# Patient Record
Sex: Male | Born: 1945 | Race: White | Hispanic: No | Marital: Married | Smoking: Never smoker
Health system: Southern US, Community
[De-identification: ages and names within clinical notes are randomized; demographics above are authoritative.]

## PROBLEM LIST (undated history)

## (undated) DIAGNOSIS — T4145XA Adverse effect of unspecified anesthetic, initial encounter: Secondary | ICD-10-CM

## (undated) DIAGNOSIS — K219 Gastro-esophageal reflux disease without esophagitis: Secondary | ICD-10-CM

## (undated) DIAGNOSIS — T8859XA Other complications of anesthesia, initial encounter: Secondary | ICD-10-CM

## (undated) DIAGNOSIS — G8929 Other chronic pain: Secondary | ICD-10-CM

## (undated) DIAGNOSIS — M542 Cervicalgia: Secondary | ICD-10-CM

## (undated) DIAGNOSIS — R519 Headache, unspecified: Secondary | ICD-10-CM

## (undated) DIAGNOSIS — R1032 Left lower quadrant pain: Secondary | ICD-10-CM

## (undated) DIAGNOSIS — K449 Diaphragmatic hernia without obstruction or gangrene: Secondary | ICD-10-CM

## (undated) DIAGNOSIS — Z9889 Other specified postprocedural states: Secondary | ICD-10-CM

## (undated) DIAGNOSIS — L570 Actinic keratosis: Secondary | ICD-10-CM

## (undated) DIAGNOSIS — K760 Fatty (change of) liver, not elsewhere classified: Secondary | ICD-10-CM

## (undated) DIAGNOSIS — G4730 Sleep apnea, unspecified: Secondary | ICD-10-CM

## (undated) DIAGNOSIS — I4891 Unspecified atrial fibrillation: Secondary | ICD-10-CM

## (undated) DIAGNOSIS — J302 Other seasonal allergic rhinitis: Secondary | ICD-10-CM

## (undated) DIAGNOSIS — C4492 Squamous cell carcinoma of skin, unspecified: Secondary | ICD-10-CM

## (undated) DIAGNOSIS — T7840XA Allergy, unspecified, initial encounter: Secondary | ICD-10-CM

## (undated) DIAGNOSIS — M1990 Unspecified osteoarthritis, unspecified site: Secondary | ICD-10-CM

## (undated) DIAGNOSIS — R112 Nausea with vomiting, unspecified: Secondary | ICD-10-CM

## (undated) HISTORY — DX: Diaphragmatic hernia without obstruction or gangrene: K44.9

## (undated) HISTORY — DX: Other chronic pain: G89.29

## (undated) HISTORY — DX: Fatty (change of) liver, not elsewhere classified: K76.0

## (undated) HISTORY — PX: ESOPHAGOGASTRODUODENOSCOPY: SHX1529

## (undated) HISTORY — DX: Gastro-esophageal reflux disease without esophagitis: K21.9

## (undated) HISTORY — PX: COLONOSCOPY: SHX174

## (undated) HISTORY — DX: Left lower quadrant pain: R10.32

## (undated) HISTORY — DX: Unspecified osteoarthritis, unspecified site: M19.90

## (undated) HISTORY — DX: Actinic keratosis: L57.0

## (undated) HISTORY — DX: Allergy, unspecified, initial encounter: T78.40XA

## (undated) HISTORY — PX: STOMACH SURGERY: SHX791

## (undated) HISTORY — DX: Other seasonal allergic rhinitis: J30.2

## (undated) HISTORY — PX: OTHER SURGICAL HISTORY: SHX169

## (undated) HISTORY — DX: Cervicalgia: M54.2

## (undated) HISTORY — DX: Unspecified atrial fibrillation: I48.91

---

## 2006-05-13 HISTORY — PX: BACK SURGERY: SHX140

## 2010-05-13 HISTORY — PX: HERNIA REPAIR: SHX51

## 2014-06-14 DIAGNOSIS — G8929 Other chronic pain: Secondary | ICD-10-CM

## 2014-06-14 DIAGNOSIS — M542 Cervicalgia: Secondary | ICD-10-CM

## 2014-06-14 DIAGNOSIS — J302 Other seasonal allergic rhinitis: Secondary | ICD-10-CM

## 2014-06-14 DIAGNOSIS — K219 Gastro-esophageal reflux disease without esophagitis: Secondary | ICD-10-CM

## 2014-06-14 HISTORY — DX: Other chronic pain: G89.29

## 2014-06-14 HISTORY — DX: Other seasonal allergic rhinitis: J30.2

## 2014-06-14 HISTORY — DX: Gastro-esophageal reflux disease without esophagitis: K21.9

## 2017-07-11 DIAGNOSIS — C4492 Squamous cell carcinoma of skin, unspecified: Secondary | ICD-10-CM

## 2017-07-11 HISTORY — DX: Squamous cell carcinoma of skin, unspecified: C44.92

## 2017-07-15 DIAGNOSIS — Z860101 Personal history of adenomatous and serrated colon polyps: Secondary | ICD-10-CM | POA: Insufficient documentation

## 2017-07-15 DIAGNOSIS — Z86010 Personal history of colonic polyps: Secondary | ICD-10-CM | POA: Insufficient documentation

## 2017-07-15 DIAGNOSIS — R1032 Left lower quadrant pain: Secondary | ICD-10-CM | POA: Insufficient documentation

## 2017-07-15 HISTORY — DX: Left lower quadrant pain: R10.32

## 2017-09-09 DIAGNOSIS — K449 Diaphragmatic hernia without obstruction or gangrene: Secondary | ICD-10-CM

## 2017-09-09 HISTORY — DX: Diaphragmatic hernia without obstruction or gangrene: K44.9

## 2017-09-11 ENCOUNTER — Other Ambulatory Visit: Payer: Self-pay | Admitting: Nurse Practitioner

## 2017-09-11 DIAGNOSIS — R1032 Left lower quadrant pain: Secondary | ICD-10-CM

## 2017-09-12 DIAGNOSIS — C4442 Squamous cell carcinoma of skin of scalp and neck: Secondary | ICD-10-CM

## 2017-09-12 HISTORY — DX: Squamous cell carcinoma of skin of scalp and neck: C44.42

## 2017-09-19 ENCOUNTER — Ambulatory Visit
Admission: RE | Admit: 2017-09-19 | Discharge: 2017-09-19 | Disposition: A | Discharge status: Home / Self Care | Payer: Medicare HMO | Source: Ambulatory Visit | Attending: Nurse Practitioner | Admitting: Nurse Practitioner

## 2017-09-19 DIAGNOSIS — K5730 Diverticulosis of large intestine without perforation or abscess without bleeding: Secondary | ICD-10-CM | POA: Insufficient documentation

## 2017-09-19 DIAGNOSIS — R1032 Left lower quadrant pain: Secondary | ICD-10-CM | POA: Insufficient documentation

## 2017-09-19 DIAGNOSIS — I700 Atherosclerosis of aorta: Secondary | ICD-10-CM | POA: Insufficient documentation

## 2017-09-19 DIAGNOSIS — K449 Diaphragmatic hernia without obstruction or gangrene: Secondary | ICD-10-CM | POA: Insufficient documentation

## 2017-09-19 DIAGNOSIS — R932 Abnormal findings on diagnostic imaging of liver and biliary tract: Secondary | ICD-10-CM | POA: Insufficient documentation

## 2017-09-19 DIAGNOSIS — K760 Fatty (change of) liver, not elsewhere classified: Secondary | ICD-10-CM | POA: Insufficient documentation

## 2017-09-19 MED ORDER — IOPAMIDOL (ISOVUE-300) INJECTION 61%
100.0000 mL | Freq: Once | INTRAVENOUS | Status: AC | PRN
  Administered 2017-09-19: 100 mL via INTRAVENOUS

## 2017-10-01 ENCOUNTER — Other Ambulatory Visit: Payer: Self-pay | Admitting: Student

## 2017-10-01 ENCOUNTER — Ambulatory Visit
Admission: RE | Admit: 2017-10-01 | Discharge: 2017-10-01 | Disposition: A | Discharge status: Home / Self Care | Payer: Medicare HMO | Source: Ambulatory Visit | Attending: Student | Admitting: Student

## 2017-10-01 DIAGNOSIS — R600 Localized edema: Secondary | ICD-10-CM

## 2017-10-28 ENCOUNTER — Encounter: Payer: Medicare HMO | Attending: Nurse Practitioner | Admitting: Dietician

## 2017-10-28 VITALS — Ht 72.0 in | Wt 201.5 lb

## 2017-10-28 DIAGNOSIS — Z713 Dietary counseling and surveillance: Secondary | ICD-10-CM | POA: Insufficient documentation

## 2017-10-28 DIAGNOSIS — K760 Fatty (change of) liver, not elsewhere classified: Secondary | ICD-10-CM | POA: Diagnosis not present

## 2017-10-28 NOTE — Progress Notes (Signed)
Medical Nutrition Therapy: Visit start time: 1030  end time: 1130  Assessment:  Diagnosis: fatty liver Past medical history: GERD Psychosocial issues/ stress concerns: none  Preferred learning method:  Nicki [place] . Hands-on   Current weight: 201.5lbs Height: 6'0" Medications, supplements: reconciled list in medical record  Progress and evaluation: Patient reports making some significant diet changes since diagnosis of fatty liver; has reduced cheese intake, along with sweets, processed foods, and Gatorade. He limits red meats, eats meatless meals regularly. He has lost 10-12lbs towards goal of losing 20lbs.     Physical activity: yardwork, stays active generally; some walking but limited due to knee pain.   Dietary Intake:  Usual eating pattern includes 3 meals and 1-2 snacks per day. Dining out frequency: 1 meals per week.  Breakfast: oatmeal 2/3 cup skim milk fresh fruit peach, berries, banana, yogurt; coffee no sugar Snack: none Lunch: orange, 1/2 apple, grapes, berries; 1/2 oz cheese, multigrain crackers Snack: none Supper: chicken skinless, broccoli, salad. Not always meat potato, veg. Fish. Very little red meat. Ground [place]. Rarely beef burger or roast beef.  Snack: nuts/ seeds; yogurt with fruit Beverages: water, coffee, occasional sports drink  Nutrition Care Education: Topics covered: weight control for fatty liver Weight control: benefits of weight control, determining reasonable weight goal, calore control through low fat ans low sugar food choices, portion control,  Mediterranean diet:  Benefits of plant-based diet, daily protein needs (86grams from all sources), healthy vs. Unhealthy fats, whole grains and limiting processed grains and sugars.  Nutritional Diagnosis:  [place]ley-1.4 Altered GI function As related to weight gain and excesive carbohydrate intake.  As evidenced by fatty liver.  Intervention: Instruction and discussion as noted above.   Patient has made multiple  positive changes already with support from his wife.   Goal is to continue with his current eating pattern.   No follow-up needed at this time; patient will call as needed with any questions.   Education Materials given:  Marland Kitchen Mediterranean Diet ([place]) . Goals/ instructions  Learner/ who was taught:  . Patient  . Spouse/ partner  Level of understanding: Marland Kitchen Verbalizes/ demonstrates competency   Demonstrated degree of understanding via:   Teach back Learning barriers: . None   Willingness to learn/ readiness for change: . Eager, change in progress  Monitoring and Evaluation:  Dietary intake, exercise, and body weight      follow up: prn

## 2017-10-28 NOTE — Patient Instructions (Signed)
   Continue with current healthy food choices and eating pattern, great job!

## 2018-04-05 DIAGNOSIS — K760 Fatty (change of) liver, not elsewhere classified: Secondary | ICD-10-CM | POA: Insufficient documentation

## 2018-04-05 HISTORY — DX: Fatty (change of) liver, not elsewhere classified: K76.0

## 2018-04-13 ENCOUNTER — Ambulatory Visit (INDEPENDENT_AMBULATORY_CARE_PROVIDER_SITE_OTHER): Payer: Medicare HMO | Admitting: Urology

## 2018-04-13 ENCOUNTER — Encounter: Payer: Self-pay | Admitting: Urology

## 2018-04-13 VITALS — BP 115/73 | HR 87 | Ht 72.0 in | Wt 186.0 lb

## 2018-04-13 DIAGNOSIS — N400 Enlarged prostate without lower urinary tract symptoms: Secondary | ICD-10-CM

## 2018-04-13 DIAGNOSIS — R350 Frequency of micturition: Secondary | ICD-10-CM

## 2018-04-13 LAB — URINALYSIS, COMPLETE
BILIRUBIN UA: NEGATIVE
GLUCOSE, UA: NEGATIVE
Ketones, UA: NEGATIVE
LEUKOCYTES UA: NEGATIVE
Nitrite, UA: NEGATIVE
PH UA: 5.5 (ref 5.0–7.5)
PROTEIN UA: NEGATIVE
RBC, UA: NEGATIVE
Specific Gravity, UA: 1.02 (ref 1.005–1.030)
UUROB: 0.2 mg/dL (ref 0.2–1.0)

## 2018-04-13 LAB — MICROSCOPIC EXAMINATION
EPITHELIAL CELLS (NON RENAL): NONE SEEN /HPF (ref 0–10)
RBC, UA: NONE SEEN /hpf (ref 0–2)

## 2018-04-13 LAB — BLADDER SCAN AMB NON-IMAGING

## 2018-04-13 MED ORDER — MIRABEGRON ER 50 MG PO TB24: 50.0000 mg | ORAL_TABLET | Freq: Every day | ORAL | 11 refills | Status: DC

## 2018-04-13 NOTE — Progress Notes (Signed)
04/13/2018 10:16 AM   Donald Barry 1945-12-09 [id number]  Referring provider: Derinda Late, MD [phone] [facility] [place], [place] [address]  Chief Complaint  Patient presents with  . Benign Prostatic Hypertrophy    New Patient    HPI: I was consulted to assess the patient's frequent bladder both day and night.  He voids every 1 hour but cannot hold it for 2 hours.  Gets up every hour at night.  He has a very urgent bladder and leaked twice recently and does not wear a pad.  He does not have ankle edema.  He does not take steroids or Lasix.  His flow is reasonable.  10 minutes later he can double void a small amount.  He generally does feel empty.  He took Flomax recently for 2 weeks with no benefit  He has had low back surgery.  He denies a history of kidney stones and previous GU surgery.  No blood in the urine.  He has never smoked.  Modifying factors: There are no other modifying factors  Associated signs and symptoms: There are no other associated signs and symptoms Aggravating and relieving factors: There are no other aggravating or relieving factors Severity: Moderate Duration: Persistent  PMH: Past Medical History:  Diagnosis Date  . Allergic rhinitis, seasonal 06/14/2014  . Chronic neck pain 06/14/2014  . Gastroesophageal reflux disease without esophagitis 06/14/2014  . Hepatic steatosis 04/05/2018  . Hiatal hernia 09/09/2017  . Intermittent left lower quadrant abdominal pain 07/15/2017    Surgical History: Past Surgical History:  Procedure Laterality Date  . BACK SURGERY  2008   L4, L5  . HERNIA REPAIR  2012    Home Medications:  Allergies as of 04/13/2018      Reactions   Tape Dermatitis   Skin blistered Skin blistered   Shellfish Allergy Diarrhea, Nausea And Vomiting, Nausea Only, Other (See Comments)   Onion    diarrhea   Codeine Nausea Only      Medication List        Accurate as of 04/13/18 10:16  AM. Always use your most recent med list.          aspirin EC 81 MG tablet Take by mouth.   fluticasone 50 MCG/ACT nasal spray Commonly known as:  FLONASE Place into the nose.   MUCINEX DM 30-600 MG Tb12 Take by mouth.       Allergies:  Allergies  Allergen Reactions  . Tape Dermatitis    Skin blistered Skin blistered   . Shellfish Allergy Diarrhea, Nausea And Vomiting, Nausea Only and Other (See Comments)  . Onion     diarrhea  . Codeine Nausea Only    Family History: Family History  Problem Relation Age of Onset  . Prostate cancer Neg Hx   . Kidney cancer Neg Hx     Social History:  reports that he has never smoked. He has never used smokeless tobacco. He reports that he does not drink alcohol or use drugs.  ROS: UROLOGY Frequent Urination?: Yes Hard to postpone urination?: Yes Burning/pain with urination?: Yes Get up at night to urinate?: Yes Leakage of urine?: Yes Urine stream starts and stops?: No Trouble starting stream?: No Do you have to strain to urinate?: No Blood in urine?: No Urinary tract infection?: No Sexually transmitted disease?: No Injury to kidneys or bladder?: No Painful intercourse?: No Weak stream?: No Erection problems?: No Penile pain?: No  Gastrointestinal Nausea?: No  Vomiting?: No Indigestion/heartburn?: No Diarrhea?: No Constipation?: No  Constitutional Fever: No Night sweats?: No Weight loss?: No Fatigue?: Yes  Skin Skin rash/lesions?: No Itching?: No  Eyes Blurred vision?: No Double vision?: No  Ears/Nose/Throat Sore throat?: No Sinus problems?: Yes  Hematologic/Lymphatic Swollen glands?: No Easy bruising?: No  Cardiovascular Leg swelling?: No Chest pain?: No  Respiratory Cough?: No Shortness of breath?: No  Endocrine Excessive thirst?: No  Musculoskeletal Back pain?: No Joint pain?: Yes  Neurological Headaches?: No Dizziness?: No  Psychologic Depression?: No Anxiety?:  No  Physical Exam: BP 115/73   Pulse 87   Ht 6' (1.829 m)   Wt 84.4 kg   BMI 25.23 kg/m   Constitutional:  Alert and oriented, No acute distress. HEENT: [place] AT, moist mucus membranes.  Trachea midline, no masses. Cardiovascular: No clubbing, cyanosis, or edema. Respiratory: Normal respiratory effort, no increased work of breathing. GI: Abdomen is soft, nontender, nondistended, no abdominal masses GU: Male genitalia normal; 50 g benign prostate Skin: No rashes, bruises or suspicious lesions. Lymph: No cervical or inguinal adenopathy. Neurologic: Grossly intact, no focal deficits, moving all 4 extremities. Psychiatric: Normal mood and affect.  Laboratory Data: No results found for: WBC, HGB, HCT, MCV, PLT  No results found for: CREATININE  No results found for: PSA  No results found for: TESTOSTERONE  No results found for: HGBA1C  Urinalysis No results found for: COLORURINE, APPEARANCEUR, LABSPEC, PHURINE, GLUCOSEU, HGBUR, BILIRUBINUR, KETONESUR, PROTEINUR, UROBILINOGEN, NITRITE, LEUKOCYTESUR  Pertinent Imaging:   Assessment & Plan: Patient has mild decrease in flow.  Her residual today was 109 mL which may be a false positive.  He does not respond to Flomax.  He has significant frequency both day and night.  The role of Myrbetriq samples and prescription and keep an eye on his residual was discussed.  He had no blood in the urine today and in the future may or may not need cystoscopy.  Urine sent for culture  1. Benign prostatic hyperplasia, unspecified whether lower urinary tract symptoms present  - Urinalysis, Complete - BLADDER SCAN AMB NON-IMAGING   No follow-ups on file.  Reece Packer, MD  [facility] [address], [place], [place] [address] [phone]

## 2018-04-17 LAB — CULTURE, URINE COMPREHENSIVE

## 2018-05-04 ENCOUNTER — Telehealth: Payer: Self-pay | Admitting: Urology

## 2018-05-04 NOTE — Telephone Encounter (Signed)
Patient called the office today to report that the Myrbetriq is not working for him.  He is inquiring about the next step.  He has a follow up scheduled for 05/18/18.  He can be reached at [phone].

## 2018-05-08 NOTE — Telephone Encounter (Signed)
Detrol LA  4mg 

## 2018-05-18 ENCOUNTER — Encounter: Payer: Self-pay | Admitting: Urology

## 2018-05-18 ENCOUNTER — Ambulatory Visit (INDEPENDENT_AMBULATORY_CARE_PROVIDER_SITE_OTHER): Payer: Medicare HMO | Admitting: Urology

## 2018-05-18 VITALS — BP 131/82 | HR 78 | Ht 72.0 in | Wt 187.0 lb

## 2018-05-18 DIAGNOSIS — R350 Frequency of micturition: Secondary | ICD-10-CM | POA: Diagnosis not present

## 2018-05-18 LAB — URINALYSIS, COMPLETE
Bilirubin, UA: NEGATIVE
Glucose, UA: NEGATIVE
Ketones, UA: NEGATIVE
LEUKOCYTES UA: NEGATIVE
NITRITE UA: NEGATIVE
Protein, UA: NEGATIVE
RBC, UA: NEGATIVE
SPEC GRAV UA: 1.01 (ref 1.005–1.030)
Urobilinogen, Ur: 0.2 mg/dL (ref 0.2–1.0)
pH, UA: 5.5 (ref 5.0–7.5)

## 2018-05-18 LAB — MICROSCOPIC EXAMINATION
Epithelial Cells (non renal): NONE SEEN /hpf (ref 0–10)
RBC MICROSCOPIC, UA: NONE SEEN /HPF (ref 0–2)

## 2018-05-18 LAB — BLADDER SCAN AMB NON-IMAGING

## 2018-05-18 MED ORDER — OXYBUTYNIN CHLORIDE ER 10 MG PO TB24: 10.0000 mg | ORAL_TABLET | Freq: Every day | ORAL | 11 refills | Status: DC

## 2018-05-18 NOTE — Progress Notes (Signed)
05/18/2018 8:53 AM   Donald Barry 1946-01-19 [id number]  Referring provider: Derinda Late, MD [phone] [facility] [place], [place] [address]  Chief Complaint  Patient presents with  . Benign Prostatic Hypertrophy    5 week follow up  . Urinary Frequency    HPI: I was consulted to assess the patient's frequent bladder both day and night.  He voids every 1 hour but cannot hold it for 2 hours.  Gets up every hour at night.  He has a very urgent bladder and leaked twice recently and does not wear a pad.  He does not have ankle edema.  He does not take steroids or Lasix.  His flow is reasonable.  10 minutes later he can double void a small amount.  He generally does feel empty.  He took Flomax recently for 2 weeks with no benefit   Patient has mild decrease in flow.  Her residual today was 109 mL which may be a false positive.  He does not respond to Flomax.  He has significant frequency both day and night.  The role of Myrbetriq samples and prescription and keep an eye on his residual was discussed.  He had no blood in the urine today and in the future may or may not need cystoscopy.  Urine sent for culture  Today Patient most bothered by nighttime frequency every 45 to 60 minutes but also uses a CPAP.  He gets up because of a strong desire to urinate.  He cannot hold it for 2 hours during the day.  He failed Myrbetriq.  Clinically not infected.  PMH: Past Medical History:  Diagnosis Date  . Allergic rhinitis, seasonal 06/14/2014  . Chronic neck pain 06/14/2014  . Gastroesophageal reflux disease without esophagitis 06/14/2014  . Hepatic steatosis 04/05/2018  . Hiatal hernia 09/09/2017  . Intermittent left lower quadrant abdominal pain 07/15/2017    Surgical History: Past Surgical History:  Procedure Laterality Date  . BACK SURGERY  2008   L4, L5  . HERNIA REPAIR  2012    Home Medications:  Allergies as of 05/18/2018      Reactions   Tape Dermatitis   Skin blistered Skin blistered   Shellfish Allergy Diarrhea, Nausea And Vomiting, Nausea Only, Other (See Comments)   Onion    diarrhea   Codeine Nausea Only      Medication List       Accurate as of May 18, 2018  8:53 AM. Always use your most recent med list.        aspirin EC 81 MG tablet Take by mouth.   fluticasone 50 MCG/ACT nasal spray Commonly known as:  FLONASE Place into the nose.   MUCINEX DM 30-600 MG Tb12 Take by mouth.   omeprazole 40 MG capsule Commonly known as:  PRILOSEC Take 40 mg by mouth daily.   oxybutynin 10 MG 24 hr tablet Commonly known as:  DITROPAN-XL Take 1 tablet (10 mg total) by mouth daily.       Allergies:  Allergies  Allergen Reactions  . Tape Dermatitis    Skin blistered Skin blistered   . Shellfish Allergy Diarrhea, Nausea And Vomiting, Nausea Only and Other (See Comments)  . Onion     diarrhea  . Codeine Nausea Only    Family History: Family History  Problem Relation Age of Onset  . Prostate cancer Neg Hx   . Kidney cancer Neg Hx     Social History:  reports that he has never smoked. He has never used smokeless tobacco. He reports that he does not drink alcohol or use drugs.  ROS: UROLOGY Frequent Urination?: Yes Hard to postpone urination?: Yes Burning/pain with urination?: No Get up at night to urinate?: Yes Leakage of urine?: No Urine stream starts and stops?: No Trouble starting stream?: No Do you have to strain to urinate?: No Blood in urine?: No Urinary tract infection?: No Sexually transmitted disease?: No Injury to kidneys or bladder?: No Painful intercourse?: No Weak stream?: No Erection problems?: No Penile pain?: No  Gastrointestinal Nausea?: No Vomiting?: No Indigestion/heartburn?: No Diarrhea?: No Constipation?: No  Constitutional Fever: No Night sweats?: No Weight loss?: No Fatigue?: No  Skin Skin rash/lesions?: No Itching?: No  Eyes Blurred vision?:  No Double vision?: No  Ears/Nose/Throat Sore throat?: No Sinus problems?: No  Hematologic/Lymphatic Swollen glands?: No Easy bruising?: No  Cardiovascular Leg swelling?: No Chest pain?: No  Respiratory Cough?: No Shortness of breath?: No  Endocrine Excessive thirst?: No  Musculoskeletal Back pain?: No Joint pain?: No  Neurological Headaches?: No Dizziness?: No  Psychologic Depression?: No Anxiety?: No  Physical Exam: BP 131/82 (BP Location: Left Arm, Patient Position: Sitting)   Pulse 78   Ht 6' (1.829 m)   Wt 187 lb (84.8 kg)   BMI 25.36 kg/m    Laboratory Data: No results found for: WBC, HGB, HCT, MCV, PLT  No results found for: CREATININE  No results found for: PSA  No results found for: TESTOSTERONE  No results found for: HGBA1C  Urinalysis    Component Value Date/Time   APPEARANCEUR Clear 04/13/2018 0941   GLUCOSEU Negative 04/13/2018 0941   BILIRUBINUR Negative 04/13/2018 0941   PROTEINUR Negative 04/13/2018 0941   NITRITE Negative 04/13/2018 0941   LEUKOCYTESUR Negative 04/13/2018 0941    Pertinent Imaging:   Assessment & Plan: The patient will try oxybutynin.  I will perform cystoscopy next visits due to refractory symptoms.  If he does not respond I will offer desmopressin at least for the nighttime symptoms.  I am not certain if urodynamics or surgical options are considerations at this time  His residual today was 16 mL and no blood in urine  1. Urinary frequency  - BLADDER SCAN AMB NON-IMAGING - Urinalysis, Complete   Return in about 6 weeks (around 06/29/2018) for cysto.  Reece Packer, MD  [facility] [address] [place], [place] [address] [phone]

## 2018-06-22 ENCOUNTER — Ambulatory Visit (INDEPENDENT_AMBULATORY_CARE_PROVIDER_SITE_OTHER): Payer: Medicare HMO | Admitting: Urology

## 2018-06-22 ENCOUNTER — Encounter: Payer: Self-pay | Admitting: Urology

## 2018-06-22 VITALS — BP 120/74 | HR 80 | Ht 72.0 in | Wt 191.2 lb

## 2018-06-22 DIAGNOSIS — R350 Frequency of micturition: Secondary | ICD-10-CM

## 2018-06-22 DIAGNOSIS — R3129 Other microscopic hematuria: Secondary | ICD-10-CM

## 2018-06-22 LAB — URINALYSIS, COMPLETE
Bilirubin, UA: NEGATIVE
Glucose, UA: NEGATIVE
Ketones, UA: NEGATIVE
Leukocytes, UA: NEGATIVE
Nitrite, UA: NEGATIVE
Protein, UA: NEGATIVE
RBC, UA: NEGATIVE
Specific Gravity, UA: 1.015 (ref 1.005–1.030)
Urobilinogen, Ur: 0.2 mg/dL (ref 0.2–1.0)
pH, UA: 5.5 (ref 5.0–7.5)

## 2018-06-22 NOTE — Progress Notes (Signed)
06/22/2018 8:25 AM   Donald Barry [birth year] [id number]  Referring provider: Derinda Late, MD [phone] [facility] [place], [place] [address]  Chief Complaint  Patient presents with  . Cysto    HPI: I was consulted to assess the patient's frequent bladder both day and night. He voids every 1 hour but cannot hold it for 2 hours. Gets up every hour at night. He has a very urgent bladder and leaked twice recently and does not wear a pad. He does not have ankle edema. He does not take steroids or Lasix. His flow is reasonable. 10 minutes later he can double void a small amount. He generally does feel empty. He took Flomax recently for 2 weeks with no benefit   Patient has mild decrease in flow. Her residual today was 109 mL which may be a false positive. He does not respond to Flomax. He has significant frequency both day and night. The role of Myrbetriq samples and prescription and keep an eye on his residual was discussed. He had no blood in the urine today and in the future may or may not need cystoscopy. Urine sent for culture  Patient most bothered by nighttime frequency every 45 to 60 minutes but also uses a CPAP.  He gets up because of a strong desire to urinate.  He cannot hold it for 2 hours during the day.  He failed Myrbetriq.  Clinically not infected.  The patient will try oxybutynin.  I will perform cystoscopy next visits due to refractory symptoms.  If he does not respond I will offer desmopressin at least for the nighttime symptoms.  I am not certain if urodynamics or surgical options are considerations at this time  Today Patient having less urgency on oxybutynin but still less to void every 45 to 60 minutes at night.  He has never smoked  Patient underwent cystoscopy utilizing sterile technique after consent given.  The penile bulbar urethra normal.  He bilobar enlargement of prostate.  Initially I thought he  may have had some elevation near the left ureteral orifice and trigone but then it appeared to normalize.  He had inflammation of the left posterior wall and laterally with a small nodule almost like the tip of one's fifth finger.  I did have some difficulty standing around the entire bladder but he did not see any other obvious abnormalities.  I reviewed his chart and he had a CT scan in May 2019 and he had some diverticular disease with some sigmoid colon thickening and a normal kidneys and bladder.     PMH: Past Medical History:  Diagnosis Date  . Allergic rhinitis, seasonal 06/14/2014  . Chronic neck pain 06/14/2014  . Gastroesophageal reflux disease without esophagitis 06/14/2014  . Hepatic steatosis 04/05/2018  . Hiatal hernia 09/09/2017  . Intermittent left lower quadrant abdominal pain 07/15/2017    Surgical History: Past Surgical History:  Procedure Laterality Date  . BACK SURGERY  2008   L4, L5  . HERNIA REPAIR  2012    Home Medications:  Allergies as of 06/22/2018      Reactions   Tape Dermatitis   Skin blistered Skin blistered   Shellfish Allergy Diarrhea, Nausea And Vomiting, Nausea Only, Other (See Comments)   Onion    diarrhea   Codeine Nausea Only      Medication List       Accurate as of June 22, 2018  8:25 AM. Always use your  most recent med list.        aspirin EC 81 MG tablet Take by mouth.   fluticasone 50 MCG/ACT nasal spray Commonly known as:  FLONASE Place into the nose.   MUCINEX DM 30-600 MG Tb12 Take by mouth.   omeprazole 40 MG capsule Commonly known as:  PRILOSEC Take 40 mg by mouth daily.   oxybutynin 10 MG 24 hr tablet Commonly known as:  DITROPAN-XL Take 1 tablet (10 mg total) by mouth daily.       Allergies:  Allergies  Allergen Reactions  . Tape Dermatitis    Skin blistered Skin blistered   . Shellfish Allergy Diarrhea, Nausea And Vomiting, Nausea Only and Other (See Comments)  . Onion     diarrhea  . Codeine  Nausea Only    Family History: Family History  Problem Relation Age of Onset  . Prostate cancer Neg Hx   . Kidney cancer Neg Hx     Social History:  reports that he has never smoked. He has never used smokeless tobacco. He reports that he does not drink alcohol or use drugs.  ROS:                                        Physical Exam: There were no vitals taken for this visit.  Constitutional:  Alert and oriented, No acute distress.  Laboratory Data: No results found for: WBC, HGB, HCT, MCV, PLT  No results found for: CREATININE  No results found for: PSA  No results found for: TESTOSTERONE  No results found for: HGBA1C  Urinalysis    Component Value Date/Time   APPEARANCEUR Clear 05/18/2018 0831   GLUCOSEU Negative 05/18/2018 0831   BILIRUBINUR Negative 05/18/2018 0831   PROTEINUR Negative 05/18/2018 0831   NITRITE Negative 05/18/2018 0831   LEUKOCYTESUR Negative 05/18/2018 0831    Pertinent Imaging:   Assessment & Plan: A picture was drawn.  I would like for the patient to have another CT scan.  He understands that he will need a bladder biopsy under anesthesia.  The area in question could represent a malignancy but the bladder almost looked inflamed or edematous in the area as well.  He does not have other symptoms of a fistula.  He has had diverticulitis in the past and will be interesting if this is related.  1. Urinary frequency  - Urinalysis, Complete   No follow-ups on file.  Reece Packer, MD  [facility] [address] [place], [place] [address] [phone]

## 2018-06-22 NOTE — Patient Instructions (Signed)
Hematuria, Adult  Hematuria is blood in the urine. Blood may be visible in the urine, or it may be identified with a test. This condition can be caused by infections of the bladder, urethra, kidney, or prostate. Other possible causes include:   Kidney stones.   Cancer of the urinary tract.   Too much calcium in the urine.   Conditions that are passed from parent to child (inherited conditions).   Exercise that requires a lot of energy.  Infections can usually be treated with medicine, and a kidney stone usually will pass through your urine. If neither of these is the cause of your hematuria, more tests may be needed to identify the cause of your symptoms.  It is very important to tell your health care provider about any blood in your urine, even if it is painless or the blood stops without treatment. Blood in the urine, when it happens and then stops and then happens again, can be a symptom of a very serious condition, including cancer. There is no pain in the initial stages of many urinary cancers.  Follow these instructions at home:  Medicines   Take over-the-counter and prescription medicines only as told by your health care provider.   If you were prescribed an antibiotic medicine, take it as told by your health care provider. Do not stop taking the antibiotic even if you start to feel better.  Eating and drinking   Drink enough fluid to keep your urine clear or pale yellow. It is recommended that you drink 3-4 quarts (2.8-3.8 L) a day. If you have been diagnosed with an infection, it is recommended that you drink cranberry juice in addition to large amounts of water.   Avoid caffeine, tea, and carbonated beverages. These tend to irritate the bladder.   Avoid alcohol because it may irritate the prostate (men).  General instructions   If you have been diagnosed with a kidney stone, follow your health care provider's instructions about straining your urine to catch the stone.   Empty your bladder  often. Avoid holding urine for long periods of time.   If you are male:  ? After a bowel movement, wipe from front to back and use each piece of toilet paper only once.  ? Empty your bladder before and after sex.   Pay attention to any changes in your symptoms. Tell your health care provider about any changes or any new symptoms.   It is your responsibility to get your test results. Ask your health care provider, or the department performing the test, when your results will be ready.   Keep all follow-up visits as told by your health care provider. This is important.  Contact a health care provider if:   You develop back pain.   You have a fever.   You have nausea or vomiting.   Your symptoms do not improve after 3 days.   Your symptoms get worse.  Get help right away if:   You develop severe vomiting and are unable take medicine without vomiting.   You develop severe pain in your back or abdomen even though you are taking medicine.   You pass a large amount of blood in your urine.   You pass blood clots in your urine.   You feel very weak or like you might faint.   You faint.  Summary   Hematuria is blood in the urine. It has many possible causes.   It is very important that you tell   your health care provider about any blood in your urine, even if it is painless or the blood stops without treatment.   Take over-the-counter and prescription medicines only as told by your health care provider.   Drink enough fluid to keep your urine clear or pale yellow.  This information is not intended to replace advice given to you by your health care provider. Make sure you discuss any questions you have with your health care provider.  Document Released: 04/29/2005 Document Revised: 06/01/2016 Document Reviewed: 06/01/2016  Elsevier Interactive Patient Education  2019 [employer]

## 2018-07-07 ENCOUNTER — Ambulatory Visit
Admission: RE | Admit: 2018-07-07 | Discharge: 2018-07-07 | Disposition: A | Discharge status: Home / Self Care | Payer: Medicare HMO | Source: Ambulatory Visit | Attending: Urology | Admitting: Urology

## 2018-07-07 DIAGNOSIS — R3129 Other microscopic hematuria: Secondary | ICD-10-CM | POA: Diagnosis present

## 2018-07-07 HISTORY — DX: Squamous cell carcinoma of skin, unspecified: C44.92

## 2018-07-07 LAB — POCT I-STAT CREATININE: Creatinine, Ser: 0.7 mg/dL (ref 0.61–1.24)

## 2018-07-07 MED ORDER — IOHEXOL 300 MG/ML  SOLN
125.0000 mL | Freq: Once | INTRAMUSCULAR | Status: AC | PRN
  Administered 2018-07-07: 125 mL via INTRAVENOUS

## 2018-07-13 DIAGNOSIS — I700 Atherosclerosis of aorta: Secondary | ICD-10-CM | POA: Insufficient documentation

## 2018-07-14 ENCOUNTER — Ambulatory Visit (INDEPENDENT_AMBULATORY_CARE_PROVIDER_SITE_OTHER): Payer: Medicare HMO | Admitting: Urology

## 2018-07-14 ENCOUNTER — Telehealth: Payer: Self-pay | Admitting: Radiology

## 2018-07-14 ENCOUNTER — Encounter: Payer: Self-pay | Admitting: Urology

## 2018-07-14 ENCOUNTER — Other Ambulatory Visit: Payer: Self-pay | Admitting: Radiology

## 2018-07-14 VITALS — BP 122/74 | HR 79 | Ht 72.0 in | Wt 187.0 lb

## 2018-07-14 DIAGNOSIS — N329 Bladder disorder, unspecified: Secondary | ICD-10-CM | POA: Diagnosis not present

## 2018-07-14 NOTE — Progress Notes (Signed)
   07/14/2018 3:56 PM   Donald Barry 1945/12/05 [id number]  Reason for visit: Follow up bladder lesion  HPI: I saw [name] in urology clinic today for follow-up of a bladder lesion.  He was previously followed by my partner Dr. Matilde Sprang for severe urinary symptoms.  He is a 73 year old healthy male who has a 72-month history of severe urinary symptoms including frequency every 30 minutes to an hour during the day and night with dysuria.  He was trialed on both oxybutynin and Myrbetriq by Dr. Matilde Sprang with no improvement in his symptoms.  He ultimately underwent a cystoscopy on 06/22/2018 which showed " inflammation of the left posterior wall with a small nodule" and Dr. Matilde Sprang recommended biopsy.  He also ordered a CT urogram which was performed on 07/07/2018 and showed no acute findings.  Prostate measured 34 g on that scan.  I discussed the risks and benefits of cystoscopy, bladder biopsy, and fulguration at length with the patient.  We discussed that the pathology would determine the next steps in management.  We also discussed that this procedure may not improve his severe urinary symptoms, and further work-up may be required in the future including possible urodynamics.  We also briefly touched on interstitial cystitis as a potential cause for his symptoms if biopsy is negative.  Schedule for cystoscopy, bladder biopsy, and fulguration  A total of 15 minutes were spent face-to-face with the patient, greater than 50% was spent in patient education, counseling, and coordination of care regarding bladder pain, cystoscopy and bladder biopsy.   [place], [facility] [address] [place], [place] [address] [phone]

## 2018-07-14 NOTE — Patient Instructions (Signed)
Bladder Biopsy  A bladder biopsy is a procedure to remove a small sample of tissue from the bladder. The procedure is done so that the tissue can be examined under a microscope. You may have a bladder biopsy to diagnose or rule out bladder cancer. During your bladder biopsy, your health care provider may insert a long, thin scope with a tiny lighted camera (cystoscope) into the tube that leads from your bladder to the outside of your body (urethra) and move it into your bladder. The cystoscope will allow your health care provider to examine the lining of the urethra and bladder and remove the tissue sample. If your health provider also needs to examine the tubes that connect the kidneys to the bladder (ureters), a longer tube (ureteroscope) may be used. Tell a health care provider about:  Any allergies you have.  All medicines you are taking, including vitamins, herbs, eye drops, creams, and over-the-counter medicines.  Any problems you or family members have had with anesthetic medicines.  Any blood disorders you have.  Any surgeries you have had.  Any medical conditions you have.  Whether you are pregnant or may be pregnant. What are the risks? Generally, this is a safe procedure. However, problems may occur, including:  Unusual bleeding.  Infection, especially a urinary tract infection (UTI).  Allergic reactions to medicines.  Damage to the surrounding organs, including the urethra, bladder, or ureters.  Abdominal pain.  Burning or pain during urination.  Narrowing of the urethra due to scar tissue.  Difficulty urinating due to swelling. What happens before the procedure?  You may be given antibiotic medicine to help prevent infection.  Ask your health care provider about: ? Changing or stopping your regular medicines. This is especially important if you are taking diabetes medicines or blood thinners. ? Taking medicines such as aspirin and ibuprofen. These medicines can  thin your blood. Do not take these medicines before your procedure if your health care provider instructs you not to.  Follow instructions from your health care provider about eating and drinking restrictions.  You may be asked to drink plenty of fluids.  You may be asked to urinate right before the procedure. You may have a urine sample taken for UTI testing.  Plan to have someone take you home from the hospital or clinic.  If you will be going home right after the procedure, plan to have someone with you for 24 hours. What happens during the procedure?  To lower your risk of infection: ? Your health care team will wash or sanitize their hands. ? Your skin will be washed with soap.  An IV tube may be inserted into one of your veins.  You may be given one or more of the following: ? A medicine to help you relax (sedative). ? A medicine to numb the opening of the urethra (local anesthetic). ? A medicine to make you fall asleep (general anesthetic). ? A medicine that is injected into your spine to numb the area below and slightly above the injection site (spinal anesthetic). ? A medicine that is injected into an area of your body to numb everything below the injection site (regional anesthetic).  You will lie on your back with your knees bent and spread apart.  The cystoscope or ureteroscope will be inserted into your urethra and guided into your bladder or ureters.  Your bladder may be slowly filled with germ-free (sterile) water. This will make it easier for your health care provider to view  the wall or lining of your bladder.  Small instruments will be inserted through the scope to collect a small tissue sample that will be examined under a microscope. The procedure may vary among health care providers and hospitals. What happens after the procedure?  You may be asked to empty your bladder, or your bladder may be emptied for you.  Your blood pressure, heart rate, breathing  rate, and blood oxygen level will be monitored until the medicines you were given wear off.  Do not drive for 24 hours if you received a sedative. This information is not intended to replace advice given to you by your health care provider. Make sure you discuss any questions you have with your health care provider. Document Released: 05/16/2015 Document Revised: 10/05/2015 Document Reviewed: 05/16/2015 Elsevier Interactive Patient Education  2019 [employer].   Interstitial Cystitis  Interstitial cystitis is inflammation of the bladder. This may cause pain in the bladder area as well as a frequent and urgent need to urinate. The bladder is a hollow organ in the lower part of the abdomen. It stores urine after the urine is made in the kidneys. The severity of interstitial cystitis can vary from person to person. You may have flare-ups, and then your symptoms may go away for a while. For many people, it becomes a long-term (chronic) problem. What are the causes? The cause of this condition is not known. What increases the risk? The following factors may make you more likely to develop this condition:  You are male.  You have fibromyalgia.  You have irritable bowel syndrome (IBS).  You have endometriosis. This condition may be aggravated by:  Stress.  Smoking.  Spicy foods. What are the signs or symptoms? Symptoms of interstitial cystitis vary, and they can change over time. Symptoms may include:  Discomfort or pain in the bladder area, which is in the lower abdomen. Pain can range from mild to severe. The pain may change in intensity as the bladder fills with urine or as it empties.  Pain in the pelvic area, between the hip bones.  An urgent need to urinate.  Frequent urination.  Pain during urination.  Pain during sex.  Blood in the urine. For women, symptoms often get worse during menstruation. How is this diagnosed? This condition is diagnosed based on your  symptoms, your medical history, and a physical exam. You may have tests to rule out other conditions, such as:  Urine tests.  Cystoscopy. For this test, a tool similar to a very thin telescope is used to look into your bladder.  Biopsy. This involves taking a sample of tissue from the bladder to be examined under a microscope. How is this treated? There is no cure for this condition, but treatment can help you control your symptoms. Work closely with your health care provider to find the most effective treatments for you. Treatment options may include:  Medicines to relieve pain and reduce how often you feel the need to urinate.  Learning ways to control when you urinate (bladder training).  Lifestyle changes, such as changing your diet or taking steps to control stress.  Using a device that provides electrical stimulation to your nerves, which can relieve pain (neuromodulation therapy). The device is placed on your back, where it blocks the nerves that cause you to feel pain in your bladder area.  A procedure that stretches your bladder by filling it with air or fluid.  Surgery. This is rare. It is only done for  extreme cases, if other treatments do not help. Follow these instructions at home: Bladder training   Use bladder training techniques as directed. Techniques may include: ? Urinating at scheduled times. ? Training yourself to delay urination. ? Doing exercises (Kegel exercises) to strengthen the muscles that control urine flow.  Keep a bladder diary. ? Write down the times that you urinate and any symptoms that you have. This can help you find out which foods, liquids, or activities make your symptoms worse. ? Use your bladder diary to schedule bathroom trips. If you are away from home, plan to be near a bathroom at each of your scheduled times.  Make sure that you urinate just before you leave the house and just before you go to bed. Eating and drinking  Make dietary  changes as recommended by your health care provider. You may need to avoid: ? Spicy foods. ? Foods that contain a lot of potassium.  Limit your intake of beverages that make you need to urinate. These include: ? Caffeinated beverages like soda, coffee, and tea. ? Alcohol. General instructions  Take over-the-counter and prescription medicines only as told by your health care provider.  Do not drink alcohol.  You can try a warm or cool compress over your bladder for comfort.  Avoid wearing tight clothing.  Do not use any products that contain nicotine or tobacco, such as cigarettes and e-cigarettes. If you need help quitting, ask your health care provider.  Keep all follow-up visits as told by your health care provider. This is important. Contact a health care provider if you have:  Symptoms that do not get better with treatment.  Pain or discomfort that gets worse.  More frequent urges to urinate.  A fever. Get help right away if:  You have no control over when you urinate. Summary  Interstitial cystitis is inflammation of the bladder.  This condition may cause pain in the bladder area as well as a frequent and urgent need to urinate.  You may have flare-ups of the condition, and then it may go away for a while. For many people, it becomes a long-term (chronic) problem.  There is no cure for interstitial cystitis, but treatment methods are available to control your symptoms. This information is not intended to replace advice given to you by your health care provider. Make sure you discuss any questions you have with your health care provider. Document Released: 12/29/2003 Document Revised: 03/24/2017 Document Reviewed: 03/24/2017 Elsevier Interactive Patient Education  2019 [employer].

## 2018-07-14 NOTE — Telephone Encounter (Signed)
Patient was given the [facility] Information form below as well as the Instructions for Pre-Admission Testing form & a map of [facility].   [place] [place], [place], [place] [address] Telephone: [phone] Fax: [phone]   Thank you for choosing [facility] for your upcoming surgery!  We are always here to assist in your urological needs.  Please read the following information with specific details for your upcoming appointments related to your surgery. Please contact Lumir Demetriou at [phone] Option 3 with any questions.  The Name of Your Surgery: Cystoscopy, bladder biopsy, fulgeration Your Surgery Date: 07/17/2018 Your Surgeon: Nickolas Madrid  Please call Same Day Surgery at [phone] between the hours of 1pm-3pm one day prior to your surgery. They will inform you of the time to arrive at Same Day Surgery which is located on the second floor of the [facility].   Please refer to the attached letter regarding instructions for Pre-Admission Testing. You will receive a call from the [facility] office regarding your appointment with them.  The Pre-Admission Testing office is located at [place], on the first floor of the [facility] [facility] in [place] (office is to the right as you enter through the [employer] of the [facility]). Please have all medications you are currently taking and your insurance card available.   Patient was advised to have nothing to eat or drink after midnight the night prior to surgery except that he may have only water until 2 hours before surgery with nothing to drink within 2 hours of surgery.  The patient states he currently takes Aspirin 81mg   & was informed to hold medication beginning immediately. Patient's questions were answered and he expressed understanding of these instructions.

## 2018-07-15 ENCOUNTER — Other Ambulatory Visit: Payer: Self-pay

## 2018-07-15 ENCOUNTER — Encounter
Admission: RE | Admit: 2018-07-15 | Discharge: 2018-07-15 | Disposition: A | Discharge status: Home / Self Care | Payer: Medicare HMO | Source: Ambulatory Visit | Attending: Urology | Admitting: Urology

## 2018-07-15 HISTORY — DX: Adverse effect of unspecified anesthetic, initial encounter: T41.45XA

## 2018-07-15 HISTORY — DX: Other complications of anesthesia, initial encounter: T88.59XA

## 2018-07-15 HISTORY — DX: Nausea with vomiting, unspecified: R11.2

## 2018-07-15 HISTORY — DX: Other specified postprocedural states: Z98.890

## 2018-07-15 NOTE — Patient Instructions (Addendum)
Your procedure is scheduled on: 07/17/18 Report to Day Surgery. [facility]OND FLOOR To find out your arrival time please call [phone] between 1PM - 3PM on 07/16/18.  Remember: Instructions that are not followed completely may result in serious medical risk,  up to and including death, or upon the discretion of your surgeon and anesthesiologist your  surgery may need to be rescheduled.     _X__ 1. Do not eat food after midnight the night before your procedure.                 No gum chewing or hard candies. You may drink clear liquids up to 2 hours                 before you are scheduled to arrive for your surgery- DO not drink clear                 liquids within 2 hours of the start of your surgery.                 Clear Liquids include:  water, apple juice without pulp, clear carbohydrate                 drink such as Clearfast of Gatorade, Black Coffee or Tea (Do not add                 anything to coffee or tea).  __X__2.  On the morning of surgery brush your teeth with toothpaste and water, you                may rinse your mouth with mouthwash if you wish.  Do not swallow any toothpaste of mouthwash.     _X__ 3.  No Alcohol for 24 hours before or after surgery.   _X__ 4.  Do Not Smoke or use e-cigarettes For 24 Hours Prior to Your Surgery.                 Do not use any chewable tobacco products for at least 6 hours prior to                 surgery.  ____  5.  Bring all medications with you on the day of surgery if instructed.   _X___  6.  Notify your doctor if there is any change in your medical condition      (cold, fever, infections).     Do not wear jewelry, make-up, hairpins, clips or nail polish. Do not wear lotions, powders, or perfumes. You may wear deodorant. Do not shave 48 hours prior to surgery. Men may shave face and neck. Do not bring valuables to the hospital.    [facility] is not responsible for any belongings or  valuables.  Contacts, dentures or bridgework may not be worn into surgery. Leave your suitcase in the car. After surgery it may be brought to your room. For patients admitted to the hospital, discharge time is determined by your treatment team.   Patients discharged the day of surgery will not be allowed to drive home.     __X__ Take these medicines the morning of surgery with A SIP OF WATER:    1. OMEPRAZOLE  2.   3.   4.  5.  6.  ____ Fleet Enema (as directed)   ____ Use CHG Soap as directed  ____ Use inhalers on the day of surgery  ____ Stop metformin 2 days prior to surgery  ____ Take 1/2 of usual insulin dose the night before surgery. No insulin the morning          of surgery.   __X__ Stop Coumadin/Plavix/aspirin on  ASPIRIN ALREADY STOPPED ____ Stop Anti-inflammatories on    ____ Stop supplements until after surgery.    _X___ Bring C-Pap to the hospital.

## 2018-07-16 ENCOUNTER — Encounter: Payer: Self-pay | Admitting: Anesthesiology

## 2018-07-16 ENCOUNTER — Other Ambulatory Visit: Payer: Self-pay

## 2018-07-16 ENCOUNTER — Encounter
Admission: RE | Admit: 2018-07-16 | Discharge: 2018-07-16 | Disposition: A | Discharge status: Home / Self Care | Payer: Medicare HMO | Source: Ambulatory Visit | Attending: Urology | Admitting: Urology

## 2018-07-16 DIAGNOSIS — Z885 Allergy status to narcotic agent status: Secondary | ICD-10-CM | POA: Diagnosis not present

## 2018-07-16 DIAGNOSIS — R079 Chest pain, unspecified: Secondary | ICD-10-CM

## 2018-07-16 DIAGNOSIS — Z01818 Encounter for other preprocedural examination: Secondary | ICD-10-CM | POA: Insufficient documentation

## 2018-07-16 DIAGNOSIS — Z85828 Personal history of other malignant neoplasm of skin: Secondary | ICD-10-CM | POA: Diagnosis not present

## 2018-07-16 DIAGNOSIS — N3020 Other chronic cystitis without hematuria: Secondary | ICD-10-CM | POA: Diagnosis not present

## 2018-07-16 DIAGNOSIS — R300 Dysuria: Secondary | ICD-10-CM | POA: Diagnosis present

## 2018-07-16 DIAGNOSIS — Z91018 Allergy to other foods: Secondary | ICD-10-CM | POA: Diagnosis not present

## 2018-07-16 DIAGNOSIS — N329 Bladder disorder, unspecified: Secondary | ICD-10-CM | POA: Diagnosis present

## 2018-07-16 DIAGNOSIS — Z91013 Allergy to seafood: Secondary | ICD-10-CM | POA: Diagnosis not present

## 2018-07-16 DIAGNOSIS — Z91048 Other nonmedicinal substance allergy status: Secondary | ICD-10-CM | POA: Diagnosis not present

## 2018-07-17 ENCOUNTER — Ambulatory Visit
Admission: RE | Admit: 2018-07-17 | Discharge: 2018-07-17 | Disposition: A | Discharge status: Home / Self Care | Payer: Medicare HMO | Attending: Urology | Admitting: Urology

## 2018-07-17 ENCOUNTER — Ambulatory Visit: Payer: Medicare HMO | Admitting: Anesthesiology

## 2018-07-17 ENCOUNTER — Encounter: Payer: Self-pay | Admitting: *Deleted

## 2018-07-17 ENCOUNTER — Other Ambulatory Visit: Payer: Self-pay

## 2018-07-17 ENCOUNTER — Encounter
Admission: RE | Disposition: A | Discharge status: Home / Self Care | Payer: Self-pay | Source: Home / Self Care | Attending: Urology

## 2018-07-17 DIAGNOSIS — N329 Bladder disorder, unspecified: Secondary | ICD-10-CM | POA: Insufficient documentation

## 2018-07-17 DIAGNOSIS — Z91013 Allergy to seafood: Secondary | ICD-10-CM | POA: Insufficient documentation

## 2018-07-17 DIAGNOSIS — N3020 Other chronic cystitis without hematuria: Secondary | ICD-10-CM | POA: Diagnosis not present

## 2018-07-17 DIAGNOSIS — N3289 Other specified disorders of bladder: Secondary | ICD-10-CM | POA: Diagnosis not present

## 2018-07-17 DIAGNOSIS — Z91048 Other nonmedicinal substance allergy status: Secondary | ICD-10-CM | POA: Insufficient documentation

## 2018-07-17 DIAGNOSIS — Z885 Allergy status to narcotic agent status: Secondary | ICD-10-CM | POA: Insufficient documentation

## 2018-07-17 DIAGNOSIS — Z85828 Personal history of other malignant neoplasm of skin: Secondary | ICD-10-CM | POA: Insufficient documentation

## 2018-07-17 DIAGNOSIS — Z91018 Allergy to other foods: Secondary | ICD-10-CM | POA: Insufficient documentation

## 2018-07-17 HISTORY — PX: CYSTOSCOPY WITH FULGERATION: SHX6638

## 2018-07-17 HISTORY — PX: CYSTOSCOPY WITH BIOPSY: SHX5122

## 2018-07-17 SURGERY — CYSTOSCOPY, WITH BIOPSY
Anesthesia: General | Site: Bladder

## 2018-07-17 MED ORDER — LACTATED RINGERS IV SOLN
INTRAVENOUS | Status: DC
  Administered 2018-07-17: 12:00:00 via INTRAVENOUS

## 2018-07-17 MED ORDER — PROPOFOL 500 MG/50ML IV EMUL
INTRAVENOUS | Status: DC | PRN
  Administered 2018-07-17: 100 ug/kg/min via INTRAVENOUS

## 2018-07-17 MED ORDER — DEXAMETHASONE SODIUM PHOSPHATE 10 MG/ML IJ SOLN
INTRAMUSCULAR | Status: DC | PRN
  Administered 2018-07-17: 10 mg via INTRAVENOUS

## 2018-07-17 MED ORDER — PROPOFOL 10 MG/ML IV BOLUS
INTRAVENOUS | Status: DC | PRN
  Administered 2018-07-17: 150 mg via INTRAVENOUS

## 2018-07-17 MED ORDER — HYDROCODONE-ACETAMINOPHEN 5-325 MG PO TABS: 1.0000 | ORAL_TABLET | ORAL | 0 refills | Status: AC | PRN

## 2018-07-17 MED ORDER — FENTANYL CITRATE (PF) 100 MCG/2ML IJ SOLN
INTRAMUSCULAR | Status: AC
  Filled 2018-07-17: qty 2

## 2018-07-17 MED ORDER — ONDANSETRON HCL 4 MG/2ML IJ SOLN
INTRAMUSCULAR | Status: DC | PRN
  Administered 2018-07-17: 4 mg via INTRAVENOUS

## 2018-07-17 MED ORDER — ONDANSETRON HCL 4 MG/2ML IJ SOLN: 4.0000 mg | Freq: Once | INTRAMUSCULAR | Status: DC | PRN

## 2018-07-17 MED ORDER — MIDAZOLAM HCL 2 MG/2ML IJ SOLN
INTRAMUSCULAR | Status: DC | PRN
  Administered 2018-07-17: 2 mg via INTRAVENOUS

## 2018-07-17 MED ORDER — ROCURONIUM BROMIDE 50 MG/5ML IV SOLN
INTRAVENOUS | Status: AC
  Filled 2018-07-17: qty 1

## 2018-07-17 MED ORDER — CEFAZOLIN SODIUM-DEXTROSE 2-4 GM/100ML-% IV SOLN
2.0000 g | INTRAVENOUS | Status: AC
  Administered 2018-07-17: 2 g via INTRAVENOUS

## 2018-07-17 MED ORDER — MIDAZOLAM HCL 2 MG/2ML IJ SOLN
INTRAMUSCULAR | Status: AC
  Filled 2018-07-17: qty 2

## 2018-07-17 MED ORDER — SCOPOLAMINE 1 MG/3DAYS TD PT72
1.0000 | MEDICATED_PATCH | TRANSDERMAL | Status: DC
  Administered 2018-07-17: 1.5 mg via TRANSDERMAL

## 2018-07-17 MED ORDER — FENTANYL CITRATE (PF) 100 MCG/2ML IJ SOLN
INTRAMUSCULAR | Status: DC | PRN
  Administered 2018-07-17: 50 ug via INTRAVENOUS
  Administered 2018-07-17: 100 ug via INTRAVENOUS

## 2018-07-17 MED ORDER — PROPOFOL 500 MG/50ML IV EMUL
INTRAVENOUS | Status: AC
  Filled 2018-07-17: qty 50

## 2018-07-17 MED ORDER — SUGAMMADEX SODIUM 200 MG/2ML IV SOLN
INTRAVENOUS | Status: DC | PRN
  Administered 2018-07-17: 200 mg via INTRAVENOUS

## 2018-07-17 MED ORDER — LIDOCAINE HCL (CARDIAC) PF 100 MG/5ML IV SOSY
PREFILLED_SYRINGE | INTRAVENOUS | Status: DC | PRN
  Administered 2018-07-17: 100 mg via INTRAVENOUS

## 2018-07-17 MED ORDER — SUCCINYLCHOLINE CHLORIDE 20 MG/ML IJ SOLN
INTRAMUSCULAR | Status: DC | PRN
  Administered 2018-07-17: 90 mg via INTRAVENOUS

## 2018-07-17 MED ORDER — SUCCINYLCHOLINE CHLORIDE 20 MG/ML IJ SOLN
INTRAMUSCULAR | Status: AC
  Filled 2018-07-17: qty 1

## 2018-07-17 MED ORDER — SCOPOLAMINE 1 MG/3DAYS TD PT72
MEDICATED_PATCH | TRANSDERMAL | Status: AC
  Filled 2018-07-17: qty 1

## 2018-07-17 MED ORDER — SUGAMMADEX SODIUM 200 MG/2ML IV SOLN
INTRAVENOUS | Status: AC
  Filled 2018-07-17: qty 2

## 2018-07-17 MED ORDER — LIDOCAINE HCL (PF) 2 % IJ SOLN
INTRAMUSCULAR | Status: AC
  Filled 2018-07-17: qty 10

## 2018-07-17 MED ORDER — PROPOFOL 10 MG/ML IV BOLUS
INTRAVENOUS | Status: AC
  Filled 2018-07-17: qty 20

## 2018-07-17 MED ORDER — FENTANYL CITRATE (PF) 100 MCG/2ML IJ SOLN: 25.0000 ug | INTRAMUSCULAR | Status: DC | PRN

## 2018-07-17 MED ORDER — ROCURONIUM BROMIDE 100 MG/10ML IV SOLN
INTRAVENOUS | Status: DC | PRN
  Administered 2018-07-17 (×2): 10 mg via INTRAVENOUS

## 2018-07-17 SURGICAL SUPPLY — 21 items
BAG DRAIN CYSTO-URO LG1000N (MISCELLANEOUS) ×3 IMPLANT
BRUSH SCRUB EZ  4% CHG (MISCELLANEOUS) ×2
BRUSH SCRUB EZ 4% CHG (MISCELLANEOUS) ×1 IMPLANT
DRSG TELFA 4X3 1S NADH ST (GAUZE/BANDAGES/DRESSINGS) ×3 IMPLANT
ELECT REM PT RETURN 9FT ADLT (ELECTROSURGICAL) ×3
ELECTRODE REM PT RTRN 9FT ADLT (ELECTROSURGICAL) ×1 IMPLANT
GLOVE BIOGEL PI IND STRL 7.5 (GLOVE) ×1 IMPLANT
GLOVE BIOGEL PI INDICATOR 7.5 (GLOVE) ×2
GOWN STRL REUS W/ TWL LRG LVL3 (GOWN DISPOSABLE) ×1 IMPLANT
GOWN STRL REUS W/ TWL XL LVL3 (GOWN DISPOSABLE) ×1 IMPLANT
GOWN STRL REUS W/TWL LRG LVL3 (GOWN DISPOSABLE) ×2
GOWN STRL REUS W/TWL XL LVL3 (GOWN DISPOSABLE) ×2
GUIDEWIRE STR DUAL SENSOR (WIRE) IMPLANT
KIT TURNOVER CYSTO (KITS) ×3 IMPLANT
PACK CYSTO AR (MISCELLANEOUS) ×3 IMPLANT
SET CYSTO W/LG BORE CLAMP LF (SET/KITS/TRAYS/PACK) ×3 IMPLANT
SOL .9 NS 3000ML IRR  AL (IV SOLUTION) ×2
SOL .9 NS 3000ML IRR UROMATIC (IV SOLUTION) ×1 IMPLANT
SURGILUBE 2OZ TUBE FLIPTOP (MISCELLANEOUS) ×3 IMPLANT
WATER STERILE IRR 1000ML POUR (IV SOLUTION) ×3 IMPLANT
WATER STERILE IRR 3000ML UROMA (IV SOLUTION) ×3 IMPLANT

## 2018-07-17 NOTE — Anesthesia Preprocedure Evaluation (Signed)
Anesthesia Evaluation  Patient identified by MRN, date of birth, ID band Patient awake    Reviewed: Allergy & Precautions, NPO status , Patient's Chart, lab work & pertinent test results, reviewed documented beta blocker date and time   History of Anesthesia Complications (+) PONV and history of anesthetic complications  Airway Mallampati: II  TM Distance: >3 FB     Dental  (+) Chipped   Pulmonary           Cardiovascular      Neuro/Psych    GI/Hepatic hiatal hernia, GERD  ,  Endo/Other    Renal/GU      Musculoskeletal   Abdominal   Peds  Hematology   Anesthesia Other Findings EKG ok. Has had a ventral hernia repair.  Reproductive/Obstetrics                             Anesthesia Physical Anesthesia Plan  ASA: III  Anesthesia Plan: General   Post-op Pain Management:    Induction: Intravenous  PONV Risk Score and Plan:   Airway Management Planned: Oral ETT  Additional Equipment:   Intra-op Plan:   Post-operative Plan:   Informed Consent: I have reviewed the patients History and Physical, chart, labs and discussed the procedure including the risks, benefits and alternatives for the proposed anesthesia with the patient or authorized representative who has indicated his/her understanding and acceptance.       Plan Discussed with: CRNA  Anesthesia Plan Comments:         Anesthesia Quick Evaluation

## 2018-07-17 NOTE — Discharge Instructions (Signed)
AMBULATORY SURGERY  DISCHARGE INSTRUCTIONS   1) The drugs that you were given will stay in your system until tomorrow so for the next 24 hours you should not:  A) Drive an automobile B) Make any legal decisions C) Drink any alcoholic beverage   2) You may resume regular meals tomorrow.  Today it is better to start with liquids and gradually work up to solid foods.  You may eat anything you prefer, but it is better to start with liquids, then soup and crackers, and gradually work up to solid foods.   3) Please notify your doctor immediately if you have any unusual bleeding, trouble breathing, redness and pain at the surgery site, drainage, fever, or pain not relieved by medication.    4) Additional Instructions: Dr Gillermo Murdoch will call for appointment         Please contact your physician with any problems or Same Day Surgery at [phone], Monday through Friday 6 am to 4 pm, or [place] at [facility] number at [phone].

## 2018-07-17 NOTE — Anesthesia Postprocedure Evaluation (Signed)
Anesthesia Post Note  Patient: Donald Barry  Procedure(s) Performed: CYSTOSCOPY WITH BLADDER BIOPSY (N/A Bladder) CYSTOSCOPY WITH FULGERATION (N/A Bladder)  Patient location during evaluation: PACU Anesthesia Type: General Level of consciousness: awake and alert Pain management: pain level controlled Vital Signs Assessment: post-procedure vital signs reviewed and stable Respiratory status: spontaneous breathing, nonlabored ventilation, respiratory function stable and patient connected to nasal cannula oxygen Cardiovascular status: blood pressure returned to baseline and stable Postop Assessment: no apparent nausea or vomiting Anesthetic complications: no     Last Vitals:  Vitals:   07/17/18 1704 07/17/18 1753  BP: (!) 149/95 (!) 138/92  Pulse: 62 62  Resp: 16 18  Temp: 36.8 C   SpO2: 100% 100%    Last Pain:  Vitals:   07/17/18 1707  TempSrc:   PainSc: 0-No pain                 Kyjuan Gause S

## 2018-07-17 NOTE — Progress Notes (Signed)
Pt able to void 100cc with 100cc pvr, pt dced home

## 2018-07-17 NOTE — Op Note (Signed)
Date of procedure: 07/17/18  Preoperative diagnosis:  1. Dysuria, bladder lesion ~2cm  Postoperative diagnosis:  1. Same  Procedure: 1. Cystoscopy, bladder biopsy and fulguration  Surgeon: Nickolas Madrid, MD  Anesthesia: General  Complications: None  Intraoperative findings:  1.  Normal-appearing urethra and prostate 2.  Ureteral orifices orthotopic bilaterally, close to bladder neck, volcano appearance 3.  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  Cold cup biopsy removal of all abnormal tissue, fulguration and excellent hemostasis  EBL: Minimal  Specimens: Bladder lesion  Drains: None  Indication: Donald Barry is a 73 y.o. patient with 4 months of severe dysuria, urgency, and frequency.  He ultimately underwent cystoscopy with one of my partners and was found to have an abnormal 1 cm bladder lesion. He presents today for diagnosis with biopsy.  After reviewing the management options for treatment, they elected to proceed with the above surgical procedure(s). We have discussed the potential benefits and risks of the procedure, side effects of the proposed treatment, the likelihood of the patient achieving the goals of the procedure, and any potential problems that might occur during the procedure or recuperation. Informed consent has been obtained.  Description of procedure:  The patient was taken to the operating room and general anesthesia was induced. SCDs were placed for DVT prophylaxis. The patient was placed in the dorsal lithotomy position, prepped and draped in the usual sterile fashion, and preoperative antibiotics were administered. A preoperative time-out was performed.   A 21 French rigid cystoscope with a 30 degree lens was used to intubate the urethra.  Normal-appearing urethra was followed proximally into the bladder.  The prostate was small in  size.  Thorough cystoscopy was performed.  The ureteral orifices were close to the bladder neck bilaterally, and volcano shaped in appearance.  There was a 1 cm bullous lesion at the left posterior bladder wall, and significant erythema at the trigone and posterior bladder wall.  This did not appear classic for bladder cancer.  There were also some small stellate erythematous lesions at the posterior wall and dome which were biopsied and fulgurated.  The Bugbee was used for hemostasis.  In a decompressed state, there was no active bleeding noted.  The ureteral orifices remained intact at the end of the case.  At the conclusion of the case all the abnormal bladder mucosa had been cold cup biopsied and fulgurated.  Disposition: Stable to PACU  Plan: Must void in PACU prior to discharge We will call with pathology results  Nickolas Madrid, MD

## 2018-07-17 NOTE — Transfer of Care (Signed)
Immediate Anesthesia Transfer of Care Note  Patient: Donald Barry  Procedure(s) Performed: CYSTOSCOPY WITH BLADDER BIOPSY (N/A Bladder) CYSTOSCOPY WITH FULGERATION (N/A Bladder)  Patient Location: PACU  Anesthesia Type:General  Level of Consciousness: drowsy  Airway & Oxygen Therapy: Patient Spontanous Breathing and Patient connected to face mask oxygen  Post-op Assessment: Report given to RN and Post -op Vital signs reviewed and stable  Post vital signs: stable  Last Vitals:  Vitals Value Taken Time  BP 131/89 07/17/2018  2:35 PM  Temp 35.9 C 07/17/2018  2:35 PM  Pulse 66 07/17/2018  2:38 PM  Resp 17 07/17/2018  2:38 PM  SpO2 100 % 07/17/2018  2:38 PM  Vitals shown include unvalidated device data.  Last Pain:  Vitals:   07/17/18 1435  TempSrc:   PainSc: Asleep         Complications: No apparent anesthesia complications

## 2018-07-17 NOTE — Anesthesia Post-op Follow-up Note (Signed)
Anesthesia QCDR form completed.        

## 2018-07-17 NOTE — Anesthesia Procedure Notes (Signed)
Procedure Name: Intubation Date/Time: 07/17/2018 1:46 PM Performed by: Aline Brochure, CRNA Pre-anesthesia Checklist: Patient identified, Emergency Drugs available, Suction available and Patient being monitored Patient Re-evaluated:Patient Re-evaluated prior to induction Oxygen Delivery Method: Circle system utilized Preoxygenation: Pre-oxygenation with 100% oxygen Induction Type: IV induction Ventilation: Mask ventilation without difficulty Laryngoscope Size: Mac and 4 Grade View: Grade I Tube type: Oral Tube size: 7.5 mm Number of attempts: 1 Airway Equipment and Method: Stylet Placement Confirmation: ETT inserted through vocal cords under direct vision,  positive ETCO2 and breath sounds checked- equal and bilateral Secured at: 23 cm Tube secured with: Tape Dental Injury: Teeth and Oropharynx as per pre-operative assessment

## 2018-07-17 NOTE — H&P (Signed)
    Donald Barry Mar 09, 1946 [id number]   HPI: He is a 73 year old healthy male who has a 62-month history of severe urinary symptoms including frequency every 30 minutes to an hour during the day and night with dysuria.  He was trialed on both oxybutynin and Myrbetriq by Dr. Matilde Sprang with no improvement in his symptoms.  He ultimately underwent a cystoscopy on 06/22/2018 which showed " inflammation of the left posterior wall with a small nodule" and Dr. Matilde Sprang recommended biopsy.  He also ordered a CT urogram which was performed on 07/07/2018 and showed no acute findings.  Prostate measured 34 g on that scan.  He denies any fevers or chills. Denies chest pain or SOB.   PMH: Past Medical History:  Diagnosis Date  . Allergic rhinitis, seasonal 06/14/2014  . Chronic neck pain 06/14/2014  . Complication of anesthesia   . Gastroesophageal reflux disease without esophagitis 06/14/2014  . Hepatic steatosis 04/05/2018  . Hiatal hernia 09/09/2017  . Intermittent left lower quadrant abdominal pain 07/15/2017  . PONV (postoperative nausea and vomiting)   . 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    Surgical History: Past Surgical History:  Procedure Laterality Date  . BACK SURGERY  2008   L4, L5  . COLONOSCOPY    . ESOPHAGOGASTRODUODENOSCOPY    . HERNIA REPAIR  2012  . STOMACH SURGERY     FUNDIC GLAND POLYP    Allergies:  Allergies  Allergen Reactions  . Tape Dermatitis    Skin blistered  . Shellfish Allergy Diarrhea and Nausea And Vomiting  . Onion     diarrhea  . Other Nausea And Vomiting    general anesthesia  . Codeine Nausea Only    Family History: Family History  Problem Relation Age of Onset  . Prostate cancer Neg Hx   . Kidney cancer Neg Hx     Social History:  reports that he has never smoked. He has never used smokeless tobacco. He reports that he does not drink alcohol or use drugs.  ROS: Negative aside from those in the HPI  Physical  Exam: BP (!) 131/95   Pulse 70   Temp 97.9 F (36.6 C) (Oral)   Resp 16   Ht 6' (1.829 m)   Wt 84.8 kg   SpO2 100%   BMI 25.35 kg/m    Constitutional:  Alert and oriented, No acute distress. Cardiovascular: Regular rate and rhythm Respiratory: CTA bilaterally GI: Abdomen is soft, nontender, nondistended, no abdominal masses Lymph: No cervical or inguinal lymphadenopathy. Skin: No rashes, bruises or suspicious lesions. Neurologic: Grossly intact, no focal deficits, moving all 4 extremities. Psychiatric: Normal mood and affect.  Laboratory Data: Urinalysis 2/10: no bacteria, 0WBCs, nitrite negative  Assessment & Plan:   I discussed the risks and benefits of cystoscopy, bladder biopsy, and fulguration at length with the patient.  We discussed that the pathology would determine the next steps in management.  We also discussed that this procedure may not improve his severe urinary symptoms, and further work-up may be required in the future including possible urodynamics.  We also briefly touched on interstitial cystitis as a potential cause for his symptoms if biopsy is negative.   Billey Co, [facility] [address] [place], [place] [address] [phone]

## 2018-07-19 ENCOUNTER — Encounter: Payer: Self-pay | Admitting: Urology

## 2018-07-21 ENCOUNTER — Telehealth: Payer: Self-pay | Admitting: Urology

## 2018-07-21 LAB — SURGICAL PATHOLOGY

## 2018-07-21 NOTE — Telephone Encounter (Signed)
Pt was returning a call back from [place] that he received yesterday( I could not find any notes) He states that he is feeling better since surgery.

## 2018-07-22 NOTE — Telephone Encounter (Signed)
Schedule follow up in 4 weeks for symptom check.  Nickolas Madrid, MD 07/22/2018

## 2018-07-23 ENCOUNTER — Telehealth: Payer: Self-pay | Admitting: Urology

## 2018-07-23 NOTE — Telephone Encounter (Signed)
App made  Patient is aware  Sharyn Lull

## 2018-07-23 NOTE — Telephone Encounter (Signed)
-----   Message from Billey Co, MD sent at 07/22/2018  8:38 AM EDT ----- Regarding: follow up Please schedule follow up for symptom check in 4-5 weeks.  Nickolas Madrid, MD 07/22/2018

## 2018-07-28 ENCOUNTER — Telehealth: Payer: Self-pay | Admitting: Urology

## 2018-07-28 NOTE — Telephone Encounter (Signed)
Pt called back informed pt that blood in the urine is normal after a bladder biopsy. Advised pt that he should become concerned if he is unable to void or runs a fever.

## 2018-07-28 NOTE — Telephone Encounter (Signed)
Pt was advised by pcp suggested a gluten free diet.  Pt is doing this and he has a bowel movement, he is noticing blood in his urine.  He has ordered Muralax but he won't get it til Friday and pt doesn't want to go out.

## 2018-08-17 ENCOUNTER — Other Ambulatory Visit: Payer: Self-pay

## 2018-08-17 ENCOUNTER — Telehealth (INDEPENDENT_AMBULATORY_CARE_PROVIDER_SITE_OTHER): Payer: Medicare HMO | Admitting: Urology

## 2018-08-17 DIAGNOSIS — R350 Frequency of micturition: Secondary | ICD-10-CM

## 2018-08-17 DIAGNOSIS — R300 Dysuria: Secondary | ICD-10-CM | POA: Diagnosis not present

## 2018-08-17 DIAGNOSIS — R3915 Urgency of urination: Secondary | ICD-10-CM | POA: Diagnosis not present

## 2018-08-17 NOTE — Progress Notes (Addendum)
Virtual Visit via Video chat Note  I connected with Donald Barry on 08/17/18 at 10:00 AM EDT by telephone and verified that I am speaking with the correct person using two identifiers.   I discussed the limitations, risks, security and privacy concerns of performing an evaluation and management service by virtual visit and the availability of in person appointments. We discussed the impact of the COVID-19 on the healthcare system, and the importance of social distancing and reducing patient and provider exposure. I also discussed with the patient that there may be a patient responsible charge related to this service. The patient expressed understanding and agreed to proceed.  Reason for visit: Dysuria/urgency/frequency, s/p bladder biopsy  History of Present Illness: Donald Barry is a 73 year old male who was originally referred to me for severe dysuria, urgency, and frequency.  He was previously followed by Dr. Matilde Sprang, and failed a trial of anticholinergics and Myrbetriq.  Cystoscopy in clinic showed some erythematous areas, and he underwent bladder biopsy with me on 07/17/2018.  This showed some areas of bullous erythema throughout the bladder.  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  Since surgery, he reports his symptoms have significantly improved.  He still has some bothersome nocturia 6-7 times per night and mild urgency and frequency during the day.  However, he is drinking 80 to 90 ounces of water during the day, as he was instructed by the clinic to push fluids after surgery.  He denies any ongoing hematuria, and his dysuria has almost completely resolved.  He does not drink soda, coffee, or tea.  He does have sleep apnea and uses a CPAP overnight.  Assessment and Plan: 73 year old male with urgency, frequency, dysuria, and nocturia status post bladder biopsy showing chronic cystitis.  Improved post resection, but some residual symptoms.  -Return to usual  fluid intake, I suspect a lot of his frequency and nocturia now is related to his massive fluid intake -Trial of 2 weeks scheduled NSAIDs -He will call in 2 weeks with a update on his symptoms -Consider re-trial of an anticholinergic at that time, or even PTNS in the future  Follow Up: -He will call with symptom update in 2 to 3 weeks   I discussed the assessment and treatment plan with the patient. The patient was provided an opportunity to ask questions and all were answered. The patient agreed with the plan and demonstrated an understanding of the instructions.   The patient was advised to call back or seek an in-person evaluation if the symptoms worsen or if the condition fails to improve as anticipated.  I provided 15 minutes of non-face-to-face time during this encounter.  ADDENDUM 4/20: Having persistent urgency/frequency day and night, nocturia 5-7x, dysuria has improved significantly.  -Voiding diary next 48 hours -Start Oxybutynin 10mg  XL daily  -Repeat voiding diary in 3-4 weeks -RTC in clinic in 6 weeks, consider UDS/PTNS/repeat cysto at that time   Billey Co, MD

## 2018-08-20 ENCOUNTER — Ambulatory Visit: Payer: Medicare HMO | Admitting: Urology

## 2018-08-31 ENCOUNTER — Telehealth: Payer: Self-pay | Admitting: Urology

## 2018-08-31 MED ORDER — OXYBUTYNIN CHLORIDE ER 10 MG PO TB24: 10.0000 mg | ORAL_TABLET | Freq: Every day | ORAL | 11 refills | Status: DC

## 2018-08-31 NOTE — Telephone Encounter (Signed)
Pt called and states that Dr Diamantina Providence wanted him to call him today concerning his bladder. Please advise.

## 2018-08-31 NOTE — Telephone Encounter (Signed)
App made and mailed to patient ° °Donald Barry °

## 2018-08-31 NOTE — Telephone Encounter (Signed)
-----   Message from Billey Co, MD sent at 08/31/2018 10:59 AM EDT ----- Regarding: follow up Please schedule follow up in person in 6 weeks with PVR.  Nickolas Madrid, MD 08/31/2018

## 2018-08-31 NOTE — Addendum Note (Signed)
Addended by: Nickolas Madrid C on: 08/31/2018 11:00 AM   Modules accepted: Orders

## 2018-09-23 ENCOUNTER — Other Ambulatory Visit: Payer: Self-pay

## 2018-09-24 ENCOUNTER — Inpatient Hospital Stay: Payer: Medicare HMO

## 2018-09-24 ENCOUNTER — Inpatient Hospital Stay: Payer: Medicare HMO | Attending: Oncology | Admitting: Oncology

## 2018-09-24 ENCOUNTER — Encounter: Payer: Self-pay | Admitting: Oncology

## 2018-09-24 ENCOUNTER — Other Ambulatory Visit: Payer: Self-pay

## 2018-09-24 VITALS — BP 125/87 | HR 69 | Temp 95.1°F | Resp 18 | Ht 72.0 in | Wt 194.6 lb

## 2018-09-24 DIAGNOSIS — Z79899 Other long term (current) drug therapy: Secondary | ICD-10-CM

## 2018-09-24 DIAGNOSIS — D472 Monoclonal gammopathy: Secondary | ICD-10-CM | POA: Insufficient documentation

## 2018-09-24 DIAGNOSIS — M542 Cervicalgia: Secondary | ICD-10-CM | POA: Diagnosis not present

## 2018-09-24 DIAGNOSIS — I10 Essential (primary) hypertension: Secondary | ICD-10-CM | POA: Diagnosis not present

## 2018-09-24 DIAGNOSIS — K449 Diaphragmatic hernia without obstruction or gangrene: Secondary | ICD-10-CM | POA: Diagnosis not present

## 2018-09-24 DIAGNOSIS — Z7982 Long term (current) use of aspirin: Secondary | ICD-10-CM | POA: Insufficient documentation

## 2018-09-24 DIAGNOSIS — K219 Gastro-esophageal reflux disease without esophagitis: Secondary | ICD-10-CM | POA: Insufficient documentation

## 2018-09-24 DIAGNOSIS — R1032 Left lower quadrant pain: Secondary | ICD-10-CM | POA: Insufficient documentation

## 2018-09-24 DIAGNOSIS — Z86010 Personal history of colonic polyps: Secondary | ICD-10-CM | POA: Insufficient documentation

## 2018-09-24 DIAGNOSIS — G8929 Other chronic pain: Secondary | ICD-10-CM | POA: Insufficient documentation

## 2018-09-24 DIAGNOSIS — K760 Fatty (change of) liver, not elsewhere classified: Secondary | ICD-10-CM | POA: Diagnosis not present

## 2018-09-24 LAB — COMPREHENSIVE METABOLIC PANEL
ALT: 19 U/L (ref 0–44)
AST: 24 U/L (ref 15–41)
Albumin: 4 g/dL (ref 3.5–5.0)
Alkaline Phosphatase: 26 U/L — ABNORMAL LOW (ref 38–126)
Anion gap: 7 (ref 5–15)
BUN: 14 mg/dL (ref 8–23)
CO2: 28 mmol/L (ref 22–32)
Calcium: 9 mg/dL (ref 8.9–10.3)
Chloride: 107 mmol/L (ref 98–111)
Creatinine, Ser: 0.74 mg/dL (ref 0.61–1.24)
GFR calc Af Amer: >60 mL/min (ref >60)
GFR calc non Af Amer: >60 mL/min (ref >60)
Glucose, Bld: 98 mg/dL (ref 70–99)
Potassium: 4.5 mmol/L (ref 3.5–5.1)
Sodium: 142 mmol/L (ref 135–145)
Total Bilirubin: 0.8 mg/dL (ref 0.3–1.2)
Total Protein: 6.6 g/dL (ref 6.5–8.1)

## 2018-09-24 LAB — CBC WITH DIFFERENTIAL/PLATELET
Abs Immature Granulocytes: 0.01 10*3/uL (ref 0.00–0.07)
Basophils Absolute: 0 10*3/uL (ref 0.0–0.1)
Basophils Relative: 0 %
Eosinophils Absolute: 0.2 10*3/uL (ref 0.0–0.5)
Eosinophils Relative: 3 %
HCT: 43.1 % (ref 39.0–52.0)
Hemoglobin: 14.7 g/dL (ref 13.0–17.0)
Immature Granulocytes: 0 %
Lymphocytes Relative: 21 %
Lymphs Abs: 1 10*3/uL (ref 0.7–4.0)
MCH: 31.5 pg (ref 26.0–34.0)
MCHC: 34.1 g/dL (ref 30.0–36.0)
MCV: 92.5 fL (ref 80.0–100.0)
Monocytes Absolute: 0.5 10*3/uL (ref 0.1–1.0)
Monocytes Relative: 11 %
Neutro Abs: 3 10*3/uL (ref 1.7–7.7)
Neutrophils Relative %: 65 %
Platelets: 147 10*3/uL — ABNORMAL LOW (ref 150–400)
RBC: 4.66 MIL/uL (ref 4.22–5.81)
RDW: 13 % (ref 11.5–15.5)
WBC: 4.7 10*3/uL (ref 4.0–10.5)
nRBC: 0 % (ref 0.0–0.2)

## 2018-09-24 NOTE — Progress Notes (Signed)
Pt was sent here due to m spike. Pt was being worked up for abd. Pain.

## 2018-09-24 NOTE — Progress Notes (Signed)
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  As a part of that work-up patient had SPEP done Which showed mildly elevated IgM levels of 202.  IgM monoclonal kappa light chain was noted on immunofixation 0.2 g.  Hence the patient has been referred to [place].  Of note patient had a normal CBC with a white count of 4.1, H&H of 14.8/43.2 in February 2020.  His platelet counts have always been mildly low in the 120s.  CMP was unremarkable.  Iron studies were normal.  Patient also had CT of the abdomen done as a part of microscopic hematuria in February 2020 which did not reveal any evidence of splenomegaly.  Patient reports a normal appetite and his weight has been stable.  Denies any lumps or bumps anywhere.  Denies any fatigue drenching night sweats or unintentional weight loss.  Patient is a vague dull aching intermittent abdominal pain which is associated with some change in his bowel habits and has been ongoing for the last 1 month.  ECOG PS- 0  Pain scale- 0   Review of systems- Review of Systems  Constitutional: Negative for chills, fever, malaise/fatigue and weight loss.  HENT: Negative for congestion, ear discharge and nosebleeds.   Eyes: Negative for blurred vision.  Respiratory: Negative for cough, hemoptysis, sputum production, shortness of breath and wheezing.   Cardiovascular: Negative for chest pain, palpitations, orthopnea and claudication.  Gastrointestinal: Positive for abdominal pain.  Negative for blood in stool, constipation, diarrhea, heartburn, melena, nausea and vomiting.  Genitourinary: Negative for dysuria, flank pain, frequency, hematuria and urgency.  Musculoskeletal: Negative for back pain, joint pain and myalgias.  Skin: Negative for rash.  Neurological: Negative for dizziness, tingling, focal weakness, seizures, weakness and headaches.  Endo/Heme/Allergies: Does not bruise/bleed easily.  Psychiatric/Behavioral: Negative for depression and suicidal ideas. The patient does not have insomnia.     Allergies  Allergen Reactions  . Tape Dermatitis    Skin blistered- steri strip   . Shellfish Allergy Diarrhea and Nausea And Vomiting    Shrimp   . Onion     diarrhea  . Other Nausea And Vomiting    general anesthesia  . Codeine Nausea Only    Patient Active Problem List   Diagnosis Date Noted  . Hepatic steatosis 04/05/2018  . Hiatal hernia 09/09/2017  . Hx of adenomatous colonic polyps 07/15/2017  . Intermittent left lower quadrant abdominal pain 07/15/2017  . Allergic rhinitis, seasonal 06/14/2014  . Chronic neck pain 06/14/2014  . Gastroesophageal reflux disease without esophagitis 06/14/2014     Past Medical History:  Diagnosis Date  . Allergic rhinitis, seasonal 06/14/2014  . Allergy   . Arthritis   . Chronic neck pain 06/14/2014  . Complication of anesthesia   . Gastroesophageal reflux disease without esophagitis 06/14/2014  . Hepatic steatosis 04/05/2018  . Hiatal hernia 09/09/2017  . Intermittent left lower quadrant abdominal pain 07/15/2017  . PONV (postoperative nausea and vomiting)   . 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  Past Surgical History:  Procedure Laterality Date  . BACK SURGERY  2008   L4, L5  . COLONOSCOPY    . CYSTOSCOPY WITH BIOPSY N/A 07/17/2018   Procedure: CYSTOSCOPY WITH BLADDER BIOPSY;  Surgeon: Billey Co, MD;  Location: [facility] ORS;  Service: Urology;  Laterality: N/A;  . CYSTOSCOPY WITH  FULGERATION N/A 07/17/2018   Procedure: CYSTOSCOPY WITH FULGERATION;  Surgeon: Billey Co, MD;  Location: [facility] ORS;  Service: Urology;  Laterality: N/A;  . ESOPHAGOGASTRODUODENOSCOPY    . HERNIA REPAIR  2012  . STOMACH SURGERY     FUNDIC GLAND POLYP    Social History   Socioeconomic History  . Marital status: Married    Spouse name: Not on file  . Number of children: Not on file  . Years of education: Not on file  . Highest education level: Not on file  Occupational History  . Not on file  Social Needs  . Financial resource strain: Not on file  . Food insecurity:    Worry: Not on file    Inability: Not on file  . Transportation needs:    Medical: Not on file    Non-medical: Not on file  Tobacco Use  . Smoking status: Never Smoker  . Smokeless tobacco: Never Used  Substance and Sexual Activity  . Alcohol use: Never    Frequency: Never  . Drug use: Never  . Sexual activity: Yes    Birth control/protection: None  Lifestyle  . Physical activity:    Days per week: Not on file    Minutes per session: Not on file  . Stress: Not on file  Relationships  . Social connections:    Talks on phone: Not on file    Gets together: Not on file    Attends religious service: Not on file    Active member of club or organization: Not on file    Attends meetings of clubs or organizations: Not on file    Relationship status: Not on file  . Intimate partner violence:    Fear of current or ex partner: Not on file    Emotionally abused: Not on file    Physically abused: Not on file    Forced sexual activity: Not on file  Other Topics Concern  . Not on file  Social History Narrative  . Not on file     Family History  Problem Relation Age of Onset  . Hypertension Mother   . GER disease Sister   . GER disease Brother   . Prostate cancer Neg Hx   . Kidney cancer Neg Hx      Current Outpatient Medications:  .  acetaminophen (TYLENOL) 500 MG tablet, Take 1,000 mg by mouth  every 6 (six) hours as needed for moderate pain or headache., Disp: , Rfl:  .  aspirin EC 81 MG tablet, Take 81 mg by mouth daily. , Disp: , Rfl:  .  fluticasone (FLONASE) 50 MCG/ACT nasal spray, Place 1 spray into the nose at bedtime. , Disp: , Rfl:  .  loratadine (ALAVERT) 10 MG dissolvable tablet, Take 10 mg by mouth daily., Disp: , Rfl:  .  loratadine (CLARITIN) 10 MG tablet, Take 10 mg by mouth daily as needed. , Disp: , Rfl:  .  Menthol, Topical Analgesic, (BLUE-EMU MAXIMUM STRENGTH EX), Apply 1 application topically daily. , Disp: , Rfl:  .  Multiple Vitamin (MULTIVITAMIN WITH MINERALS) TABS tablet, Take 1 tablet by mouth daily., Disp: , Rfl:  .  omeprazole (PRILOSEC) 40 MG capsule, Take 40 mg by mouth daily., Disp: , Rfl:  .  oxybutynin (DITROPAN-XL) 10 MG 24 hr tablet, Take 1 tablet (10 mg total) by mouth at bedtime., Disp: 30 tablet, Rfl: 11 .  Sodium Chloride-Sodium Bicarb (NETI POT SINUS [place] NA), Place 1 Dose into the nose at bedtime as needed (congestion)., Disp: , Rfl:    Physical exam:  Vitals:   09/24/18 0836  BP: 125/87  Pulse: 69  Resp: 18  Temp: (!) 95.1 F (35.1 C)  TempSrc: Tympanic  Weight: 194 lb 9.6 oz (88.3 kg)  Height: 6' (1.829 m)   Physical Exam Constitutional:      General: He is not in acute distress. HENT:     Head: Normocephalic and atraumatic.  Eyes:     Pupils: Pupils are equal, round, and reactive to light.  Neck:     Musculoskeletal: Normal range of motion.  Cardiovascular:     Rate and Rhythm: Normal rate and regular rhythm.     Heart sounds: Normal heart sounds.  Pulmonary:     Effort: Pulmonary effort is normal.     Breath sounds: Normal breath sounds.  Abdominal:     General: Bowel sounds are normal. There is no distension.     Palpations: Abdomen is soft.     Tenderness: There is no abdominal tenderness.     Comments: No palpable hepatosplenomegaly  Lymphadenopathy:     Comments: No palpable cervical, supraclavicular, axillary or  inguinal adenopathy   Skin:    General: Skin is warm and dry.  Neurological:     Mental Status: He is alert and oriented to person, place, and time.        CMP Latest Ref Rng & Units 07/07/2018  Creatinine 0.61 - 1.24 mg/dL 0.70    Assessment and plan- Patient is a 73 y.o. male referred for elevated IgM and monoclonal gammopathy  Patient found to have a very small amount of IgM kappa monoclonal protein of 0.2 g.  Patient does not have any B symptoms.  No hepatosplenomegaly noted on exam as well as recent imaging in February 2020.  Also his hemoglobin is normal and so are his kidney functions.  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  At this time I will check a CBC with differential, CMP, serum free light chains, myeloma panel and random urine protein electrophoresis.  I will have a video visit with the patient next week after the results of this tests are back.  Discussed the natural history of MGUS and risk of progression to smoldering multiple myeloma to overt multiple myeloma.  Typically elevated IgM protein will also mean Walden Strom's macroglobulinemia but at this time clinically the suspicion is low given the small amount of monoclonal protein in his serum and no concerning signs and symptoms.   Total face to face encounter time for this patient visit was 30 min. >50% of the time was  spent in counseling and coordination of care.    Thank you for this kind referral and the opportunity to participate in the care of this patient   Visit Diagnosis 1. MGUS (monoclonal gammopathy of unknown significance)     Dr. Randa Evens, MD, MPH [facility] [phone] 09/24/2018  9:23 AM

## 2018-09-25 LAB — MULTIPLE MYELOMA PANEL, SERUM
Albumin SerPl Elph-Mcnc: 3.7 g/dL (ref 2.9–4.4)
Albumin/Glob SerPl: 1.5 (ref 0.7–1.7)
Alpha 1: 0.2 g/dL (ref 0.0–0.4)
Alpha2 Glob SerPl Elph-Mcnc: 0.7 g/dL (ref 0.4–1.0)
B-Globulin SerPl Elph-Mcnc: 0.9 g/dL (ref 0.7–1.3)
Gamma Glob SerPl Elph-Mcnc: 0.7 g/dL (ref 0.4–1.8)
Globulin, Total: 2.5 g/dL (ref 2.2–3.9)
IgA: 111 mg/dL (ref 61–437)
IgG (Immunoglobin G), Serum: 681 mg/dL (ref 603–1613)
IgM (Immunoglobulin M), Srm: 198 mg/dL — ABNORMAL HIGH (ref 15–143)
M Protein SerPl Elph-Mcnc: 0.3 g/dL — ABNORMAL HIGH
Total Protein ELP: 6.2 g/dL (ref 6.0–8.5)

## 2018-09-25 LAB — PROTEIN ELECTRO, RANDOM URINE
Albumin ELP, Urine: 48.1 %
Alpha-1-Globulin, U: 4 %
Alpha-2-Globulin, U: 13 %
Beta Globulin, U: 20.1 %
Gamma Globulin, U: 14.9 %
Total Protein, Urine: 12.9 mg/dL

## 2018-09-25 LAB — KAPPA/LAMBDA LIGHT CHAINS
Kappa free light chain: 21.6 mg/L — ABNORMAL HIGH (ref 3.3–19.4)
Kappa, lambda light chain ratio: 1.86 — ABNORMAL HIGH (ref 0.26–1.65)
Lambda free light chains: 11.6 mg/L (ref 5.7–26.3)

## 2018-09-30 DIAGNOSIS — D696 Thrombocytopenia, unspecified: Secondary | ICD-10-CM | POA: Insufficient documentation

## 2018-10-02 ENCOUNTER — Encounter: Payer: Self-pay | Admitting: Oncology

## 2018-10-02 ENCOUNTER — Other Ambulatory Visit: Payer: Self-pay

## 2018-10-02 ENCOUNTER — Inpatient Hospital Stay (HOSPITAL_BASED_OUTPATIENT_CLINIC_OR_DEPARTMENT_OTHER): Payer: Medicare HMO | Admitting: Oncology

## 2018-10-02 DIAGNOSIS — Z79899 Other long term (current) drug therapy: Secondary | ICD-10-CM | POA: Diagnosis not present

## 2018-10-02 DIAGNOSIS — Z7982 Long term (current) use of aspirin: Secondary | ICD-10-CM | POA: Diagnosis not present

## 2018-10-02 DIAGNOSIS — D472 Monoclonal gammopathy: Secondary | ICD-10-CM

## 2018-10-02 NOTE — Progress Notes (Signed)
Pt has no pain today , MD will discuss results of blood work today

## 2018-10-04 NOTE — Progress Notes (Signed)
I connected with Donald Barry on 10/04/18 at  9:30 AM EDT by video enabled telemedicine visit and verified that I am speaking with the correct person using two identifiers.   I discussed the limitations, risks, security and privacy concerns of performing an evaluation and management service by telemedicine and the availability of in-person appointments. I also discussed with the patient that there may be a patient responsible charge related to this service. The patient expressed understanding and agreed to proceed.  Other persons participating in the visit and their role in the encounter:  none  Patient's location:  home Provider's location:  home  Chief Complaint:  Discuss results of bloodwork  History of present illness: patient is a 73 year old male with a past medical history of fatty liver and GERD who was seen by pulmonology clinic GI for symptoms of abdominal pain.  As a part of that work-up patient had SPEP done Which showed mildly elevated IgM levels of 202.  IgM monoclonal kappa light chain was noted on immunofixation 0.2 g.  Hence the patient has been referred to [place].  Of note patient had a normal CBC with a white count of 4.1, H&H of 14.8/43.2 in February 2020.  His platelet counts have always been mildly low in the 120s.  CMP was unremarkable.  Iron studies were normal.  Patient also had CT of the abdomen done as a part of microscopic hematuria in February 2020 which did not reveal any evidence of splenomegaly  Results of blood work from 09/24/2018 were as follows: CBC showed white count of 4.7, H&H of 14.7/43.1.  Count of 147.  CMP was within normal limits.  Multiple myeloma panel showed M protein IgM kappa 0.3 g.  Serum free light chains revealed a mildly elevated kappa free light chain 21.6 with a kappa lambda light chain ratio of 1.86.  Interval history patient reports doing well.  Denies any abdominal pain at this time   Review of Systems  Constitutional: Negative for chills,  fever and weight loss.  HENT: Negative for congestion, ear discharge and nosebleeds.   Eyes: Negative for blurred vision.  Respiratory: Negative for cough, hemoptysis, sputum production, shortness of breath and wheezing.   Cardiovascular: Negative for chest pain, palpitations, orthopnea and claudication.  Gastrointestinal: Negative for abdominal pain, blood in stool, constipation, diarrhea, heartburn, melena, nausea and vomiting.  Genitourinary: Negative for dysuria, flank pain, frequency, hematuria and urgency.  Musculoskeletal: Negative for back pain, joint pain and myalgias.  Skin: Negative for rash.  Neurological: Negative for dizziness, tingling, focal weakness, seizures, weakness and headaches.  Endo/Heme/Allergies: Does not bruise/bleed easily.  Psychiatric/Behavioral: Negative for depression and suicidal ideas. The patient does not have insomnia.     Allergies  Allergen Reactions  . Tape Dermatitis    Skin blistered- steri strip   . Shellfish Allergy Diarrhea and Nausea And Vomiting    Shrimp   . Onion     diarrhea  . Other Nausea And Vomiting    general anesthesia  . Codeine Nausea Only    Past Medical History:  Diagnosis Date  . Allergic rhinitis, seasonal 06/14/2014  . Allergy   . Arthritis   . Chronic neck pain 06/14/2014  . Complication of anesthesia   . Gastroesophageal reflux disease without esophagitis 06/14/2014  . Hepatic steatosis 04/05/2018  . Hiatal hernia 09/09/2017  . Intermittent left lower quadrant abdominal pain 07/15/2017  . PONV (postoperative nausea and vomiting)   . 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  Past Surgical History:  Procedure Laterality Date  . BACK SURGERY  2008   L4, L5  . COLONOSCOPY    . CYSTOSCOPY WITH BIOPSY N/A 07/17/2018   Procedure: CYSTOSCOPY WITH BLADDER BIOPSY;  Surgeon: Billey Co, MD;  Location: [facility];  Service: Urology;  Laterality: N/A;  . CYSTOSCOPY WITH FULGERATION N/A 07/17/2018    Procedure: CYSTOSCOPY WITH FULGERATION;  Surgeon: Billey Co, MD;  Location: [facility];  Service: Urology;  Laterality: N/A;  . ESOPHAGOGASTRODUODENOSCOPY    . HERNIA REPAIR  2012  . STOMACH SURGERY     FUNDIC GLAND POLYP    Social History   Socioeconomic History  . Marital status: Married    Spouse name: Not on file  . Number of children: Not on file  . Years of education: Not on file  . Highest education level: Not on file  Occupational History  . Not on file  Social Needs  . Financial resource strain: Not on file  . Food insecurity:    Worry: Not on file    Inability: Not on file  . Transportation needs:    Medical: Not on file    Non-medical: Not on file  Tobacco Use  . Smoking status: Never Smoker  . Smokeless tobacco: Never Used  Substance and Sexual Activity  . Alcohol use: Never    Frequency: Never  . Drug use: Never  . Sexual activity: Yes    Birth control/protection: None  Lifestyle  . Physical activity:    Days per week: Not on file    Minutes per session: Not on file  . Stress: Not on file  Relationships  . Social connections:    Talks on phone: Not on file    Gets together: Not on file    Attends religious service: Not on file    Active member of club or organization: Not on file    Attends meetings of clubs or organizations: Not on file    Relationship status: Not on file  . Intimate partner violence:    Fear of current or ex partner: Not on file    Emotionally abused: Not on file    Physically abused: Not on file    Forced sexual activity: Not on file  Other Topics Concern  . Not on file  Social History Narrative  . Not on file    Family History  Problem Relation Age of Onset  . Hypertension Mother   . GER disease Sister   . GER disease Brother   . Prostate cancer Neg Hx   . Kidney cancer Neg Hx      Current Outpatient Medications:  .  acetaminophen (TYLENOL) 500 MG tablet, Take 1,000 mg by mouth every 6 (six) hours as needed  for moderate pain or headache., Disp: , Rfl:  .  aspirin EC 81 MG tablet, Take 81 mg by mouth daily. , Disp: , Rfl:  .  fluticasone (FLONASE) 50 MCG/ACT nasal spray, Place 1 spray into the nose at bedtime. , Disp: , Rfl:  .  loratadine (ALAVERT) 10 MG dissolvable tablet, Take 10 mg by mouth daily., Disp: , Rfl:  .  Menthol, Topical Analgesic, (BLUE-EMU MAXIMUM STRENGTH EX), Apply 1 application topically daily as needed (joint pain). , Disp: , Rfl:  .  Multiple Vitamin (MULTIVITAMIN WITH MINERALS) TABS tablet, Take 1 tablet by mouth daily., Disp: , Rfl:  .  omeprazole (PRILOSEC) 40 MG capsule, Take 40 mg by mouth daily., Disp: , Rfl:  .  oxybutynin (DITROPAN-XL) 10 MG 24 hr tablet, Take 1 tablet (10 mg total) by mouth at bedtime., Disp: 30 tablet, Rfl: 11 .  Sodium Chloride-Sodium Bicarb (NETI POT SINUS [place] NA), Place 1 Dose into the nose at bedtime as needed (congestion)., Disp: , Rfl:   No results found.  No images are attached to the encounter.   CMP Latest Ref Rng & Units 09/24/2018  Glucose 70 - 99 mg/dL 98  BUN 8 - 23 mg/dL 14  Creatinine 0.61 - 1.24 mg/dL 0.74  Sodium 135 - 145 mmol/L 142  Potassium 3.5 - 5.1 mmol/L 4.5  Chloride 98 - 111 mmol/L 107  CO2 22 - 32 mmol/L 28  Calcium 8.9 - 10.3 mg/dL 9.0  Total Protein 6.5 - 8.1 g/dL 6.6  Total Bilirubin 0.3 - 1.2 mg/dL 0.8  Alkaline Phos 38 - 126 U/L 26(L)  AST 15 - 41 U/L 24  ALT 0 - 44 U/L 19   CBC Latest Ref Rng & Units 09/24/2018  WBC 4.0 - 10.5 K/uL 4.7  Hemoglobin 13.0 - 17.0 g/dL 14.7  Hematocrit 39.0 - 52.0 % 43.1  Platelets 150 - 400 K/uL 147(L)     Observation/objective: Appears in no acute distress of a video visit today.  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  He has a small amount of IgM kappa M protein of 0.3 g.  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  Although his serum free light chain value is 1.8 which is greater than 1.65 I will hold off on a bone marrow biopsy at this time.  I am inclined to monitor his serum free light chains and M protein conservatively and if there is a further increase in kappa lambda light chain ratio and all of M protein I will consider bone marrow biopsy at that time  Discussed what IgM MGUS is and risk of progression to overt Walden Strom's macroglobulinemia as opposed to IgM myeloma which is rare.  Patient does not have any hepatosplenomegaly noted on his recent CT scan from February 2020.  No B symptoms.  Continue to monitor  Follow-up instructions: CBC, CMP, myeloma panel and serum free light chains in 6 months and I will see him back in 6 months  I discussed the assessment and treatment plan with the patient. The patient was provided an opportunity to ask questions and all were answered. The patient agreed with the plan and demonstrated an understanding of the instructions.   The patient was advised to call back or seek an in-person evaluation if the symptoms worsen or if the condition fails to improve as anticipated.   Visit Diagnosis: 1. MGUS (monoclonal gammopathy of unknown significance)     Dr. Randa Evens, MD, MPH [facility] [phone] 10/04/2018 7:32 AM

## 2018-10-28 ENCOUNTER — Ambulatory Visit: Payer: Medicare HMO | Admitting: Urology

## 2018-11-11 ENCOUNTER — Ambulatory Visit (INDEPENDENT_AMBULATORY_CARE_PROVIDER_SITE_OTHER): Payer: Medicare HMO | Admitting: Urology

## 2018-11-11 ENCOUNTER — Other Ambulatory Visit: Payer: Self-pay

## 2018-11-11 ENCOUNTER — Encounter: Payer: Self-pay | Admitting: Urology

## 2018-11-11 VITALS — BP 122/75 | HR 73 | Ht 72.0 in | Wt 187.0 lb

## 2018-11-11 DIAGNOSIS — N400 Enlarged prostate without lower urinary tract symptoms: Secondary | ICD-10-CM | POA: Diagnosis not present

## 2018-11-11 DIAGNOSIS — R350 Frequency of micturition: Secondary | ICD-10-CM | POA: Diagnosis not present

## 2018-11-11 DIAGNOSIS — R351 Nocturia: Secondary | ICD-10-CM | POA: Diagnosis not present

## 2018-11-11 LAB — BLADDER SCAN AMB NON-IMAGING

## 2018-11-11 MED ORDER — NOCDURNA 55.3 MCG SL SUBL: 55.3000 ug | SUBLINGUAL_TABLET | Freq: Every day | SUBLINGUAL | 11 refills | Status: DC

## 2018-11-11 NOTE — Progress Notes (Signed)
   11/11/2018 10:59 AM   Donald Barry 17-May-1945 [id number]  Reason for visit: Follow up urgency/frequency/dysuria/nocturia  HPI: I saw Donald Barry back in urology clinic for follow-up of irritative voiding symptoms.  To briefly summarize, he initially presented in early 2020 with severe dysuria, urgency, frequency, and nocturia.  He ultimately was found to have some bullous erythema on cystoscopy, and ultimately underwent biopsy and fulguration of these lesions with pathology showing chronic cystitis.  His symptoms improved significantly after surgery, especially his dysuria and urgency/frequency.  He is following behavioral strategies closely including minimizing caffeine, soda, tea, and alcohol, compliant with his CPAP for sleep apnea, minimizing fluids in the evening, and double voiding prior to bed.  Overall he feels he has improved significantly since surgery.  He has been on oxybutynin 10 mg XL daily which she feels has also improved his urgency and frequency.  His primary complaint is persistent nocturia 4-5 times per night, which is improved from initial 8-10 times per night before surgery.  During the day he is minimally bothered by his symptoms.  IPSS score today is 10, with quality of life mostly dissatisfied.  PVR 32 mL.   ROS: Please see flowsheet from today's date for complete review of systems.  Physical Exam: BP 122/75 (BP Location: Left Arm, Patient Position: Sitting)   Pulse 73   Ht 6' (1.829 m)   Wt 187 lb (84.8 kg)   BMI 25.36 kg/m    Constitutional:  Alert and oriented, No acute distress. Respiratory: Normal respiratory effort, no increased work of breathing. GI: Abdomen is soft, nontender, nondistended, no abdominal masses Skin: No rashes, bruises or suspicious lesions. Neurologic: Grossly intact, no focal deficits, moving all 4 extremities. Psychiatric: Normal mood and affect  Assessment & Plan:   In summary, the patient is a 73 year old male with severe  irritative voiding symptoms including urgency, frequency, dysuria, nocturia who underwent cystoscopy with biopsy and fulguration in March 2020 and was found to have multiple bullous erythematous lesions in the bladder.  Pathology showed chronic cystitis, and his symptoms improved significantly postoperatively.  His chief complaint is ongoing nocturia 4-5 times per night despite oxybutynin and behavioral strategies.  He is interested in any potential options to improve his nocturia.  We discussed the risks, benefits, and alternatives to nocdurna at length, and he would like to trial this medication.  Savings card given today.  Trial of nocdurna BMP in 1 week and 4 weeks after starting nocturia to evaluate for hyponatremia, will call with results Virtual visit in 4 to 6 weeks for symptom check  A total of 15 minutes were spent face-to-face with the patient, greater than 50% was spent in patient education, counseling, and coordination of care regarding LUTS, nocturia, and risks/benefits/alternatives to nocdurna.  Billey Co, [facility] [address] [place], [place] [address] [phone]

## 2018-11-18 ENCOUNTER — Telehealth: Payer: Self-pay | Admitting: Family Medicine

## 2018-11-18 NOTE — Telephone Encounter (Signed)
Spoke to patient and informed him the Nocdurna has been approved. Date: 05/11/2018-05/19/2019 [id number]

## 2018-12-21 ENCOUNTER — Other Ambulatory Visit: Payer: Self-pay

## 2018-12-21 ENCOUNTER — Telehealth (INDEPENDENT_AMBULATORY_CARE_PROVIDER_SITE_OTHER): Payer: Medicare HMO | Admitting: Urology

## 2018-12-21 DIAGNOSIS — N3010 Interstitial cystitis (chronic) without hematuria: Secondary | ICD-10-CM | POA: Diagnosis not present

## 2018-12-21 MED ORDER — AMITRIPTYLINE HCL 50 MG PO TABS: 50.0000 mg | ORAL_TABLET | Freq: Every day | ORAL | 3 refills | Status: DC

## 2018-12-21 NOTE — Progress Notes (Signed)
Virtual Visit via Telephone Note  I connected with Donald Barry on 12/21/18 at  4:00 PM EDT by telephone and verified that I am speaking with the correct person using two identifiers.   I discussed the limitations, risks, security and privacy concerns of performing an evaluation and management service by telephone and the availability of in person appointments. We discussed the impact of the COVID-19 pandemic on the healthcare system, and the importance of social distancing and reducing patient and provider exposure. I also discussed with the patient that there may be a patient responsible charge related to this service. The patient expressed understanding and agreed to proceed.  Reason for visit: Urinary symptoms  History of Present Illness: Mr. Donald Barry is a 73 year old male who underwent cystoscopy, bladder biopsy, and fulguration on 07/17/2018 for symptoms of dysuria, urgency, frequency, and bladder pain.  Pathology showed chronic cystitis, and his symptoms improved significantly postoperatively.  He has had worsening of his urinary symptoms that have been refractory to oxybutynin.  His primary complaint is nocturia every hour overnight.  He also tried a course of nocdurna which did not improve his nocturia.  He previously failed Flomax, oxybutynin, and Myrbetriq by a different provider.  Notably, he reports significant worsening of his urinary symptoms after having tomatoes recently, as well as some citrus juices.  Overall, he is very bothered by his symptoms and feels that they have worsened almost to the point before surgery.  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  We reviewed this is more common in women than men, however his symptoms are classic.  We reviewed the AUA treatment algorithm at length including pain management strategies, pelvic floor physical therapy, medication options, and considering  repeat cystoscopy in the future.  -Trial of amitriptyline 50 mg nightly -Referral placed for pelvic floor physical therapy -Extensive information on interstitial cystitis provided -Okay to discontinue nocdurna and ditropan -RTC 2 months for symptom check, consider repeat cystoscopy at that time if refractory symptoms   I discussed the assessment and treatment plan with the patient. The patient was provided an opportunity to ask questions and all were answered. The patient agreed with the plan and demonstrated an understanding of the instructions.   The patient was advised to call back or seek an in-person evaluation if the symptoms worsen or if the condition fails to improve as anticipated.  I provided 15 minutes of non-face-to-face time during this encounter.   Billey Co, MD

## 2018-12-22 ENCOUNTER — Telehealth: Payer: Self-pay | Admitting: Family Medicine

## 2018-12-22 NOTE — Telephone Encounter (Signed)
Amitriptyline   PA Case: [id number],  Status: Approved,  Coverage Starts on: 05/11/2018,  Coverage Ends on: 05/19/2019.

## 2018-12-29 ENCOUNTER — Ambulatory Visit: Payer: Medicare HMO | Attending: Urology | Admitting: Physical Therapy

## 2018-12-29 ENCOUNTER — Encounter: Payer: Self-pay | Admitting: Physical Therapy

## 2018-12-29 ENCOUNTER — Other Ambulatory Visit: Payer: Self-pay

## 2018-12-29 DIAGNOSIS — G8929 Other chronic pain: Secondary | ICD-10-CM | POA: Diagnosis present

## 2018-12-29 DIAGNOSIS — M25561 Pain in right knee: Secondary | ICD-10-CM | POA: Insufficient documentation

## 2018-12-29 DIAGNOSIS — M62838 Other muscle spasm: Secondary | ICD-10-CM

## 2018-12-29 DIAGNOSIS — M533 Sacrococcygeal disorders, not elsewhere classified: Secondary | ICD-10-CM | POA: Insufficient documentation

## 2018-12-29 DIAGNOSIS — G4733 Obstructive sleep apnea (adult) (pediatric): Secondary | ICD-10-CM | POA: Insufficient documentation

## 2018-12-29 NOTE — Therapy (Signed)
[place] MAIN [facility] SERVICES [place], [place], [address] Phone: [phone]   Fax:  [phone]  Physical Therapy Evaluation  Patient Details  Name: Sherri Levenhagen MRN: [id number] Date of Birth: [birth year] Referring Provider (PT): Sninisky    Encounter Date: 12/29/2018    Past Medical History:  Diagnosis Date  . Allergic rhinitis, seasonal 06/14/2014  . Allergy   . Arthritis   . Chronic neck pain 06/14/2014  . Complication of anesthesia   . Gastroesophageal reflux disease without esophagitis 06/14/2014  . Hepatic steatosis 04/05/2018  . Hiatal hernia 09/09/2017  . Intermittent left lower quadrant abdominal pain 07/15/2017  . PONV (postoperative nausea and vomiting)   . 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    Past Surgical History:  Procedure Laterality Date  . BACK SURGERY  2008   L4, L5  . COLONOSCOPY    . CYSTOSCOPY WITH BIOPSY N/A 07/17/2018   Procedure: CYSTOSCOPY WITH BLADDER BIOPSY;  Surgeon: Billey Co, MD;  Location: [facility] ORS;  Service: Urology;  Laterality: N/A;  . CYSTOSCOPY WITH FULGERATION N/A 07/17/2018   Procedure: CYSTOSCOPY WITH FULGERATION;  Surgeon: Billey Co, MD;  Location: [facility] ORS;  Service: Urology;  Laterality: N/A;  . ESOPHAGOGASTRODUODENOSCOPY    . HERNIA REPAIR  [id number]   umbilical  . STOMACH SURGERY     FUNDIC GLAND POLYP    There were no vitals filed for this visit.   Subjective Assessment - 12/29/18 1116    Subjective 1) Pt had frequent urination in Jan 2020. Pt went 4-5 x within every 2 hours. Pt had no trouble with initiation. Denied straining with urination/ bowel movements. Frequent urination currently 2x within every 2 hours. Daily fluids intake: 60 fl oz water, 18 fl oz juice, 8 oz coffee, no teas, no alcohol. Pt also notices weak stream with urination. No pelvic / LBP.    2)  Burning bladder pain  2/10, worst 8/10. Worst with eating tomatoes. Pt has cut out  dried cranberries. Pt was Dx with IC in June 2019.  Pt is on IC diet and cutting things out. Sometimes the burning sensation is there all the time and sometimes it is there when he has to urinate. The burning sensation eases of after completing urination.   3) R knee pain for 5-6 years. There is a lack of cartilage inside of the knee. Pt has had cortisone shots.      Pertinent History  Hx umbilical hernia, H6-0 lumbar surgery, Denied falls , falls onto tailbone. R knee with OA with shots. 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 Hobbies: biking every other day 11 miles without flexibilty routine.  Pt's bike seat is cushiony and the contact points are at the sitting bones. Bike has hydraulic system as shock absorption.  Pt has  performed sit ups/ crunches when in the [employer] [place], gardening, yard work.    Patient Stated Goals  eliminate urgency and burning snsation         [facility] PT Assessment - 12/29/18 1130      Assessment   Medical Diagnosis  urgency, burning sensation     Referring Provider (PT)  Sninisky       Precautions   Precautions  None      Restrictions   Weight Bearing Restrictions  No      Balance Screen   Has the patient fallen in the  past 6 months  No      Observation/Other Assessments   Observations  92 cm Leg length B but L ASIS higher       AROM   Overall AROM Comments  shoulder abd 110 deg restricted ( post Tx: 120 deg B)        Strength   Overall Strength Comments  BLE 5/5   , hip abd 3+/5 B with hip flexor overuse      Palpation   Spinal mobility Lumbar with R convex, L convex above lumbar scar, Restrictions of lumbar scar , increased mm tightness interspinal mm L    SI assessment   L iliac crest higher than R ( post Tx: levelled)          External pelvic floor: tenderness at anterior triangle of mm B  Knee: plan to assess at upcoming session          Objective  measurements completed on examination: See above findings.      [place] Adult PT Treatment/Exercise - 12/29/18 1521      Neuro Re-ed    Neuro Re-ed Details   cued for log rolling to minimize DRA, explanation for physiology / anatomy       Manual Therapy   Manual therapy comments  long axis distraction B, rotational mob/ PA mob at sacrum to promote nutation L, STM at interspinals at lumbar L                   PT Long Term Goals - 12/29/18 1512      PT LONG TERM GOAL #1   Title  Pt will increase his score on LEFS from 57 pt to > 67pt in order to demo decreased pain with R knee for QOL    Time  8    Period  Weeks    Target Date  02/23/19      PT LONG TERM GOAL #2   Title  Pt will report decreased urinary frequency from 2x  every 2 hours to < 1 x every 2 hours in order to participate in community activities    Time  4    Period  Weeks    Status  New    Target Date  01/26/19      PT LONG TERM GOAL #3   Title  Pt will report decreased bruning sensation with urination across 75% of the time in order to perform ADLs    Time  6    Period  Weeks    Status  New    Target Date  02/09/19      PT LONG TERM GOAL #4   Title  Pt will demo levelled iliac crest in standing and supine in order to progress to deep core strengthening exercises    Time  2    Period  Weeks    Status  New    Target Date  01/12/19      PT LONG TERM GOAL #5   Title  Pt will demo decreased lumbar mm tightness and increased shoulder abduction from 120 deg to 180 deg in order to have the mobility to minimize Sx    Time  2    Period  Weeks    Status  New    Target Date  01/12/19      Additional Long Term Goals   Additional Long Term Goals  Yes      PT LONG TERM GOAL #6   Title  Pt  will be IND with flexibility routine and strengthening routine to have a well-rounded routine to minimize injuries/ R knee pain with repeated biking.    Time  10    Period  Weeks    Status  New    Target Date  03/09/19              Plan - 12/29/18 1538    Clinical Impression Statement Pt is a 73 yo male who complaints of frequent urination, burning with urination, and R knee pain. These deficits impact his QOL. Pt presents with a higher L iliac crest with slight scoliosis. SIJ mobility was more limited on L with point tenderness PSIS.  Weak hip weakness, poor body mechanics which places strain on the abdominal/pelvic floor mm. Abdominal bulging with diastasis recti was present indicating weakness of deep core mm and abdominal wall. These are deficits that indicate an ineffective intraabdominal pressure system associated with urinary urgency and burning sensation with urination.  Plan to assess R knee as upcoming session and apply regional interdependent approach to his POC to yield greater outcomes given his deficits include lower kinetic chain. Contributing Factors include: Hx umbilical hernia, Interstitial Cystitis  L4-5 lumbar surgery, lack of flexibility routine with long-term and regular endurance biking.  Pt was provided education on etiology of Sx with anatomy, physiology explanation with images along with the benefits of customized pelvic PT Tx based on pt's medical conditions and musculoskeletal deficits.  Explained the physiology of deep core mm coordination and roles of pelvic floor function in urination, defecation, sexual function, and postural control with deep core mm system.   Following Tx today which pt tolerated without complaints, pt demo'd equal alignment of pelvic girdle and increased spinal / shoulder mobility. After training, pt demo'd improved body mechanics with log rolling to minimize straining abdominal wall.    Personal Factors and Comorbidities  Fitness    Examination-Activity Limitations  Toileting    Stability/Clinical Decision Making  Evolving/Moderate complexity    Clinical Decision Making  Moderate    Rehab Potential  Good    PT Frequency  1x / week    PT Duration  --   10    PT Treatment/Interventions  Balance training;Neuromuscular re-education;Manual techniques;Gait training;Moist Heat;Therapeutic activities;Therapeutic exercise;Taping;Stair training;Scar mobilization;Patient/family education    Consulted and Agree with Plan of Care  Patient       Patient will benefit from skilled therapeutic intervention in order to improve the following deficits and impairments:  Decreased endurance, Decreased strength, Decreased mobility, Decreased scar mobility, Increased muscle spasms, Improper body mechanics, Increased fascial restricitons, Decreased coordination, Decreased safety awareness, Decreased activity tolerance, Postural dysfunction, Decreased range of motion, Hypomobility, Pain  Visit Diagnosis: 1. Sacrococcygeal disorders, not elsewhere classified   2. Other muscle spasm   3. Chronic pain of right knee        Problem List Patient Active Problem List   Diagnosis Date Noted  . Obstructive sleep apnea 12/29/2018  . Hepatic steatosis 04/05/2018  . Hiatal hernia 09/09/2017  . Hx of adenomatous colonic polyps 07/15/2017  . Intermittent left lower quadrant abdominal pain 07/15/2017  . Allergic rhinitis, seasonal 06/14/2014  . Chronic neck pain 06/14/2014  . Gastroesophageal reflux disease without esophagitis 06/14/2014    Jerl Mina ,PT, DPT, E-RYT  12/29/2018, 3:42 PM  [place] MAIN [facility] SERVICES [address] [place], [place], [address] Phone: [phone]   Fax:  [phone]  Name: Victorious Kundinger MRN: [id number] Date of Birth: 1945-07-16

## 2018-12-29 NOTE — Patient Instructions (Signed)
Open book (handout)  For increased mobility in thoracic spine and optimize diaphragm/ pelvic floor/ deep ab system "deep core"     Clam Shell 45 Degrees to strengthen hips   Lying with hips and knees bent 45, one pillow between knees and ankles. Lift knee with exhale. Be sure pelvis does not roll backward. Do not arch back. Do 20 times, each leg, 2 times per day.     Complimentary stretch: [employer] _ foot over _ thigh, opposite knee straight  3 breaths  * Keep pelvis levelled with tactile cue with hand under back of hips  * Slide the ankle of the supporting foot out to decrease the angle which can help level the pelvis      Avoid straining pelvic floor, abdominal muscles , spine  Use log rolling technique instead of getting out of bed with your neck or the sit-up     Log rolling into and out of bed   Log rolling into and out of bed If getting out of bed on R side, Bent knees, scoot hips/ shoulder to L  Raise R arm completely overhead, rolling onto armpit  Then lower bent knees to bed to get into complete side lying position  Then drop legs off bed, and push up onto R elbow/forearm, and use L hand to push onto the bed  ____

## 2019-01-05 ENCOUNTER — Other Ambulatory Visit: Payer: Self-pay

## 2019-01-05 ENCOUNTER — Ambulatory Visit: Payer: Medicare HMO | Admitting: Physical Therapy

## 2019-01-05 DIAGNOSIS — M533 Sacrococcygeal disorders, not elsewhere classified: Secondary | ICD-10-CM

## 2019-01-05 DIAGNOSIS — M62838 Other muscle spasm: Secondary | ICD-10-CM

## 2019-01-05 DIAGNOSIS — G8929 Other chronic pain: Secondary | ICD-10-CM

## 2019-01-05 NOTE — Therapy (Signed)
[place] MAIN [facility] SERVICES [address] [place], [place], [address] Phone: [phone]   Fax:  [phone]  Physical Therapy Treatment  Patient Details  Name: Donald Barry MRN: [id number] Date of Birth: 1945/08/21 Referring Provider (PT): Sninisky    Encounter Date: 01/05/2019  PT End of Session - 01/05/19 0909    Visit Number  2    Number of Visits  10    Date for PT Re-Evaluation  03/09/19    PT Start Time  0905    PT Stop Time  1005    PT Time Calculation (min)  60 min       Past Medical History:  Diagnosis Date  . Allergic rhinitis, seasonal 06/14/2014  . Allergy   . Arthritis   . Chronic neck pain 06/14/2014  . Complication of anesthesia   . Gastroesophageal reflux disease without esophagitis 06/14/2014  . Hepatic steatosis 04/05/2018  . Hiatal hernia 09/09/2017  . Intermittent left lower quadrant abdominal pain 07/15/2017  . PONV (postoperative nausea and vomiting)   . 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    Past Surgical History:  Procedure Laterality Date  . BACK SURGERY  2008   L4, L5  . COLONOSCOPY    . CYSTOSCOPY WITH BIOPSY N/A 07/17/2018   Procedure: CYSTOSCOPY WITH BLADDER BIOPSY;  Surgeon: Billey Co, MD;  Location: [facility] ORS;  Service: Urology;  Laterality: N/A;  . CYSTOSCOPY WITH FULGERATION N/A 07/17/2018   Procedure: CYSTOSCOPY WITH FULGERATION;  Surgeon: Billey Co, MD;  Location: [facility] ORS;  Service: Urology;  Laterality: N/A;  . ESOPHAGOGASTRODUODENOSCOPY    . HERNIA REPAIR  0000000   umbilical  . STOMACH SURGERY     FUNDIC GLAND POLYP    There were no vitals filed for this visit.  Subjective Assessment - 01/05/19 0909    Subjective  Pt reports he felt looser after last session. His daughter helped him with his exercises.Felt pain in neck and shoudler pain with open book exercise.  Pt noticed he did not have hip pain on either side after walking laps in his yard putting herbicide  down when typically it would have caused hip pain    Pertinent History  Hx umbilical hernia, [id number] lumbar surgery, Denied falls , falls onto tailbone. R knee with OA with shots. 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 Hobbies: biking every other day 11 miles without  flexibilty routine.  Pt's bike seat is cushiony and the contact points are at the sitting bones. Bike has hydraulic system as shock absorption.  Pt has  performed sit ups/ crunches when in the [employer] [place], gardening, yard work.    Patient Stated Goals  eliminate urgency and burning snsation         [facility] PT Assessment - 01/05/19 0911      AROM   Overall AROM Comments  shoulder 160 deg B       Palpation   Spinal mobility  hypomobility at T10 , tenderness R interspinals tight, hypomobile T2-3      Palpation comment  increased tightness at levator  R > L, medial scap B, B paraspinal                       [facility] Adult PT Treatment/Exercise - 01/05/19 1043      Neuro Re-ed    Neuro Re-ed Details   scapular depression/  retraction, less upper trap overuse       Exercises   Exercises  --   see pt HEP , complimentary stretches to biking, thoracic ext     Modalities   Modalities  Moist Heat      Moist Heat Therapy   Number Minutes Moist Heat  5 Minutes   guided relaxation   Moist Heat Location  --   thoracic, cervical     Manual Therapy   Manual therapy comments  PA mob Grade III at T10, T2-3 , STM/ MWM at  levator  R > L, medial scap B                    PT Long Term Goals - 12/29/18 1512      PT LONG TERM GOAL #1   Title  Pt will increase his score on LEFS from 57 pt to > 67pt in order to demo decreased pain with R knee for QOL    Time  8    Period  Weeks    Target Date  02/23/19      PT LONG TERM GOAL #2   Title  Pt will report decreased urinary frequency from 2x  every 2 hours to < 1 x every 2 hours in  order to participate in community activities    Time  4    Period  Weeks    Status  New    Target Date  01/26/19      PT LONG TERM GOAL #3   Title  Pt will report decreased bruning sensation with urination across 75% of the time in order to perform ADLs    Time  6    Period  Weeks    Status  New    Target Date  02/09/19      PT LONG TERM GOAL #4   Title  Pt will demo levelled iliac crest in standing and supine in order to progress to deep core strengthening exercises    Time  2    Period  Weeks    Status  New    Target Date  01/12/19      PT LONG TERM GOAL #5   Title  Pt will demo decreased lumbar mm tightness and increased shoulder abduction from 120 deg to 180 deg in order to have the mobility to minimize Sx    Time  2    Period  Weeks    Status  New    Target Date  01/12/19      Additional Long Term Goals   Additional Long Term Goals  Yes      PT LONG TERM GOAL #6   Title  Pt will be IND with flexibility routine and strengthening routine to have a well-rounded routine to minimize injuries/ R knee pain with repeated biking.    Time  10    Period  Weeks    Status  New    Target Date  03/09/19      PT LONG TERM GOAL #7   Title  Pt will decrease score on NIH-CPSI from 53% to < 23% in order to restore urinary function    Time  10    Period  Weeks    Status  New    Target Date  03/09/19            Plan - 01/05/19 1208    Clinical Impression Statement  Pt showed increased spinal mobility with decreased mm tightness  and increased shoulder abduction ROM from last session. Required further manual Tx to minimize his complaint of neck and shoulder pain with open book exercise. Pt tolerated manual Tx without increased pain and demo'd increased mobility in T2-3 and T10 along with paraspinal mm which helped pt to perform open book exercise without pain in neck and shoulders. Pt showed IND with stretches to perform with cycling to minimize lack of spinal mobility. Plan to  perform manual technique to decrease diastasis recti next session as pt has achieved further spinal mobility today that is required to the DRA Tx technique. Pt continues to benefit from skilled PT.    Personal Factors and Comorbidities  Fitness    Examination-Activity Limitations  Toileting    Stability/Clinical Decision Making  Evolving/Moderate complexity    Rehab Potential  Good    PT Frequency  1x / week    PT Duration  --   10   PT Treatment/Interventions  Balance training;Neuromuscular re-education;Manual techniques;Gait training;Moist Heat;Therapeutic activities;Therapeutic exercise;Taping;Stair training;Scar mobilization;Patient/family education    Consulted and Agree with Plan of Care  Patient       Patient will benefit from skilled therapeutic intervention in order to improve the following deficits and impairments:  Decreased endurance, Decreased strength, Decreased mobility, Decreased scar mobility, Increased muscle spasms, Improper body mechanics, Increased fascial restricitons, Decreased coordination, Decreased safety awareness, Decreased activity tolerance, Postural dysfunction, Decreased range of motion, Hypomobility, Pain  Visit Diagnosis: Sacrococcygeal disorders, not elsewhere classified  Other muscle spasm  Chronic pain of right knee     Problem List Patient Active Problem List   Diagnosis Date Noted  . Obstructive sleep apnea 12/29/2018  . Hepatic steatosis 04/05/2018  . Hiatal hernia 09/09/2017  . Hx of adenomatous colonic polyps 07/15/2017  . Intermittent left lower quadrant abdominal pain 07/15/2017  . Allergic rhinitis, seasonal 06/14/2014  . Chronic neck pain 06/14/2014  . Gastroesophageal reflux disease without esophagitis 06/14/2014    Jerl Mina ,PT, DPT, E-RYT  01/05/2019, 12:14 PM  [place] MAIN [facility] SERVICES [address] [place], [place], [address] Phone: [phone]   Fax:  [phone]  Name:  Donald Barry MRN: [id number] Date of Birth: 04/03/1946

## 2019-01-05 NOTE — Patient Instructions (Signed)
Stretches for biking before and after  ride on rest break   Elbow circles rolling back, shoulder blades onto back   Side bends with arms overhead   ___   Morning and night  ADD 6 DIRECTIONS OF NECK WITHOUT LFITING HEAD  5 REPS EACH DIRECTION    Add angel wings dragging arm on bed  10reps      Open book

## 2019-01-12 ENCOUNTER — Other Ambulatory Visit: Payer: Self-pay

## 2019-01-12 ENCOUNTER — Ambulatory Visit: Payer: Medicare HMO | Attending: Urology | Admitting: Physical Therapy

## 2019-01-12 DIAGNOSIS — M62838 Other muscle spasm: Secondary | ICD-10-CM | POA: Insufficient documentation

## 2019-01-12 DIAGNOSIS — G8929 Other chronic pain: Secondary | ICD-10-CM | POA: Insufficient documentation

## 2019-01-12 DIAGNOSIS — M533 Sacrococcygeal disorders, not elsewhere classified: Secondary | ICD-10-CM

## 2019-01-12 DIAGNOSIS — M4125 Other idiopathic scoliosis, thoracolumbar region: Secondary | ICD-10-CM | POA: Insufficient documentation

## 2019-01-12 DIAGNOSIS — M25561 Pain in right knee: Secondary | ICD-10-CM | POA: Diagnosis present

## 2019-01-12 NOTE — Patient Instructions (Addendum)
Wear shoe lift in R shoe  ______   Add to morning / night shoulder neck stretches:  Before angel wings:  1) Hook hand on mattress, pull away with elbow and then out away from midline to slide the scapula under  ( stretching armpit muscles that get compressed from riding)   Or when riding:  "Cow face pose ( in yoga)   2) Pat yourself on back/ reaching behind back with hand towel Lengthening around shoulder blade /ribs, stretching opposite pect /front shoulder muscles:   R hand pats back of your band, elbow pointed up to ceiling/ holding hand towel  Roll L shoulder back and down, grab bottom of hand towel Diagonal direction 5 reps  ( elbow is pointed out)  pull gently down 5 reps ( elbow is up to sky)   *Press back of L hand against back , inhale expand back, make sure to not over arch the low back,  Keep chin tucked, press head back as if wall is behind you  __  3)  Cross hands over -punching motion  Thumbs down, punch forward on inhale and notice back ribs expand  and shoulders back     ___  Deepcore level 1 and 2 ( core)  Handout

## 2019-01-12 NOTE — Therapy (Signed)
[place] MAIN [facility] SERVICES [address] [place], [place], [address] Phone: [phone]   Fax:  [phone]  Physical Therapy Treatment  Patient Details  Name: Donald Barry MRN: [id number] Date of Birth: 1945/07/24 Referring Provider (PT): Sninisky    Encounter Date: 01/12/2019  PT End of Session - 01/12/19 1215    Visit Number  3    Number of Visits  10    Date for PT Re-Evaluation  03/09/19    PT Start Time  [id number]    PT Stop Time  [id number]    PT Time Calculation (min)  72 min       Past Medical History:  Diagnosis Date  . Allergic rhinitis, seasonal 06/14/2014  . Allergy   . Arthritis   . Chronic neck pain 06/14/2014  . Complication of anesthesia   . Gastroesophageal reflux disease without esophagitis 06/14/2014  . Hepatic steatosis 04/05/2018  . Hiatal hernia 09/09/2017  . Intermittent left lower quadrant abdominal pain 07/15/2017  . PONV (postoperative nausea and vomiting)   . 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    Past Surgical History:  Procedure Laterality Date  . BACK SURGERY  2008   L4, L5  . COLONOSCOPY    . CYSTOSCOPY WITH BIOPSY N/A 07/17/2018   Procedure: CYSTOSCOPY WITH BLADDER BIOPSY;  Surgeon: Billey Co, MD;  Location: [facility];  Service: Urology;  Laterality: N/A;  . CYSTOSCOPY WITH FULGERATION N/A 07/17/2018   Procedure: CYSTOSCOPY WITH FULGERATION;  Surgeon: Billey Co, MD;  Location: [facility] ORS;  Service: Urology;  Laterality: N/A;  . ESOPHAGOGASTRODUODENOSCOPY    . HERNIA REPAIR  0000000   umbilical  . STOMACH SURGERY     FUNDIC GLAND POLYP    There were no vitals filed for this visit.  Subjective Assessment - 01/12/19 1106    Subjective  Pt reported he was not sure if he was performing his angel wing exericse correctly. Did not do clam shells for 2 days due to R knee pain.  Urinary frequency is dependent on certain foods.    Pertinent History  Hx umbilical hernia, [id number] lumbar  surgery, Denied falls , falls onto tailbone. R knee with OA with shots. 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 Hobbies: biking every other day 11 miles without  flexibilty routine.  Pt's bike seat is cushiony and the contact points are at the sitting bones. Bike has hydraulic system as shock absorption.  Pt has  performed sit ups/ crunches when in the [employer] [place], gardening, yard work.    Patient Stated Goals  eliminate urgency and burning snsation         [facility] PT Assessment - 01/12/19 1113      Observation/Other Assessments   Observations  supine: shoulder abd,  limited scap retraction/ depression  ( post Tx:  scapular retract/depression)        Palpation   Spinal mobility  decreased tightness along back shoulder mm, no hypomobility at previous hypomobile segments,     Palpation comment  increased tightness SA/ rib 7-8 lateral  B, hypomobile T 2 ( deviated R)  R SCM tightness                   [facility] Adult PT Treatment/Exercise - 01/12/19 1113      Ambulation/Gait   Gait Comments  R popliteal crease lower, R shoulder/ pelvis lower than L (  after shoe lift R : no height difference)       Therapeutic Activites    Therapeutic Activities  Other Therapeutic Activities    Other Therapeutic Activities  provided shoe lift in R shoe       Neuro Re-ed    Neuro Re-ed Details   cued for scapular mobility / alignment in new HEP       Modalities   Modalities  Moist Heat      Moist Heat Therapy   Number Minutes Moist Heat  10 Minutes    Moist Heat Location  --   cerivcal, thoracic, during instruction for HEP     Manual Therapy   Manual therapy comments  STM/MWM problem areas noted in assessment                   PT Long Term Goals - 01/12/19 1216      PT LONG TERM GOAL #1   Title  Pt will increase his score on LEFS from 57 pt to > 67pt in order to demo decreased pain with R  knee for QOL    Time  8    Period  Weeks      PT LONG TERM GOAL #2   Title  Pt will report decreased urinary frequency from 2x  every 2 hours to < 1 x every 2 hours in order to participate in community activities    Time  4    Period  Weeks    Status  New      PT LONG TERM GOAL #3   Title  Pt will report decreased burning sensation with urination across 75% of the time in order to perform ADLs    Time  6    Period  Weeks    Status  New      PT LONG TERM GOAL #4   Title  Pt will demo levelled iliac crest in standing and supine in order to progress to deep core strengthening exercises    Time  2    Period  Weeks    Status  New      PT LONG TERM GOAL #5   Title  Pt will demo decreased lumbar mm tightness and increased shoulder abduction from 120 deg to 180 deg in order to have the mobility to minimize Sx    Time  2    Period  Weeks    Status  New      PT LONG TERM GOAL #6   Title  Pt will be IND with flexibility routine and strengthening routine to have a well-rounded routine to minimize injuries/ R knee pain with repeated biking.    Time  10    Period  Weeks    Status  New      PT LONG TERM GOAL #7   Title  Pt will decrease score on NIH-CPSI from 53% to < 23% in order to restore urinary function    Time  10    Period  Weeks    Status  New            Plan - 01/12/19 1216    Clinical Impression Statement  Pt's posture is improving with less thoracic kyphosis , diastasis recti. Further addressed scapular mobility and provided complimentary stretches to minimize mm overuse and thoracic kyphosis from regular cycling. Addressed leg length difference and R knee pain which was corrected with shoe lift. No R knee pain with walking post Tx. Provided cues  for proper deep core technique to minimize overuse of obliques and optimize pelvic floor lengthening. Discussed role of posture and pelvic floor lengthening to minimize urinary frequency and optimize intraabdominal pressure system  for peristalsis.  Plan to assess pudenal nn pathway given cycling Hx. Pt continues to benefit from skilled PT.   Personal Factors and Comorbidities  Fitness    Examination-Activity Limitations  Toileting    Stability/Clinical Decision Making  Evolving/Moderate complexity    Rehab Potential  Good    PT Frequency  1x / week    PT Duration  --   10   PT Treatment/Interventions  Balance training;Neuromuscular re-education;Manual techniques;Gait training;Moist Heat;Therapeutic activities;Therapeutic exercise;Taping;Stair training;Scar mobilization;Patient/family education    Consulted and Agree with Plan of Care  Patient       Patient will benefit from skilled therapeutic intervention in order to improve the following deficits and impairments:  Decreased endurance, Decreased strength, Decreased mobility, Decreased scar mobility, Increased muscle spasms, Improper body mechanics, Increased fascial restricitons, Decreased coordination, Decreased safety awareness, Decreased activity tolerance, Postural dysfunction, Decreased range of motion, Hypomobility, Pain  Visit Diagnosis: Sacrococcygeal disorders, not elsewhere classified  Other muscle spasm  Chronic pain of right knee     Problem List Patient Active Problem List   Diagnosis Date Noted  . Obstructive sleep apnea 12/29/2018  . Hepatic steatosis 04/05/2018  . Hiatal hernia 09/09/2017  . Hx of adenomatous colonic polyps 07/15/2017  . Intermittent left lower quadrant abdominal pain 07/15/2017  . Allergic rhinitis, seasonal 06/14/2014  . Chronic neck pain 06/14/2014  . Gastroesophageal reflux disease without esophagitis 06/14/2014    Jerl Mina ,PT, DPT, E-RYT  01/12/2019, 12:34 PM  [place] MAIN [facility] SERVICES [address] [place], [place], [address] Phone: [phone]   Fax:  [phone]  Name: Donald Barry MRN: [id number] Date of Birth: 04/14/1946

## 2019-01-14 ENCOUNTER — Other Ambulatory Visit: Payer: Self-pay | Admitting: Family Medicine

## 2019-01-14 MED ORDER — AMITRIPTYLINE HCL 50 MG PO TABS: 50.0000 mg | ORAL_TABLET | Freq: Every day | ORAL | 0 refills | Status: DC

## 2019-01-20 ENCOUNTER — Other Ambulatory Visit: Payer: Self-pay

## 2019-01-20 ENCOUNTER — Ambulatory Visit: Payer: Medicare HMO | Admitting: Physical Therapy

## 2019-01-20 DIAGNOSIS — M533 Sacrococcygeal disorders, not elsewhere classified: Secondary | ICD-10-CM | POA: Diagnosis not present

## 2019-01-20 DIAGNOSIS — M62838 Other muscle spasm: Secondary | ICD-10-CM

## 2019-01-20 DIAGNOSIS — G8929 Other chronic pain: Secondary | ICD-10-CM

## 2019-01-20 NOTE — Therapy (Addendum)
[place] MAIN [facility] SERVICES [address] [place], [place], [address] Phone: [phone]   Fax:  [phone]  Physical Therapy Treatment  Patient Details  Name: Donald Barry MRN: [id number] Date of Birth: 07/26/1945 Referring Provider (PT): Sninisky    Encounter Date: 01/20/2019  PT End of Session - 01/20/19 1125    Visit Number  4    Number of Visits  10    Date for PT Re-Evaluation  03/09/19    PT Start Time  0905    PT Stop Time  1000    PT Time Calculation (min)  55 min       Past Medical History:  Diagnosis Date  . Allergic rhinitis, seasonal 06/14/2014  . Allergy   . Arthritis   . Chronic neck pain 06/14/2014  . Complication of anesthesia   . Gastroesophageal reflux disease without esophagitis 06/14/2014  . Hepatic steatosis 04/05/2018  . Hiatal hernia 09/09/2017  . Intermittent left lower quadrant abdominal pain 07/15/2017  . PONV (postoperative nausea and vomiting)   . 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    Past Surgical History:  Procedure Laterality Date  . BACK SURGERY  2008   L4, L5  . COLONOSCOPY    . CYSTOSCOPY WITH BIOPSY N/A 07/17/2018   Procedure: CYSTOSCOPY WITH BLADDER BIOPSY;  Surgeon: Billey Co, MD;  Location: [facility]S;  Service: Urology;  Laterality: N/A;  . CYSTOSCOPY WITH FULGERATION N/A 07/17/2018   Procedure: CYSTOSCOPY WITH FULGERATION;  Surgeon: Billey Co, MD;  Location: [facility] ORS;  Service: Urology;  Laterality: N/A;  . ESOPHAGOGASTRODUODENOSCOPY    . HERNIA REPAIR  0000000   umbilical  . STOMACH SURGERY     FUNDIC GLAND POLYP    There were no vitals filed for this visit.  Subjective Assessment - 01/20/19 0908    Subjective  Pt reported he returned to biking last week and was able to ride one ride. Pt noticed he was abe to turn his neck more. Pt notices at 3/4 of the ride, he notices numbness in both hands. Pt reported burning is still occurring with urination. Since pt  has been stretching, pt has had considerably less radiating LBP down both legs. When he is standing at the [employer] a meal, he notices radiating pain within 20 min. Pt notices neck pain with the neck exercises.      Pertinent History  Hx umbilical hernia, 99991111 lumbar surgery, Denied falls , falls onto tailbone. R knee with OA with shots. 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 Hobbies: biking every other day 11 miles without  flexibilty routine.  Pt's bike seat is cushiony and the contact points are at the sitting bones. Bike has hydraulic system as shock absorption.  Pt has  performed sit ups/ crunches when in the [employer] [place], gardening, yard work.    Patient Stated Goals  eliminate urgency and burning snsation         [facility] PT Assessment - 01/20/19 0921      Observation/Other Assessments   Observations  observed picture of pt on his bike: noted elevated shoulders,       Other:   Other/ Comments  simulated standing position at kitchen counter: unequal weight bearing       PROM   Overall PROM Comments  supine, hip 90-90 position: R hip IR ~30 deg, L 45 deg, ( post Tx: ~  45 deg R )       AROM: cervical rotation L 50 deg, R 70 deg ( post Tx: B AROM 70 deg)          Pelvic Floor Special Questions - 01/20/19 0959    External Perineal Exam  through clothing: increased tenderness and tightness at R ischio/ bulbo/ deep transverse perineal, obt int R          [facility] Adult PT Treatment/Exercise - 01/20/19 1000      Therapeutic Activites    Other Therapeutic Activities  assessed bike mecahnics, posture       Neuro Re-ed    Neuro Re-ed Details   cued for less effort and ab use with breathing coordination with pelvic floor , tactile cue for more equal weight bearing in B lower kinetic chain in semi tandem stance to decrease LBP, cued for less effort before painful range with cervical  ROM HEP  , tactile cues for scapular stabilization when biking       Modalities   Modalities  Moist Heat      Moist Heat Therapy   Number Minutes Moist Heat  5 Minutes    Moist Heat Location  --   each at cervical, perineum through clothing      Manual Therapy   Manual therapy comments  medial glide at C7 R, interspinals/ cervical R with MWM  R . external technique at R pelvic floor ( anterior triangle of mm R ) STM/ MWM with coordinated breathing                   PT Long Term Goals - 01/20/19 0920      PT LONG TERM GOAL #1   Title  Pt will increase his score on LEFS from 57 pt to > 67pt in order to demo decreased pain with R knee for QOL    Time  8    Period  Weeks    Status  On-going      PT LONG TERM GOAL #2   Title  Pt will report decreased urinary frequency from 2x  every 2 hours to < 1 x every 2 hours in order to participate in community activities    Time  4    Period  Weeks    Status  On-going      PT LONG TERM GOAL #3   Title  Pt will report decreased burning sensation with urination across 75% of the time in order to perform ADLs    Time  6    Period  Weeks    Status  On-going      PT LONG TERM GOAL #4   Title  Pt will demo levelled iliac crest in standing and supine in order to progress to deep core strengthening exercises    Time  2    Period  Weeks    Status  Achieved      PT LONG TERM GOAL #5   Title  Pt will demo decreased lumbar mm tightness and increased shoulder abduction from 120 deg to 180 deg in order to have the mobility to minimize Sx    Time  2    Period  Weeks    Status  Achieved      PT LONG TERM GOAL #6   Title  Pt will be IND with flexibility routine and strengthening routine to have a well-rounded routine to minimize injuries/ R knee pain with repeated biking.    Time  10    Period  Weeks    Status  On-going      PT LONG TERM GOAL #7   Title  Pt will decrease score on NIH-CPSI from 53% to < 23% in order to restore urinary  function    Time  10    Period  Weeks    Status  On-going            Plan - 01/20/19 1125    Clinical Impression Statement Regional interdependent approach is effective. Pt's posture has significantly improved, presenting with significantly increased spinal mobility at cervical and thoracic spine and deep core coordination and strength. Shoe lift is helping to maintain equal pelvic girdle. These improvements rendered appropriate time to address pelvic floor mm. Pt demo'd increased R sided tightness which is likely related to leg length difference. Pt tolerated manual Tx to minimize pelvic floor tightness. Further addressed cervical hypomobility with L rotation which is likely associated with scoliosis. Provided hip / back stretches to add to HEP daily and as post-biking flexibility routine which he lacked prior to PT.  Anticipate urinary frequency and burning with urination will decrease as pelvic floor tightness decreases. Also provided scapular stabilization strategies with biking and assessed body /ergonomics from image of pt on his bike. Plan to assess L toe in. Pt continues to benefit from skilled PT.     Personal Factors and Comorbidities  Fitness    Examination-Activity Limitations  Toileting    Stability/Clinical Decision Making  Evolving/Moderate complexity    Rehab Potential  Good    PT Frequency  1x / week    PT Duration  --   10   PT Treatment/Interventions  Balance training;Neuromuscular re-education;Manual techniques;Gait training;Moist Heat;Therapeutic activities;Therapeutic exercise;Taping;Stair training;Scar mobilization;Patient/family education    Consulted and Agree with Plan of Care  Patient       Patient will benefit from skilled therapeutic intervention in order to improve the following deficits and impairments:  Decreased endurance, Decreased strength, Decreased mobility, Decreased scar mobility, Increased muscle spasms, Improper body mechanics, Increased fascial  restricitons, Decreased coordination, Decreased safety awareness, Decreased activity tolerance, Postural dysfunction, Decreased range of motion, Hypomobility, Pain  Visit Diagnosis: Sacrococcygeal disorders, not elsewhere classified  Other muscle spasm  Chronic pain of right knee     Problem List Patient Active Problem List   Diagnosis Date Noted  . Obstructive sleep apnea 12/29/2018  . Hepatic steatosis 04/05/2018  . Hiatal hernia 09/09/2017  . Hx of adenomatous colonic polyps 07/15/2017  . Intermittent left lower quadrant abdominal pain 07/15/2017  . Allergic rhinitis, seasonal 06/14/2014  . Chronic neck pain 06/14/2014  . Gastroesophageal reflux disease without esophagitis 06/14/2014    Jerl Mina ,PT, DPT, E-RYT  01/20/2019, 11:26 AM  [place] MAIN [facility] SERVICES [address] [place], [place], [address] Phone: [phone]   Fax:  [phone]  Name: Donald Barry MRN: [id number] Date of Birth: 17-Sep-1945

## 2019-01-20 NOTE — Patient Instructions (Addendum)
Glut stretches and pelvic floor   _R knee to chest 10 reps   _scoot hip to L and then rock knees side to side  10 reps   _figure 4 with R ankle corssed on L thigh 5 breaths   _ cross thigh over thigh with belt, exhale to bring knee to chest   No lifting head, digging elbows/ shoulder  5 reps    ___   Deep core level 1 ( breathing , pacing )  To apply when feeling frequency after having already emptied   ___   Standing on equal legs 50%,  45 deg to counter to off load on spine   ___ Biking with shoulders sequeezing , down, pushing on handlebars with elbows back

## 2019-01-26 ENCOUNTER — Ambulatory Visit: Payer: Medicare HMO | Admitting: Physical Therapy

## 2019-01-26 ENCOUNTER — Other Ambulatory Visit: Payer: Self-pay

## 2019-01-26 DIAGNOSIS — M533 Sacrococcygeal disorders, not elsewhere classified: Secondary | ICD-10-CM

## 2019-01-26 DIAGNOSIS — M62838 Other muscle spasm: Secondary | ICD-10-CM

## 2019-01-26 DIAGNOSIS — G8929 Other chronic pain: Secondary | ICD-10-CM

## 2019-01-26 NOTE — Patient Instructions (Addendum)
Educated about acute urinary retention to distinguish from low urine volume due to sweating versuse this medical condition taht needs immediate attention     [email or web address]  [email or web address]   ____   Neck   ADD 6 DIRECTIONS OF NECK WITHOUT LFITING HEAD  5 REPS EACH DIRECTION      Add angel wings dragging arm on bed  10reps        Shoulders/ trunk  1) Elbow circles rolling back, shoulder blades onto back    2) Side bends with arms overhead  3) Open book    Pect stretches: align earlobe to shoulder hem        1) doorway , includes hip flexors          2) perpendicular to the wall, temple relaxed, turn feet/ hips away from wall for more gentle pull,    Glut stretches and pelvic floor     1) R knee to chest 10 reps    2) scoot hip to L and then rock knees side to side  10 reps    3) figure 4 with R ankle corssed on L thigh 5 breaths           4) cross thigh over thigh with belt, exhale to bring knee to chest   No lifting head, digging elbows/ shoulder  5 reps               Try increasing 2x week of biking with stretches afterwards   Try different caffeinated teas instead of coffee to prep for cross country travels and document in bladder diary provided

## 2019-01-26 NOTE — Therapy (Signed)
[place] MAIN [facility] SERVICES [address] [place], [place], [address] Phone: [phone]   Fax:  [phone]  Physical Therapy Treatment  Patient Details  Name: Donald Barry MRN: [id number] Date of Birth: 10/21/1945 Referring Provider (PT): Sninisky    Encounter Date: 01/26/2019  PT End of Session - 01/26/19 1156    Visit Number  5    Number of Visits  10    Date for PT Re-Evaluation  03/09/19    PT Start Time  [id number]    PT Stop Time  1205    PT Time Calculation (min)  60 min       Past Medical History:  Diagnosis Date  . Allergic rhinitis, seasonal 06/14/2014  . Allergy   . Arthritis   . Chronic neck pain 06/14/2014  . Complication of anesthesia   . Gastroesophageal reflux disease without esophagitis 06/14/2014  . Hepatic steatosis 04/05/2018  . Hiatal hernia 09/09/2017  . Intermittent left lower quadrant abdominal pain 07/15/2017  . PONV (postoperative nausea and vomiting)   . 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    Past Surgical History:  Procedure Laterality Date  . BACK SURGERY  2008   L4, L5  . COLONOSCOPY    . CYSTOSCOPY WITH BIOPSY N/A 07/17/2018   Procedure: CYSTOSCOPY WITH BLADDER BIOPSY;  Surgeon: Billey Co, MD;  Location: [facility] ORS;  Service: Urology;  Laterality: N/A;  . CYSTOSCOPY WITH FULGERATION N/A 07/17/2018   Procedure: CYSTOSCOPY WITH FULGERATION;  Surgeon: Billey Co, MD;  Location: [facility] ORS;  Service: Urology;  Laterality: N/A;  . ESOPHAGOGASTRODUODENOSCOPY    . HERNIA REPAIR  0000000   umbilical  . STOMACH SURGERY     FUNDIC GLAND POLYP    There were no vitals filed for this visit.  Subjective Assessment - 01/26/19 1108    Subjective  Pt reported R knee aches and found he was doing the knee to chest stretch by pulling the shin which he thinks may have caused the achiness. He is pulling the thigh instead of the shin. Pt also reports he has used the breathing technique to decrease  the urge when it is there. The burning sensation with urination has decreased across the past 4 days. Pt noticed this past week intermittent pulses of the burning pain at tip of his penis which is an improvement compared to the constant burning pain he used to have.  Pt is totally off coffee.  Pt reports urinary volume has not changed but it is not alot. Daily fluid intake: 80 floz of water. Pt has been working outside and sweating alot.  Urine color is pale yellow.    Pertinent History  Hx umbilical hernia, [id number] lumbar surgery, Denied falls , falls onto tailbone. R knee with OA with shots. 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 Hobbies: biking every other day 11 miles without  flexibilty routine.  Pt's bike seat is cushiony and the contact points are at the sitting bones. Bike has hydraulic system as shock absorption.  Pt has  performed sit ups/ crunches when in the [employer] [place], gardening, yard work.    Patient Stated Goals  eliminate urgency and burning snsation         [facility] PT Assessment - 01/26/19 1156      Observation/Other Assessments   Observations  improved posture,       Palpation   Palpation comment  R shoulder with more IR       Bed Mobility   Bed Mobility  --   cued required for log roll                  [facility] Adult PT Treatment/Exercise - 01/26/19 1156      Therapeutic Activites    Other Therapeutic Activities  discussed progression, strategies to minimize urgency when on roadtrip, reorganized HEP for better understanding , explained wth anatomy/ physiology for continued improvements with orthopedic and urinary issues      Neuro Re-ed    Neuro Re-ed Details   reviewed technique with previous HEP                   PT Long Term Goals - 01/26/19 1117      PT LONG TERM GOAL #1   Title  Pt will increase his score on LEFS from 57 pt to > 67pt in order to demo  decreased pain with R knee for QOL    Time  8    Period  Weeks    Status  On-going      PT LONG TERM GOAL #2   Title  Pt will report decreased urinary frequency from 2x  every 2 hours to < 1 x every 2 hours in order to participate in community activities    Time  4    Period  Weeks    Status  Achieved      PT LONG TERM GOAL #3   Title  Pt will report decreased burning sensation with urination across 75% of the time in order to perform ADLs    Time  6    Period  Weeks    Status  Achieved      PT LONG TERM GOAL #4   Title  Pt will demo levelled iliac crest in standing and supine in order to progress to deep core strengthening exercises    Time  2    Period  Weeks    Status  Achieved      PT LONG TERM GOAL #5   Title  Pt will demo decreased lumbar mm tightness and increased shoulder abduction from 120 deg to 180 deg in order to have the mobility to minimize Sx    Time  2    Period  Weeks    Status  Achieved      PT LONG TERM GOAL #6   Title  Pt will be IND with flexibility routine and strengthening routine to have a well-rounded routine to minimize injuries/ R knee pain with repeated biking.    Time  10    Period  Weeks    Status  On-going      PT LONG TERM GOAL #7   Title  Pt will decrease score on NIH-CPSI from 53% to < 23% in order to restore urinary function    Time  10    Period  Weeks    Status  On-going            Plan - 01/26/19 2308    Clinical Impression Statement  Pt is making improvements: _The burning sensation with urination has decreased across the past 4 days.  _Pt noticed this past week intermittent pulses of the burning pain at tip of his penis which is an improvement compared to the constant burning pain he used to have.  Pt is totally off coffee.  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 Explained the S & Sx of acute urinary retention and differentiating when low urine volume can be related to sweating from working outside while still maintaining proper water intake.  Reviewed flexibility routine today and plan to offer modification to stretches so they can be performed at rest stops when pt takes long road trip in 2 weeks. Plan to progress to glut strengthening at next session.  Pt continues to benefit from skilled PT.    Personal Factors and Comorbidities  Fitness    Examination-Activity Limitations  Toileting    Stability/Clinical Decision Making  Evolving/Moderate complexity    Rehab Potential  Good    PT Frequency  1x / week    PT Duration  --   10   PT Treatment/Interventions  Balance training;Neuromuscular re-education;Manual techniques;Gait training;Moist Heat;Therapeutic activities;Therapeutic exercise;Taping;Stair training;Scar mobilization;Patient/family education    Consulted and Agree with Plan of Care  Patient       Patient will benefit from skilled therapeutic intervention in order to improve the following deficits and impairments:  Decreased endurance, Decreased strength, Decreased mobility, Decreased scar mobility, Increased muscle spasms, Improper body mechanics, Increased fascial restricitons, Decreased coordination, Decreased safety awareness, Decreased activity tolerance, Postural dysfunction, Decreased range of motion, Hypomobility, Pain  Visit Diagnosis: Sacrococcygeal disorders, not elsewhere classified  Other muscle spasm  Chronic pain of right knee     Problem List Patient Active Problem List   Diagnosis Date Noted  . Obstructive sleep apnea 12/29/2018  . Hepatic steatosis 04/05/2018  . Hiatal hernia 09/09/2017  . Hx of adenomatous colonic polyps 07/15/2017  . Intermittent left lower quadrant abdominal pain 07/15/2017  . Allergic rhinitis,  seasonal 06/14/2014  . Chronic neck pain 06/14/2014  . Gastroesophageal reflux disease without esophagitis 06/14/2014    Jerl Mina ,PT, DPT, E-RYT  01/26/2019, 11:39 PM  [place] MAIN [facility] SERVICES [address] [place], [place], [address] Phone: [phone]   Fax:  [phone]  Name: Donald Barry MRN: [id number] Date of Birth: 1945/08/11

## 2019-02-03 ENCOUNTER — Ambulatory Visit: Payer: Medicare HMO | Admitting: Physical Therapy

## 2019-02-03 ENCOUNTER — Other Ambulatory Visit: Payer: Self-pay

## 2019-02-03 DIAGNOSIS — M533 Sacrococcygeal disorders, not elsewhere classified: Secondary | ICD-10-CM | POA: Diagnosis not present

## 2019-02-03 DIAGNOSIS — G8929 Other chronic pain: Secondary | ICD-10-CM

## 2019-02-03 DIAGNOSIS — M62838 Other muscle spasm: Secondary | ICD-10-CM

## 2019-02-03 NOTE — Patient Instructions (Addendum)
Knee to chest not so close     Adding  _opp knee and foot stabilized  Hip socket stretch ( knee straight, toes spread, move leg 30 deg past midline and out)   Hamstring stretch : 1) knee/toes straight   2) knee towards armpit     __  walking with higher knees, lower  with ballmounds/midfoot and less heels    __ Bladder 2 hours retraining schedule with timer

## 2019-02-03 NOTE — Therapy (Signed)
[place] MAIN [facility] SERVICES [address] [place], [place], [address] Phone: [phone]   Fax:  [phone]  Physical Therapy Treatment  Patient Details  Name: Donald Barry MRN: [id number] Date of Birth: August 12, 1945 Referring Provider (PT): Sninisky    Encounter Date: 02/03/2019  PT End of Session - 02/03/19 0916    Visit Number  6    Number of Visits  10    Date for PT Re-Evaluation  03/09/19    PT Start Time  0912    PT Stop Time  1010    PT Time Calculation (min)  58 min    Activity Tolerance  Patient tolerated treatment well       Past Medical History:  Diagnosis Date  . Allergic rhinitis, seasonal 06/14/2014  . Allergy   . Arthritis   . Chronic neck pain 06/14/2014  . Complication of anesthesia   . Gastroesophageal reflux disease without esophagitis 06/14/2014  . Hepatic steatosis 04/05/2018  . Hiatal hernia 09/09/2017  . Intermittent left lower quadrant abdominal pain 07/15/2017  . PONV (postoperative nausea and vomiting)   . 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    Past Surgical History:  Procedure Laterality Date  . BACK SURGERY  2008   L4, L5  . COLONOSCOPY    . CYSTOSCOPY WITH BIOPSY N/A 07/17/2018   Procedure: CYSTOSCOPY WITH BLADDER BIOPSY;  Surgeon: Billey Co, MD;  Location: [facility] ORS;  Service: Urology;  Laterality: N/A;  . CYSTOSCOPY WITH FULGERATION N/A 07/17/2018   Procedure: CYSTOSCOPY WITH FULGERATION;  Surgeon: Billey Co, MD;  Location: [facility] ORS;  Service: Urology;  Laterality: N/A;  . ESOPHAGOGASTRODUODENOSCOPY    . HERNIA REPAIR  0000000   umbilical  . STOMACH SURGERY     FUNDIC GLAND POLYP    There were no vitals filed for this visit.  Subjective Assessment - 02/03/19 0914    Subjective  Pt reported the belt stretch causes numbness on L feet and the sides of the calf. Pt has stopped doing this exercises. 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 Tenderness went away in one day. Pt used to postpone urination when working and biking in the past.    Pertinent History  Hx umbilical hernia, [id number] lumbar surgery, Denied falls , falls onto tailbone. R knee with OA with shots. 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 Hobbies: biking every other day 11 miles without  flexibilty routine.  Pt's bike seat is cushiony and the contact points are at the sitting bones. Bike has hydraulic system as shock absorption.  Pt has  performed sit ups/ crunches when in the [employer] [place], gardening, yard work.    Patient Stated Goals  eliminate urgency and burning snsation         [facility] PT Assessment - 02/03/19 0957      Observation/Other Assessments   Observations  knee to chest exercise done incorrectly ( end range) ( post Tx: no numbness with correct form)       Palpation   Palpation comment  peroneal longus/ tib posterior B tightness       Ambulation/Gait   Gait Comments  heel strike, spination not DF/EV on stance                    [facility] Adult PT Treatment/Exercise - 02/03/19  0956      Neuro Re-ed    Neuro Re-ed Details   cued for peropre technique on knee to chest stretch and added more leg stretches       Manual Therapy   Manual therapy comments  peroneal longus/ tib posterior B                   PT Long Term Goals - 01/26/19 1117      PT LONG TERM GOAL #1   Title  Pt will increase his score on LEFS from 57 pt to > 67pt in order to demo decreased pain with R knee for QOL    Time  8    Period  Weeks    Status  On-going      PT LONG TERM GOAL #2   Title  Pt will report decreased urinary frequency from 2x  every 2 hours to < 1 x every 2 hours in order to participate in community activities    Time  4    Period  Weeks    Status  Achieved      PT LONG TERM GOAL #3   Title  Pt will report  decreased burning sensation with urination across 75% of the time in order to perform ADLs    Time  6    Period  Weeks    Status  Achieved      PT LONG TERM GOAL #4   Title  Pt will demo levelled iliac crest in standing and supine in order to progress to deep core strengthening exercises    Time  2    Period  Weeks    Status  Achieved      PT LONG TERM GOAL #5   Title  Pt will demo decreased lumbar mm tightness and increased shoulder abduction from 120 deg to 180 deg in order to have the mobility to minimize Sx    Time  2    Period  Weeks    Status  Achieved      PT LONG TERM GOAL #6   Title  Pt will be IND with flexibility routine and strengthening routine to have a well-rounded routine to minimize injuries/ R knee pain with repeated biking.    Time  10    Period  Weeks    Status  On-going      PT LONG TERM GOAL #7   Title  Pt will decrease score on NIH-CPSI from 53% to < 23% in order to restore urinary function    Time  10    Period  Weeks    Status  On-going            Plan - 02/03/19 1004    Clinical Impression Statement Pt demo'd decreased leg tightness post Tx and required cues for correct technique with knee to chest exercise to resolve numbness in foot/ leg. Further provided gait mechanics to minimize supination and heel strike and cues for not leaning on R hips when sitting. Added bladder retraining as pt used to postpone urination due to work and biking. Pt has found caffeine alternatives that decrease urge and this will be helpful for him when he drives on his road trip.  Pt continues to benefit from skilled PT.   Personal Factors and Comorbidities  Fitness    Examination-Activity Limitations  Toileting    Stability/Clinical Decision Making  Evolving/Moderate complexity    Rehab Potential  Good    PT Frequency  1x /  week    PT Duration  --   10   PT Treatment/Interventions  Balance training;Neuromuscular re-education;Manual techniques;Gait training;Moist  Heat;Therapeutic activities;Therapeutic exercise;Taping;Stair training;Scar mobilization;Patient/family education    Consulted and Agree with Plan of Care  Patient       Patient will benefit from skilled therapeutic intervention in order to improve the following deficits and impairments:  Decreased endurance, Decreased strength, Decreased mobility, Decreased scar mobility, Increased muscle spasms, Improper body mechanics, Increased fascial restricitons, Decreased coordination, Decreased safety awareness, Decreased activity tolerance, Postural dysfunction, Decreased range of motion, Hypomobility, Pain  Visit Diagnosis: Sacrococcygeal disorders, not elsewhere classified  Other muscle spasm  Chronic pain of right knee     Problem List Patient Active Problem List   Diagnosis Date Noted  . Obstructive sleep apnea 12/29/2018  . Hepatic steatosis 04/05/2018  . Hiatal hernia 09/09/2017  . Hx of adenomatous colonic polyps 07/15/2017  . Intermittent left lower quadrant abdominal pain 07/15/2017  . Allergic rhinitis, seasonal 06/14/2014  . Chronic neck pain 06/14/2014  . Gastroesophageal reflux disease without esophagitis 06/14/2014    Jerl Mina ,PT, DPT, E-RYT  02/03/2019, 10:15 AM  [place] MAIN [facility] SERVICES [address] [place], [place], [address] Phone: [phone]   Fax:  [phone]  Name: Orbra Arnott MRN: [id number] Date of Birth: [birth year]

## 2019-02-09 ENCOUNTER — Ambulatory Visit: Payer: Medicare HMO | Admitting: Physical Therapy

## 2019-02-09 ENCOUNTER — Other Ambulatory Visit: Payer: Self-pay

## 2019-02-09 DIAGNOSIS — M62838 Other muscle spasm: Secondary | ICD-10-CM

## 2019-02-09 DIAGNOSIS — G8929 Other chronic pain: Secondary | ICD-10-CM

## 2019-02-09 DIAGNOSIS — M533 Sacrococcygeal disorders, not elsewhere classified: Secondary | ICD-10-CM

## 2019-02-09 DIAGNOSIS — M25561 Pain in right knee: Secondary | ICD-10-CM

## 2019-02-09 NOTE — Therapy (Signed)
[place] MAIN [facility] SERVICES [address] [place], [place], [address] Phone: [phone]   Fax:  [phone]  Physical Therapy Treatment  Patient Details  Name: Donald Barry MRN: [id number] Date of Birth: 05-Apr-1946 Referring Provider (PT): Sninisky    Encounter Date: 02/09/2019  PT End of Session - 02/09/19 1220    Visit Number  7    Number of Visits  10    Date for PT Re-Evaluation  03/09/19    PT Start Time  1100    PT Stop Time  1204    PT Time Calculation (min)  64 min    Activity Tolerance  Patient tolerated treatment well       Past Medical History:  Diagnosis Date  . Allergic rhinitis, seasonal 06/14/2014  . Allergy   . Arthritis   . Chronic neck pain 06/14/2014  . Complication of anesthesia   . Gastroesophageal reflux disease without esophagitis 06/14/2014  . Hepatic steatosis 04/05/2018  . Hiatal hernia 09/09/2017  . Intermittent left lower quadrant abdominal pain 07/15/2017  . PONV (postoperative nausea and vomiting)   . 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    Past Surgical History:  Procedure Laterality Date  . BACK SURGERY  2008   L4, L5  . COLONOSCOPY    . CYSTOSCOPY WITH BIOPSY N/A 07/17/2018   Procedure: CYSTOSCOPY WITH BLADDER BIOPSY;  Surgeon: Billey Co, MD;  Location: [facility] ORS;  Service: Urology;  Laterality: N/A;  . CYSTOSCOPY WITH FULGERATION N/A 07/17/2018   Procedure: CYSTOSCOPY WITH FULGERATION;  Surgeon: Billey Co, MD;  Location: [facility] OR[facility];  Service: Urology;  Laterality: N/A;  . ESOPHAGOGASTRODUODENOSCOPY    . HERNIA REPAIR  0000000   umbilical  . STOMACH SURGERY     FUNDIC GLAND POLYP    There were no vitals filed for this visit.  Subjective Assessment - 02/09/19 1104    Subjective  Pt has tried to urinate once every 1.5 hour. Pt notices the urge coming on when he does the open book exercise in the morning after all the other exercises. Pt was able to relax and calm it  down. Pt is tracking going to the bathroom once within 2 hours.    Pertinent History  Hx umbilical hernia, 99991111 lumbar surgery, Denied falls , falls onto tailbone. R knee with OA with shots. 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 Hobbies: biking every other day 11 miles without  flexibilty routine.  Pt's bike seat is cushiony and the contact points are at the sitting bones. Bike has hydraulic system as shock absorption.  Pt has  performed sit ups/ crunches when in the [employer] [place], gardening, yard work.    Patient Stated Goals  eliminate urgency and burning snsation         [facility] PT Assessment - 02/09/19 1207      Coordination   Gross Motor Movements are Fluid and Coordinated  --   ab overuse with deep core      Floor to Stand   Comments  minor cues for floor <> stand       Palpation   Palpation comment  increased fascial restrictions over suprapubic area/ abdominal mm                    [facility] Adult PT Treatment/Exercise - 02/09/19 1154      Therapeutic Activites    Other  Therapeutic Activities  floor <> stand t/f       Neuro Re-ed    Neuro Re-ed Details   excessive cues for feet propioception, decrease ab overuse with deep core exercise       Manual Therapy   Manual therapy comments  fascial restrictions over lower ab                    PT Long Term Goals - 01/26/19 1117      PT LONG TERM GOAL #1   Title  Pt will increase his score on LEFS from 57 pt to > 67pt in order to demo decreased pain with R knee for QOL    Time  8    Period  Weeks    Status  On-going      PT LONG TERM GOAL #2   Title  Pt will report decreased urinary frequency from 2x  every 2 hours to < 1 x every 2 hours in order to participate in community activities    Time  4    Period  Weeks    Status  Achieved      PT LONG TERM GOAL #3   Title  Pt will report decreased burning sensation with  urination across 75% of the time in order to perform ADLs    Time  6    Period  Weeks    Status  Achieved      PT LONG TERM GOAL #4   Title  Pt will demo levelled iliac crest in standing and supine in order to progress to deep core strengthening exercises    Time  2    Period  Weeks    Status  Achieved      PT LONG TERM GOAL #5   Title  Pt will demo decreased lumbar mm tightness and increased shoulder abduction from 120 deg to 180 deg in order to have the mobility to minimize Sx    Time  2    Period  Weeks    Status  Achieved      PT LONG TERM GOAL #6   Title  Pt will be IND with flexibility routine and strengthening routine to have a well-rounded routine to minimize injuries/ R knee pain with repeated biking.    Time  10    Period  Weeks    Status  On-going      PT LONG TERM GOAL #7   Title  Pt will decrease score on NIH-CPSI from 53% to < 23% in order to restore urinary function    Time  10    Period  Weeks    Status  On-going            Plan - 02/09/19 1219    Clinical Impression Statement  Pt is improving with bladder retraining to empty and is now going less frequently ( once every 1.5 hours) . Pt is able to use the relaxation technique to minimize urge. Pt demo'd good carry over with  No more pelvic floor tightness and now working to improve coordination.  Pt tolerated manual Tx today without complaints over abdominal wall. Fascial releases over suprapubic area and umbilicus promoted more motility. Excessive cues needed to minimize overuse of ab mm and optimal diaphragm and pelvic floor.  Provided floor<> stand t/ technique which pt demo'd proper flexibility to perform properly and no difficulty. Provided stretches to perform during  his rest stiop breaks during his road trip. Required cues for  feet propioception in HEP. Pt is IND with his HEP and bladder techniques while traveling on his road trip. When he returns, pt will require more regional interdependent approach to  address feet and balance deficits in order to progress with fitness phase in his program .   Pt continues to benefit from skilled PT.     Personal Factors and Comorbidities  Fitness    Examination-Activity Limitations  Toileting    Stability/Clinical Decision Making  Evolving/Moderate complexity    Rehab Potential  Good    PT Frequency  1x / week    PT Duration  --   10   PT Treatment/Interventions  Balance training;Neuromuscular re-education;Manual techniques;Gait training;Moist Heat;Therapeutic activities;Therapeutic exercise;Taping;Stair training;Scar mobilization;Patient/family education    Consulted and Agree with Plan of Care  Patient       Patient will benefit from skilled therapeutic intervention in order to improve the following deficits and impairments:  Decreased endurance, Decreased strength, Decreased mobility, Decreased scar mobility, Increased muscle spasms, Improper body mechanics, Increased fascial restricitons, Decreased coordination, Decreased safety awareness, Decreased activity tolerance, Postural dysfunction, Decreased range of motion, Hypomobility, Pain  Visit Diagnosis: Sacrococcygeal disorders, not elsewhere classified  Other muscle spasm  Chronic pain of right knee     Problem List Patient Active Problem List   Diagnosis Date Noted  . Obstructive sleep apnea 12/29/2018  . Hepatic steatosis 04/05/2018  . Hiatal hernia 09/09/2017  . Hx of adenomatous colonic polyps 07/15/2017  . Intermittent left lower quadrant abdominal pain 07/15/2017  . Allergic rhinitis, seasonal 06/14/2014  . Chronic neck pain 06/14/2014  . Gastroesophageal reflux disease without esophagitis 06/14/2014    Jerl Mina ,PT, DPT, E-RYT  02/09/2019, 12:21 PM  [place] MAIN [facility] SERVICES [address] [place], [place], [address] Phone: [phone]   Fax:  [phone]  Name: Donald Barry MRN: [id number] Date of Birth: 01-28-1946

## 2019-02-09 NOTE — Patient Instructions (Signed)
Practice proper pelvic floor coordination  Inhale: expand pelvic floor muscles  _ do not push the belly out , focus on natural widening of ribs and tree rings imagery  Exhale" "j" scoop, allow pelvic floor to close, lift first before belly sinks   ( not "draw abdominal muscle to spine" or strain with abdominal muscles")    ___  Spread toes on the belt in hamstring stretches  Pay attention to points of contact of opp feet and knee aligned, opp hip    __  Add to your rest stop stretches : 1) hip flexor/ pect  at the corner of door  2) heel raises 20 reps  3) seated figure 4   4)  L Quad stretch at bench with more pressure on ballmounds of R standing foot, and R arm up and look up   Repeat on other side

## 2019-02-17 ENCOUNTER — Encounter: Payer: Medicare HMO | Admitting: Physical Therapy

## 2019-02-22 ENCOUNTER — Ambulatory Visit: Payer: Medicare HMO | Admitting: Urology

## 2019-02-23 ENCOUNTER — Ambulatory Visit: Payer: Medicare HMO | Admitting: Physical Therapy

## 2019-03-02 ENCOUNTER — Other Ambulatory Visit: Payer: Self-pay

## 2019-03-02 ENCOUNTER — Ambulatory Visit: Payer: Medicare HMO | Attending: Urology | Admitting: Physical Therapy

## 2019-03-02 DIAGNOSIS — R2689 Other abnormalities of gait and mobility: Secondary | ICD-10-CM | POA: Insufficient documentation

## 2019-03-02 DIAGNOSIS — M533 Sacrococcygeal disorders, not elsewhere classified: Secondary | ICD-10-CM

## 2019-03-02 DIAGNOSIS — M62838 Other muscle spasm: Secondary | ICD-10-CM | POA: Diagnosis present

## 2019-03-02 DIAGNOSIS — G8929 Other chronic pain: Secondary | ICD-10-CM | POA: Diagnosis present

## 2019-03-02 DIAGNOSIS — M25561 Pain in right knee: Secondary | ICD-10-CM | POA: Diagnosis present

## 2019-03-02 NOTE — Therapy (Signed)
[place] MAIN [facility] SERVICES [address] [place], [place], [address] Phone: [phone]   Fax:  [phone]  Physical Therapy Treatment / Progress Note   Patient Details  Name: Donald Barry MRN: [id number] Date of Birth: 1945-06-02 Referring Provider (PT): Sninisky    Encounter Date: 03/02/2019  PT End of Session - 03/02/19 1319    Visit Number  8    Number of Visits     Date for PT Re-Evaluation  05/11/19    PT Start Time  1304    PT Stop Time  1402    PT Time Calculation (min)  58 min    Activity Tolerance  Patient tolerated treatment well       Past Medical History:  Diagnosis Date  . Allergic rhinitis, seasonal 06/14/2014  . Allergy   . Arthritis   . Chronic neck pain 06/14/2014  . Complication of anesthesia   . Gastroesophageal reflux disease without esophagitis 06/14/2014  . Hepatic steatosis 04/05/2018  . Hiatal hernia 09/09/2017  . Intermittent left lower quadrant abdominal pain 07/15/2017  . PONV (postoperative nausea and vomiting)   . 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    Past Surgical History:  Procedure Laterality Date  . BACK SURGERY  2008   L4, L5  . COLONOSCOPY    . CYSTOSCOPY WITH BIOPSY N/A 07/17/2018   Procedure: CYSTOSCOPY WITH BLADDER BIOPSY;  Surgeon: Billey Co, MD;  Location: [facility] ORS;  Service: Urology;  Laterality: N/A;  . CYSTOSCOPY WITH FULGERATION N/A 07/17/2018   Procedure: CYSTOSCOPY WITH FULGERATION;  Surgeon: Billey Co, MD;  Location: [facility] ORS;  Service: Urology;  Laterality: N/A;  . ESOPHAGOGASTRODUODENOSCOPY    . HERNIA REPAIR  0272   umbilical  . STOMACH SURGERY     FUNDIC GLAND POLYP    There were no vitals filed for this visit.  Subjective Assessment - 03/02/19 1307    Subjective  Pt reported his trip to his son and grandkids went well. Frequency occurred every hours and a half. On the way back, the frequency increased with eating foods that he typically  does not eat.  Pt feels set back by 30-40% from the trip.  His B feet numbness resolved by 80%.  Pt's R knee is normal now and no longer hurts. Pt still has neck soreness when laying flat on his back to perform his exercises.    Pertinent History  Hx umbilical hernia, Z3-6 lumbar surgery, Denied falls , falls onto tailbone. R knee with OA with shots. 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 Hobbies: biking every other day 11 miles without  flexibilty routine.  Pt's bike seat is cushiony and the contact points are at the sitting bones. Bike has hydraulic system as shock absorption.  Pt has  performed sit ups/ crunches when in the [employer] [place], gardening, yard work.    Patient Stated Goals  eliminate urgency and burning snsation         [facility] PT Assessment - 03/02/19 1401      Observation/Other Assessments   Observations  more upright posture, less rounded shoulders      Palpation   Spinal mobility  hypomobile segment T7-8, wincing pain, slightly deviated L, interspinal mm tightness B                 Pelvic Floor Special Questions - 03/02/19 1402    External Perineal  Exam  through clothing:no pelvic floor mm tightness B         [facility] Adult PT Treatment/Exercise - 03/02/19 1400      Therapeutic Activites    Other Therapeutic Activities  reassessment       Manual Therapy   Manual therapy comments  R medial glide at T7-8 , distraction ,  PA mob Grade III interspinals B                   PT Long Term Goals - 03/02/19 1308      PT LONG TERM GOAL #1   Title  Pt will increase his score on LEFS from 57 pt to > 67pt in order to demo decreased pain with R knee for QOL ( 10/20: 68 pts)    Time  8    Period  Weeks    Status  Achieved      PT LONG TERM GOAL #2   Title  Pt will report decreased urinary frequency from 2x  every 2 hours to < 1 x every 2 hours in order to participate in  community activities    Time  4    Period  Weeks    Status  Achieved      PT LONG TERM GOAL #3   Title  Pt will report decreased burning sensation with urination across 75% of the time in order to perform ADLs    Time  6    Period  Weeks    Status  Achieved      PT LONG TERM GOAL #4   Title  Pt will demo levelled iliac crest in standing and supine in order to progress to deep core strengthening exercises    Time  2    Period  Weeks    Status  Achieved      PT LONG TERM GOAL #5   Title  Pt will demo decreased lumbar mm tightness and increased shoulder abduction from 120 deg to 180 deg in order to have the mobility to minimize Sx    Time  2    Period  Weeks    Status  Achieved      PT LONG TERM GOAL #6   Title  Pt will be IND with flexibility routine and strengthening routine to have a well-rounded routine to minimize injuries/ R knee pain with repeated biking.    Time  10    Period  Weeks    Status  On-going      PT LONG TERM GOAL #7   Title  Pt will decrease score on NIH-CPSI from 53% to < 23% in order to restore urinary function  ( 02/09/19: 37%)    Time  10    Period  Weeks    Status  Partially Met            Plan - 03/02/19 1320    Clinical Impression Statement Pt has achieved 5/8 goals and is progressng well towards remaining goals. Prior to a 2 week roadtrip to visit his son and grandkids in another state, pt had made improvements that include:  _increased intraabdominal pressure system with increased mobility in spinal, deep core core coordination, decreased spinal mm tensions , more upright posture, less thoracic kyphosis  _improved scores on NIH-CPSI and LEFS incdicating improved pelvic floor function and less knee pain  _proper pelvic floor lengthening and able to self correct with dysynergia of pelvic floor / overuse of abdominal mm  _demo'd proper body  mechanics with against-gravity tasks, lifting, sit to stand  Remaining issues pt that still require  skilled PT include:  _ neck soreness when laying flat on his back to perform his exercises which is contributing to his rounded shoulders from poor posture and long term cycling posture Following Tx today that addressed hypomobility at thoracic region, pt reported no more pain with lying on his back which will yield more outcome with HEP.   Theresia Majors of burning with urination and frequency occurred on the latter part of his trip.   Pt feels set back by 30-40% from the trip.  Pt reported he performed the flexibility exercises during rest stops with long drive ont he trip but did not get to do all his HEP. Pt also stated he eat spicy foods on the trip which may have contributed to his Sx. Today's assessment showed no relapse of overactivity of pelvic floor mm nor tightness of abdominal/ back mm. Plan to wait and see how pt fares upon return to his HEP and to continue addressing his thoracic spine hypomobility and progress to fitness exercises as pt states he has not gone back to the gym and enjoys strength training. Plan to provide alternative to fitness/ strengthening that he can perform at home in order for pt to maintain benefits he has made with PT.       Personal Factors and Comorbidities  Fitness    Examination-Activity Limitations  Toileting    Stability/Clinical Decision Making  Evolving/Moderate complexity    Rehab Potential  Good    PT Frequency  1x / week    PT Duration  --   10   PT Treatment/Interventions  Balance training;Neuromuscular re-education;Manual techniques;Gait training;Moist Heat;Therapeutic activities;Therapeutic exercise;Taping;Stair training;Scar mobilization;Patient/family education    Consulted and Agree with Plan of Care  Patient       Patient will benefit from skilled therapeutic intervention in order to improve the following deficits and impairments:  Decreased endurance, Decreased strength, Decreased mobility, Decreased scar mobility, Increased muscle spasms,  Improper body mechanics, Increased fascial restricitons, Decreased coordination, Decreased safety awareness, Decreased activity tolerance, Postural dysfunction, Decreased range of motion, Hypomobility, Pain  Visit Diagnosis: Sacrococcygeal disorders, not elsewhere classified  Other muscle spasm  Chronic pain of right knee     Problem List Patient Active Problem List   Diagnosis Date Noted  . Obstructive sleep apnea 12/29/2018  . Hepatic steatosis 04/05/2018  . Hiatal hernia 09/09/2017  . Hx of adenomatous colonic polyps 07/15/2017  . Intermittent left lower quadrant abdominal pain 07/15/2017  . Allergic rhinitis, seasonal 06/14/2014  . Chronic neck pain 06/14/2014  . Gastroesophageal reflux disease without esophagitis 06/14/2014    Jerl Mina ,PT, DPT, E-RYT  03/02/2019, 2:04 PM  [place] MAIN [facility] SERVICES [address] [place], [place], [address] Phone: [phone]   Fax:  [phone]  Name: Harrington Jobe MRN: [id number] Date of Birth: Feb 13, 1946

## 2019-03-04 ENCOUNTER — Other Ambulatory Visit: Payer: Self-pay

## 2019-03-04 ENCOUNTER — Ambulatory Visit: Payer: Medicare HMO | Admitting: Urology

## 2019-03-04 ENCOUNTER — Ambulatory Visit (INDEPENDENT_AMBULATORY_CARE_PROVIDER_SITE_OTHER): Payer: Medicare HMO | Admitting: Urology

## 2019-03-04 VITALS — BP 125/73 | HR 111 | Ht 72.0 in | Wt 186.0 lb

## 2019-03-04 DIAGNOSIS — N3010 Interstitial cystitis (chronic) without hematuria: Secondary | ICD-10-CM | POA: Diagnosis not present

## 2019-03-04 MED ORDER — CIMETIDINE 400 MG PO TABS: 400.0000 mg | ORAL_TABLET | Freq: Two times a day (BID) | ORAL | 12 refills | Status: DC

## 2019-03-04 NOTE — Patient Instructions (Signed)
Interstitial Cystitis  Interstitial cystitis is inflammation of the bladder. This may cause pain in the bladder area as well as a frequent and urgent need to urinate. The bladder is a hollow organ in the lower part of the abdomen. It stores urine after the urine is made in the kidneys. The severity of interstitial cystitis can vary from person to person. You may have flare-ups, and then your symptoms may go away for a while. For many people, it becomes a long-term (chronic) problem. What are the causes? The cause of this condition is not known. What increases the risk? The following factors may make you more likely to develop this condition:  You are male.  You have fibromyalgia.  You have irritable bowel syndrome (IBS).  You have endometriosis. This condition may be aggravated by:  Stress.  Smoking.  Spicy foods. What are the signs or symptoms? Symptoms of interstitial cystitis vary, and they can change over time. Symptoms may include:  Discomfort or pain in the bladder area, which is in the lower abdomen. Pain can range from mild to severe. The pain may change in intensity as the bladder fills with urine or as it empties.  Pain in the pelvic area, between the hip bones.  An urgent need to urinate.  Frequent urination.  Pain during urination.  Pain during sex.  Blood in the urine. For women, symptoms often get worse during menstruation. How is this diagnosed? This condition is diagnosed based on your symptoms, your medical history, and a physical exam. You may have tests to rule out other conditions, such as:  Urine tests.  Cystoscopy. For this test, a tool similar to a very thin telescope is used to look into your bladder.  Biopsy. This involves taking a sample of tissue from the bladder to be examined under a microscope. How is this treated? There is no cure for this condition, but treatment can help you control your symptoms. Work closely with your health care  provider to find the most effective treatments for you. Treatment options may include:  Medicines to relieve pain and reduce how often you feel the need to urinate.  Learning ways to control when you urinate (bladder training).  Lifestyle changes, such as changing your diet or taking steps to control stress.  Using a device that provides electrical stimulation to your nerves, which can relieve pain (neuromodulation therapy). The device is placed on your back, where it blocks the nerves that cause you to feel pain in your bladder area.  A procedure that stretches your bladder by filling it with air or fluid.  Surgery. This is rare. It is only done for extreme cases, if other treatments do not help. Follow these instructions at home: Bladder training   Use bladder training techniques as directed. Techniques may include: ? Urinating at scheduled times. ? Training yourself to delay urination. ? Doing exercises (Kegel exercises) to strengthen the muscles that control urine flow.  Keep a bladder diary. ? Write down the times that you urinate and any symptoms that you have. This can help you find out which foods, liquids, or activities make your symptoms worse. ? Use your bladder diary to schedule bathroom trips. If you are away from home, plan to be near a bathroom at each of your scheduled times.  Make sure that you urinate just before you leave the house and just before you go to bed. Eating and drinking  Make dietary changes as recommended by your health care provider. You  may need to avoid: ? Spicy foods. ? Foods that contain a lot of potassium.  Limit your intake of beverages that make you need to urinate. These include: ? Caffeinated beverages like soda, coffee, and tea. ? Alcohol. General instructions  Take over-the-counter and prescription medicines only as told by your health care provider.  Do not drink alcohol.  You can try a warm or cool compress over your bladder for  comfort.  Avoid wearing tight clothing.  Do not use any products that contain nicotine or tobacco, such as cigarettes and e-cigarettes. If you need help quitting, ask your health care provider.  Keep all follow-up visits as told by your health care provider. This is important. Contact a health care provider if you have:  Symptoms that do not get better with treatment.  Pain or discomfort that gets worse.  More frequent urges to urinate.  A fever. Get help right away if:  You have no control over when you urinate. Summary  Interstitial cystitis is inflammation of the bladder.  This condition may cause pain in the bladder area as well as a frequent and urgent need to urinate.  You may have flare-ups of the condition, and then it may go away for a while. For many people, it becomes a long-term (chronic) problem.  There is no cure for interstitial cystitis, but treatment methods are available to control your symptoms. This information is not intended to replace advice given to you by your health care provider. Make sure you discuss any questions you have with your health care provider. Document Released: 12/29/2003 Document Revised: 04/11/2017 Document Reviewed: 03/24/2017 Elsevier Patient Education  2020 [place] for Interstitial Cystitis Interstitial cystitis (IC) is a long-term (chronic) condition that causes pain and pressure in the bladder, the lower abdomen, and the pelvic area. Other symptoms of IC include urinary urgency and frequency. Symptoms tend to come and go. Many people with IC find that certain foods trigger their symptoms. Different foods may be problematic for different people. Some foods are more likely to cause symptoms than others. Learning which foods bother you and which do not can help you come up with an eating plan to manage IC. What are tips for following this plan? You may find it helpful to work with a dietitian. This health care  provider can help you develop your eating plan by doing an elimination diet, which involves these steps:  Start with a list of foods that you think trigger your IC symptoms along with the foods that most commonly trigger symptoms for many people with IC.  Eliminate those foods from your diet for about one month, then start reintroducing the foods one at a time to see which ones trigger your symptoms.  Make a list of the foods that trigger your symptoms. It may take several months to find out which foods bother you. Reading food labels Once you know which foods trigger your IC symptoms, you can avoid them. However, it is also a good idea to read food labels because some foods that trigger your symptoms may be included as ingredients in other foods. These ingredients may include:  Chili peppers.  Tomato products.  Soy.  Worcestershire sauce.  Vinegar.  Alcohol.  Citrus flavors or juices.  Artificial sweeteners.  Monosodium glutamate. Shopping  Shopping can be a challenge if many foods trigger your IC. When you go grocery shopping, bring a list of the foods you can eat.  You can get an  app for your phone that lets you know which foods are the safest and which you may want to avoid. You can find the app at the Interstitial Cystitis Network website: www.ic-network.com Meal planning  Plan your meals according to the results of your elimination diet. If you have not done an elimination diet, plan meals according to IC food lists recommended by your health care provider or dietitian. These lists tell you which foods are least and most likely to cause symptoms.  Avoid certain types of food when you go out to eat, such as pizza and foods typically served at [place], [place], and [place]. These foods often contain ingredients that can aggravate IC. General information Here are some general guidelines for an IC eating plan:  Do not eat large portions.  Drink plenty of fluids  with your meals.  Do not eat foods that are high in sugar, salt, or saturated fat.  Choose whole fruits instead of juice.  Eat a colorful variety of vegetables. What foods should I eat? For people with IC, the best diet is a balanced one that includes things from all the food groups. Even if you have to avoid certain foods, there are still plenty of healthy choices in each group. The following are some foods that are least bothersome and may be safest to eat: Fruits Bananas. Blueberries and blueberry juice. Melons. Pears. Apples. Dates. Prunes. Raisins. Apricots. Vegetables Asparagus. Avocado. Celery. Beets. Bell peppers. Black olives. Broccoli. Brussels sprouts. Cabbage. Carrots. Cauliflower. Cucumber. Eggplant. Green beans. Potatoes. Radishes. Spinach. Squash. Turnips. Zucchini. Mushrooms. Peas. Grains Oats. Rice. Bran. Oatmeal. Whole wheat bread. Meats and other proteins Beef. Fish and other seafood. Eggs. Nuts. Peanut butter. Pork. Poultry. Lamb. Garbanzo beans. Pinto beans. Dairy Whole or low-fat milk. American, mozzarella, mild cheddar, feta, ricotta, and cream cheeses. The items listed above may not be a complete list of foods and beverages you can eat. Contact a dietitian for more information. What foods should I avoid? You should avoid any foods that seem to trigger your symptoms. It is also a good idea to avoid foods that are most likely to cause symptoms in many people with IC. These include the following: Fruits Citrus fruits, including lemons, limes, oranges, and grapefruit. Cranberries. Strawberries. Pineapple. Kiwi. Vegetables Chili peppers. Onions. Sauerkraut. Tomato and tomato products. Angie Fava. Grains You do not need to avoid any type of grain unless it triggers your symptoms. Meats and other proteins Precooked or cured meats, such as sausages or meat loaves. Soy products. Dairy Chocolate ice cream. Processed cheese. Yogurt. Beverages Alcohol. Chocolate drinks.  Coffee. Cranberry juice. Carbonated drinks. Tea (black, green, or herbal). Tomato juice. Sports drinks. The items listed above may not be a complete list of foods and beverages you should avoid. Contact a dietitian for more information. Summary  Many people with IC find that certain foods trigger their symptoms. Different foods may be problematic for different people. Some foods are more likely to cause symptoms than others.  You may find it helpful to work with a dietitian to do an elimination diet and come up with an eating plan that is right for you.  Plan your meals according to the results of your elimination diet. If you have not done an elimination diet, plan your meals using IC food lists. These lists tell you which foods are least and most likely to cause symptoms.  The best diet for people with IC is a balanced diet that includes foods from all the food groups. Even  if you have to avoid certain foods, there are still plenty of healthy choices in each group. This information is not intended to replace advice given to you by your health care provider. Make sure you discuss any questions you have with your health care provider. Document Released: 01/01/2018 Document Revised: 08/20/2018 Document Reviewed: 01/01/2018 Elsevier Patient Education  2020 [employer].

## 2019-03-04 NOTE — Progress Notes (Signed)
   03/04/2019 4:08 PM   Donald Barry 05/04/1946 [id number]  Reason for visit: Follow up interstitial cystitis  HPI: I saw Donald Barry back in urology clinic for interstitial cystitis.  He is a very nice 73 year old male who initially presented in early 2020 with severe dysuria, urgency, frequency, and nocturia.  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  He had significant improvement in his urinary symptoms postop, but these recurred a few months later.  He has previously failed trials of Flomax, oxybutynin, and Myrbetriq from a prior provider.  His IC is currently managed with amitriptyline 50 mg nightly, pelvic floor physical therapy, and careful diet regulation.  He feels like he is doing well overall.  His dysuria is completely resolved, and his nocturia is improved significantly.  His primary complaint is ongoing urgency during the day.  We had a long conversation about the chronic nature of interstitial cystitis.  We discussed options moving forward including observation, addition of cimetidine 400 mg twice daily, cystoscopy to evaluate for recurrent ulcerations, or referral to Dr. Amalia Hailey at [facility] for further IC treatment options.  -He opted for a trial of cimetidine twice daily -Virtual visit 6 weeks -Consider clinic cystoscopy in the future to evaluate for recurrent ulcerative lesions  A total of 15 minutes were spent face-to-face with the patient, greater than 50% was spent in patient education, counseling, and coordination of care regarding interstitial cystitis and treatment options.  Billey Co, [facility] [address], [place], [place] [address] [phone]

## 2019-03-09 ENCOUNTER — Other Ambulatory Visit: Payer: Self-pay

## 2019-03-09 ENCOUNTER — Ambulatory Visit: Payer: Medicare HMO | Admitting: Physical Therapy

## 2019-03-09 DIAGNOSIS — M533 Sacrococcygeal disorders, not elsewhere classified: Secondary | ICD-10-CM | POA: Diagnosis not present

## 2019-03-09 DIAGNOSIS — M62838 Other muscle spasm: Secondary | ICD-10-CM

## 2019-03-09 DIAGNOSIS — M25561 Pain in right knee: Secondary | ICD-10-CM

## 2019-03-09 DIAGNOSIS — G8929 Other chronic pain: Secondary | ICD-10-CM

## 2019-03-09 NOTE — Patient Instructions (Addendum)
Segmental mobility of the spine:  Table top wrists under shoudlers, knees under hips  Cat: belly button draws up to ceiling, midback hunches, chin tuck Cow: chin away, shoulders blader sink, tailbone up   __  body scan

## 2019-03-09 NOTE — Therapy (Signed)
[place] MAIN [facility] SERVICES [address] [place], [place], [address] Phone: [phone]   Fax:  [phone]  Physical Therapy Treatment  Patient Details  Name: Donald Barry MRN: [id number] Date of Birth: [birth year] Referring Provider (PT): Sninisky    Encounter Date: 03/09/2019  PT End of Session - 03/09/19 1057    Visit Number  9    Number of Visits  10    Date for PT Re-Evaluation  05/11/19    PT Start Time  1000    PT Stop Time  1100    PT Time Calculation (min)  60 min    Activity Tolerance  Patient tolerated treatment well       Past Medical History:  Diagnosis Date  . Allergic rhinitis, seasonal 06/14/2014  . Allergy   . Arthritis   . Chronic neck pain 06/14/2014  . Complication of anesthesia   . Gastroesophageal reflux disease without esophagitis 06/14/2014  . Hepatic steatosis 04/05/2018  . Hiatal hernia 09/09/2017  . Intermittent left lower quadrant abdominal pain 07/15/2017  . PONV (postoperative nausea and vomiting)   . 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    Past Surgical History:  Procedure Laterality Date  . BACK SURGERY  2008   L4, L5  . COLONOSCOPY    . CYSTOSCOPY WITH BIOPSY N/A 07/17/2018   Procedure: CYSTOSCOPY WITH BLADDER BIOPSY;  Surgeon: Billey Co, MD;  Location: [facility] ORS;  Service: Urology;  Laterality: N/A;  . CYSTOSCOPY WITH FULGERATION N/A 07/17/2018   Procedure: CYSTOSCOPY WITH FULGERATION;  Surgeon: Billey Co, MD;  Location: [facility] ORS;  Service: Urology;  Laterality: N/A;  . ESOPHAGOGASTRODUODENOSCOPY    . HERNIA REPAIR  [id number]   umbilical  . STOMACH SURGERY     FUNDIC GLAND POLYP    There were no vitals filed for this visit.  Subjective Assessment - 03/09/19 1005    Subjective  Pt compensates with a pillow under R shoulder and neck to perform his HEP because the neck muscles still stays tight. Pt has been working outside and sweating alot and it having a hard time  gauging because he is drinking more water. Pt feels the urge to urinate at 5% of the day and using the urge suppression and it works sometimes.    Pertinent History  Hx umbilical hernia, L4-6 lumbar surgery, Denied falls , falls onto tailbone. R knee with OA with shots. 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 Hobbies: biking every other day 11 miles without  flexibilty routine.  Pt's bike seat is cushiony and the contact points are at the sitting bones. Bike has hydraulic system as shock absorption.  Pt has  performed sit ups/ crunches when in the [employer] [place], gardening, yard work.    Patient Stated Goals  eliminate urgency and burning snsation         [facility] PT Assessment - 03/09/19 1006      AROM   Overall AROM   --    Overall AROM Comments cervical R sideflexion 30deg, L 25 deg  (post Tx:  side flexion 40 deg B)   cervical flexion/ ext 50 deg  R rotation 45 deg, L 55 deg   ( post Tx:  R 65 deg, L 70 deg)      AROM Assessment Site  --   shoulder abduction to B 180 deg but compensates upper trap  Right/Left Shoulder  --   post Tx: no compensation at upper T spine     Palpation   Spinal mobility  hypomobile segment T4-6, T10 with flinching pain ( post Tx: improved mobility, but flinching pain present)     Palpation comment  increased tightness serratus anterior L > R, posterior ribs T10-12, interspinals R > L .                    [facility] Adult PT Treatment/Exercise - 03/09/19 1051      Neuro Re-ed    Neuro Re-ed Details   cued for segmental movement of spine in cat/cow on all fours, cued for timing and technique,       Moist Heat Therapy   Number Minutes Moist Heat  5 Minutes    Moist Heat Location  --   neck/ thoracic during guided body scan      Manual Therapy   Manual therapy comments  distraction, jostle, MWM, Grade III T3-4. T10-12, STM MWM increased tightness serratus  anterior L > R, posterior ribs,  interspinals R > L .                  PT Long Term Goals - 03/02/19 1308      PT LONG TERM GOAL #1   Title  Pt will increase his score on LEFS from 57 pt to > 67pt in order to demo decreased pain with R knee for QOL ( 10/20: 68 pts)    Time  8    Period  Weeks    Status  Achieved      PT LONG TERM GOAL #2   Title  Pt will report decreased urinary frequency from 2x  every 2 hours to < 1 x every 2 hours in order to participate in community activities    Time  4    Period  Weeks    Status  Achieved      PT LONG TERM GOAL #3   Title  Pt will report decreased burning sensation with urination across 75% of the time in order to perform ADLs    Time  6    Period  Weeks    Status  Achieved      PT LONG TERM GOAL #4   Title  Pt will demo levelled iliac crest in standing and supine in order to progress to deep core strengthening exercises    Time  2    Period  Weeks    Status  Achieved      PT LONG TERM GOAL #5   Title  Pt will demo decreased lumbar mm tightness and increased shoulder abduction from 120 deg to 180 deg in order to have the mobility to minimize Sx    Time  2    Period  Weeks    Status  Achieved      PT LONG TERM GOAL #6   Title  Pt will be IND with flexibility routine and strengthening routine to have a well-rounded routine to minimize injuries/ R knee pain with repeated biking.    Time  10    Period  Weeks    Status  On-going      PT LONG TERM GOAL #7   Title  Pt will decrease score on NIH-CPSI from 53% to < 23% in order to restore urinary function  ( 02/09/19: 37%)    Time  10    Period  Weeks    Status  Partially Met            Plan - 03/09/19 1057    Clinical Impression Statement  Pt continues to progress well with manual Tx to decrease upper quadrant mm tightness and increase cervical AROM. Pt showed significantly increased cervical sideflexion/ rotation following Tx. Pt also showed no compensations with  overhead reaching which will help with lifting weights with less risk for shoulder injuries. Pt also showed increased ability to minimize overuse of upper trap and achieve full relaxation of body which will help with minimizing overuse of upper trap with fitness exercises and further help with relaxation of pelvic floor mm for urgency issues. Pt continues to benefit from skilled PT.   Personal Factors and Comorbidities  Fitness    Examination-Activity Limitations  Toileting    Stability/Clinical Decision Making  Evolving/Moderate complexity    Rehab Potential  Good    PT Frequency  1x / week    PT Duration  --   10   PT Treatment/Interventions  Balance training;Neuromuscular re-education;Manual techniques;Gait training;Moist Heat;Therapeutic activities;Therapeutic exercise;Taping;Stair training;Scar mobilization;Patient/family education    Consulted and Agree with Plan of Care  Patient       Patient will benefit from skilled therapeutic intervention in order to improve the following deficits and impairments:  Decreased endurance, Decreased strength, Decreased mobility, Decreased scar mobility, Increased muscle spasms, Improper body mechanics, Increased fascial restricitons, Decreased coordination, Decreased safety awareness, Decreased activity tolerance, Postural dysfunction, Decreased range of motion, Hypomobility, Pain  Visit Diagnosis: Other muscle spasm  Chronic pain of right knee  Sacrococcygeal disorders, not elsewhere classified     Problem List Patient Active Problem List   Diagnosis Date Noted  . Interstitial cystitis 03/04/2019  . Obstructive sleep apnea 12/29/2018  . Hepatic steatosis 04/05/2018  . Hiatal hernia 09/09/2017  . Hx of adenomatous colonic polyps 07/15/2017  . Intermittent left lower quadrant abdominal pain 07/15/2017  . Allergic rhinitis, seasonal 06/14/2014  . Chronic neck pain 06/14/2014  . Gastroesophageal reflux disease without esophagitis 06/14/2014     Jerl Mina ,PT, DPT, E-RYT  03/09/2019, 11:09 AM  [place] MAIN [facility] SERVICES [address] [place], [place], [address] Phone: [phone]   Fax:  [phone]  Name: Coleton Woon MRN: [id number] Date of Birth: Apr 30, 1946

## 2019-03-16 ENCOUNTER — Other Ambulatory Visit: Payer: Self-pay

## 2019-03-16 ENCOUNTER — Ambulatory Visit: Payer: Medicare HMO | Attending: Urology | Admitting: Physical Therapy

## 2019-03-16 DIAGNOSIS — R2689 Other abnormalities of gait and mobility: Secondary | ICD-10-CM | POA: Diagnosis not present

## 2019-03-16 DIAGNOSIS — M25561 Pain in right knee: Secondary | ICD-10-CM | POA: Insufficient documentation

## 2019-03-16 DIAGNOSIS — M533 Sacrococcygeal disorders, not elsewhere classified: Secondary | ICD-10-CM | POA: Insufficient documentation

## 2019-03-16 DIAGNOSIS — M62838 Other muscle spasm: Secondary | ICD-10-CM | POA: Diagnosis not present

## 2019-03-16 DIAGNOSIS — G8929 Other chronic pain: Secondary | ICD-10-CM | POA: Diagnosis present

## 2019-03-16 NOTE — Patient Instructions (Addendum)
Multifidis twist  Band is on doorknob: stand further away from door (facing perpendicular)   Twisting trunk without moving the hips and knees Hold band at the level of ribcage, elbows bent,shoulder blades roll back and down like squeezing a pencil under armpit    Exhale twist,.10-15 deg away from door without moving your hips/ knees. Continue to maintain equal weight through legs. Keep knee unlocked.  10 x 2     ___   Stretches during the day At a counter 3 x day   To lengthen spine   _childs pose ( mini squat)  _to the R and to the L  _thread the needle ( R hand on L thigh) keep hips squared and pressing legs into the ground     Switch sides  _elbow to sky    ___  Discontinue wearing shoe lift

## 2019-03-17 NOTE — Therapy (Addendum)
[place] MAIN [facility] SERVICES [address] [place], [place], [address] Phone: [phone]   Fax:  [phone]  Physical Therapy Treatment / PROGRESS NOTE BETWEEN 12/29/18 to 03/16/19   Patient Details  Name: Donald Barry MRN: [id number] Date of Birth: [birth year] Referring Provider (PT): Sninisky    Encounter Date: 03/16/2019  PT End of Session - 03/17/19 2130    Visit Number  10    Number of Visits  10    Date for PT Re-Evaluation  05/11/19    PT Start Time  1400    PT Stop Time  1500    PT Time Calculation (min)  60 min    Activity Tolerance  Patient tolerated treatment well       Past Medical History:  Diagnosis Date  . Allergic rhinitis, seasonal 06/14/2014  . Allergy   . Arthritis   . Chronic neck pain 06/14/2014  . Complication of anesthesia   . Gastroesophageal reflux disease without esophagitis 06/14/2014  . Hepatic steatosis 04/05/2018  . Hiatal hernia 09/09/2017  . Intermittent left lower quadrant abdominal pain 07/15/2017  . PONV (postoperative nausea and vomiting)   . 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    Past Surgical History:  Procedure Laterality Date  . BACK SURGERY  2008   L4, L5  . COLONOSCOPY    . CYSTOSCOPY WITH BIOPSY N/A 07/17/2018   Procedure: CYSTOSCOPY WITH BLADDER BIOPSY;  Surgeon: Billey Co, MD;  Location: [facility] ORS;  Service: Urology;  Laterality: N/A;  . CYSTOSCOPY WITH FULGERATION N/A 07/17/2018   Procedure: CYSTOSCOPY WITH FULGERATION;  Surgeon: Billey Co, MD;  Location: [facility] ORS;  Service: Urology;  Laterality: N/A;  . ESOPHAGOGASTRODUODENOSCOPY    . HERNIA REPAIR  [id number]   umbilical  . STOMACH SURGERY     FUNDIC GLAND POLYP    There were no vitals filed for this visit.  Subjective Assessment - 03/16/19 1407    Subjective  Pt reported his neck still hurt when doing the exercises where he is moving the neck in all 6 directions.  The urge was better last week but it got  to where he started from after eating pumpkin chocolate cookies. Pt thinks it is the chocolate and he has tried to cut back on the chocolate. Pt used to wake up with L side discomfort when lying on his L side in the morning for the past 3 years. Pt has not had this  had this discomfort for the past 1.5 -2 months.    Pertinent History  Hx umbilical hernia, G5-4 lumbar surgery, Denied falls , falls onto tailbone. R knee with OA with shots. 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 Hobbies: biking every other day 11 miles without  flexibilty routine.  Pt's bike seat is cushiony and the contact points are at the sitting bones. Bike has hydraulic system as shock absorption.  Pt has  performed sit ups/ crunches when in the [employer] [place], gardening, yard work.    Patient Stated Goals  eliminate urgency and burning snsation         [facility] PT Assessment - 03/17/19 2119      Coordination   Fine Motor Movements are Fluid and Coordinated  --   improved scapular depression without cues     Palpation   Palpation comment  increased tightenss SA/ lower trap B      Ambulation/Gait  Gait Comments  R head turn less than L, limited pelvic rotation anteriorly L, and R shoulder anteriorly , excessive L sway,  ( post Tx: no shoe lift and after manual Tx: more reciporcal rotation of pelvis and shoulder, more R head turn )                     [facility] Adult PT Treatment/Exercise - 03/17/19 2119      Modalities   Modalities  Moist Heat      Moist Heat Therapy   Moist Heat Location  --   10 thoracic      Manual Tx: STM/MWM at low trap/ SA, fascial release over lumbar scar   There Ex: stretches see pt instructions to promote spinal mobility at all parts of spine There Act: reassessed goals           PT Long Term Goals - 03/16/19 1308      PT LONG TERM GOAL #1   Title  Pt will increase his score on LEFS  from 57 pt to > 67pt in order to demo decreased pain with R knee for QOL ( 10/20: 68 pts)    Time  8    Period  Weeks    Status  Achieved      PT LONG TERM GOAL #2   Title  Pt will report decreased urinary frequency from 2x  every 2 hours to < 1 x every 2 hours in order to participate in community activities    Time  4    Period  Weeks    Status  Achieved      PT LONG TERM GOAL #3   Title  Pt will report decreased burning sensation with urination across 75% of the time in order to perform ADLs    Time  6    Period  Weeks    Status  Achieved      PT LONG TERM GOAL #4   Title  Pt will demo levelled iliac crest in standing and supine in order to progress to deep core strengthening exercises    Time  2    Period  Weeks    Status  Achieved      PT LONG TERM GOAL #5   Title  Pt will demo decreased lumbar mm tightness and increased shoulder abduction from 120 deg to 180 deg in order to have the mobility to minimize Sx    Time  2    Period  Weeks    Status  Achieved      PT LONG TERM GOAL #6   Title  Pt will be IND with flexibility routine and strengthening routine to have a well-rounded routine to minimize injuries/ R knee pain with repeated biking.    Time  10    Period  Weeks    Status  On-going      PT LONG TERM GOAL #7   Title  Pt will decrease score on NIH-CPSI from 53% to < 23% in order to restore urinary function  ( 02/09/19: 37%)    Time  10    Period  Weeks    Status  Partially Met            Plan - 03/16/19 1446    Clinical Impression Statement Across the past 10 visits, pt has achieved 5/7 goals and progressing well towards remaining goals. His  Improvements include:   _ decreased urinary frequency  < 1 x every 2 hours  and was able to drive his family in RV from [place] to [place] and play with grandchildren _decreased burning sensation with urination across 75% of the time in order to perform ADLs  _increased LEFS score indicating no more R knee pain _ Pt used to wake  up with L side discomfort when lying on his L side in the morning for the past 3 years. Pt has not had this had this discomfort for the past 1.5 -2 months.   Pt's MSK improvements include equally aligned pelvic alignment, more reciprocal gait pattern, more equal shoulder height, less imbalance of spinal mm tightness, pelvic floor tightness, and increased upright posture. These improvements have helped pt achieve his goals. Remaining focus is on helping pt integrate into fitness to minimize relapse of Sx. Pt continues to benefit from skilled PT.     Personal Factors and Comorbidities  Fitness    Examination-Activity Limitations  Toileting    Stability/Clinical Decision Making  Evolving/Moderate complexity    Rehab Potential  Good    PT Frequency  1x / week    PT Duration  --   10   PT Treatment/Interventions  Balance training;Neuromuscular re-education;Manual techniques;Gait training;Moist Heat;Therapeutic activities;Therapeutic exercise;Taping;Stair training;Scar mobilization;Patient/family education    Consulted and Agree with Plan of Care  Patient       Patient will benefit from skilled therapeutic intervention in order to improve the following deficits and impairments:  Decreased endurance, Decreased strength, Decreased mobility, Decreased scar mobility, Increased muscle spasms, Improper body mechanics, Increased fascial restricitons, Decreased coordination, Decreased safety awareness, Decreased activity tolerance, Postural dysfunction, Decreased range of motion, Hypomobility, Pain  Visit Diagnosis: Other muscle spasm  Chronic pain of right knee  Sacrococcygeal disorders, not elsewhere classified     Problem List Patient Active Problem List   Diagnosis Date Noted  . Interstitial cystitis 03/04/2019  . Obstructive sleep apnea 12/29/2018  . Hepatic steatosis 04/05/2018  . Hiatal hernia 09/09/2017  . Hx of adenomatous colonic polyps 07/15/2017  . Intermittent left lower quadrant  abdominal pain 07/15/2017  . Allergic rhinitis, seasonal 06/14/2014  . Chronic neck pain 06/14/2014  . Gastroesophageal reflux disease without esophagitis 06/14/2014    Jerl Mina ,PT, DPT, E-RYT  03/17/2019, 9:34 PM  [place] MAIN [facility] SERVICES [address] [place], [place], [address] Phone: [phone]   Fax:  [phone]  Name: Donald Barry MRN: [id number] Date of Birth: 1946-03-18

## 2019-03-23 ENCOUNTER — Other Ambulatory Visit: Payer: Self-pay

## 2019-03-23 ENCOUNTER — Ambulatory Visit: Payer: Medicare HMO | Admitting: Physical Therapy

## 2019-03-23 DIAGNOSIS — M533 Sacrococcygeal disorders, not elsewhere classified: Secondary | ICD-10-CM

## 2019-03-23 DIAGNOSIS — M62838 Other muscle spasm: Secondary | ICD-10-CM | POA: Diagnosis not present

## 2019-03-23 DIAGNOSIS — G8929 Other chronic pain: Secondary | ICD-10-CM

## 2019-03-23 NOTE — Patient Instructions (Addendum)
Elbow circles    Self-hug and arms overhead and angel wing down ( all one motion)  Taking off sweater motion

## 2019-03-23 NOTE — Therapy (Signed)
[place] MAIN [facility] SERVICES [address] [place], [place], [address] Phone: [phone]   Fax:  [phone]  Physical Therapy Treatment  Patient Details  Name: Donald Barry MRN: [id number] Date of Birth: Feb 06, 1946 Referring Provider (PT): Sninisky    Encounter Date: 03/23/2019  PT End of Session - 03/23/19 1529    Visit Number  11    Date for PT Re-Evaluation  05/11/19    Authorization - Visit Number  --   last progress note 03/16/19   PT Start Time  1430    PT Stop Time  1530    PT Time Calculation (min)  60 min    Activity Tolerance  Patient tolerated treatment well       Past Medical History:  Diagnosis Date  . Allergic rhinitis, seasonal 06/14/2014  . Allergy   . Arthritis   . Chronic neck pain 06/14/2014  . Complication of anesthesia   . Gastroesophageal reflux disease without esophagitis 06/14/2014  . Hepatic steatosis 04/05/2018  . Hiatal hernia 09/09/2017  . Intermittent left lower quadrant abdominal pain 07/15/2017  . PONV (postoperative nausea and vomiting)   . 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    Past Surgical History:  Procedure Laterality Date  . BACK SURGERY  2008   L4, L5  . COLONOSCOPY    . CYSTOSCOPY WITH BIOPSY N/A 07/17/2018   Procedure: CYSTOSCOPY WITH BLADDER BIOPSY;  Surgeon: Billey Co, MD;  Location: [facility] ORS;  Service: Urology;  Laterality: N/A;  . CYSTOSCOPY WITH FULGERATION N/A 07/17/2018   Procedure: CYSTOSCOPY WITH FULGERATION;  Surgeon: Billey Co, MD;  Location: [facility] ORS;  Service: Urology;  Laterality: N/A;  . ESOPHAGOGASTRODUODENOSCOPY    . HERNIA REPAIR  [id number]   umbilical  . STOMACH SURGERY     FUNDIC GLAND POLYP    There were no vitals filed for this visit.  Subjective Assessment - 03/23/19 1433    Subjective  Pt reports less pain with his neck when doing his exercises after last session. Pt felt pain in his low back after last session later that night  3-4/10. It was the type of pain like before his surgery. It has digressed over the past days to 1-2/10. Pt was able to contiue with the exercise.  Pt used the greenband doubled.    Pertinent History  Hx umbilical hernia, C5-8 lumbar surgery, Denied falls , falls onto tailbone. R knee with OA with shots. 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 Hobbies: biking every other day 11 miles without  flexibilty routine.  Pt's bike seat is cushiony and the contact points are at the sitting bones. Bike has hydraulic system as shock absorption.  Pt has  performed sit ups/ crunches when in the [employer] [place], gardening, yard work.    Patient Stated Goals  eliminate urgency and burning snsation         [facility] PT Assessment - 03/23/19 1528      Observation/Other Assessments   Observations  multitidifs twist performed correctly but modified to seated position to minimize pain and soreness       PROM   Overall PROM Comments  shoulder ext -~10 deg B, post Tx, shoudler ext ~10       Palpation   Spinal mobility  no  fascial restrictions     Palpation comment  B lat , teres major/ minor tightness, L > R tightness  [place] Adult PT Treatment/Exercise - 03/23/19 1526      Therapeutic Activites    Other Therapeutic Activities  reassessed HEP with multifidis       Exercises   Exercises  --   elbow circles and scap rotation      Modalities   Modalities  Moist Heat      Moist Heat Therapy   Number Minutes Moist Heat  8 Minutes    Moist Heat Location  --   shoulder/ axilla      Manual Therapy   Manual therapy comments  B lat , teres major/ minor  STM/ MWM to promote more GH joint mobility for more shoudler extension, scap downward rotation                     PT Long Term Goals - 03/23/19 1531      PT LONG TERM GOAL #1   Title  Pt will increase his score on LEFS from 57 pt to >  67pt in order to demo decreased pain with R knee for QOL ( 10/20: 68 pts)    Time  8    Period  Weeks    Status  Achieved      PT LONG TERM GOAL #2   Title  Pt will report decreased urinary frequency from 2x  every 2 hours to < 1 x every 2 hours in order to participate in community activities    Time  4    Period  Weeks    Status  Achieved      PT LONG TERM GOAL #3   Title  Pt will report decreased burning sensation with urination across 75% of the time in order to perform ADLs    Time  6    Period  Weeks    Status  Achieved      PT LONG TERM GOAL #4   Title  Pt will demo levelled iliac crest in standing and supine in order to progress to deep core strengthening exercises    Time  2    Period  Weeks    Status  Achieved      PT LONG TERM GOAL #5   Title  Pt will demo decreased lumbar mm tightness and increased shoulder abduction from 120 deg to 180 deg in order to have the mobility to minimize Sx    Time  2    Period  Weeks    Status  Achieved      PT LONG TERM GOAL #6   Title  Pt will be IND with flexibility routine and strengthening routine to have a well-rounded routine to minimize injuries/ R knee pain with repeated biking.    Time  10    Period  Weeks    Status  On-going      PT LONG TERM GOAL #7   Title  Pt will decrease score on NIH-CPSI from 53% to < 23% in order to restore urinary function  ( 02/09/19: 37%)    Time  10    Period  Weeks    Status  Partially Met            Plan - 03/23/19 1530    Clinical Impression Statement  Pt required further manual Tx to achieve shoulder extension and better GH joint mobility to minimize risk for injuries. This achievement today will help pt minimize neck pain with HEP and to be ready to progress to upper body band training/ strengthening at  upcoming visits. Pt reports urge is not as strong which will continue to improve as his posture improves. Pt continues to benefit from skilled PT.    Personal Factors and Comorbidities   Fitness    Examination-Activity Limitations  Toileting    Stability/Clinical Decision Making  Evolving/Moderate complexity    Rehab Potential  Good    PT Frequency  1x / week    PT Duration  --   10   PT Treatment/Interventions  Balance training;Neuromuscular re-education;Manual techniques;Gait training;Moist Heat;Therapeutic activities;Therapeutic exercise;Taping;Stair training;Scar mobilization;Patient/family education    Consulted and Agree with Plan of Care  Patient       Patient will benefit from skilled therapeutic intervention in order to improve the following deficits and impairments:  Decreased endurance, Decreased strength, Decreased mobility, Decreased scar mobility, Increased muscle spasms, Improper body mechanics, Increased fascial restricitons, Decreased coordination, Decreased safety awareness, Decreased activity tolerance, Postural dysfunction, Decreased range of motion, Hypomobility, Pain  Visit Diagnosis: No diagnosis found.     Problem List Patient Active Problem List   Diagnosis Date Noted  . Interstitial cystitis 03/04/2019  . Obstructive sleep apnea 12/29/2018  . Hepatic steatosis 04/05/2018  . Hiatal hernia 09/09/2017  . Hx of adenomatous colonic polyps 07/15/2017  . Intermittent left lower quadrant abdominal pain 07/15/2017  . Allergic rhinitis, seasonal 06/14/2014  . Chronic neck pain 06/14/2014  . Gastroesophageal reflux disease without esophagitis 06/14/2014    Jerl Mina ,PT, DPT, E-RYT  03/23/2019, 3:32 PM  [place] MAIN [facility] SERVICES [address] [place], [place], [address] Phone: [phone]   Fax:  [phone]  Name: Donald Barry MRN: [id number] Date of Birth: 1945-07-25

## 2019-03-30 ENCOUNTER — Ambulatory Visit: Payer: Medicare HMO | Admitting: Physical Therapy

## 2019-03-30 ENCOUNTER — Other Ambulatory Visit: Payer: Self-pay

## 2019-03-30 DIAGNOSIS — G8929 Other chronic pain: Secondary | ICD-10-CM

## 2019-03-30 DIAGNOSIS — M62838 Other muscle spasm: Secondary | ICD-10-CM | POA: Diagnosis not present

## 2019-03-30 DIAGNOSIS — M25561 Pain in right knee: Secondary | ICD-10-CM

## 2019-03-30 DIAGNOSIS — M533 Sacrococcygeal disorders, not elsewhere classified: Secondary | ICD-10-CM

## 2019-03-30 NOTE — Patient Instructions (Signed)
Hand on wall,  single leg heel raises,   spread toes  Look straight ahead 15 reps each  Toe stretch ( against wall in lunge position Calf stretches  ( in lunge position - bend back knee10 reps)

## 2019-03-30 NOTE — Therapy (Signed)
[place] MAIN [facility] SERVICES [address] [place], [place], [address] Phone: [phone]   Fax:  [phone]  Physical Therapy Treatment  Patient Details  Name: Donald Barry MRN: [id number] Date of Birth: 02-10-1946 Referring Provider (PT): Sninisky    Encounter Date: 03/30/2019  PT End of Session - 03/30/19 0951    Visit Number  12    Date for PT Re-Evaluation  05/11/19    Authorization - Visit Number  --   last progress note 03/16/19   PT Start Time  1829    PT Stop Time  0951    PT Time Calculation (min)  47 min    Activity Tolerance  Patient tolerated treatment well       Past Medical History:  Diagnosis Date  . Allergic rhinitis, seasonal 06/14/2014  . Allergy   . Arthritis   . Chronic neck pain 06/14/2014  . Complication of anesthesia   . Gastroesophageal reflux disease without esophagitis 06/14/2014  . Hepatic steatosis 04/05/2018  . Hiatal hernia 09/09/2017  . Intermittent left lower quadrant abdominal pain 07/15/2017  . PONV (postoperative nausea and vomiting)   . 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    Past Surgical History:  Procedure Laterality Date  . BACK SURGERY  2008   L4, L5  . COLONOSCOPY    . CYSTOSCOPY WITH BIOPSY N/A 07/17/2018   Procedure: CYSTOSCOPY WITH BLADDER BIOPSY;  Surgeon: Billey Co, MD;  Location: [facility] ORS;  Service: Urology;  Laterality: N/A;  . CYSTOSCOPY WITH FULGERATION N/A 07/17/2018   Procedure: CYSTOSCOPY WITH FULGERATION;  Surgeon: Billey Co, MD;  Location: [facility] ORS;  Service: Urology;  Laterality: N/A;  . ESOPHAGOGASTRODUODENOSCOPY    . HERNIA REPAIR  [id number]   umbilical  . STOMACH SURGERY     FUNDIC GLAND POLYP    There were no vitals filed for this visit.  Subjective Assessment - 03/30/19 0911    Subjective  "Everything seems to be pretty good ".  Back felt a little sore after last session    Pertinent History  Hx umbilical hernia, I9-6 lumbar surgery,  Denied falls , falls onto tailbone. R knee with OA with shots. 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 Hobbies: biking every other day 11 miles without  flexibilty routine.  Pt's bike seat is cushiony and the contact points are at the sitting bones. Bike has hydraulic system as shock absorption.  Pt has  performed sit ups/ crunches when in the [employer] [place], gardening, yard work.    Patient Stated Goals  eliminate urgency and burning snsation         [facility] PT Assessment - 03/30/19 0941      Observation/Other Assessments   Observations  medial collapse of arches       AROM   Overall AROM Comments  GH ROM B restored in supine, shoulder abduction B, with slight scaption ( limited lats to assess next session).  Cervical rotation B restored [facility]        Strength   Overall Strength Comments  no UE support, MMT 4/5 B 2 reps ( pre Tx), (preTx) 2/5  R 5reps L  4 reps)       Palpation   Palpation comment   adductror hallucis/ DAB/ PAD/ lumbricals B   hypomobie at cuboid/ medial cuneiform L      Ambulation/Gait   Gait Comments  signficantly improved  pelvic oration B with more reciporal trunk rotation, slight L toe in,                    [place] Adult PT Treatment/Exercise - 03/30/19 0940      Neuro Re-ed    Neuro Re-ed Details   cued for more toe abduction, DF., mobility in 3 parts of foot in gait       Manual Therapy   Manual therapy comments  B distraction at talocrural joints, AP/PA along rays to promote balance between supination/ pronation, STM at transverse head of hallus adductor mm, along pereoneal longus, AP/ superior mob at fibula head to promote DF/EV                   PT Long Term Goals - 03/23/19 1531      PT LONG TERM GOAL #1   Title  Pt will increase his score on LEFS from 57 pt to > 67pt in order to demo decreased pain with R knee for QOL ( 10/20: 68 pts)    Time  8     Period  Weeks    Status  Achieved      PT LONG TERM GOAL #2   Title  Pt will report decreased urinary frequency from 2x  every 2 hours to < 1 x every 2 hours in order to participate in community activities    Time  4    Period  Weeks    Status  Achieved      PT LONG TERM GOAL #3   Title  Pt will report decreased burning sensation with urination across 75% of the time in order to perform ADLs    Time  6    Period  Weeks    Status  Achieved      PT LONG TERM GOAL #4   Title  Pt will demo levelled iliac crest in standing and supine in order to progress to deep core strengthening exercises    Time  2    Period  Weeks    Status  Achieved      PT LONG TERM GOAL #5   Title  Pt will demo decreased lumbar mm tightness and increased shoulder abduction from 120 deg to 180 deg in order to have the mobility to minimize Sx    Time  2    Period  Weeks    Status  Achieved      PT LONG TERM GOAL #6   Title  Pt will be IND with flexibility routine and strengthening routine to have a well-rounded routine to minimize injuries/ R knee pain with repeated biking.    Time  10    Period  Weeks    Status  On-going      PT LONG TERM GOAL #7   Title  Pt will decrease score on NIH-CPSI from 53% to < 23% in order to restore urinary function  ( 02/09/19: 37%)    Time  10    Period  Weeks    Status  Partially Met            Plan - 03/30/19 1005    Clinical Impression Statement  Pt is progressing well with signficantly improved pelvic roation B with more reciporal trunk rotation, GH joint AROM, improved scapular mobility. Pt's remaining deficits that need to be addressed before introducing pt to use of resistance band strengthening and other fitness exercises include: slight L toe in, medial collapse of  B arches,  limited DF AROM, PF weakness and balance difficulty in SLS. Following manual Tx, pt demo'd increased feet mobility and increased ability to perform PF exercises. Pt continues to benefit from  skilled PT to achieve remaining goals.   Personal Factors and Comorbidities  Fitness    Examination-Activity Limitations  Toileting    Stability/Clinical Decision Making  Evolving/Moderate complexity    Rehab Potential  Good    PT Frequency  1x / week    PT Duration  --   10   PT Treatment/Interventions  Balance training;Neuromuscular re-education;Manual techniques;Gait training;Moist Heat;Therapeutic activities;Therapeutic exercise;Taping;Stair training;Scar mobilization;Patient/family education    Consulted and Agree with Plan of Care  Patient       Patient will benefit from skilled therapeutic intervention in order to improve the following deficits and impairments:  Decreased endurance, Decreased strength, Decreased mobility, Decreased scar mobility, Increased muscle spasms, Improper body mechanics, Increased fascial restricitons, Decreased coordination, Decreased safety awareness, Decreased activity tolerance, Postural dysfunction, Decreased range of motion, Hypomobility, Pain  Visit Diagnosis: Other muscle spasm  Chronic pain of right knee  Sacrococcygeal disorders, not elsewhere classified     Problem List Patient Active Problem List   Diagnosis Date Noted  . Interstitial cystitis 03/04/2019  . Obstructive sleep apnea 12/29/2018  . Hepatic steatosis 04/05/2018  . Hiatal hernia 09/09/2017  . Hx of adenomatous colonic polyps 07/15/2017  . Intermittent left lower quadrant abdominal pain 07/15/2017  . Allergic rhinitis, seasonal 06/14/2014  . Chronic neck pain 06/14/2014  . Gastroesophageal reflux disease without esophagitis 06/14/2014    Jerl Mina ,PT, DPT, E-RYT  03/30/2019, 10:31 AM  [place] MAIN [facility] SERVICES [address] [place], [place], [address] Phone: [phone]   Fax:  [phone]  Name: Donald Barry MRN: [id number] Date of Birth: Mar 29, 1946

## 2019-04-05 ENCOUNTER — Inpatient Hospital Stay: Payer: Medicare HMO

## 2019-04-12 ENCOUNTER — Other Ambulatory Visit: Payer: Self-pay | Admitting: Urology

## 2019-04-12 ENCOUNTER — Ambulatory Visit: Payer: Medicare HMO | Admitting: Oncology

## 2019-04-13 ENCOUNTER — Other Ambulatory Visit: Payer: Self-pay

## 2019-04-13 ENCOUNTER — Ambulatory Visit: Payer: Medicare HMO | Attending: Urology | Admitting: Physical Therapy

## 2019-04-13 DIAGNOSIS — M62838 Other muscle spasm: Secondary | ICD-10-CM | POA: Insufficient documentation

## 2019-04-13 DIAGNOSIS — M25561 Pain in right knee: Secondary | ICD-10-CM | POA: Insufficient documentation

## 2019-04-13 DIAGNOSIS — G8929 Other chronic pain: Secondary | ICD-10-CM | POA: Insufficient documentation

## 2019-04-13 DIAGNOSIS — M533 Sacrococcygeal disorders, not elsewhere classified: Secondary | ICD-10-CM | POA: Diagnosis present

## 2019-04-13 NOTE — Patient Instructions (Addendum)
Static single leg standing on one foot,  no toe gripping  spread toes, press through ballmounds  Focus at a point ahead of you Work up to 30 sec without hand on wall   ___  Step ups on R, step down on the R, lower heel slow to build up plantar fascia  20 reps  No hand hold on your step    ____  PRogress clams to this exercise:   blue band under door  _ tap back at diagonal   30 reps  Both sides    ____ Add hip extension to strengthening R gluts  blue band under door  _ tap back straight behind with R leg only  30 reps   ____  Weave a cloth between toes to spread them for the above exercises

## 2019-04-13 NOTE — Therapy (Signed)
[name] [name] SERVICES [address] [place], [place], [address] Phone: [phone]   Fax:  [phone]  Physical Therapy Treatment  Patient Details  Name: Donald Barry MRN: [id number] Date of Birth: 02/09/1946 Referring Provider (PT): Sninisky    Encounter Date: 04/13/2019  PT End of Session - 04/13/19 1414    Visit Number  13    Date for PT Re-Evaluation  05/11/19    Authorization - Visit Number  --   last progress note 03/16/19   PT Start Time  1410    PT Stop Time  1500    PT Time Calculation (min)  50 min    Activity Tolerance  Patient tolerated treatment well       Past Medical History:  Diagnosis Date  . Allergic rhinitis, seasonal 06/14/2014  . Allergy   . Arthritis   . Chronic neck pain 06/14/2014  . Complication of anesthesia   . Gastroesophageal reflux disease without esophagitis 06/14/2014  . Hepatic steatosis 04/05/2018  . Hiatal hernia 09/09/2017  . Intermittent left lower quadrant abdominal pain 07/15/2017  . PONV (postoperative nausea and vomiting)   . 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    Past Surgical History:  Procedure Laterality Date  . BACK SURGERY  2008   L4, L5  . COLONOSCOPY    . CYSTOSCOPY WITH BIOPSY N/A 07/17/2018   Procedure: CYSTOSCOPY WITH BLADDER BIOPSY;  Surgeon: Billey Co, MD;  Location: [facility] ORS;  Service: Urology;  Laterality: N/A;  . CYSTOSCOPY WITH FULGERATION N/A 07/17/2018   Procedure: CYSTOSCOPY WITH FULGERATION;  Surgeon: Billey Co, MD;  Location: [facility] ORS;  Service: Urology;  Laterality: N/A;  . ESOPHAGOGASTRODUODENOSCOPY    . HERNIA REPAIR  [phone]   umbilical  . STOMACH SURGERY     FUNDIC GLAND POLYP    There were no vitals filed for this visit.  Subjective Assessment - 04/13/19 1412    Subjective  Pt was sick with dizziness and threw up 2 weeks ago. Pt took medication which helped. Pt did not have COVID like Sx. neck pain is still the same at  4-5/10 when lying on his back and doing the 6 way neck stretch. Since not wearing the shoe lift for the past 2 weeks, pt has no issues at the R knee.    Pertinent History  Hx umbilical hernia, [id number] lumbar surgery, Denied falls , falls onto tailbone. R knee with OA with shots. 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 Hobbies: biking every other day 11 miles without  flexibilty routine.  Pt's bike seat is cushiony and the contact points are at the sitting bones. Bike has hydraulic system as shock absorption.  Pt has  performed sit ups/ crunches when in the [employer] [place], gardening, yard work.    Patient Stated Goals  eliminate urgency and burning snsation         [facility] PT Assessment - 04/13/19 1424      Observation/Other Assessments   Observations  bridging exercise caused cramping in hamstring and therefore, deferred to standing hip ext/ abd strengthening       Coordination   Gross Motor Movements are Fluid and Coordinated  --   no more perturbation with posterior sling test     Single Leg Stance   Comments    Without UE support SLS: L 22 sec,  R 16 sec, toe gripping  Strength   Overall Strength  --   hip abd L 4-/5, R 4+/5, hip ext R 3+/5    Overall Strength Comments  with UE support 27 reps on R, 16 reps .                     [place] Adult PT Treatment/Exercise - 04/13/19 1448      Neuro Re-ed    Neuro Re-ed Details   cued for toe spread, ballmound co-activation in SLS and step up/ down.         Exercises   Exercises  --   see new HEP for hip ext/ abd strengthening      Manual Therapy   Manual therapy comments  STM to decrease hamstring cramp after bridging exercise                  PT Long Term Goals - 04/13/19 1415      PT LONG TERM GOAL #1   Title  Pt will increase his score on LEFS from 57 pt to > 67pt in order to demo decreased pain with R knee for QOL (  10/20: 68 pts)    Time  8    Period  Weeks    Status  Achieved      PT LONG TERM GOAL #2   Title  Pt will report decreased urinary frequency from 2x  every 2 hours to < 1 x every 2 hours in order to participate in community activities    Time  4    Period  Weeks    Status  Achieved      PT LONG TERM GOAL #3   Title  Pt will report decreased burning sensation with urination across 75% of the time in order to perform ADLs    Time  6    Period  Weeks    Status  Achieved      PT LONG TERM GOAL #4   Title  Pt will demo levelled iliac crest in standing and supine in order to progress to deep core strengthening exercises    Time  2    Period  Weeks    Status  Achieved      PT LONG TERM GOAL #5   Title  Pt will demo decreased lumbar mm tightness and increased shoulder abduction from 120 deg to 180 deg in order to have the mobility to minimize Sx    Time  2    Period  Weeks    Status  Achieved      Additional Long Term Goals   Additional Long Term Goals  Yes      PT LONG TERM GOAL #6   Title  Pt will be IND with flexibility routine and strengthening routine to have a well-rounded routine to minimize injuries/ R knee pain with repeated biking.    Time  10    Period  Weeks    Status  On-going      PT LONG TERM GOAL #7   Title  Pt will decrease score on NIH-CPSI from 53% to < 23% in order to restore urinary function  ( 02/09/19: 37%, 12/1: 23% )    Time  10    Period  Weeks    Status  Achieved      PT LONG TERM GOAL #8   Title  Pt will demo increased SLS without UE support from 22 sec on L, 16 sec on R to > 30  sec on BLE in order to improve balance, feet core to minimize fall risks    Time  4    Period  Weeks    Status  New    Target Date  05/11/19            Plan - 04/13/19 1449    Clinical Impression Statement  Pt demo'd improved arches and multifidis strength. Progressed to further feet core strengthening with step up and down. Pt requried excessive cues for toe  abduction and less toe gripping. Bridges were removed from today's HEP and deferred to standing hip extension with resistance band due to /o hamstring cramping which decreased post manual Tx. Pt continues to benefit from skilled PT to achieve goals for increased balance and strength along lower kinetic chain.     Personal Factors and Comorbidities  Fitness    Examination-Activity Limitations  Toileting    Stability/Clinical Decision Making  Evolving/Moderate complexity    Rehab Potential  Good    PT Frequency  1x / week    PT Duration  --   10   PT Treatment/Interventions  Balance training;Neuromuscular re-education;Manual techniques;Gait training;Moist Heat;Therapeutic activities;Therapeutic exercise;Taping;Stair training;Scar mobilization;Patient/family education    Consulted and Agree with Plan of Care  Patient       Patient will benefit from skilled therapeutic intervention in order to improve the following deficits and impairments:  Decreased endurance, Decreased strength, Decreased mobility, Decreased scar mobility, Increased muscle spasms, Improper body mechanics, Increased fascial restricitons, Decreased coordination, Decreased safety awareness, Decreased activity tolerance, Postural dysfunction, Decreased range of motion, Hypomobility, Pain  Visit Diagnosis: Other muscle spasm  Chronic pain of right knee  Sacrococcygeal disorders, not elsewhere classified     Problem List Patient Active Problem List   Diagnosis Date Noted  . Interstitial cystitis 03/04/2019  . Obstructive sleep apnea 12/29/2018  . Hepatic steatosis 04/05/2018  . Hiatal hernia 09/09/2017  . Hx of adenomatous colonic polyps 07/15/2017  . Intermittent left lower quadrant abdominal pain 07/15/2017  . Allergic rhinitis, seasonal 06/14/2014  . Chronic neck pain 06/14/2014  . Gastroesophageal reflux disease without esophagitis 06/14/2014    Jerl Mina ,PT, DPT, E-RYT  04/13/2019, 15:00  PM  [place] MAIN [facility] SERVICES [address] [place], [place], [address] Phone: [phone]   Fax:  [phone]  Name: Jamarreon Mccullough MRN: [id number] Date of Birth: 06-Apr-1946

## 2019-04-14 ENCOUNTER — Other Ambulatory Visit: Payer: Self-pay

## 2019-04-15 ENCOUNTER — Inpatient Hospital Stay: Payer: Medicare HMO | Attending: Oncology

## 2019-04-15 ENCOUNTER — Other Ambulatory Visit: Payer: Self-pay

## 2019-04-15 DIAGNOSIS — D472 Monoclonal gammopathy: Secondary | ICD-10-CM

## 2019-04-15 LAB — COMPREHENSIVE METABOLIC PANEL
ALT: 17 U/L (ref 0–44)
AST: 24 U/L (ref 15–41)
Albumin: 3.7 g/dL (ref 3.5–5.0)
Alkaline Phosphatase: 25 U/L — ABNORMAL LOW (ref 38–126)
Anion gap: 7 (ref 5–15)
BUN: 23 mg/dL (ref 8–23)
CO2: 25 mmol/L (ref 22–32)
Calcium: 8.8 mg/dL — ABNORMAL LOW (ref 8.9–10.3)
Chloride: 106 mmol/L (ref 98–111)
Creatinine, Ser: 0.73 mg/dL (ref 0.61–1.24)
GFR calc Af Amer: >60 mL/min (ref >60)
GFR calc non Af Amer: >60 mL/min (ref >60)
Glucose, Bld: 117 mg/dL — ABNORMAL HIGH (ref 70–99)
Potassium: 4.2 mmol/L (ref 3.5–5.1)
Sodium: 138 mmol/L (ref 135–145)
Total Bilirubin: 0.5 mg/dL (ref 0.3–1.2)
Total Protein: 6.2 g/dL — ABNORMAL LOW (ref 6.5–8.1)

## 2019-04-15 LAB — CBC WITH DIFFERENTIAL/PLATELET
Abs Immature Granulocytes: 0.02 10*3/uL (ref 0.00–0.07)
Basophils Absolute: 0 10*3/uL (ref 0.0–0.1)
Basophils Relative: 1 %
Eosinophils Absolute: 0.1 10*3/uL (ref 0.0–0.5)
Eosinophils Relative: 2 %
HCT: 40.7 % (ref 39.0–52.0)
Hemoglobin: 13.9 g/dL (ref 13.0–17.0)
Immature Granulocytes: 0 %
Lymphocytes Relative: 16 %
Lymphs Abs: 1 10*3/uL (ref 0.7–4.0)
MCH: 31.4 pg (ref 26.0–34.0)
MCHC: 34.2 g/dL (ref 30.0–36.0)
MCV: 91.9 fL (ref 80.0–100.0)
Monocytes Absolute: 0.6 10*3/uL (ref 0.1–1.0)
Monocytes Relative: 9 %
Neutro Abs: 4.3 10*3/uL (ref 1.7–7.7)
Neutrophils Relative %: 72 %
Platelets: 145 10*3/uL — ABNORMAL LOW (ref 150–400)
RBC: 4.43 MIL/uL (ref 4.22–5.81)
RDW: 12.6 % (ref 11.5–15.5)
WBC: 6 10*3/uL (ref 4.0–10.5)
nRBC: 0 % (ref 0.0–0.2)

## 2019-04-16 LAB — KAPPA/LAMBDA LIGHT CHAINS
Kappa free light chain: 23.6 mg/L — ABNORMAL HIGH (ref 3.3–19.4)
Kappa, lambda light chain ratio: 1.86 — ABNORMAL HIGH (ref 0.26–1.65)
Lambda free light chains: 12.7 mg/L (ref 5.7–26.3)

## 2019-04-18 LAB — MULTIPLE MYELOMA PANEL, SERUM
Albumin SerPl Elph-Mcnc: 3.4 g/dL (ref 2.9–4.4)
Albumin/Glob SerPl: 1.5 (ref 0.7–1.7)
Alpha 1: 0.2 g/dL (ref 0.0–0.4)
Alpha2 Glob SerPl Elph-Mcnc: 0.7 g/dL (ref 0.4–1.0)
B-Globulin SerPl Elph-Mcnc: 0.8 g/dL (ref 0.7–1.3)
Gamma Glob SerPl Elph-Mcnc: 0.7 g/dL (ref 0.4–1.8)
Globulin, Total: 2.4 g/dL (ref 2.2–3.9)
IgA: 97 mg/dL (ref 61–437)
IgG (Immunoglobin G), Serum: 599 mg/dL — ABNORMAL LOW (ref 603–1613)
IgM (Immunoglobulin M), Srm: 168 mg/dL — ABNORMAL HIGH (ref 15–143)
M Protein SerPl Elph-Mcnc: 0.2 g/dL — ABNORMAL HIGH
Total Protein ELP: 5.8 g/dL — ABNORMAL LOW (ref 6.0–8.5)

## 2019-04-20 ENCOUNTER — Other Ambulatory Visit: Payer: Self-pay

## 2019-04-20 ENCOUNTER — Ambulatory Visit: Payer: Medicare HMO | Admitting: Physical Therapy

## 2019-04-20 DIAGNOSIS — M62838 Other muscle spasm: Secondary | ICD-10-CM

## 2019-04-20 DIAGNOSIS — M533 Sacrococcygeal disorders, not elsewhere classified: Secondary | ICD-10-CM

## 2019-04-20 DIAGNOSIS — G8929 Other chronic pain: Secondary | ICD-10-CM

## 2019-04-20 NOTE — Therapy (Signed)
[name] MAIN [facility] SERVICES [address] [place], [place], [address] Phone: [phone]   Fax:  [phone]  Physical Therapy Treatment  Patient Details  Name: Gregroy Barry MRN: [id number] Date of Birth: [birth year] Referring Provider (PT): Sn[place]y    Encounter Date: 04/20/2019  PT End of Session - 04/20/19 1452    Visit Number  14    Date for PT Re-Evaluation  05/11/19    Authorization - Visit Number  --   last progress note 03/16/19   PT Start Time  1330    PT Stop Time  1431    PT Time Calculation (min)  61 min    Activity Tolerance  Patient tolerated treatment well       Past Medical History:  Diagnosis Date  . Allergic rhinitis, seasonal 06/14/2014  . Allergy   . Arthritis   . Chronic neck pain 06/14/2014  . Complication of anesthesia   . Gastroesophageal reflux disease without esophagitis 06/14/2014  . Hepatic steatosis 04/05/2018  . Hiatal hernia 09/09/2017  . Intermittent left lower quadrant abdominal pain 07/15/2017  . PONV (postoperative nausea and vomiting)   . 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    Past Surgical History:  Procedure Laterality Date  . BACK SURGERY  2008   L4, L5  . COLONOSCOPY    . CYSTOSCOPY WITH BIOPSY N/A 07/17/2018   Procedure: CYSTOSCOPY WITH BLADDER BIOPSY;  Surgeon: Billey Co, MD;  Location: [facility] ORS;  Service: Urology;  Laterality: N/A;  . CYSTOSCOPY WITH FULGERATION N/A 07/17/2018   Procedure: CYSTOSCOPY WITH FULGERATION;  Surgeon: Billey Co, MD;  Location: [facility] ORS;  Service: Urology;  Laterality: N/A;  . ESOPHAGOGASTRODUODENOSCOPY    . HERNIA REPAIR  [phone]   umbilical  . STOMACH SURGERY     FUNDIC GLAND POLYP    There were no vitals filed for this visit.  Subjective Assessment - 04/20/19 1334    Subjective  Pt reports all exercises went well except for the heel raises which pt neede to hold onto a wall for balance.  Urgency is back at 30% intensity but not  like it was prior to PT.  Pt notices it is worse when he is geting up from the full sitting position from a recliner after propping feet up.    Pertinent History  Hx umbilical hernia, [id number] lumbar surgery, Denied falls , falls onto tailbone. R knee with OA with shots. 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 Hobbies: biking every other day 11 miles without  flexibilty routine.  Pt's bike seat is cushiony and the contact points are at the sitting bones. Bike has hydraulic system as shock absorption.  Pt has  performed sit ups/ crunches when in the [employer] [place], gardening, yard work.    Patient Stated Goals  eliminate urgency and burning snsation         [facility] PT Assessment - 04/20/19 1445      Coordination   Gross Motor Movements are Fluid and Coordinated  --   excessive ab overuse, bearing down on pelvic floor     Strength   Overall Strength Comments  MMT 5/5 R foot post Tx      Palpation   Palpation comment  DAB mm , adductor halucis mm, hypomobility medial cuneform on R, semimebranosus  ( increased mobility post Tx)  Pelvic Floor Special Questions - 04/20/19 1446    External Perineal Exam  through clothing: tightness  obt int, adductor magnus  R          [facility] Adult PT Treatment/Exercise - 04/20/19 1422      Neuro Re-ed    Neuro Re-ed Details   excessive cues for correcting dyscoordination , bearing down of pelvic floor, overuse of abs       Exercises   Exercises  --   see pt instructions      Manual Therapy   Manual therapy comments  STM to release tightness at intrinsic mm of R foot to increased toe abduction between rays, adductor mangus, obt int                    PT Long Term Goals - 04/13/19 1415      PT LONG TERM GOAL #1   Title  Pt will increase his score on LEFS from 57 pt to > 67pt in order to demo decreased pain with R knee for QOL ( 10/20: 68  pts)    Time  8    Period  Weeks    Status  Achieved      PT LONG TERM GOAL #2   Title  Pt will report decreased urinary frequency from 2x  every 2 hours to < 1 x every 2 hours in order to participate in community activities    Time  4    Period  Weeks    Status  Achieved      PT LONG TERM GOAL #3   Title  Pt will report decreased burning sensation with urination across 75% of the time in order to perform ADLs    Time  6    Period  Weeks    Status  Achieved      PT LONG TERM GOAL #4   Title  Pt will demo levelled iliac crest in standing and supine in order to progress to deep core strengthening exercises    Time  2    Period  Weeks    Status  Achieved      PT LONG TERM GOAL #5   Title  Pt will demo decreased lumbar mm tightness and increased shoulder abduction from 120 deg to 180 deg in order to have the mobility to minimize Sx    Time  2    Period  Weeks    Status  Achieved      Additional Long Term Goals   Additional Long Term Goals  Yes      PT LONG TERM GOAL #6   Title  Pt will be IND with flexibility routine and strengthening routine to have a well-rounded routine to minimize injuries/ R knee pain with repeated biking.    Time  10    Period  Weeks    Status  On-going      PT LONG TERM GOAL #7   Title  Pt will decrease score on NIH-CPSI from 53% to < 23% in order to restore urinary function  ( 02/09/19: 37%, 12/1: 23% )    Time  10    Period  Weeks    Status  Achieved      PT LONG TERM GOAL #8   Title  Pt will demo increased SLS without UE support from 22 sec on L, 16 sec on R to > 30 sec on BLE in order to improve balance, feet core to minimize fall risks  Time  4    Period  Weeks    Status  New    Target Date  05/11/19            Plan - 04/20/19 1452    Clinical Impression Statement  Addressed pt's report of 30% relapse of urinary frequency and urge. Pt demo'd increased R pelvic floor, hamstring, adductor magnus, and intrinsic foot tightness which  improved in mobility post Tx. Pt's PF strength increased from 4/5 to 5/5 but reps are still low and pt requires UE support. Pt also demo'd dysynergia which is likely related to his relapse of Sx. Pt demo'd proper coordination with less ab overuse to elicit pelvic floor contraction. Still withholding kegel strengthening as pt has had overactivity of pelvic floor. Pt demo'd more relaxation of global mm of back over the past Tx. Educated the importance of implementing body scan practice to maintain relaxation practices for optimal digestion, urinary functions, and mm flexibility. Pt continues to benefit from skilled PT   Personal Factors and Comorbidities  Fitness    Examination-Activity Limitations  Toileting    Stability/Clinical Decision Making  Evolving/Moderate complexity    Rehab Potential  Good    PT Frequency  1x / week    PT Duration  --   10   PT Treatment/Interventions  Balance training;Neuromuscular re-education;Manual techniques;Gait training;Moist Heat;Therapeutic activities;Therapeutic exercise;Taping;Stair training;Scar mobilization;Patient/family education    Consulted and Agree with Plan of Care  Patient       Patient will benefit from skilled therapeutic intervention in order to improve the following deficits and impairments:  Decreased endurance, Decreased strength, Decreased mobility, Decreased scar mobility, Increased muscle spasms, Improper body mechanics, Increased fascial restricitons, Decreased coordination, Decreased safety awareness, Decreased activity tolerance, Postural dysfunction, Decreased range of motion, Hypomobility, Pain  Visit Diagnosis: Other muscle spasm  Chronic pain of right knee  Sacrococcygeal disorders, not elsewhere classified     Problem List Patient Active Problem List   Diagnosis Date Noted  . Interstitial cystitis 03/04/2019  . Obstructive sleep apnea 12/29/2018  . Hepatic steatosis 04/05/2018  . Hiatal hernia 09/09/2017  . Hx of  adenomatous colonic polyps 07/15/2017  . Intermittent left lower quadrant abdominal pain 07/15/2017  . Allergic rhinitis, seasonal 06/14/2014  . Chronic neck pain 06/14/2014  . Gastroesophageal reflux disease without esophagitis 06/14/2014    Jerl Mina  ,PT, DPT, E-RYT  04/20/2019, 2:53 PM  [place] MAIN [facility] SERVICES [address] [place], [place], [address] Phone: [phone]   Fax:  [phone]  Name: Donald Barry MRN: [id number] Date of Birth: 03/04/1946

## 2019-04-20 NOTE — Patient Instructions (Signed)
Adding reclined twist to the stretches after hamstrings      Side of hip stretch:  Reclined twist for hips and side of the hips/ legs  Lay on your back, knees bend Scoot hips to the R , leave shoulders in place Drop knees to the L side resting onto pillows to keep leg at the same width of hips Pillow under L thigh to minimize too much strain   ___   Practice proper pelvic floor coordination  Inhale: expand pelvic floor muscles Exhale" "j" scoop, allow pelvic floor to close, lift first before belly sinks   ( not "draw abdominal muscle to spine" or strain with abdominal muscles")

## 2019-04-22 ENCOUNTER — Inpatient Hospital Stay (HOSPITAL_BASED_OUTPATIENT_CLINIC_OR_DEPARTMENT_OTHER): Payer: Medicare HMO | Admitting: Oncology

## 2019-04-22 ENCOUNTER — Other Ambulatory Visit: Payer: Self-pay

## 2019-04-22 ENCOUNTER — Encounter: Payer: Self-pay | Admitting: Oncology

## 2019-04-22 DIAGNOSIS — D472 Monoclonal gammopathy: Secondary | ICD-10-CM | POA: Diagnosis not present

## 2019-04-22 NOTE — Progress Notes (Signed)
Patient stated that he had been doing well with no complaints. 

## 2019-04-23 NOTE — Progress Notes (Signed)
I connected with Donald Barry on 04/23/19 at  2:30 PM EST by video enabled telemedicine visit and verified that I am speaking with the correct person using two identifiers.   I discussed the limitations, risks, security and privacy concerns of performing an evaluation and management service by telemedicine and the availability of in-person appointments. I also discussed with the patient that there may be a patient responsible charge related to this service. The patient expressed understanding and agreed to proceed.  Other persons participating in the visit and their role in the encounter:  none  Patient's location:  home Provider's location:  work  Risk analyst Complaint: Routine follow-up of IgM MGUS  History of present illness: patient is a 73 year old male with a past medical history of fatty liver and GERD who was seen by pulmonology clinic GI for symptoms of abdominal pain. As a part of that work-up patient had SPEP done Which showed mildly elevated IgM levels of 202. IgM monoclonal kappa light chain was noted on immunofixation 0.2 g. Hence the patient has been referred to [place]. Of note patient had a normal CBC with a white count of 4.1, H&H of 14.8/43.2 in February 2020. His platelet counts have always been mildly low in the 120s. CMP was unremarkable. Iron studies were normal. Patient also had CT of the abdomen done as a part of microscopic hematuria in February 2020 which did not reveal any evidence of splenomegaly  Results of blood work from 09/24/2018 were as follows: CBC showed white count of 4.7, H&H of 14.7/43.1.  Count of 147.  CMP was within normal limits.  Multiple myeloma panel showed M protein IgM kappa 0.3 g.  Serum free light chains revealed a mildly elevated kappa free light chain 21.6 with a kappa lambda light chain ratio of 1.86.   Interval history patient feels well overall and denies any complaints at this time.  Denies any drenching night sweats, unintentional weight loss  or aches and pains anywhere   Review of Systems  Constitutional: Negative for chills, fever, malaise/fatigue and weight loss.  HENT: Negative for congestion, ear discharge and nosebleeds.   Eyes: Negative for blurred vision.  Respiratory: Negative for cough, hemoptysis, sputum production, shortness of breath and wheezing.   Cardiovascular: Negative for chest pain, palpitations, orthopnea and claudication.  Gastrointestinal: Negative for abdominal pain, blood in stool, constipation, diarrhea, heartburn, melena, nausea and vomiting.  Genitourinary: Negative for dysuria, flank pain, frequency, hematuria and urgency.  Musculoskeletal: Negative for back pain, joint pain and myalgias.  Skin: Negative for rash.  Neurological: Negative for dizziness, tingling, focal weakness, seizures, weakness and headaches.  Endo/Heme/Allergies: Does not bruise/bleed easily.  Psychiatric/Behavioral: Negative for depression and suicidal ideas. The patient does not have insomnia.     Allergies  Allergen Reactions  . Tape Dermatitis    Skin blistered- steri strip   . Shellfish Allergy Diarrhea and Nausea And Vomiting    Shrimp   . Onion     diarrhea  . Other Nausea And Vomiting    general anesthesia  . Codeine Nausea Only    Past Medical History:  Diagnosis Date  . Allergic rhinitis, seasonal 06/14/2014  . Allergy   . Arthritis   . Chronic neck pain 06/14/2014  . Complication of anesthesia   . Gastroesophageal reflux disease without esophagitis 06/14/2014  . Hepatic steatosis 04/05/2018  . Hiatal hernia 09/09/2017  . Intermittent left lower quadrant abdominal pain 07/15/2017  . PONV (postoperative nausea and vomiting)   . Squamous cell skin  cancer 07/2017   Resected from scalp.    Past Surgical History:  Procedure Laterality Date  . BACK SURGERY  2008   L4, L5  . COLONOSCOPY    . CYSTOSCOPY WITH BIOPSY N/A 07/17/2018   Procedure: CYSTOSCOPY WITH BLADDER BIOPSY;  Surgeon: Billey Co, MD;   Location: [facility] ORS;  Service: Urology;  Laterality: N/A;  . CYSTOSCOPY WITH FULGERATION N/A 07/17/2018   Procedure: CYSTOSCOPY WITH FULGERATION;  Surgeon: Billey Co, MD;  Location: [facility] ORS;  Service: Urology;  Laterality: N/A;  . ESOPHAGOGASTRODUODENOSCOPY    . HERNIA REPAIR  [id number]   umbilical  . STOMACH SURGERY     FUNDIC GLAND POLYP    Social History   Socioeconomic History  . Marital status: Married    Spouse name: Not on file  . Number of children: Not on file  . Years of education: Not on file  . Highest education level: Not on file  Occupational History  . Not on file  Tobacco Use  . Smoking status: Never Smoker  . Smokeless tobacco: Never Used  Substance and Sexual Activity  . Alcohol use: Never  . Drug use: Never  . Sexual activity: Yes    Birth control/protection: None  Other Topics Concern  . Not on file  Social History Narrative  . Not on file   Social Determinants of Health   Financial Resource Strain:   . Difficulty of Paying Living Expenses: Not on file  Food Insecurity:   . Worried About Charity fundraiser in the Last Year: Not on file  . Ran Out of Food in the Last Year: Not on file  Transportation Needs:   . Lack of Transportation (Medical): Not on file  . Lack of Transportation (Non-Medical): Not on file  Physical Activity:   . Days of Exercise per Week: Not on file  . Minutes of Exercise per Session: Not on file  Stress:   . Feeling of Stress : Not on file  Social Connections:   . Frequency of Communication with Friends and Family: Not on file  . Frequency of Social Gatherings with Friends and Family: Not on file  . Attends Religious Services: Not on file  . Active Member of Clubs or Organizations: Not on file  . Attends Archivist Meetings: Not on file  . Marital Status: Not on file  Intimate Partner Violence:   . Fear of Current or Ex-Partner: Not on file  . Emotionally Abused: Not on file  . Physically Abused: Not on file   . Sexually Abused: Not on file    Family History  Problem Relation Age of Onset  . Hypertension Mother   . GER disease Sister   . GER disease Brother   . Prostate cancer Neg Hx   . Kidney cancer Neg Hx      Current Outpatient Medications:  .  acetaminophen (TYLENOL) 500 MG tablet, Take 1,000 mg by mouth every 6 (six) hours as needed for moderate pain or headache., Disp: , Rfl:  .  amitriptyline (ELAVIL) 50 MG tablet, TAKE 1 TABLET BY MOUTH EVERYDAY AT BEDTIME, Disp: 90 tablet, Rfl: 3 .  aspirin EC 81 MG tablet, Take 81 mg by mouth daily. , Disp: , Rfl:  .  fluticasone (FLONASE) 50 MCG/ACT nasal spray, Place 1 spray into the nose at bedtime. , Disp: , Rfl:  .  ibuprofen (ADVIL) 200 MG tablet, Take by mouth., Disp: , Rfl:  .  loratadine (ALAVERT) 10 MG  dissolvable tablet, Take 10 mg by mouth daily., Disp: , Rfl:  .  Menthol, Topical Analgesic, (BLUE-EMU MAXIMUM STRENGTH EX), Apply 1 application topically daily as needed (joint pain). , Disp: , Rfl:  .  Multiple Vitamin (MULTI-VITAMIN) tablet, Take by mouth., Disp: , Rfl:  .  omeprazole (PRILOSEC) 40 MG capsule, Take 40 mg by mouth daily., Disp: , Rfl:  .  Sodium Chloride-Sodium Bicarb (NETI POT SINUS [place] NA), Place 1 Dose into the nose at bedtime as needed (congestion)., Disp: , Rfl:  .  triamcinolone cream (KENALOG) 0.1 %, APPLY ON THE SKIN TWICE A DAY TO LEGS AS NEEDED. AVOID FACE, GROIN, UNDERARMS., Disp: , Rfl:   No results found.  No images are attached to the encounter.   CMP Latest Ref Rng & Units 04/15/2019  Glucose 70 - 99 mg/dL 117(H)  BUN 8 - 23 mg/dL 23  Creatinine 0.61 - 1.24 mg/dL 0.73  Sodium 135 - 145 mmol/L 138  Potassium 3.5 - 5.1 mmol/L 4.2  Chloride 98 - 111 mmol/L 106  CO2 22 - 32 mmol/L 25  Calcium 8.9 - 10.3 mg/dL 8.8(L)  Total Protein 6.5 - 8.1 g/dL 6.2(L)  Total Bilirubin 0.3 - 1.2 mg/dL 0.5  Alkaline Phos 38 - 126 U/L 25(L)  AST 15 - 41 U/L 24  ALT 0 - 44 U/L 17   CBC Latest Ref Rng & Units  04/15/2019  WBC 4.0 - 10.5 K/uL 6.0  Hemoglobin 13.0 - 17.0 g/dL 13.9  Hematocrit 39.0 - 52.0 % 40.7  Platelets 150 - 400 K/uL 145(L)     Observation/objective: Appears in no acute distress on video visit today.  Breathing is nonlabored  Assessment and plan: Patient is a 73 year old male with history of IgM MGUS here for routine follow-up  Clinically patient is doing well and no concerning symptoms of progression.  CBC is unremarkable except for mild thrombocytopenia which is remained stable over the last 6 months.Myeloma panel shows 0.2 IgM kappa light chain monoclonal gammopathy which has remained overall stable.  Serum free light chain ratio also remained stable around 1.8.  This can be monitored conservatively without the need for bone marrow biopsy at this time.  I will see him back in 1 year and he will need labs a week prior  Follow-up instructions: As above  I discussed the assessment and treatment plan with the patient. The patient was provided an opportunity to ask questions and all were answered. The patient agreed with the plan and demonstrated an understanding of the instructions.   The patient was advised to call back or seek an in-person evaluation if the symptoms worsen or if the condition fails to improve as anticipated.    Visit Diagnosis: 1. MGUS (monoclonal gammopathy of unknown significance)     Dr. Randa Evens, MD, MPH [facility] Pager- [phone] 04/23/2019 12:59 PM

## 2019-04-27 ENCOUNTER — Other Ambulatory Visit: Payer: Self-pay

## 2019-04-27 ENCOUNTER — Ambulatory Visit: Payer: Medicare HMO | Admitting: Physical Therapy

## 2019-04-27 DIAGNOSIS — G8929 Other chronic pain: Secondary | ICD-10-CM

## 2019-04-27 DIAGNOSIS — M533 Sacrococcygeal disorders, not elsewhere classified: Secondary | ICD-10-CM

## 2019-04-27 DIAGNOSIS — M62838 Other muscle spasm: Secondary | ICD-10-CM | POA: Diagnosis not present

## 2019-04-27 NOTE — Patient Instructions (Signed)
Stretch for pelvic floor   "v heels slide away and then back toward buttocks and then rock knee to slight ,  slide heel along at 11 o clock away from buttocks   10 reps

## 2019-04-27 NOTE — Therapy (Signed)
[place] MAIN [facility] SERVICES [address] [place], [place], [address] Phone: [phone]   Fax:  [phone]  Physical Therapy Treatment  Patient Details  Name: Donald Barry MRN: [id number] Date of Birth: 07/26/1945 Referring Provider (PT): Sninisky    Encounter Date: 04/27/2019  PT End of Session - 04/27/19 1502    Visit Number  15    Date for PT Re-Evaluation  05/11/19    Authorization - Visit Number  --   last progress note 03/16/19   PT Start Time  1401    PT Stop Time  1505    PT Time Calculation (min)  64 min    Activity Tolerance  Patient tolerated treatment well       Past Medical History:  Diagnosis Date  . Allergic rhinitis, seasonal 06/14/2014  . Allergy   . Arthritis   . Chronic neck pain 06/14/2014  . Complication of anesthesia   . Gastroesophageal reflux disease without esophagitis 06/14/2014  . Hepatic steatosis 04/05/2018  . Hiatal hernia 09/09/2017  . Intermittent left lower quadrant abdominal pain 07/15/2017  . PONV (postoperative nausea and vomiting)   . 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    Past Surgical History:  Procedure Laterality Date  . BACK SURGERY  2008   L4, L5  . COLONOSCOPY    . CYSTOSCOPY WITH BIOPSY N/A 07/17/2018   Procedure: CYSTOSCOPY WITH BLADDER BIOPSY;  Surgeon: Billey Co, MD;  Location: [facility] ORS;  Service: Urology;  Laterality: N/A;  . CYSTOSCOPY WITH FULGERATION N/A 07/17/2018   Procedure: CYSTOSCOPY WITH FULGERATION;  Surgeon: Billey Co, MD;  Location: [facility] ORS;  Service: Urology;  Laterality: N/A;  . ESOPHAGOGASTRODUODENOSCOPY    . HERNIA REPAIR  0000000   umbilical  . STOMACH SURGERY     FUNDIC GLAND POLYP    There were no vitals filed for this visit.  Subjective Assessment - 04/27/19 1403    Subjective  Pt noticed the bridge exercise was better after he stretched. Pt reported his urge has improved signficantly the past 5 days since noticing to not  overuse stomach muscles.  Pt was able to not get up in the middle of the night last night and was able to fall back asleep. Pt's last sleep study was done in 2019 and he has since been using a CPAP machine but it leaks air sometimes. Pt still gets up 4-6 times a night.    Pertinent History  Hx umbilical hernia, [id number] lumbar surgery, Denied falls , falls onto tailbone. R knee with OA with shots. 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 Hobbies: biking every other day 11 miles without  flexibilty routine.  Pt's bike seat is cushiony and the contact points are at the sitting bones. Bike has hydraulic system as shock absorption.  Pt has  performed sit ups/ crunches when in the [employer] [place], gardening, yard work.         [facility] PT Assessment - 04/27/19 1403      Coordination   Gross Motor Movements are Fluid and Coordinated  --   no more perturbation with posterior sling test   Fine Motor Movements are Fluid and Coordinated  --   multifidis, glut, hamstring sequence present B      Posture/Postural Control   Posture Comments  moderate cues for excessive obli      Strength   Overall Strength Comments  hip ext 4+/5                 Pelvic Floor Special Questions - 04/27/19 1456    External Perineal Exam  through clothing: tightness  R medial tuberosity tightness     External Palpation  R pelvic floor not activated on exhalation/ contraction cue. ( improved post Tx)         [facility] Adult PT Treatment/Exercise - 04/27/19 1403      Neuro Re-ed    Neuro Re-ed Details   cued for less oblique mm,  kegel        Manual Therapy   Manual therapy comments  STM/ MWM at R medial tuberosity                    PT Long Term Goals - 04/13/19 1415      PT LONG TERM GOAL #1   Title  Pt will increase his score on LEFS from 57 pt to > 67pt in order to demo decreased pain with R knee for QOL ( 10/20: 68  pts)    Time  8    Period  Weeks    Status  Achieved      PT LONG TERM GOAL #2   Title  Pt will report decreased urinary frequency from 2x  every 2 hours to < 1 x every 2 hours in order to participate in community activities    Time  4    Period  Weeks    Status  Achieved      PT LONG TERM GOAL #3   Title  Pt will report decreased burning sensation with urination across 75% of the time in order to perform ADLs    Time  6    Period  Weeks    Status  Achieved      PT LONG TERM GOAL #4   Title  Pt will demo levelled iliac crest in standing and supine in order to progress to deep core strengthening exercises    Time  2    Period  Weeks    Status  Achieved      PT LONG TERM GOAL #5   Title  Pt will demo decreased lumbar mm tightness and increased shoulder abduction from 120 deg to 180 deg in order to have the mobility to minimize Sx    Time  2    Period  Weeks    Status  Achieved      Additional Long Term Goals   Additional Long Term Goals  Yes      PT LONG TERM GOAL #6   Title  Pt will be IND with flexibility routine and strengthening routine to have a well-rounded routine to minimize injuries/ R knee pain with repeated biking.    Time  10    Period  Weeks    Status  On-going      PT LONG TERM GOAL #7   Title  Pt will decrease score on NIH-CPSI from 53% to < 23% in order to restore urinary function  ( 02/09/19: 37%, 12/1: 23% )    Time  10    Period  Weeks    Status  Achieved      PT LONG TERM GOAL #8   Title  Pt will demo increased SLS without UE support from 22 sec on L, 16 sec on R to > 30 sec on BLE in order to improve balance, feet core to minimize fall risks  Time  4    Period  Weeks    Status  New    Target Date  05/11/19            Plan - 04/27/19 1505    Clinical Impression Statement  Pt's urge is improving in the daytime hours as pt is correcting his dyscoordination of deep core mm and not pushing down on pelvic floor with overuse of abdominal mm.  Pt  was able to delay nighttime voiding for the first time last night but he has been getting up 4-5 x night. Upon further inquiry, pt had started using CPAP last Jan in 2019 and his nocturia decreased from 8 x night to 4-5x night. However, he notices his CPAP mask is leaking air sometimes. Pt was explained the importance of f/u with CPAP technician and ensure better fitting and was provided research on the corelation between OSA and nocturia.   Today's session included further external manual Tx to release tightness at obturator internus R which is likely related to R sided asymmetries in lower kinetic chain which has since been corrected. Pt's pelvic alignment remains levelled without shoe lift and posture remains upright. Anticipate as pt continues to build up feet arch, residual mm tightness at R pelvic floor can be minimized. Reviewed HEP stretches. Explained how to perform proper bridge exercise and rationale on selecting other exercises to minimize overactivity of pelvic floor.  Pt's R hip ext strength and multifidis coordination have improved.    Pt continued to require excessive cues to minimize oblique with deep core coordination. Pt demo'd correctly post Tx and was explained with anatomy images the continue between lower kinetic chain with feet to pelvic floor and neuroanatomy between feet and pelvic area. Explained relaxation impact on pelvic floor functions and minimizing mm tightness/ burning. Pt voiced understanding. Pt continues to benefit from skilled PT to achieve goals.      Personal Factors and Comorbidities  Fitness    Examination-Activity Limitations  Toileting    Stability/Clinical Decision Making  Evolving/Moderate complexity    Rehab Potential  Good    PT Frequency  1x / week    PT Duration  --   10   PT Treatment/Interventions  Balance training;Neuromuscular re-education;Manual techniques;Gait training;Moist Heat;Therapeutic activities;Therapeutic exercise;Taping;Stair  training;Scar mobilization;Patient/family education    Consulted and Agree with Plan of Care  Patient       Patient will benefit from skilled therapeutic intervention in order to improve the following deficits and impairments:  Decreased endurance, Decreased strength, Decreased mobility, Decreased scar mobility, Increased muscle spasms, Improper body mechanics, Increased fascial restricitons, Decreased coordination, Decreased safety awareness, Decreased activity tolerance, Postural dysfunction, Decreased range of motion, Hypomobility, Pain  Visit Diagnosis: Other muscle spasm  Chronic pain of right knee  Sacrococcygeal disorders, not elsewhere classified     Problem List Patient Active Problem List   Diagnosis Date Noted  . Interstitial cystitis 03/04/2019  . Obstructive sleep apnea 12/29/2018  . Thrombocytopenia ([place]) 09/30/2018  . Aortic atherosclerosis ([place]) 07/13/2018  . Hepatic steatosis 04/05/2018  . Hiatal hernia 09/09/2017  . Hx of adenomatous colonic polyps 07/15/2017  . Intermittent left lower quadrant abdominal pain 07/15/2017  . Allergic rhinitis, seasonal 06/14/2014  . Chronic neck pain 06/14/2014  . Gastroesophageal reflux disease without esophagitis 06/14/2014    Jerl Mina ,PT, DPT, E-RYT  04/27/2019, 3:06 PM  [place] MAIN [facility] SERVICES [address] [place], [place], [address] Phone: [phone]   Fax:  [phone]  Name: Donald Barry MRN: [id number] Date of Birth: Jan 26, 1946

## 2019-04-29 ENCOUNTER — Telehealth: Payer: Self-pay | Admitting: Urology

## 2019-04-29 ENCOUNTER — Other Ambulatory Visit: Payer: Self-pay

## 2019-04-29 ENCOUNTER — Telehealth (INDEPENDENT_AMBULATORY_CARE_PROVIDER_SITE_OTHER): Payer: Medicare HMO | Admitting: Urology

## 2019-04-29 DIAGNOSIS — N3010 Interstitial cystitis (chronic) without hematuria: Secondary | ICD-10-CM

## 2019-04-29 NOTE — Telephone Encounter (Signed)
-----   Message from Billey Co, MD sent at 04/29/2019  8:38 AM EST ----- Regarding: schedule cysto Please schedule clinic cysto in mid January, thanks  Nickolas Madrid, MD 04/29/2019

## 2019-04-29 NOTE — Progress Notes (Signed)
Virtual Visit via Telephone Note  I connected with Cortney Mccorquodale on 04/29/19 at  8:30 AM EST by telephone and verified that I am speaking with the correct person using two identifiers.   I discussed the limitations, risks, security and privacy concerns of performing an evaluation and management service by telephone and the availability of in person appointments. We discussed the impact of the COVID-19 on the healthcare system, and the importance of social distancing and reducing patient and provider exposure. I also discussed with the patient that there may be a patient responsible charge related to this service. The patient expressed understanding and agreed to proceed.  Reason for visit: Interstitial cystitis  History of Present Illness:  Mr. Giarraputo is a 73 year old male with interstitial cystitis currently managed on 50 mg amitriptyline nightly, and cimetidine 400 mg twice daily.  He is also seeing the pelvic floor physical therapist.  He feels all of these things have significantly improved his symptoms of dysuria, nocturia, urgency, and frequency.  He reports he has 2 to 3 days a week that are "bad days "and he has bothersome symptoms with urinary frequency up to every 45 minutes to an hour.  On the other days he feels like he does well and denies any significant problems.  Overall, he feels like he is doing well.  He is unsure what his triggers are that contribute to his bad days.  He denies any gross hematuria or flank pain.  Follow Up: Cystoscopy in 1 month to evaluate for recurrence of inflammatory lesions and possible resection   I discussed the assessment and treatment plan with the patient. The patient was provided an opportunity to ask questions and all were answered. The patient agreed with the plan and demonstrated an understanding of the instructions.   The patient was advised to call back or seek an in-person evaluation if the symptoms worsen or if the condition fails to improve  as anticipated.  I provided 13 minutes of non-face-to-face time during this encounter.   Billey Co, MD

## 2019-04-29 NOTE — Telephone Encounter (Signed)
Done

## 2019-05-04 ENCOUNTER — Other Ambulatory Visit: Payer: Self-pay

## 2019-05-04 ENCOUNTER — Ambulatory Visit: Payer: Medicare HMO | Admitting: Physical Therapy

## 2019-05-04 DIAGNOSIS — M62838 Other muscle spasm: Secondary | ICD-10-CM

## 2019-05-04 DIAGNOSIS — G8929 Other chronic pain: Secondary | ICD-10-CM

## 2019-05-04 DIAGNOSIS — M25561 Pain in right knee: Secondary | ICD-10-CM

## 2019-05-04 DIAGNOSIS — M533 Sacrococcygeal disorders, not elsewhere classified: Secondary | ICD-10-CM

## 2019-05-04 NOTE — Therapy (Signed)
[place] MAIN [facility] SERVICES [address] [place], [place], [address] Phone: [phone]   Fax:  [phone]  Physical Therapy Treatment  Patient Details  Name: Donald Barry MRN: [id number] Date of Birth: [birth year] Referring Provider (PT): Sninisky    Encounter Date: 05/04/2019  PT End of Session - 05/04/19 2141    Visit Number  17    Date for PT Re-Evaluation  05/11/19    Authorization - Visit Number  --   last progress note 03/16/19   PT Start Time  [id number]    PT Stop Time  1500    PT Time Calculation (min)  57 min    Activity Tolerance  Patient tolerated treatment well       Past Medical History:  Diagnosis Date  . Allergic rhinitis, seasonal 06/14/2014  . Allergy   . Arthritis   . Chronic neck pain 06/14/2014  . Complication of anesthesia   . Gastroesophageal reflux disease without esophagitis 06/14/2014  . Hepatic steatosis 04/05/2018  . Hiatal hernia 09/09/2017  . Intermittent left lower quadrant abdominal pain 07/15/2017  . PONV (postoperative nausea and vomiting)   . 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    Past Surgical History:  Procedure Laterality Date  . BACK SURGERY  2008   L4, L5  . COLONOSCOPY    . CYSTOSCOPY WITH BIOPSY N/A 07/17/2018   Procedure: CYSTOSCOPY WITH BLADDER BIOPSY;  Surgeon: Billey Co, MD;  Location: [facility] ORS;  Service: Urology;  Laterality: N/A;  . CYSTOSCOPY WITH FULGERATION N/A 07/17/2018   Procedure: CYSTOSCOPY WITH FULGERATION;  Surgeon: Billey Co, MD;  Location: [facility] ORS;  Service: Urology;  Laterality: N/A;  . ESOPHAGOGASTRODUODENOSCOPY    . HERNIA REPAIR  0000000   umbilical  . STOMACH SURGERY     FUNDIC GLAND POLYP    There were no vitals filed for this visit.  Subjective Assessment - 05/04/19 1404    Subjective  Urge has been good for the past 2 weeks. It has improved by 50%.  Burning with the sensation to go urination has decreased.  Pt has not called the PCP  about getting updated fitting for CPAP since it has been 2 years and for 2 night in a row, pt is not able to use it because the CPAP feels like he has a pillow over his face.    Pertinent History  Hx umbilical hernia, 99991111 lumbar surgery, Denied falls , falls onto tailbone. R knee with OA with shots. 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 Hobbies: biking every other day 11 miles without  flexibilty routine.  Pt's bike seat is cushiony and the contact points are at the sitting bones. Bike has hydraulic system as shock absorption.  Pt has  performed sit ups/ crunches when in the [employer] [place], gardening, yard work.         [facility] PT Assessment - 05/04/19 2134      Posture/Postural Control   Posture Comments  paradoxical breathing in standing when pt reported burning sensation. Cued for proper breathing technique: no burning sensation      Palpation   Palpation comment  increased tightness at  medial ischial tuberosity                    [facility] Adult PT Treatment/Exercise - 05/04/19 2057      Neuro Re-ed    Neuro Re-ed Details  standing deep core coordination , less chest breathing/ paradoxical breathing.       Moist Heat Therapy   Number Minutes Moist Heat  5 Minutes    Moist Heat Location  --   glut / sacrum     Manual Therapy   Manual therapy comments  STM, MWM at R medial ischiotuberosity, PA mob along coccyx / sacrococcogeus ligament .                     PT Long Term Goals - 04/13/19 1415      PT LONG TERM GOAL #1   Title  Pt will increase his score on LEFS from 57 pt to > 67pt in order to demo decreased pain with R knee for QOL ( 10/20: 68 pts)    Time  8    Period  Weeks    Status  Achieved      PT LONG TERM GOAL #2   Title  Pt will report decreased urinary frequency from 2x  every 2 hours to < 1 x every 2 hours in order to participate in community activities     Time  4    Period  Weeks    Status  Achieved      PT LONG TERM GOAL #3   Title  Pt will report decreased burning sensation with urination across 75% of the time in order to perform ADLs    Time  6    Period  Weeks    Status  Achieved      PT LONG TERM GOAL #4   Title  Pt will demo levelled iliac crest in standing and supine in order to progress to deep core strengthening exercises    Time  2    Period  Weeks    Status  Achieved      PT LONG TERM GOAL #5   Title  Pt will demo decreased lumbar mm tightness and increased shoulder abduction from 120 deg to 180 deg in order to have the mobility to minimize Sx    Time  2    Period  Weeks    Status  Achieved      Additional Long Term Goals   Additional Long Term Goals  Yes      PT LONG TERM GOAL #6   Title  Pt will be IND with flexibility routine and strengthening routine to have a well-rounded routine to minimize injuries/ R knee pain with repeated biking.    Time  10    Period  Weeks    Status  On-going      PT LONG TERM GOAL #7   Title  Pt will decrease score on NIH-CPSI from 53% to < 23% in order to restore urinary function  ( 02/09/19: 37%, 12/1: 23% )    Time  10    Period  Weeks    Status  Achieved      PT LONG TERM GOAL #8   Title  Pt will demo increased SLS without UE support from 22 sec on L, 16 sec on R to > 30 sec on BLE in order to improve balance, feet core to minimize fall risks    Time  4    Period  Weeks    Status  New    Target Date  05/11/19            Plan - 05/04/19 2142    Clinical Impression Statement  Pt continued to require  manual Tx to release posterior pelvic floor mm tightness in coccyx/ medial tuberosity area on R. Suspect this shortened area of mm is related to R LE being shorter but has been adjusted with shoe lift for many weeks. Pt achieved increased hip extension on on R post Tx.   Required further neuromuscular training to promote less chest breathing in standing position which  reproduced the the burning sensation that pt reported experiencing at random times but not with urination. Reproduction of burning sensation was reproduced several times with chest breathing and associated paradoxical movement of pelvic floor/ abdomen. When cued for proper diaphragmatic breathing/ pelvic floor/ abdomen, pt reported no burning.   Pt continues to benefit from skilled PT to achieve remaining goals.    Personal Factors and Comorbidities  Fitness    Examination-Activity Limitations  Toileting    Stability/Clinical Decision Making  Evolving/Moderate complexity    Rehab Potential  Good    PT Frequency  1x / week    PT Duration  --   10   PT Treatment/Interventions  Balance training;Neuromuscular re-education;Manual techniques;Gait training;Moist Heat;Therapeutic activities;Therapeutic exercise;Taping;Stair training;Scar mobilization;Patient/family education    Consulted and Agree with Plan of Care  Patient       Patient will benefit from skilled therapeutic intervention in order to improve the following deficits and impairments:  Decreased endurance, Decreased strength, Decreased mobility, Decreased scar mobility, Increased muscle spasms, Improper body mechanics, Increased fascial restricitons, Decreased coordination, Decreased safety awareness, Decreased activity tolerance, Postural dysfunction, Decreased range of motion, Hypomobility, Pain  Visit Diagnosis: Other muscle spasm  Chronic pain of right knee  Sacrococcygeal disorders, not elsewhere classified     Problem List Patient Active Problem List   Diagnosis Date Noted  . Interstitial cystitis 03/04/2019  . Obstructive sleep apnea 12/29/2018  . Thrombocytopenia ([place]) 09/30/2018  . Aortic atherosclerosis ([place]) 07/13/2018  . Hepatic steatosis 04/05/2018  . Hiatal hernia 09/09/2017  . Hx of adenomatous colonic polyps 07/15/2017  . Intermittent left lower quadrant abdominal pain 07/15/2017  . Allergic rhinitis,  seasonal 06/14/2014  . Chronic neck pain 06/14/2014  . Gastroesophageal reflux disease without esophagitis 06/14/2014    Jerl Mina ,PT, DPT, E-RYT  05/04/2019, 9:43 PM  [place] MAIN [facility] SERVICES [address] [place], [place], [address] Phone: [phone]   Fax:  [phone]  Name: Donald Barry MRN: [id number] Date of Birth: 1946-02-09

## 2019-05-04 NOTE — Patient Instructions (Signed)
hip stretch On floor, both feet to one side of hip, knees bent  rocking towards front knee and back to back buttocks sitting on a folded blanket to support the back buttocks  also practice standing breathing with diaphragm expansion which relaxes the pelvic floor, not chest breathing to decrease burning sensation

## 2019-05-11 ENCOUNTER — Ambulatory Visit: Payer: Medicare HMO | Admitting: Physical Therapy

## 2019-05-11 ENCOUNTER — Other Ambulatory Visit: Payer: Self-pay

## 2019-05-11 DIAGNOSIS — M62838 Other muscle spasm: Secondary | ICD-10-CM

## 2019-05-11 DIAGNOSIS — M533 Sacrococcygeal disorders, not elsewhere classified: Secondary | ICD-10-CM

## 2019-05-11 DIAGNOSIS — G8929 Other chronic pain: Secondary | ICD-10-CM

## 2019-05-11 NOTE — Therapy (Signed)
[place] MAIN [facility] SERVICES [address] [place], [place], [address] Phone: [phone]   Fax:  [phone]  Physical Therapy Treatment    Patient Details  Name: Donald Barry MRN: [id number] Date of Birth: 1946-04-20 Referring Provider (PT): Sninisky    Encounter Date: 05/11/2019  PT End of Session - 05/11/19 1558    Visit Number  18    Date for PT Re-Evaluation  05/11/19    Authorization - Visit Number  --   last progress note 03/16/19   PT Start Time  1400    PT Stop Time  1500    PT Time Calculation (min)  60 min    Activity Tolerance  Patient tolerated treatment well       Past Medical History:  Diagnosis Date  . Allergic rhinitis, seasonal 06/14/2014  . Allergy   . Arthritis   . Chronic neck pain 06/14/2014  . Complication of anesthesia   . Gastroesophageal reflux disease without esophagitis 06/14/2014  . Hepatic steatosis 04/05/2018  . Hiatal hernia 09/09/2017  . Intermittent left lower quadrant abdominal pain 07/15/2017  . PONV (postoperative nausea and vomiting)   . 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    Past Surgical History:  Procedure Laterality Date  . BACK SURGERY  2008   L4, L5  . COLONOSCOPY    . CYSTOSCOPY WITH BIOPSY N/A 07/17/2018   Procedure: CYSTOSCOPY WITH BLADDER BIOPSY;  Surgeon: Billey Co, MD;  Location: [facility] ORS;  Service: Urology;  Laterality: N/A;  . CYSTOSCOPY WITH FULGERATION N/A 07/17/2018   Procedure: CYSTOSCOPY WITH FULGERATION;  Surgeon: Billey Co, MD;  Location: [facility] ORS;  Service: Urology;  Laterality: N/A;  . ESOPHAGOGASTRODUODENOSCOPY    . HERNIA REPAIR  0000000   umbilical  . STOMACH SURGERY     FUNDIC GLAND POLYP    There were no vitals filed for this visit.  Subjective Assessment - 05/11/19 1406    Subjective  Pt reports the burning sensation in the low ab occurs mostly during digestion. Pt did contact his PCP about getting an updated sleep study. Pt also  contacted Dr. Al Pimple about taking probiotics.    Pertinent History  Hx umbilical hernia, [id number] lumbar surgery, Denied falls , falls onto tailbone. R knee with OA with shots. 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 Hobbies: biking every other day 11 miles without  flexibilty routine.  Pt's bike seat is cushiony and the contact points are at the sitting bones. Bike has hydraulic system as shock absorption.  Pt has  performed sit ups/ crunches when in the [employer] [place], gardening, yard work.         [facility] PT Assessment - 05/11/19 1555      Coordination   Gross Motor Movements are Fluid and Coordinated  --   minor cues for seated dipahragmatic breathing                   [facility] Adult PT Treatment/Exercise - 05/11/19 1554      Therapeutic Activites    Other Therapeutic Activities  consolidated and organized and reviewed HEP, distinguished which to do daily versus alternating days . referred pt nutritionist but pt declined.       Neuro Re-ed    Neuro Re-ed Details   reviewed seated diaphragmatic breathing  PT Long Term Goals - 05/11/19 1451      PT LONG TERM GOAL #1   Title  Pt will increase his score on LEFS from 57 pt to > 67pt in order to demo decreased pain with R knee for QOL ( 10/20: 68 pts)    Time  8    Period  Weeks    Status  Achieved      PT LONG TERM GOAL #2   Title  Pt will report decreased urinary frequency from 2x  every 2 hours to < 1 x every 2 hours in order to participate in community activities    Time  4    Period  Weeks    Status  Achieved      PT LONG TERM GOAL #3   Title  Pt will report decreased burning sensation with urination across 75% of the time in order to perform ADLs    Time  6    Period  Weeks    Status  Achieved      PT LONG TERM GOAL #4   Title  Pt will demo levelled iliac crest in standing and supine in order to  progress to deep core strengthening exercises    Time  2    Period  Weeks    Status  Achieved      PT LONG TERM GOAL #5   Title  Pt will demo decreased lumbar mm tightness and increased shoulder abduction from 120 deg to 180 deg in order to have the mobility to minimize Sx    Time  2    Period  Weeks    Status  Achieved      PT LONG TERM GOAL #6   Title  Pt will be IND with flexibility routine and strengthening routine to have a well-rounded routine to minimize injuries/ R knee pain with repeated biking.    Time  10    Period  Weeks    Status  Achieved      PT LONG TERM GOAL #7   Title  Pt will decrease score on NIH-CPSI from 53% to < 23% in order to restore urinary function  ( 02/09/19: 37%, 12/1: 23% )    Time  10    Period  Weeks    Status  Achieved      PT LONG TERM GOAL #8   Title  Pt will demo increased SLS without UE support from 22 sec on L, 16 sec on R to > 30 sec on BLE in order to improve balance, feet core to minimize fall risks   ( 12/29: 32 sec R, 60 sec on L )    Time  4    Period  Weeks    Status  Achieved            Plan - 05/11/19 1558    Clinical Impression Statement  Pt has achieved 8/8 goals with decreased urinary frequency,  decreased burning sensation with urination across 75% of the time, improved scores on LEFS and NIH-CPSI which indicates improved knee pain and pelvic floor function.   Pt's MSK improvements below have facilitated his overall improvements with his goals: _ leg length difference, pelvic girdle alignment, knee pain addressed with shoe lift  _scoliosis addressed with customized manual Tx and HEP _pelvic floor coordination without straining / overuse of ab _upper body/ spinal/ lower kinetic chain tightness significantly decreased _upright posture improved, stacked diaphragm over pelvis with proper movement of deep core mm (  diaphragm, pelvic floor transverse ab)   _decreased R sided pelvic floor tightness ( 2/2 shorter leg) with  customized stretches  Pt will be on a self-maintanance program for 2 months before d/c to ensure pt is IND with HEP which was reviewed today and reorganized for better understanding. Pt was referred to nutritionist as pt noticed burning sensation at low abdominal area during digestion and after eating certain foods but pt declined.   Plan to d/c at next visit or d/c if pt does not require next session upon communication.     Personal Factors and Comorbidities  Fitness    Examination-Activity Limitations  Toileting    Stability/Clinical Decision Making  Evolving/Moderate complexity    Rehab Potential  Good    PT Frequency  1x / week    PT Duration  --   10   PT Treatment/Interventions  Balance training;Neuromuscular re-education;Manual techniques;Gait training;Moist Heat;Therapeutic activities;Therapeutic exercise;Taping;Stair training;Scar mobilization;Patient/family education    Consulted and Agree with Plan of Care  Patient       Patient will benefit from skilled therapeutic intervention in order to improve the following deficits and impairments:  Decreased endurance, Decreased strength, Decreased mobility, Decreased scar mobility, Increased muscle spasms, Improper body mechanics, Increased fascial restricitons, Decreased coordination, Decreased safety awareness, Decreased activity tolerance, Postural dysfunction, Decreased range of motion, Hypomobility, Pain  Visit Diagnosis: Other muscle spasm  Chronic pain of right knee  Sacrococcygeal disorders, not elsewhere classified     Problem List Patient Active Problem List   Diagnosis Date Noted  . Interstitial cystitis 03/04/2019  . Obstructive sleep apnea 12/29/2018  . Thrombocytopenia ([place]) 09/30/2018  . Aortic atherosclerosis ([place]) 07/13/2018  . Hepatic steatosis 04/05/2018  . Hiatal hernia 09/09/2017  . Hx of adenomatous colonic polyps 07/15/2017  . Intermittent left lower quadrant abdominal pain 07/15/2017  . Allergic  rhinitis, seasonal 06/14/2014  . Chronic neck pain 06/14/2014  . Gastroesophageal reflux disease without esophagitis 06/14/2014    Jerl Mina ,PT, DPT, E-RYT  05/11/2019, 3:58 PM  [place] MAIN [facility] SERVICES [address] [place], [place], [address] Phone: [phone]   Fax:  [phone]  Name: Donald Barry MRN: [id number] Date of Birth: Mar 04, 1946

## 2019-05-11 NOTE — Patient Instructions (Signed)
Balance       Balance Each foot toes spread 30 seconds each foot   Midback / spine     Daily  6 Way neck stretch  5 each  Daily  Angel wings  15 reps  Daily  Open book  15  reps each  Daily   Bear scratching at door way  Bear Hug 15 breaths  Daily  Trunk rotation Hands on hips left and right 10 reps  Daily  Side bends Hands over head left and right 10 reps  Daily  Cat and Cow  10 reps morning               Strengthening Deep core and posterior core muscles    alternate  Multifidis twist Green band on door handle opposing side, close door, stand perpendicular to door. Twist upper torso away from door 30 reps each side  Daily  Deep Core I -  10 reps   Daily  Deep Core II   6 mins          Strengthening / Balance / Feet arch strengthening    alternate  Step-up and down slow  Step-up and down slow to increase Plantar fascia 30 reps each leg  alternate  Glut strengthening Blue band right under door, back, then sidestep to the right. Blue band left leg without side step. 30 reps each leg  alternate  Heel raises, single leg with  hand on wall on the side of your body 15 reps  each , work up to 20 reps          Pelvic floor / lower extremity stretches      Daily  Pelvic Floor stretch I     Daily  Pelvic Floor stretch II - hip V heels slide and then back towards buttocks and then rock knee to slight sldie heel along at 11 o'clock away from buttocks. 10 reps  Daily  Glut/hamstrings/quad/IT Band with old belt   Seated on floor, heels to R hip , knees bent, rocking toward frontL  knee, and then back to R sitting bone   10 reps both side   Daily  Toe stretch/Calf stretches Lunge left and right 20 reps each        Daily Relaxation body scan      seated breathing

## 2019-06-02 ENCOUNTER — Ambulatory Visit (INDEPENDENT_AMBULATORY_CARE_PROVIDER_SITE_OTHER): Payer: Medicare HMO | Admitting: Urology

## 2019-06-02 ENCOUNTER — Other Ambulatory Visit: Payer: Self-pay

## 2019-06-02 ENCOUNTER — Encounter: Payer: Self-pay | Admitting: Urology

## 2019-06-02 VITALS — BP 108/72 | HR 98 | Ht 72.0 in | Wt 195.8 lb

## 2019-06-02 DIAGNOSIS — N3010 Interstitial cystitis (chronic) without hematuria: Secondary | ICD-10-CM

## 2019-06-02 DIAGNOSIS — N329 Bladder disorder, unspecified: Secondary | ICD-10-CM

## 2019-06-02 LAB — URINALYSIS, COMPLETE
Bilirubin, UA: NEGATIVE
Glucose, UA: NEGATIVE
Ketones, UA: NEGATIVE
Nitrite, UA: NEGATIVE
Protein,UA: NEGATIVE
Specific Gravity, UA: 1.015 (ref 1.005–1.030)
Urobilinogen, Ur: 0.2 mg/dL (ref 0.2–1.0)
pH, UA: 5 (ref 5.0–7.5)

## 2019-06-02 LAB — MICROSCOPIC EXAMINATION
Epithelial Cells (non renal): NONE SEEN /hpf (ref 0–10)
RBC, Urine: NONE SEEN /hpf (ref 0–2)

## 2019-06-02 MED ORDER — LIDOCAINE HCL URETHRAL/MUCOSAL 2 % EX GEL
1.0000 "application " | Freq: Once | CUTANEOUS | Status: AC
  Administered 2019-06-02: 1 via URETHRAL

## 2019-06-02 NOTE — Progress Notes (Signed)
Cystoscopy Procedure Note:  Indication: Interstitial cystitis, history of inflammatory bladder lesions/Hunner's ulcers  After informed consent and discussion of the procedure and its risks, Donald Barry was positioned and prepped in the standard fashion. Cystoscopy was performed with a flexible cystoscope. The urethra, bladder neck and entire bladder was visualized in a standard fashion. The prostate was moderate in size. The ureteral orifices were visualized in their normal location and orientation. On retroflexion there was a 1cm patch of bullous erythema at the trigone.  Findings: 1cm patch of bullous erythema at trigone  Assessment and Plan: We discussed options at length including ongoing medical management of his interstitial cystitis, versus proceeding to the operating room for cystoscopy, biopsy, fulguration, and hydrodistention.  Risks and benefits discussed at length.  He strongly would like to proceed to the operating room, as he had significant improvement in his symptoms after his last fulguration in March 2020.  Hopefully, now that he is better controlled on cimetidine and amitriptyline, this will give him a longer period of relief after fulguration from his severe dysuria, frequency, and nocturia.  Schedule cystoscopy, bladder biopsy and fulguration, hydrodistention  Nickolas Madrid, MD 06/02/2019

## 2019-06-02 NOTE — Patient Instructions (Signed)
Bladder Biopsy A bladder biopsy is a procedure to remove a small sample of tissue from the bladder. The procedure is done so that the tissue can be examined under a microscope. You may have a bladder biopsy to diagnose or rule out cancer of the bladder. During a bladder biopsy, your health care provider may insert a long, thin scope with a lighted camera (cystoscope) into the urethra and move it into your bladder. The cystoscope will allow your health care provider to check the lining of the urethra and bladder and remove the tissue sample. If your health care provider also needs to check your ureters, a longer tube (ureteroscope) may be used. Tell your health care provider about:  Any allergies you have.  All medicines you are taking, including vitamins, herbs, eye drops, creams, and over-the-counter medicines.  Any problems you or family members have had with anesthetic medicines.  Any blood disorders you have.  Any surgeries you have had.  Any medical conditions you have.  Whether you are pregnant or may be pregnant. What are the risks? Generally, this is a safe procedure. However, problems may occur, including:  Bleeding.  Infection, especially a urinary tract infection (UTI).  Allergic reactions to medicines.  Damage to nearby structures or organs, including the urethra, bladder, or ureters.  Abdominal pain.  Burning or pain during urination.  Narrowing of the urethra due to scar tissue.  Difficulty urinating due to swelling. What happens before the procedure? Medicines Ask your health care provider about:  Changing or stopping your regular medicines. This is especially important if you are taking diabetes medicines or blood thinners.  Taking medicines such as aspirin and ibuprofen. These medicines can thin your blood. Do not take these medicines unless your health care provider tells you to take them.  Taking over-the-counter medicines, vitamins, herbs, and  supplements. Surgery safety Ask your health care provider:  How your surgery site will be marked.  What steps will be taken to help prevent infection. These may include: ? Removing hair at the surgery site. ? Washing skin with a germ-killing soap. ? Receiving antibiotic medicine. General instructions  Follow instructions from your health care provider about eating and drinking restrictions.  You may be asked to drink plenty of fluids.  You may be asked to urinate right before the procedure. You may have a urine sample taken for UTI testing.  Plan to have someone take you home from the hospital or clinic.  If you will be going home right after the procedure, plan to have someone with you for 24 hours. What happens during the procedure?   An IV may be inserted into one of your veins.  You may be given one or more of the following: ? A medicine to help you relax (sedative). ? A medicine to numb the opening of the urethra (local anesthetic). ? A medicine to make you fall asleep (general anesthetic).  You will lie on your back with your knees bent and spread apart.  The cystoscope or ureteroscope will be inserted into your urethra and guided into your bladder or ureters.  Your bladder may be slowly filled with germ-free (sterile) water. This will make it easier for your health care provider to view the wall or lining of your bladder.  Small instruments will be inserted through the scope to collect a small tissue sample that will be examined under a microscope. The procedure may vary among health care providers and hospitals. What happens after the procedure?    Your blood pressure, heart rate, breathing rate, and blood oxygen level will be monitored until you leave the hospital or clinic.  You may be asked to empty your bladder, or your bladder may be emptied for you.  Do not drive for 24 hours if you received a sedative. Summary  A bladder biopsy is a procedure to remove a  small sample of tissue from the bladder.  You may have a bladder biopsy to diagnose or rule out cancer of the bladder.  Follow instructions from your health care provider about eating and drinking restrictions.  Ask your health care provider if you need to stop or change any medicines that you are taking.  If you will be going home right after the procedure, plan to have someone with you for 24 hours. This information is not intended to replace advice given to you by your health care provider. Make sure you discuss any questions you have with your health care provider. Document Revised: 11/04/2018 Document Reviewed: 11/04/2018 Elsevier Patient Education  2020 [employer]  

## 2019-06-09 ENCOUNTER — Other Ambulatory Visit: Payer: Medicare HMO

## 2019-06-09 ENCOUNTER — Other Ambulatory Visit: Payer: Self-pay | Admitting: Urology

## 2019-06-09 ENCOUNTER — Other Ambulatory Visit: Payer: Self-pay

## 2019-06-09 DIAGNOSIS — N329 Bladder disorder, unspecified: Secondary | ICD-10-CM

## 2019-06-09 DIAGNOSIS — N3010 Interstitial cystitis (chronic) without hematuria: Secondary | ICD-10-CM

## 2019-06-10 ENCOUNTER — Encounter
Admission: RE | Admit: 2019-06-10 | Discharge: 2019-06-10 | Disposition: A | Discharge status: Home / Self Care | Payer: Medicare HMO | Source: Ambulatory Visit | Attending: Urology | Admitting: Urology

## 2019-06-10 HISTORY — DX: Sleep apnea, unspecified: G47.30

## 2019-06-10 HISTORY — DX: Headache, unspecified: R51.9

## 2019-06-10 NOTE — Patient Instructions (Signed)
Your procedure is scheduled on: 06-17-19 THURSDAY Report to Same Day Surgery 2nd floor medical mall [facility] Entrance-take elevator on left to 2nd floor.  Check in with surgery information desk.) To find out your arrival time please call [phone] between 1PM - 3PM on 06-16-19 [facility]  Remember: Instructions that are not followed completely may result in serious medical risk, up to and including death, or upon the discretion of your surgeon and anesthesiologist your surgery may need to be rescheduled.    _x___ 1. Do not eat food after midnight the night before your procedure. NO GUM OR CANDY AFTER MIDNIGHT. You may drink clear liquids up to 2 hours before you are scheduled to arrive at the hospital for your procedure.  Do not drink clear liquids within 2 hours of your scheduled arrival to the hospital.  Clear liquids include  --Water or Apple juice without pulp  --Gatorade  --Black Coffee or Clear Tea (No milk, no creamers, do not add anything to the coffee or Tea   ____Ensure clear carbohydrate drink on the way to the hospital for bariatric patients  ____Ensure clear carbohydrate drink 3 hours before surgery.    __x__ 2. No Alcohol for 24 hours before or after surgery.   __x__3. No Smoking or e-cigarettes for 24 prior to surgery.  Do not use any chewable tobacco products for at least 6 hour prior to surgery   ____  4. Bring all medications with you on the day of surgery if instructed.    __x__ 5. Notify your doctor if there is any change in your medical condition     (cold, fever, infections).    x___6. On the morning of surgery brush your teeth with toothpaste and water.  You may rinse your mouth with mouth wash if you wish.  Do not swallow any toothpaste or mouthwash.   Do not wear jewelry, make-up, hairpins, clips or nail polish.  Do not wear lotions, powders, or perfumes. You may wear deodorant.  Do not shave 48 hours prior to surgery. Men may shave face and neck.  Do  not bring valuables to the hospital.    [facility] is not responsible for any belongings or valuables.               Contacts, dentures or bridgework may not be worn into surgery.  Leave your suitcase in the car. After surgery it may be brought to your room.  For patients admitted to the hospital, discharge time is determined by your treatment team.  _  Patients discharged the day of surgery will not be allowed to drive home.  You will need someone to drive you home and stay with you the night of your procedure.    Please read over the following fact sheets that you were given:   [facility] Preparing for Surgery   _x___ TAKE THE FOLLOWING MEDICATION THE MORNING OF SURGERY WITH A SMALL SIP OF WATER. These include:  1. PRILOSEC (OMEPRAZOLE)  2. TAKE AN EXTRA PRILOSEC THE NIGHT BEFORE YOUR SURGERY  3.  4.  5.  6.  ____Fleets enema or Magnesium Citrate as directed.   ____ Use CHG Soap or sage wipes as directed on instruction sheet   ____ Use inhalers on the day of surgery and bring to hospital day of surgery  ____ Stop Metformin and Janumet 2 days prior to surgery.    ____ Take 1/2 of usual insulin dose the night before surgery and none on the morning  surgery.   _x___ Follow recommendations from Cardiologist, Pulmonologist or PCP regarding stopping Aspirin, Coumadin, Plavix ,Eliquis, Effient, or Pradaxa, and Pletal-PT STOPPED ASPIRIN YESTERDAY (06-10-19)  X____Stop Anti-inflammatories such as Advil, Aleve, Ibuprofen, Motrin, Naproxen, Naprosyn, Goodies powders or aspirin products NOW-OK to take Tylenol   _x___ Stop supplements until after surgery.   _X___ Bring C-Pap to the hospital.

## 2019-06-11 LAB — URINALYSIS, COMPLETE
Bilirubin, UA: NEGATIVE
Glucose, UA: NEGATIVE
Ketones, UA: NEGATIVE
Leukocytes,UA: NEGATIVE
Nitrite, UA: NEGATIVE
Specific Gravity, UA: 1.025 (ref 1.005–1.030)
Urobilinogen, Ur: 0.2 mg/dL (ref 0.2–1.0)
pH, UA: 5 (ref 5.0–7.5)

## 2019-06-11 LAB — MICROSCOPIC EXAMINATION: Bacteria, UA: NONE SEEN

## 2019-06-12 LAB — CULTURE, URINE COMPREHENSIVE

## 2019-06-14 ENCOUNTER — Encounter
Admission: RE | Admit: 2019-06-14 | Discharge: 2019-06-14 | Disposition: A | Discharge status: Home / Self Care | Payer: Medicare HMO | Source: Ambulatory Visit | Attending: Orthopedic Surgery | Admitting: Orthopedic Surgery

## 2019-06-14 ENCOUNTER — Other Ambulatory Visit: Payer: Self-pay

## 2019-06-14 DIAGNOSIS — Z01818 Encounter for other preprocedural examination: Secondary | ICD-10-CM | POA: Diagnosis present

## 2019-06-14 DIAGNOSIS — Z20822 Contact with and (suspected) exposure to covid-19: Secondary | ICD-10-CM | POA: Diagnosis not present

## 2019-06-14 LAB — SARS CORONAVIRUS 2 (TAT 6-24 HRS): SARS Coronavirus 2: NEGATIVE

## 2019-06-15 ENCOUNTER — Other Ambulatory Visit: Admission: RE | Admit: 2019-06-15 | Payer: Medicare HMO | Source: Ambulatory Visit

## 2019-06-17 ENCOUNTER — Ambulatory Visit: Payer: Medicare HMO | Admitting: Anesthesiology

## 2019-06-17 ENCOUNTER — Encounter: Payer: Self-pay | Admitting: Urology

## 2019-06-17 ENCOUNTER — Ambulatory Visit
Admission: RE | Admit: 2019-06-17 | Discharge: 2019-06-17 | Disposition: A | Discharge status: Home / Self Care | Payer: Medicare HMO | Attending: Urology | Admitting: Urology

## 2019-06-17 ENCOUNTER — Encounter
Admission: RE | Disposition: A | Discharge status: Home / Self Care | Payer: Self-pay | Source: Home / Self Care | Attending: Urology

## 2019-06-17 ENCOUNTER — Other Ambulatory Visit: Payer: Self-pay

## 2019-06-17 DIAGNOSIS — N3010 Interstitial cystitis (chronic) without hematuria: Secondary | ICD-10-CM | POA: Insufficient documentation

## 2019-06-17 DIAGNOSIS — G4730 Sleep apnea, unspecified: Secondary | ICD-10-CM | POA: Diagnosis not present

## 2019-06-17 DIAGNOSIS — Z85828 Personal history of other malignant neoplasm of skin: Secondary | ICD-10-CM | POA: Diagnosis not present

## 2019-06-17 DIAGNOSIS — Z79899 Other long term (current) drug therapy: Secondary | ICD-10-CM | POA: Insufficient documentation

## 2019-06-17 DIAGNOSIS — N329 Bladder disorder, unspecified: Secondary | ICD-10-CM

## 2019-06-17 DIAGNOSIS — Z7982 Long term (current) use of aspirin: Secondary | ICD-10-CM | POA: Insufficient documentation

## 2019-06-17 DIAGNOSIS — K219 Gastro-esophageal reflux disease without esophagitis: Secondary | ICD-10-CM | POA: Diagnosis not present

## 2019-06-17 DIAGNOSIS — J302 Other seasonal allergic rhinitis: Secondary | ICD-10-CM | POA: Insufficient documentation

## 2019-06-17 HISTORY — PX: CYSTO WITH HYDRODISTENSION: SHX5453

## 2019-06-17 HISTORY — PX: CYSTOSCOPY WITH BIOPSY: SHX5122

## 2019-06-17 HISTORY — PX: CYSTOSCOPY WITH FULGERATION: SHX6638

## 2019-06-17 SURGERY — CYSTOSCOPY, WITH BIOPSY
Anesthesia: General

## 2019-06-17 MED ORDER — FENTANYL CITRATE (PF) 100 MCG/2ML IJ SOLN
INTRAMUSCULAR | Status: DC | PRN
  Administered 2019-06-17: 50 ug via INTRAVENOUS
  Administered 2019-06-17 (×3): 25 ug via INTRAVENOUS
  Administered 2019-06-17: 50 ug via INTRAVENOUS

## 2019-06-17 MED ORDER — DEXAMETHASONE SODIUM PHOSPHATE 10 MG/ML IJ SOLN
INTRAMUSCULAR | Status: AC
  Filled 2019-06-17: qty 1

## 2019-06-17 MED ORDER — HYDROCODONE-ACETAMINOPHEN 5-325 MG PO TABS: 1.0000 | ORAL_TABLET | ORAL | 0 refills | Status: AC | PRN

## 2019-06-17 MED ORDER — MIDAZOLAM HCL 2 MG/2ML IJ SOLN
INTRAMUSCULAR | Status: AC
  Filled 2019-06-17: qty 2

## 2019-06-17 MED ORDER — ONDANSETRON HCL 4 MG/2ML IJ SOLN
INTRAMUSCULAR | Status: DC | PRN
  Administered 2019-06-17: 4 mg via INTRAVENOUS

## 2019-06-17 MED ORDER — PHENYLEPHRINE HCL (PRESSORS) 10 MG/ML IV SOLN
INTRAVENOUS | Status: DC | PRN
  Administered 2019-06-17: 200 ug via INTRAVENOUS
  Administered 2019-06-17 (×2): 50 ug via INTRAVENOUS

## 2019-06-17 MED ORDER — FENTANYL CITRATE (PF) 100 MCG/2ML IJ SOLN
INTRAMUSCULAR | Status: AC
  Filled 2019-06-17: qty 2

## 2019-06-17 MED ORDER — CEFAZOLIN SODIUM-DEXTROSE 2-4 GM/100ML-% IV SOLN
INTRAVENOUS | Status: AC
  Filled 2019-06-17: qty 100

## 2019-06-17 MED ORDER — LACTATED RINGERS IV SOLN: INTRAVENOUS | Status: DC

## 2019-06-17 MED ORDER — PROPOFOL 10 MG/ML IV BOLUS
INTRAVENOUS | Status: DC | PRN
  Administered 2019-06-17: 160 mg via INTRAVENOUS

## 2019-06-17 MED ORDER — LIDOCAINE HCL (CARDIAC) PF 100 MG/5ML IV SOSY
PREFILLED_SYRINGE | INTRAVENOUS | Status: DC | PRN
  Administered 2019-06-17: 100 mg via INTRAVENOUS

## 2019-06-17 MED ORDER — DEXAMETHASONE SODIUM PHOSPHATE 10 MG/ML IJ SOLN
INTRAMUSCULAR | Status: DC | PRN
  Administered 2019-06-17: 10 mg via INTRAVENOUS

## 2019-06-17 MED ORDER — ONDANSETRON HCL 4 MG/2ML IJ SOLN
INTRAMUSCULAR | Status: AC
  Filled 2019-06-17: qty 2

## 2019-06-17 MED ORDER — CEFAZOLIN SODIUM-DEXTROSE 2-4 GM/100ML-% IV SOLN
2.0000 g | INTRAVENOUS | Status: AC
  Administered 2019-06-17: 09:00:00 2 g via INTRAVENOUS

## 2019-06-17 MED ORDER — MIDAZOLAM HCL 2 MG/2ML IJ SOLN
INTRAMUSCULAR | Status: DC | PRN
  Administered 2019-06-17: 1 mg via INTRAVENOUS

## 2019-06-17 SURGICAL SUPPLY — 21 items
BAG DRAIN CYSTO-URO LG1000N (MISCELLANEOUS) ×3 IMPLANT
BRUSH SCRUB EZ  4% CHG (MISCELLANEOUS) ×2
BRUSH SCRUB EZ 4% CHG (MISCELLANEOUS) ×1 IMPLANT
DRSG TELFA 4X3 1S NADH ST (GAUZE/BANDAGES/DRESSINGS) ×3 IMPLANT
ELECT REM PT RETURN 9FT ADLT (ELECTROSURGICAL) ×3
ELECTRODE REM PT RTRN 9FT ADLT (ELECTROSURGICAL) ×1 IMPLANT
GLOVE BIOGEL PI IND STRL 7.5 (GLOVE) ×1 IMPLANT
GLOVE BIOGEL PI INDICATOR 7.5 (GLOVE) ×2
GOWN STRL REUS W/ TWL LRG LVL3 (GOWN DISPOSABLE) ×1 IMPLANT
GOWN STRL REUS W/ TWL XL LVL3 (GOWN DISPOSABLE) ×1 IMPLANT
GOWN STRL REUS W/TWL LRG LVL3 (GOWN DISPOSABLE) ×2
GOWN STRL REUS W/TWL XL LVL3 (GOWN DISPOSABLE) ×2
GUIDEWIRE STR DUAL SENSOR (WIRE) IMPLANT
KIT TURNOVER CYSTO (KITS) ×3 IMPLANT
PACK CYSTO AR (MISCELLANEOUS) ×3 IMPLANT
SET CYSTO W/LG BORE CLAMP LF (SET/KITS/TRAYS/PACK) ×3 IMPLANT
SOL .9 NS 3000ML IRR  AL (IV SOLUTION) ×2
SOL .9 NS 3000ML IRR UROMATIC (IV SOLUTION) ×1 IMPLANT
SURGILUBE 2OZ TUBE FLIPTOP (MISCELLANEOUS) ×3 IMPLANT
WATER STERILE IRR 1000ML POUR (IV SOLUTION) ×3 IMPLANT
WATER STERILE IRR 3000ML UROMA (IV SOLUTION) ×3 IMPLANT

## 2019-06-17 NOTE — Anesthesia Procedure Notes (Signed)
Procedure Name: LMA Insertion Date/Time: 06/17/2019 8:47 AM Performed by: Doreen Salvage, CRNA Pre-anesthesia Checklist: Patient identified, Patient being monitored, Timeout performed, Emergency Drugs available and Suction available Patient Re-evaluated:Patient Re-evaluated prior to induction Oxygen Delivery Method: Circle system utilized Preoxygenation: Pre-oxygenation with 100% oxygen Induction Type: IV induction Ventilation: Mask ventilation without difficulty LMA: LMA inserted Tube type: Oral Number of attempts: 1 Placement Confirmation: positive ETCO2 and breath sounds checked- equal and bilateral Tube secured with: Tape Dental Injury: Teeth and Oropharynx as per pre-operative assessment

## 2019-06-17 NOTE — H&P (Signed)
06/17/19 8:21 AM   Donald Barry Dec 30, 1945 [id number]   HPI: 74 year old male with interstitial cystitis and history of bladder biopsy in March 2020 that showed cystitis and inflammation and no malignancy.  He had significant improvement in his urinary symptoms after resection of these abnormal lesions.  On clinic cystoscopy he was found to have recurrent lesions with worsening symptoms of urgency, frequency, and dysuria.  He has elected for biopsy, fulguration, and hydrodistention.  He denies any chest pain, shortness of breath, or gross hematuria.   PMH: Past Medical History:  Diagnosis Date  . Allergic rhinitis, seasonal 06/14/2014  . Allergy   . Arthritis   . Chronic neck pain 06/14/2014  . Complication of anesthesia   . Gastroesophageal reflux disease without esophagitis 06/14/2014  . Headache    h/o migraines  . Hepatic steatosis 04/05/2018  . Hiatal hernia 09/09/2017  . Intermittent left lower quadrant abdominal pain 07/15/2017  . PONV (postoperative nausea and vomiting)   . Sleep apnea    cpap  . 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    Surgical History: Past Surgical History:  Procedure Laterality Date  . BACK SURGERY  2008   L4, L5  . COLONOSCOPY    . CYSTOSCOPY WITH BIOPSY N/A 07/17/2018   Procedure: CYSTOSCOPY WITH BLADDER BIOPSY;  Surgeon: Billey Co, MD;  Location: [facility] ORS;  Service: Urology;  Laterality: N/A;  . CYSTOSCOPY WITH FULGERATION N/A 07/17/2018   Procedure: CYSTOSCOPY WITH FULGERATION;  Surgeon: Billey Co, MD;  Location: [facility] ORS;  Service: Urology;  Laterality: N/A;  . ESOPHAGOGASTRODUODENOSCOPY    . HERNIA REPAIR  0000000   umbilical  . STOMACH SURGERY     FUNDIC GLAND POLYP     Allergies:  Allergies  Allergen Reactions  . Tape Dermatitis    Skin blistered- steri strip   . Shellfish Allergy Diarrhea and Nausea And Vomiting    Shrimp   . Onion     diarrhea  . Other Nausea And Vomiting    general anesthesia   . Codeine Nausea Only    Family History: Family History  Problem Relation Age of Onset  . Hypertension Mother   . GER disease Sister   . GER disease Brother   . Prostate cancer Neg Hx   . Kidney cancer Neg Hx     Social History:  reports that he has never smoked. He has never used smokeless tobacco. He reports that he does not drink alcohol or use drugs.  ROS: Please see flowsheet from today's date for complete review of systems.  Physical Exam: BP (!) 174/94   Pulse 82   Temp 98 F (36.7 C) (Oral)   Resp 14   Ht 6' (1.829 m)   Wt 86.2 kg   SpO2 99%   BMI 25.77 kg/m    Constitutional:  Alert and oriented, No acute distress. Cardiovascular: Regular rate and rhythm Respiratory: Clear to auscultation bilaterally GI: Abdomen is soft, nontender, nondistended, no abdominal masses Lymph: No cervical or inguinal lymphadenopathy. Skin: No rashes, bruises or suspicious lesions. Neurologic: Grossly intact, no focal deficits, moving all 4 extremities. Psychiatric: Normal mood and affect.  Laboratory Data: Reviewed, urine culture 1/27 mixed urogenital flora   Assessment & Plan:   In summary, he is a 74 year old male with interstitial cystitis and new inflammatory bladder lesions on clinic cystoscopy who is elected for biopsy, fulguration, and hydrodistention.  We discussed the risks and benefits at length including bleeding, infection,  worsening pain, and recurrence of his symptoms.  Cystoscopy, bladder biopsy and fulguration, hydrodistention today.   Billey Co, [facility] [address] [place], [place] [address] [phone]

## 2019-06-17 NOTE — OR Nursing (Signed)
Dr. Diamantina Providence in to see patient 1042 am

## 2019-06-17 NOTE — Discharge Instructions (Signed)

## 2019-06-17 NOTE — Anesthesia Postprocedure Evaluation (Signed)
Anesthesia Post Note  Patient: Donald Barry  Procedure(s) Performed: CYSTOSCOPY WITH BLADDER  BIOPSY (N/A ) CYSTOSCOPY/HYDRODISTENSION (N/A ) CYSTOSCOPY WITH FULGERATION (N/A )  Patient location during evaluation: PACU Anesthesia Type: General Level of consciousness: awake and alert Pain management: pain level controlled Vital Signs Assessment: post-procedure vital signs reviewed and stable Respiratory status: spontaneous breathing and respiratory function stable Cardiovascular status: stable Anesthetic complications: no     Last Vitals:  Vitals:   06/17/19 0805 06/17/19 0940  BP: (!) 174/94 120/90  Pulse: 82 74  Resp: 14 12  Temp: 36.7 C (!) 35.9 C  SpO2: 99% 99%    Last Pain:  Vitals:   06/17/19 0955  TempSrc:   PainSc: 0-No pain                 Anushka Hartinger K

## 2019-06-17 NOTE — Anesthesia Preprocedure Evaluation (Signed)
Anesthesia Evaluation  Patient identified by MRN, date of birth, ID band Patient awake    Reviewed: Allergy & Precautions, NPO status , Patient's Chart, lab work & pertinent test results  History of Anesthesia Complications (+) PONV and history of anesthetic complications  Airway Mallampati: II       Dental   Pulmonary neg shortness of breath, sleep apnea and Continuous Positive Airway Pressure Ventilation , neg COPD, Not current smoker,           Cardiovascular (-) hypertension(-) Past MI and (-) CHF (-) dysrhythmias (-) Valvular Problems/Murmurs     Neuro/Psych neg Seizures    GI/Hepatic Neg liver ROS, hiatal hernia, GERD  Medicated and Controlled,  Endo/Other  neg diabetes  Renal/GU negative Renal ROS     Musculoskeletal   Abdominal   Peds  Hematology   Anesthesia Other Findings   Reproductive/Obstetrics                            Anesthesia Physical Anesthesia Plan  ASA: II  Anesthesia Plan: General   Post-op Pain Management:    Induction:   PONV Risk Score and Plan: 3 and Ondansetron, Dexamethasone and Treatment may vary due to age or medical condition  Airway Management Planned: LMA  Additional Equipment:   Intra-op Plan:   Post-operative Plan:   Informed Consent: I have reviewed the patients History and Physical, chart, labs and discussed the procedure including the risks, benefits and alternatives for the proposed anesthesia with the patient or authorized representative who has indicated his/her understanding and acceptance.       Plan Discussed with:   Anesthesia Plan Comments:         Anesthesia Quick Evaluation

## 2019-06-17 NOTE — Transfer of Care (Signed)
Immediate Anesthesia Transfer of Care Note  Patient: Donald Barry  Procedure(s) Performed: CYSTOSCOPY WITH BLADDER  BIOPSY (N/A ) CYSTOSCOPY/HYDRODISTENSION (N/A ) CYSTOSCOPY WITH FULGERATION (N/A )  Patient Location: PACU  Anesthesia Type:General  Level of Consciousness: drowsy  Airway & Oxygen Therapy: Patient Spontanous Breathing and Patient connected to face mask oxygen  Post-op Assessment: Report given to RN and Post -op Vital signs reviewed and stable  Post vital signs: Reviewed and stable  Last Vitals:  Vitals Value Taken Time  BP 120/90 06/17/19 0940  Temp 35.9 C 06/17/19 0940  Pulse 74 06/17/19 0940  Resp 12 06/17/19 0940  SpO2 99 % 06/17/19 0940    Last Pain:  Vitals:   06/17/19 0940  TempSrc:   PainSc: Asleep         Complications: No apparent anesthesia complications

## 2019-06-17 NOTE — Op Note (Signed)
Date of procedure: 06/17/19  Preoperative diagnosis:  1. Interstitial cystitis  Postoperative diagnosis:  1. Same  Procedure: 1. Cystoscopy, bladder biopsy and fulguration 2. Hydrodistention  Surgeon: Nickolas Madrid, MD  Anesthesia: General  Complications: None  Intraoperative findings:  1.  Ureteral orifices orthotopic bilaterally 2.  Severe inflammation at the posterior trigone and posterior bladder wall 3.  Biopsy and fulguration of all abnormal appearing tissue, hydrodistention at 80 cm of water for 5 minutes. 4.  Excellent hemostasis  EBL: Minimal  Specimens: Bladder biopsy  Drains: None  Indication: Donald Barry is a 74 y.o. 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  He had significant symptom relief after this procedure, and was recently found to have recurrence of multiple inflammatory lesions on clinic cystoscopy and worsening symptoms.  He elected for the above procedure for symptom relief.  After reviewing the management options for treatment, they elected to proceed with the above surgical procedure(s). We have discussed the potential benefits and risks of the procedure, side effects of the proposed treatment, the likelihood of the patient achieving the goals of the procedure, and any potential problems that might occur during the procedure or recuperation. Informed consent has been obtained.  Description of procedure:  The patient was taken to the operating room and general anesthesia was induced. SCDs were placed for DVT prophylaxis. The patient was placed in the dorsal lithotomy position, prepped and draped in the usual sterile fashion, and preoperative antibiotics(Ancef) were administered. A preoperative time-out was performed.   The 21 French rigid cystoscope was used to intubate the urethra.  A normal-appearing urethra was followed proximally into  the bladder.  The prostate was small.  Thorough cystoscopy was performed.  The ureteral orifices were orthotopic bilaterally.  The right ureteral orifice had a volcano-like appearance.  There was significant erythema and inflammation at the posterior trigone and posterior bladder wall.  The cold cup biopsy forceps were used to remove the abnormal tissue at the posterior bladder wall, and the Bugbee was used for meticulous hemostasis.  All abnormal tissue was fulgurated.  The ureteral orifices were intact at the conclusion of the procedure.  The fluid was raised to a level of 80 cm of water and the bladder was filled to capacity.  Hydrodistension was performed for a total of 5 minutes, then drained.  There was some mild oozing around the prior sites of fulguration and these were fulgurated.  The bladder was drained and there was excellent hemostasis.  This concluded our procedure.  Disposition: Stable to PACU  Plan: Call with pathology results Virtual visit in 4 weeks for symptom check Continue amitriptyline and cimetidine for interstitial cystitis  Nickolas Madrid, MD

## 2019-06-18 ENCOUNTER — Telehealth: Payer: Self-pay

## 2019-06-18 LAB — SURGICAL PATHOLOGY

## 2019-06-18 NOTE — Telephone Encounter (Signed)
See my chart message

## 2019-06-18 NOTE — Telephone Encounter (Signed)
-----   Message from Billey Co, MD sent at 06/18/2019  2:58 PM EST ----- No cancer seen in bladder biopsy, just similar severe inflammation like last time. Continue meds and follow up as scheduled  Nickolas Madrid, MD 06/18/2019

## 2019-06-25 NOTE — Therapy (Unsigned)
[place] [facility]IN [facility] [address] [place], [place], [address] Phone: [phone]   Fax:  [phone]  Patient Details  Name: Donald Barry MRN: [id number] Date of Birth: 28-Sep-1945 Referring Provider:  Billey Co, MD  Encounter Date: 06/29/2019   Discharge Summary from 12/29/18 to 05/11/19 across 18 visits  Plan - 05/11/19 1558    Clinical Impression Statement  Pt has achieved 8/8 goals with decreased urinary frequency,  decreased burning sensation with urination across 75% of the time, improved scores on LEFS and NIH-CPSI which indicates improved knee pain and pelvic floor function.   Pt's MSK improvements below have facilitated his overall improvements with his goals: _ leg length difference, pelvic girdle alignment, knee pain addressed with shoe lift  _scoliosis addressed with customized manual Tx and HEP _pelvic floor coordination without straining / overuse of ab _upper body/ spinal/ lower kinetic chain tightness significantly decreased _upright posture improved, stacked diaphragm over pelvis with proper movement of deep core mm ( diaphragm, pelvic floor transverse ab)   _decreased R sided pelvic floor tightness ( 2/2 shorter leg) with customized stretches _Pt's R shoulder also improved in ROM / scapular stabilization which enabled him to perform HEP with less pain  Pt was able to be on a self-maintanance program for 2 months and is ready for d/c at this time.    PT Long Term Goals - 05/11/19 1451      PT LONG TERM GOAL #1   Title  Pt will increase his score on LEFS from 57 pt to > 67pt in order to demo decreased pain with R knee for QOL ( 10/20: 68 pts)    Time  8    Period  Weeks    Status  Achieved      PT LONG TERM GOAL #2   Title  Pt will report decreased urinary frequency from 2x  every 2 hours to < 1 x every 2 hours in order to participate in community activities    Time  4    Period  Weeks    Status  Achieved       PT LONG TERM GOAL #3   Title  Pt will report decreased burning sensation with urination across 75% of the time in order to perform ADLs    Time  6    Period  Weeks    Status  Achieved      PT LONG TERM GOAL #4   Title  Pt will demo levelled iliac crest in standing and supine in order to progress to deep core strengthening exercises    Time  2    Period  Weeks    Status  Achieved      PT LONG TERM GOAL #5   Title  Pt will demo decreased lumbar mm tightness and increased shoulder abduction from 120 deg to 180 deg in order to have the mobility to minimize Sx    Time  2    Period  Weeks    Status  Achieved      PT LONG TERM GOAL #6   Title  Pt will be IND with flexibility routine and strengthening routine to have a well-rounded routine to minimize injuries/ R knee pain with repeated biking.    Time  10    Period  Weeks    Status  Achieved      PT LONG TERM GOAL #7   Title  Pt will decrease score on NIH-CPSI from 53%  to < 23% in order to restore urinary function  ( 02/09/19: 37%, 12/1: 23% )    Time  10    Period  Weeks    Status  Achieved      PT LONG TERM GOAL #8   Title  Pt will demo increased SLS without UE support from 22 sec on L, 16 sec on R to > 30 sec on BLE in order to improve balance, feet core to minimize fall risks   ( 12/29: 32 sec R, 60 sec on L )    Time  4    Period  Weeks    Status  Achieved         Jerl Mina  PT, DPT, E-RYT Pelvic Health Physical Therapist [email or web address] [phone]   06/25/2019, 12:42 PM  [place], [place], [address] Phone: [phone]   Fax:  [phone]

## 2019-06-29 ENCOUNTER — Telehealth (INDEPENDENT_AMBULATORY_CARE_PROVIDER_SITE_OTHER): Payer: Medicare HMO | Admitting: Urology

## 2019-06-29 ENCOUNTER — Telehealth: Payer: Medicare HMO | Admitting: Urology

## 2019-06-29 ENCOUNTER — Other Ambulatory Visit: Payer: Self-pay

## 2019-06-29 ENCOUNTER — Ambulatory Visit: Payer: Medicare HMO | Admitting: Physical Therapy

## 2019-06-29 DIAGNOSIS — N3010 Interstitial cystitis (chronic) without hematuria: Secondary | ICD-10-CM | POA: Diagnosis not present

## 2019-06-29 NOTE — Progress Notes (Signed)
Virtual Visit via Telephone Note  I connected with Donald Barry on 06/29/19 at  1:15 PM EST by telephone and verified that I am speaking with the correct person using two identifiers.   I discussed the limitations, risks, security and privacy concerns of performing an evaluation and management service by telephone and the availability of in person appointments. We discussed the impact of the COVID-19 pandemic on the healthcare system, and the importance of social distancing and reducing patient and provider exposure. I also discussed with the patient that there may be a patient responsible charge related to this service. The patient expressed understanding and agreed to proceed.  Reason for visit: IC  History of Present Illness: 74 year old male with interstitial cystitis and history of bladder biopsy in March 2020 that showed cystitis and inflammation and no malignancy.  He had significant improvement in his urinary symptoms after resection of these abnormal lesions.  On clinic cystoscopy he was found to have recurrent lesions with worsening symptoms of urgency, frequency, and dysuria.    He underwent biopsy, fulguration, and hydrodistention on 06/17/2019.  This showed severe inflammation at the posterior trigone and posterior bladder wall, and biopsy showed inflamed ulcerated urothelium with prominent eosinophilic infiltrate.  There was no evidence of malignancy.  He has continued amitriptyline and cimetidine postop.  Overall, he feels like he is doing well since surgery.  He had a little bit of hematuria over the weekend, but that is improved with increasing his fluid intake.  He had improvement of his dysuria postop with over-the-counter Pyridium.  He continues to have urinary frequency, however he is drinking large quantities of fluids.  We discussed his pathology results at length.  Could consider a Solu-Medrol Dosepak in the future for an acute flare.  I also mentioned Donald Bene, MD is a  local urologist to specializes in interstitial cystitis and chronic bladder pain, and may be worth seeing for second opinion if he does not get the results he is hoping for after the surgery.  We again reviewed, and IC flares and return precautions.  Follow Up: 66-month virtual visit symptom check, sooner if problems   I discussed the assessment and treatment plan with the patient. The patient was provided an opportunity to ask questions and all were answered. The patient agreed with the plan and demonstrated an understanding of the instructions.   The patient was advised to call back or seek an in-person evaluation if the symptoms worsen or if the condition fails to improve as anticipated.  I provided 15 minutes of non-face-to-face time during this encounter.   Donald Co, MD

## 2019-07-01 ENCOUNTER — Emergency Department
Admission: EM | Admit: 2019-07-01 | Discharge: 2019-07-01 | Disposition: A | Discharge status: Home / Self Care | Payer: Medicare HMO | Attending: Emergency Medicine | Admitting: Emergency Medicine

## 2019-07-01 ENCOUNTER — Other Ambulatory Visit: Payer: Self-pay

## 2019-07-01 DIAGNOSIS — Z48816 Encounter for surgical aftercare following surgery on the genitourinary system: Secondary | ICD-10-CM | POA: Insufficient documentation

## 2019-07-01 DIAGNOSIS — R319 Hematuria, unspecified: Secondary | ICD-10-CM | POA: Diagnosis present

## 2019-07-01 DIAGNOSIS — R3914 Feeling of incomplete bladder emptying: Secondary | ICD-10-CM | POA: Diagnosis not present

## 2019-07-01 DIAGNOSIS — Z85828 Personal history of other malignant neoplasm of skin: Secondary | ICD-10-CM | POA: Insufficient documentation

## 2019-07-01 DIAGNOSIS — R310 Gross hematuria: Secondary | ICD-10-CM | POA: Insufficient documentation

## 2019-07-01 DIAGNOSIS — Z79899 Other long term (current) drug therapy: Secondary | ICD-10-CM | POA: Diagnosis not present

## 2019-07-01 DIAGNOSIS — Z7982 Long term (current) use of aspirin: Secondary | ICD-10-CM | POA: Insufficient documentation

## 2019-07-01 DIAGNOSIS — R39198 Other difficulties with micturition: Secondary | ICD-10-CM | POA: Diagnosis not present

## 2019-07-01 DIAGNOSIS — R350 Frequency of micturition: Secondary | ICD-10-CM | POA: Diagnosis not present

## 2019-07-01 DIAGNOSIS — R1030 Lower abdominal pain, unspecified: Secondary | ICD-10-CM | POA: Insufficient documentation

## 2019-07-01 LAB — URINALYSIS, COMPLETE (UACMP) WITH MICROSCOPIC
Bacteria, UA: NONE SEEN
RBC / HPF: >50 RBC/hpf — ABNORMAL HIGH (ref 0–5)
Specific Gravity, Urine: 1.022 (ref 1.005–1.030)
Squamous Epithelial / HPF: NONE SEEN (ref 0–5)

## 2019-07-01 LAB — CBC
HCT: 39.7 % (ref 39.0–52.0)
Hemoglobin: 13.7 g/dL (ref 13.0–17.0)
MCH: 31 pg (ref 26.0–34.0)
MCHC: 34.5 g/dL (ref 30.0–36.0)
MCV: 89.8 fL (ref 80.0–100.0)
Platelets: 140 10*3/uL — ABNORMAL LOW (ref 150–400)
RBC: 4.42 MIL/uL (ref 4.22–5.81)
RDW: 12.5 % (ref 11.5–15.5)
WBC: 7.4 10*3/uL (ref 4.0–10.5)
nRBC: 0 % (ref 0.0–0.2)

## 2019-07-01 LAB — BASIC METABOLIC PANEL
Anion gap: 6 (ref 5–15)
BUN: 17 mg/dL (ref 8–23)
CO2: 27 mmol/L (ref 22–32)
Calcium: 8.6 mg/dL — ABNORMAL LOW (ref 8.9–10.3)
Chloride: 108 mmol/L (ref 98–111)
Creatinine, Ser: 0.9 mg/dL (ref 0.61–1.24)
GFR calc Af Amer: >60 mL/min (ref >60)
GFR calc non Af Amer: >60 mL/min (ref >60)
Glucose, Bld: 103 mg/dL — ABNORMAL HIGH (ref 70–99)
Potassium: 3.7 mmol/L (ref 3.5–5.1)
Sodium: 141 mmol/L (ref 135–145)

## 2019-07-01 LAB — PROTIME-INR
INR: 1.1 (ref 0.8–1.2)
Prothrombin Time: 13.7 seconds (ref 11.4–15.2)

## 2019-07-01 LAB — APTT: aPTT: 37 seconds — ABNORMAL HIGH (ref 24–36)

## 2019-07-01 MED ORDER — MORPHINE SULFATE (PF) 4 MG/ML IV SOLN
4.0000 mg | Freq: Once | INTRAVENOUS | Status: AC
  Administered 2019-07-01: 4 mg via INTRAVENOUS
  Filled 2019-07-01: qty 1

## 2019-07-01 MED ORDER — ONDANSETRON HCL 4 MG/2ML IJ SOLN
4.0000 mg | Freq: Once | INTRAMUSCULAR | Status: AC
  Administered 2019-07-01: 4 mg via INTRAVENOUS
  Filled 2019-07-01: qty 2

## 2019-07-01 MED ORDER — BELLADONNA ALKALOIDS-OPIUM 16.2-30 MG RE SUPP: 30.0000 mg | Freq: Three times a day (TID) | RECTAL | 0 refills | Status: DC | PRN

## 2019-07-01 MED ORDER — HYDROCODONE-ACETAMINOPHEN 5-325 MG PO TABS: 1.0000 | ORAL_TABLET | Freq: Four times a day (QID) | ORAL | 0 refills | Status: DC | PRN

## 2019-07-01 MED ORDER — SODIUM CHLORIDE 0.9 % IV BOLUS
1000.0000 mL | Freq: Once | INTRAVENOUS | Status: AC
  Administered 2019-07-01: 1000 mL via INTRAVENOUS

## 2019-07-01 MED ORDER — PHENAZOPYRIDINE HCL 200 MG PO TABS
200.0000 mg | ORAL_TABLET | Freq: Once | ORAL | Status: AC
  Administered 2019-07-01: 200 mg via ORAL
  Filled 2019-07-01: qty 1

## 2019-07-01 MED ORDER — KETOROLAC TROMETHAMINE 30 MG/ML IJ SOLN: 15.0000 mg | Freq: Once | INTRAMUSCULAR | Status: DC

## 2019-07-01 NOTE — ED Notes (Signed)
Bladder scanned again to confirm and post residual void is 70mL

## 2019-07-01 NOTE — ED Notes (Signed)
Greater than 24ml in bladder post urination.

## 2019-07-01 NOTE — ED Triage Notes (Signed)
Pt to ED via EMS from home. Per pt he had surgery on bladder on 2/4, pt startwed having blood in urine this past Monday, this morning pt had more blood in urine and medium size clots. Pt states some difficulty urinating. No dizziness, pain 5/10 in bladder.

## 2019-07-01 NOTE — ED Provider Notes (Signed)
[facility] Emergency Department Provider Note  ____________________________________________   First MD Initiated Contact with Patient 07/01/19 [phone]     (approximate)  I have reviewed the triage vital signs and the nursing notes.   HISTORY  Chief Complaint Hematuria    HPI Donald Barry is a 74 y.o. male  S/p recent cystoscopy with fulguration and hydrodistension with Dr. Diamantina Providence on 2/4 here with hematuria. Pt states that overall, he had been recovering well from the procedure. He had some moderate hematuria on Monday which he thought was 2/2 scab falling off, and since then has been urinating w/o difficulty. However, at around 3 AM today, he awoke and passed a moderate amount of dark red urine. At 4 AM, he got the urge to go again and felt passage of clots with again dark bloody urine. He's had ongoing hematuria since then and sensation of incomplete emptying. Denies any dysuria, flank pain. No fever or chills. He has continued amitryptiline and cimetidine. Nothing out of the usual yesterday. No other complaints.       Past Medical History:  Diagnosis Date  . Allergic rhinitis, seasonal 06/14/2014  . Allergy   . Arthritis   . Chronic neck pain 06/14/2014  . Complication of anesthesia   . Gastroesophageal reflux disease without esophagitis 06/14/2014  . Headache    h/o migraines  . Hepatic steatosis 04/05/2018  . Hiatal hernia 09/09/2017  . Intermittent left lower quadrant abdominal pain 07/15/2017  . PONV (postoperative nausea and vomiting)   . Sleep apnea    cpap  . 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    Patient Active Problem List   Diagnosis Date Noted  . Interstitial cystitis 03/04/2019  . Obstructive sleep apnea 12/29/2018  . Thrombocytopenia ([place]) 09/30/2018  . Aortic atherosclerosis ([place]) 07/13/2018  . Hepatic steatosis 04/05/2018  . Hiatal hernia 09/09/2017  . Hx of adenomatous colonic polyps 07/15/2017  .  Intermittent left lower quadrant abdominal pain 07/15/2017  . Allergic rhinitis, seasonal 06/14/2014  . Chronic neck pain 06/14/2014  . Gastroesophageal reflux disease without esophagitis 06/14/2014    Past Surgical History:  Procedure Laterality Date  . BACK SURGERY  2008   L4, L5  . COLONOSCOPY    . CYSTO WITH HYDRODISTENSION N/A 06/17/2019   Procedure: CYSTOSCOPY/HYDRODISTENSION;  Surgeon: Billey Co, MD;  Location: [facility] ORS;  Service: Urology;  Laterality: N/A;  . CYSTOSCOPY WITH BIOPSY N/A 07/17/2018   Procedure: CYSTOSCOPY WITH BLADDER BIOPSY;  Surgeon: Billey Co, MD;  Location: [facility] ORS;  Service: Urology;  Laterality: N/A;  . CYSTOSCOPY WITH BIOPSY N/A 06/17/2019   Procedure: CYSTOSCOPY WITH BLADDER  BIOPSY;  Surgeon: Billey Co, MD;  Location: [facility] ORS;  Service: Urology;  Laterality: N/A;  . CYSTOSCOPY WITH FULGERATION N/A 07/17/2018   Procedure: CYSTOSCOPY WITH FULGERATION;  Surgeon: Billey Co, MD;  Location: [facility] ORS;  Service: Urology;  Laterality: N/A;  . CYSTOSCOPY WITH FULGERATION N/A 06/17/2019   Procedure: CYSTOSCOPY WITH FULGERATION;  Surgeon: Billey Co, MD;  Location: [facility] ORS;  Service: Urology;  Laterality: N/A;  . ESOPHAGOGASTRODUODENOSCOPY    . HERNIA REPAIR  0000000   umbilical  . STOMACH SURGERY     FUNDIC GLAND POLYP    Prior to Admission medications   Medication Sig Start Date End Date Taking? Authorizing Provider  amitriptyline (ELAVIL) 50 MG tablet TAKE 1 TABLET BY MOUTH EVERYDAY AT BEDTIME Patient taking differently: Take 50 mg by mouth at  bedtime.  04/12/19  Yes Billey Co, MD  aspirin EC 81 MG tablet Take 81 mg by mouth daily.    Yes [provider]  cimetidine (TAGAMET) 400 MG tablet Take 400 mg by mouth 2 (two) times daily. Breakfast & supper   Yes [provider]  fluticasone (FLONASE) 50 MCG/ACT nasal spray Place 1 spray into the nose at bedtime.    Yes [provider]  ibuprofen (ADVIL) 200  MG tablet Take 400 mg by mouth daily as needed for moderate pain.    Yes [provider]  loratadine (ALAVERT) 10 MG dissolvable tablet Take 10 mg by mouth daily.    Yes [provider]  Menthol, Topical Analgesic, (BLUE-EMU MAXIMUM STRENGTH EX) Apply 1 application topically 4 (four) times daily as needed (joint pain).    Yes [provider]  Multiple Vitamin (MULTIVITAMIN WITH MINERALS) TABS tablet Take 1 tablet by mouth daily. Iron Free   Yes [provider]  omeprazole (PRILOSEC) 40 MG capsule Take 40 mg by mouth daily before breakfast.    Yes [provider]  Sodium Chloride-Sodium Bicarb (NETI POT SINUS [place] NA) Place 1 Dose into the nose at bedtime as needed (congestion).   Yes [provider]  triamcinolone cream (KENALOG) 0.1 % Apply 1 application topically 2 (two) times daily as needed (affected area(s) of legs).  11/04/18  Yes [provider]  belladonna-opium (B&O SUPPRETTES) 16.2-30 MG suppository Place 1 suppository (30 mg total) rectally every 8 (eight) hours as needed for pain (bladder spasms). 07/01/19   Duffy Bruce, MD  HYDROcodone-acetaminophen (NORCO/VICODIN) 5-325 MG tablet Take 1 tablet by mouth every 6 (six) hours as needed for severe pain. 07/01/19 06/30/20  Duffy Bruce, MD    Allergies Tape, Shellfish allergy, Onion, Other, and Codeine  Family History  Problem Relation Age of Onset  . Hypertension Mother   . GER disease Sister   . GER disease Brother   . Prostate cancer Neg Hx   . Kidney cancer Neg Hx     Social History Social History   Tobacco Use  . Smoking status: Never Smoker  . Smokeless tobacco: Never Used  Substance Use Topics  . Alcohol use: Never  . Drug use: Never    Review of Systems  Review of Systems  Constitutional: Negative for chills and fever.  HENT: Negative for sore throat.   Respiratory: Negative for shortness of breath.   Cardiovascular: Negative for chest pain.    Gastrointestinal: Negative for abdominal pain.  Genitourinary: Positive for decreased urine volume, difficulty urinating, frequency and hematuria. Negative for flank pain.  Musculoskeletal: Negative for neck pain.  Skin: Negative for rash and wound.  Allergic/Immunologic: Negative for immunocompromised state.  Neurological: Negative for weakness and numbness.  Hematological: Does not bruise/bleed easily.     ____________________________________________  PHYSICAL EXAM:      VITAL SIGNS: ED Triage Vitals [07/01/19 0619]  Enc Vitals Group     BP (!) 154/92     Pulse Rate 80     Resp 16     Temp 98.4 F (36.9 C)     Temp Source Oral     SpO2 98 %     Weight 190 lb (86.2 kg)     Height 6' (1.829 m)     Head Circumference      Peak Flow      Pain Score 5     Pain Loc      Pain Edu?  Excl. in [place]?      Physical Exam Vitals and nursing note reviewed.  Constitutional:      General: He is not in acute distress.    Appearance: He is well-developed.  HENT:     Head: Normocephalic and atraumatic.  Eyes:     Conjunctiva/sclera: Conjunctivae normal.  Cardiovascular:     Rate and Rhythm: Normal rate and regular rhythm.     Heart sounds: Normal heart sounds.  Pulmonary:     Effort: Pulmonary effort is normal. No respiratory distress.     Breath sounds: No wheezing.  Abdominal:     General: Abdomen is flat. There is no distension.     Tenderness: There is abdominal tenderness (mild suprapubic).  Musculoskeletal:     Cervical back: Neck supple.  Skin:    General: Skin is warm.     Capillary Refill: Capillary refill takes less than 2 seconds.     Findings: No rash.  Neurological:     Mental Status: He is alert and oriented to person, place, and time.     Motor: No abnormal muscle tone.       ____________________________________________   LABS (all labs ordered are listed, but only abnormal results are displayed)  Labs Reviewed  CBC - Abnormal; Notable for the  following components:      Result Value   Platelets 140 (*)    All other components within normal limits  APTT - Abnormal; Notable for the following components:   aPTT 37 (*)    All other components within normal limits  BASIC METABOLIC PANEL - Abnormal; Notable for the following components:   Glucose, Bld 103 (*)    Calcium 8.6 (*)    All other components within normal limits  URINALYSIS, COMPLETE (UACMP) WITH MICROSCOPIC - Abnormal; Notable for the following components:   Color, Urine RED (*)    APPearance CLOUDY (*)    Glucose, UA   (*)    Value: TEST NOT REPORTED DUE TO COLOR INTERFERENCE OF URINE PIGMENT   Hgb urine dipstick   (*)    Value: TEST NOT REPORTED DUE TO COLOR INTERFERENCE OF URINE PIGMENT   Bilirubin Urine   (*)    Value: TEST NOT REPORTED DUE TO COLOR INTERFERENCE OF URINE PIGMENT   Ketones, ur   (*)    Value: TEST NOT REPORTED DUE TO COLOR INTERFERENCE OF URINE PIGMENT   Protein, ur   (*)    Value: TEST NOT REPORTED DUE TO COLOR INTERFERENCE OF URINE PIGMENT   Nitrite   (*)    Value: TEST NOT REPORTED DUE TO COLOR INTERFERENCE OF URINE PIGMENT   Leukocytes,Ua   (*)    Value: TEST NOT REPORTED DUE TO COLOR INTERFERENCE OF URINE PIGMENT   RBC / HPF >50 (*)    All other components within normal limits  PROTIME-INR    ____________________________________________  EKG: None ________________________________________  RADIOLOGY All imaging, including plain films, CT scans, and ultrasounds, independently reviewed by me, and interpretations confirmed via formal radiology reads.  ED MD interpretation:   None  Official radiology report(s): No results found.  ____________________________________________  PROCEDURES   Procedure(s) performed (including Critical Care):  Procedures  ____________________________________________  INITIAL IMPRESSION / MDM / [place] / ED COURSE  As part of my medical decision making, I reviewed the following data  within the [place] notes reviewed and incorporated, Old chart reviewed, Notes from prior ED visits, and [place] Controlled Substance Database       *  DEYONTA BUNNER was evaluated in Emergency Department on 07/01/2019 for the symptoms described in the history of present illness. He was evaluated in the context of the global COVID-19 pandemic, which necessitated consideration that the patient might be at risk for infection with the SARS-CoV-2 virus that causes COVID-19. Institutional protocols and algorithms that pertain to the evaluation of patients at risk for COVID-19 are in a state of rapid change based on information released by regulatory bodies including the CDC and federal and state organizations. These policies and algorithms were followed during the patient's care in the ED.  Some ED evaluations and interventions may be delayed as a result of limited staffing during the pandemic.*  Clinical Course as of Jun 30 945  Thu Jul 01, 2019  [id number] Discussed with Dr. Diamantina Providence - will check PVR, bladder scan. If <250, pain control and hydration recommended.   [CI]    Clinical Course User Index [CI] Duffy Bruce, MD    Medical Decision Making:  74 yo M here with gross hematuria.  He is status post recent cystoscopy with fulguration and bladder distention.  Suspect shedding of postoperative healing/scabbed.  Discussed with Dr. Caprice Beaver.  Patient was given fluids and monitored with gradual improvement of his hematuria and no evidence of post void residual/retention.  His hemoglobin is stable.  He has no fever or signs of infection.  Will treat with additional analgesics, encourage hydration at home, and outpatient follow-up.  ____________________________________________  FINAL CLINICAL IMPRESSION(S) / ED DIAGNOSES  Final diagnoses:  Gross hematuria     MEDICATIONS GIVEN DURING THIS VISIT:  Medications  sodium chloride 0.9 % bolus 1,000 mL (1,000 mLs Intravenous New  Bag/Given 07/01/19 0804)  phenazopyridine (PYRIDIUM) tablet 200 mg (200 mg Oral Given 07/01/19 0805)  morphine 4 MG/ML injection 4 mg (4 mg Intravenous Given 07/01/19 0805)  ondansetron (ZOFRAN) injection 4 mg (4 mg Intravenous Given 07/01/19 0805)     ED Discharge Orders         Ordered    HYDROcodone-acetaminophen (NORCO/VICODIN) 5-325 MG tablet  Every 6 hours PRN     07/01/19 0940    belladonna-opium (B&O SUPPRETTES) 16.2-30 MG suppository  Every 8 hours PRN     07/01/19 0940           Note:  This document was prepared using Dragon voice recognition software and may include unintentional dictation errors.   Duffy Bruce, MD 07/01/19 [phone]

## 2019-07-01 NOTE — Discharge Instructions (Addendum)
Continue to drink plenty of fluids, at least 6-8 glasses of water daily.  Take the hydrocodone for pain.  You can also use the prescribed suppositories for bladder spasms. Be careful, as taking this with the pain medication can make you drowsy.  You can take OVER THE COUNTER pyridium (Azo) twice daily for 3 days, for bladder pain

## 2019-08-11 ENCOUNTER — Telehealth: Payer: Self-pay

## 2019-08-11 ENCOUNTER — Other Ambulatory Visit: Payer: Self-pay

## 2019-08-11 ENCOUNTER — Ambulatory Visit (INDEPENDENT_AMBULATORY_CARE_PROVIDER_SITE_OTHER): Payer: Medicare HMO | Admitting: Dermatology

## 2019-08-11 DIAGNOSIS — L578 Other skin changes due to chronic exposure to nonionizing radiation: Secondary | ICD-10-CM

## 2019-08-11 DIAGNOSIS — D489 Neoplasm of uncertain behavior, unspecified: Secondary | ICD-10-CM | POA: Diagnosis not present

## 2019-08-11 DIAGNOSIS — L570 Actinic keratosis: Secondary | ICD-10-CM | POA: Diagnosis not present

## 2019-08-11 DIAGNOSIS — L720 Epidermal cyst: Secondary | ICD-10-CM | POA: Diagnosis not present

## 2019-08-11 NOTE — Telephone Encounter (Signed)
Returned patients call and left a message stating that the [facility]-F[facility]U [facility], but has to be [facility] to [facility].

## 2019-08-11 NOTE — Progress Notes (Signed)
   Follow-Up Visit   Subjective  Donald Barry is a 74 y.o. male who presents for the following: Actinic Keratosis (F/U, last had blue light tx on 07/05/19 to scalp).  Patient reports he did well with blue light treatment. He has some scaly spots still there.    The following portions of the chart were reviewed this encounter and updated as appropriate: Tobacco  Allergies  Meds  Problems  Med Hx  Surg Hx  Fam Hx      Review of Systems: No other skin or systemic complaints.  Objective  Well appearing patient in no apparent distress; mood and affect are within normal limits.  A focused examination was performed including upper extremities, including the arms, hands, fingers, and fingernails and head, including the scalp, face, neck, nose, ears, eyelids, and lips. Relevant physical exam findings are noted in the Assessment and Plan.  Objective  Left Ear helix (3), Left earlope (2), Nasal dorsum, Right Ear (2), Right lobe (2), left vertex scalp (4): Erythematous thin papules/macules with gritty scale.Improved, after Blue light tx   Objective  Head - Anterior (Face): Diffuse scaly erythematous macules with underlying dyspigmentation.   Objective  Left Lateral Canthus: Smooth white papules  Objective  Right Elbow - Posterior: 3.5cm subcutaneous fixed thin nodule. No overlying skin change Tender raised lump, right forearm near elbow, present for 6 weeks getting smaller and less tender.  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    Destruction of lesion - Left Ear helix, Left earlope, Nasal dorsum, Right Ear, Right lobe, left vertex scalp  Destruction method: cryotherapy   Informed consent: discussed and consent obtained   Lesion  destroyed using liquid nitrogen: Yes   Outcome: patient tolerated procedure well with no complications   Post-procedure details: wound care instructions given    Actinic skin damage Head - Anterior (Face)  Will start 5-fluorouracil/calcipotriene cream to left brow left forehead area BID for 5 days per instruction sheet  Recommend daily broad spectrum sunscreen SPF 30+ to sun-exposed areas, reapply every 2 hours as needed. Call for new or changing lesions.   [place] Left Lateral Canthus  Benign-appearing.  Observation.  Call clinic for new or changing moles.  Recommend daily use of broad spectrum spf 30+ sunscreen to sun-exposed areas.    Neoplasm of uncertain behavior Right Elbow - Posterior  Possibly related to prior trauma, not skin related, improving Recommend follow up with PCP  Return in about 5 months (around 01/11/2020) for AK follow ukp, TBSE.   I, Donzetta Kohut, CMA, am acting as scribe for [name], MD .  Documentation: I have reviewed the above documentation for accuracy and completeness, and I agree with the above.  [name], MD

## 2019-08-11 NOTE — Patient Instructions (Addendum)
Wound Care Instructions  1. Cleanse wound gently with soap and water once a day then pat dry with clean gauze. Apply a thing coat of Petrolatum (petroleum jelly, "Vaseline") over the wound (unless you have an allergy to this). We recommend that you use a new, sterile tube of Vaseline. Do not pick or remove scabs. Do not remove the yellow or white "healing tissue" from the base of the wound.  2. Cover the wound with fresh, clean, nonstick gauze and secure with paper tape. You may use Band-Aids in place of gauze and tape if the would is small enough, but would recommend trimming much of the tape off as there is often too much. Sometimes Band-Aids can irritate the skin.  3. You should call the office for your biopsy report after 1 week if you have not already been contacted.  4. If you experience any problems, such as abnormal amounts of bleeding, swelling, significant bruising, significant pain, or evidence of infection, please call the office immediately.  5. FOR ADULT SURGERY PATIENTS: If you need something for pain relief you may take 1 extra strength Tylenol (acetaminophen) AND 2 Ibuprofen (200mg  each) together every 4 hours as needed for pain. (do not take these if you are allergic to them or if you have a reason you should not take them.) Typically, you may only need pain medication for 1 to 3 days.      5-Fluorouracil/Calcipotriene Patient Education   Actinic keratoses are the dry, red scaly spots on the skin caused by sun damage. A portion of these spots can turn into skin cancer with time, and treating them can help prevent development of skin cancer.   Treatment of these spots requires removal of the defective skin cells. There are various ways to remove actinic keratoses, including freezing with liquid nitrogen, treatment with creams, or treatment with a blue light procedure in the office.   5-fluorouracil cream is a topical cream used to treat actinic keratoses. It works by interfering  with the growth of abnormal fast-growing skin cells, such as actinic keratoses. These cells peel off and are replaced by healthy ones.   5-fluorouracil/calcipotriene is a combination of the 5-fluorouracil cream with a vitamin D analog cream called calcipotriene. The calcipotriene alone does not treat actinic keratoses. However, when it is combined with 5-fluorouracil, it helps the 5-fluorouracil treat the actinic keratoses much faster so that the same results can be achieved with a much shorter treatment time.  INSTRUCTIONS FOR 5-FLUOROURACIL/CALCIPOTRIENE CREAM:   5-fluorouracil/calcipotriene cream typically only needs to be used for 4-7 days. A thin layer should be applied twice a day to the treatment areas recommended by your physician.   If your physician prescribed you separate tubes of 5-fluourouracil and calcipotriene, apply a thin layer of 5-fluorouracil followed by a thin layer of calcipotriene.   Avoid contact with your eyes, nostrils, and mouth. Do not use 5-fluorouracil/calcipotriene cream on infected or open wounds.   You will develop redness, irritation and some crusting at areas where you have pre-cancer damage/actinic keratoses. IF YOU DEVELOP PAIN, BLEEDING, OR SIGNIFICANT CRUSTING, STOP THE TREATMENT EARLY - you have already gotten a good response and the actinic keratoses should clear up well.  Wash your hands after applying 5-fluorouracil 5% cream on your skin.   A moisturizer or sunscreen with a minimum SPF 30 should be applied each morning.   Once you have finished the treatment, you can apply a thin layer of Vaseline twice a day to irritated areas to  soothe and calm the areas more quickly. If you experience significant discomfort, contact your physician.  For some patients it is necessary to repeat the treatment for best results.  SIDE EFFECTS: When using 5-fluorouracil/calcipotriene cream, you may have mild irritation, such as redness, dryness, swelling, or a mild  burning sensation. This usually resolves within 2 weeks. The more actinic keratoses you have, the more redness and inflammation you can expect during treatment. Eye irritation has been reported rarely. If this occurs, please let us know.  If you have any trouble using this cream, please call the office. If you have any other questions about this information, please do not hesitate to ask me before you leave the office.

## 2019-08-13 ENCOUNTER — Encounter: Payer: Self-pay | Admitting: Dermatology

## 2019-08-16 ENCOUNTER — Other Ambulatory Visit: Payer: Self-pay

## 2019-08-16 MED ORDER — FLUOROURACIL 5 % EX CREA: TOPICAL_CREAM | Freq: Two times a day (BID) | CUTANEOUS | 0 refills | Status: AC

## 2019-08-16 MED ORDER — CALCIPOTRIENE 0.005 % EX CREA: TOPICAL_CREAM | Freq: Two times a day (BID) | CUTANEOUS | 0 refills | Status: AC

## 2019-08-16 NOTE — Progress Notes (Signed)
Patient called and stated that he wanted the 5% Fluorouracil cream [facility] [address] He states that Medicare will pay for these medications. He started a PA for these [id number] and [id number] . Phone number [phone] for Medicare PA.   [facility] [address]

## 2019-08-17 ENCOUNTER — Telehealth: Payer: Self-pay

## 2019-08-17 NOTE — Telephone Encounter (Signed)
Please let patient know. This is why I recommend getting the combined 5-fluorouracil and calcipotriene cream from the [facility] pharmacy for about $55. Insurance does not cover calcipotriene for this condition.   Thank you

## 2019-08-17 NOTE — Telephone Encounter (Signed)
Incoming [facility], Calcipotriene cream not covered by insurance

## 2019-08-18 NOTE — Telephone Encounter (Signed)
Called patient and left message to return call regarding calcipotriene rx not covered.

## 2019-08-19 NOTE — Telephone Encounter (Signed)
Patient advised of information per Dr. Laurence Ferrari. He has agreed to purchase medication through [facility].

## 2019-09-02 DIAGNOSIS — M1711 Unilateral primary osteoarthritis, right knee: Secondary | ICD-10-CM | POA: Insufficient documentation

## 2019-09-02 DIAGNOSIS — M19011 Primary osteoarthritis, right shoulder: Secondary | ICD-10-CM | POA: Insufficient documentation

## 2019-09-28 ENCOUNTER — Telehealth (INDEPENDENT_AMBULATORY_CARE_PROVIDER_SITE_OTHER): Payer: Medicare HMO | Admitting: Urology

## 2019-09-28 ENCOUNTER — Other Ambulatory Visit: Payer: Self-pay

## 2019-09-28 DIAGNOSIS — N3010 Interstitial cystitis (chronic) without hematuria: Secondary | ICD-10-CM

## 2019-09-28 NOTE — Progress Notes (Signed)
Virtual Visit via Telephone Note  I connected with Donald Barry on 09/28/19 at  1:00 PM EDT by telephone and verified that I am speaking with the correct person using two identifiers.   I discussed the limitations, risks, security and privacy concerns of performing an evaluation and management service by telephone and the availability of in person appointments. We discussed the impact of the COVID-19 pandemic on the healthcare system, and the importance of social distancing and reducing patient and provider exposure. I also discussed with the patient that there may be a patient responsible charge related to this service. The patient expressed understanding and agreed to proceed.  Reason for visit: IC  History of Present Illness: I had phone follow-up with Donald Barry today regarding his interstitial cystitis.  He is a 74 year old male who most recently underwent cystoscopy, bladder biopsy, and fulguration in early February 2021 with pathology showing inflamed and ulcerated urothelial mucosa with prominent eosinophilic infiltrate, and chronic inflammation.  He also underwent low pressure low duration hydrodistention at that time.  He reports mild to moderate improvement in his symptoms since that surgery.  He continues on cimetidine and amitriptyline for his interstitial cystitis.  He is not having as much dysuria or pain as previously, but his primary issue is ongoing urinary frequency and nocturia.    He remains bothered by symptoms.  We discussed alternative options at length, and we again reviewed the AUA guidelines regarding the tiered approach to management of interstitial cystitis.  I also recommended considering seeing Dr. [name], MD at [facility], as he specializes in interstitial cystitis.  Other options would be intradetrusor Botox, and we discussed the risks and benefits of this at length.  With his primarily urgency and frequency symptoms, he may benefit from  Botox.  Follow Up: -Referral placed to Dr. [name], MD at [facility] for second opinion on interstitial cystitis -RTC 6 months for symptom check, consider Botox in the future   I discussed the assessment and treatment plan with the patient. The patient was provided an opportunity to ask questions and all were answered. The patient agreed with the plan and demonstrated an understanding of the instructions.   The patient was advised to call back or seek an in-person evaluation if the symptoms worsen or if the condition fails to improve as anticipated.  I provided 13 minutes of non-face-to-face time during this encounter.   Billey Co, MD

## 2019-12-22 ENCOUNTER — Other Ambulatory Visit: Payer: Self-pay

## 2019-12-22 ENCOUNTER — Ambulatory Visit (INDEPENDENT_AMBULATORY_CARE_PROVIDER_SITE_OTHER): Payer: Medicare HMO | Admitting: Dermatology

## 2019-12-22 DIAGNOSIS — D044 Carcinoma in situ of skin of scalp and neck: Secondary | ICD-10-CM | POA: Diagnosis not present

## 2019-12-22 DIAGNOSIS — L570 Actinic keratosis: Secondary | ICD-10-CM

## 2019-12-22 DIAGNOSIS — D229 Melanocytic nevi, unspecified: Secondary | ICD-10-CM

## 2019-12-22 DIAGNOSIS — C4492 Squamous cell carcinoma of skin, unspecified: Secondary | ICD-10-CM

## 2019-12-22 DIAGNOSIS — L821 Other seborrheic keratosis: Secondary | ICD-10-CM

## 2019-12-22 DIAGNOSIS — Z1283 Encounter for screening for malignant neoplasm of skin: Secondary | ICD-10-CM | POA: Diagnosis not present

## 2019-12-22 DIAGNOSIS — L508 Other urticaria: Secondary | ICD-10-CM

## 2019-12-22 DIAGNOSIS — D489 Neoplasm of uncertain behavior, unspecified: Secondary | ICD-10-CM

## 2019-12-22 DIAGNOSIS — L578 Other skin changes due to chronic exposure to nonionizing radiation: Secondary | ICD-10-CM

## 2019-12-22 DIAGNOSIS — L282 Other prurigo: Secondary | ICD-10-CM

## 2019-12-22 DIAGNOSIS — D1800 Hemangioma unspecified site: Secondary | ICD-10-CM

## 2019-12-22 DIAGNOSIS — Z85828 Personal history of other malignant neoplasm of skin: Secondary | ICD-10-CM

## 2019-12-22 DIAGNOSIS — L814 Other melanin hyperpigmentation: Secondary | ICD-10-CM

## 2019-12-22 HISTORY — DX: Squamous cell carcinoma of skin, unspecified: C44.92

## 2019-12-22 NOTE — Progress Notes (Signed)
Follow-Up Visit   Subjective  Donald Barry is a 74 y.o. 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  The following portions of the chart were reviewed this encounter and updated as appropriate:  Tobacco  Allergies  Meds  Problems  Med Hx  Surg Hx  Fam Hx      Review of Systems:  No other skin or systemic complaints except as noted in HPI or Assessment and Plan.  Objective  Well appearing patient in no apparent distress; mood and affect are within normal limits.  A full examination was performed including scalp, head, eyes, ears, nose, lips, neck, chest, axillae, abdomen, back, buttocks, bilateral upper extremities, bilateral lower extremities, hands, feet, fingers, toes, fingernails, and toenails. All findings within normal limits unless otherwise noted below.  Objective  Left Vertex Scalp: 1.3  cm scaly pink plaque  Objective  Left temple and left helix, Right Cheek, Right Earlobe, Right Eyebrow: Erythematous thin papules/macules with gritty scale.   Objective  Mid Frontal Scalp: Diffuse actinic damage  Objective  Right Lower Leg - Anterior: Excoriated erythematous papules   Assessment & Plan  Neoplasm of uncertain behavior Left Vertex Scalp  Skin / nail biopsy Type of biopsy: tangential   Informed consent: discussed and consent obtained   Timeout: patient name, date of birth, surgical site, and procedure verified   Procedure prep:  Patient was prepped and draped in usual sterile fashion Prep type:  Isopropyl alcohol Anesthesia: the lesion was anesthetized in a standard fashion   Anesthetic:  1% lidocaine w/ epinephrine 1-100,000 local infiltration Instrument used: DermaBlade   Hemostasis achieved with: aluminum chloride and electrodesiccation   Outcome: patient tolerated procedure well   Post-procedure details:  sterile dressing applied and wound care instructions given   Dressing type: bandage (mupirocin)    Specimen 1 - Surgical pathology Differential Diagnosis: R/O SCC Check Margins: No 1.3  cm scaly pink plaque  Shave biopsy today  AK (actinic keratosis) (4) Right Earlobe; Right Eyebrow; Left temple and left helix; Right Cheek  Plan Skin Medicinal Chemo-therapeutic cream (Fluorouracil: 5% Calcipotriene: 0.005%) cream after biopsy site on scalp is healed and the lesion is treated if necessarily  Will plan to apply a thin layer twice a day to the Scalp for 7 to 10 days  and Right eyebrow, Right ear, Right cheek, Left cheek for 4 to 7 day  Actinic skin damage Mid Frontal Scalp  Status post PDT treatment x 2  Plan Skin Medicinal Chemo-therapeutic cream (Fluorouracil: 5% Calcipotriene: 0.005%) cream after biopsy site on scalp is healed and the lesion is treated if necessarily  Will plan to apply a thin layer twice a day to the Scalp for 7 to 10 days  and Right eyebrow, Right ear, Right cheek, Left cheek for 4 to 7 day  Papular urticaria Right Lower Leg - Anterior  Start TMC cream to affected area prn, Patient has Eden at home already   Lentigines - Scattered tan macules - Discussed due to sun exposure - Benign, observe - Call for any changes  Seborrheic Keratoses - Stuck-on, waxy, tan-brown papules and plaques  - Discussed benign etiology and prognosis. - Observe - Call for any changes  Melanocytic Nevi - Tan-brown and/or pink-flesh-colored symmetric macules and papules - Benign appearing on exam today - Observation - Call clinic for new or changing  moles - Recommend daily use of broad spectrum spf 30+ sunscreen to sun-exposed areas.   Hemangiomas - Red papules - Discussed benign nature - Observe - Call for any changes  History of Squamous Cell Carcinoma of the Skin - No evidence of recurrence today - No lymphadenopathy - Recommend regular full body skin exams -  Recommend daily broad spectrum sunscreen SPF 30+ to sun-exposed areas, reapply every 2 hours as needed.  - Call if any new or changing lesions are noted between office visits  Skin cancer screening performed today.   Return in about 3 months (around 03/23/2020) for AK Follow up.  IDonzetta Kohut, CMA, am acting as scribe for [name], MD .  Documentation: I have reviewed the above documentation for accuracy and completeness, and I agree with the above.  [name], MD

## 2019-12-22 NOTE — Patient Instructions (Addendum)
Recommend daily broad spectrum sunscreen SPF 30+ to sun-exposed areas, reapply every 2 hours as needed. Call for new or changing lesions.   5-Fluorouracil/Calcipotriene Patient Education   Actinic keratoses are the dry, red scaly spots on the skin caused by sun damage. A portion of these spots can turn into skin cancer with time, and treating them can help prevent development of skin cancer.   Treatment of these spots requires removal of the defective skin cells. There are various ways to remove actinic keratoses, including freezing with liquid nitrogen, treatment with creams, or treatment with a blue light procedure in the office.   5-fluorouracil cream is a topical cream used to treat actinic keratoses. It works by interfering with the growth of abnormal fast-growing skin cells, such as actinic keratoses. These cells peel off and are replaced by healthy ones.   5-fluorouracil/calcipotriene is a combination of the 5-fluorouracil cream with a vitamin D analog cream called calcipotriene. The calcipotriene alone does not treat actinic keratoses. However, when it is combined with 5-fluorouracil, it helps the 5-fluorouracil treat the actinic keratoses much faster so that the same results can be achieved with a much shorter treatment time.  INSTRUCTIONS FOR 5-FLUOROURACIL/CALCIPOTRIENE CREAM:   If your physician prescribed you separate tubes of 5-fluourouracil and calcipotriene, apply a thin layer of 5-fluorouracil followed by a thin layer of calcipotriene.   Avoid contact with your eyes, nostrils, and mouth. Do not use 5-fluorouracil/calcipotriene cream on infected or open wounds.   You will develop redness, irritation and some crusting at areas where you have pre-cancer damage/actinic keratoses. IF YOU DEVELOP PAIN, BLEEDING, OR SIGNIFICANT CRUSTING, STOP THE TREATMENT EARLY - you have already gotten a good response and the actinic keratoses should clear up well.  Wash your hands after applying  5-fluorouracil 5% cream on your skin.   A moisturizer or sunscreen with a minimum SPF 30 should be applied each morning.   Once you have finished the treatment, you can apply a thin layer of Vaseline twice a day to irritated areas to soothe and calm the areas more quickly. If you experience significant discomfort, contact your physician.  For some patients it is necessary to repeat the treatment for best results.  SIDE EFFECTS: When using 5-fluorouracil/calcipotriene cream, you may have mild irritation, such as redness, dryness, swelling, or a mild burning sensation. This usually resolves within 2 weeks. The more actinic keratoses you have, the more redness and inflammation you can expect during treatment. Eye irritation has been reported rarely. If this occurs, please let us know.  If you have any trouble using this cream, please call the office. If you have any other questions about this information, please do not hesitate to ask me before you leave the office.  Marland Kitchen5-Fluorouracil/Calcipotriene Patient Education   Actinic keratoses are the dry, red scaly spots on the skin caused by sun damage. A portion of these spots can turn into skin cancer with time, and treating them can help prevent development of skin cancer.   Treatment of these spots requires removal of the defective skin cells. There are various ways to remove actinic keratoses, including freezing with liquid nitrogen, treatment with creams, or treatment with a blue light procedure in the office.   5-fluorouracil cream is a topical cream used to treat actinic keratoses. It works by interfering with the growth of abnormal fast-growing skin cells, such as actinic keratoses. These cells peel off and are replaced by healthy ones.   5-fluorouracil/calcipotriene is a combination of the 5-fluorouracil  cream with a vitamin D analog cream called calcipotriene. The calcipotriene alone does not treat actinic keratoses. However, when it is combined  with 5-fluorouracil, it helps the 5-fluorouracil treat the actinic keratoses much faster so that the same results can be achieved with a much shorter treatment time.  INSTRUCTIONS FOR 5-FLUOROURACIL/CALCIPOTRIENE CREAM:   Apply a thin layer twice a day to the Scalp for 7 to 10 days  and Right eyebrow, Right ear, Right cheek, Left cheek for 4 to 7 day  5-fluorouracil/calcipotriene cream typically only needs to be used for 4-7 days. A thin layer should be applied twice a day to the treatment areas recommended by your physician.   If your physician prescribed you separate tubes of 5-fluourouracil and calcipotriene, apply a thin layer of 5-fluorouracil followed by a thin layer of calcipotriene.   Avoid contact with your eyes, nostrils, and mouth. Do not use 5-fluorouracil/calcipotriene cream on infected or open wounds.   You will develop redness, irritation and some crusting at areas where you have pre-cancer damage/actinic keratoses. IF YOU DEVELOP PAIN, BLEEDING, OR SIGNIFICANT CRUSTING, STOP THE TREATMENT EARLY - you have already gotten a good response and the actinic keratoses should clear up well.  Wash your hands after applying 5-fluorouracil 5% cream on your skin.   A moisturizer or sunscreen with a minimum SPF 30 should be applied each morning.   Once you have finished the treatment, you can apply a thin layer of Vaseline twice a day to irritated areas to soothe and calm the areas more quickly. If you experience significant discomfort, contact your physician.  For some patients it is necessary to repeat the treatment for best results.  SIDE EFFECTS: When using 5-fluorouracil/calcipotriene cream, you may have mild irritation, such as redness, dryness, swelling, or a mild burning sensation. This usually resolves within 2 weeks. The more actinic keratoses you have, the more redness and inflammation you can expect during treatment. Eye irritation has been reported rarely. If this occurs, please  let us know.  If you have any trouble using this cream, please call the office. If you have any other questions about this information, please do not hesitate to ask me before you leave the office.

## 2019-12-22 NOTE — Progress Notes (Signed)
08/18/1945 

## 2019-12-29 ENCOUNTER — Encounter: Payer: Self-pay | Admitting: Dermatology

## 2019-12-29 NOTE — Progress Notes (Signed)
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 He prefers Mohs surgery. Will refer to [name] at [facility]. He will not use 5-Fu/calcipotriene to treat actinic keratoses of the scalp until he gets approval from [name]

## 2019-12-31 ENCOUNTER — Other Ambulatory Visit: Payer: Self-pay | Admitting: Urology

## 2020-01-11 ENCOUNTER — Other Ambulatory Visit: Payer: Self-pay

## 2020-01-11 DIAGNOSIS — D044 Carcinoma in situ of skin of scalp and neck: Secondary | ICD-10-CM

## 2020-01-11 NOTE — Progress Notes (Signed)
Referral to [name] for [facility].

## 2020-03-23 ENCOUNTER — Other Ambulatory Visit: Payer: Self-pay

## 2020-03-23 ENCOUNTER — Ambulatory Visit (INDEPENDENT_AMBULATORY_CARE_PROVIDER_SITE_OTHER): Payer: Medicare HMO | Admitting: Dermatology

## 2020-03-23 DIAGNOSIS — R21 Rash and other nonspecific skin eruption: Secondary | ICD-10-CM

## 2020-03-23 DIAGNOSIS — T148XXA Other injury of unspecified body region, initial encounter: Secondary | ICD-10-CM

## 2020-03-23 DIAGNOSIS — L570 Actinic keratosis: Secondary | ICD-10-CM

## 2020-03-23 DIAGNOSIS — L304 Erythema intertrigo: Secondary | ICD-10-CM

## 2020-03-23 DIAGNOSIS — L578 Other skin changes due to chronic exposure to nonionizing radiation: Secondary | ICD-10-CM

## 2020-03-23 DIAGNOSIS — S01301A Unspecified open wound of right ear, initial encounter: Secondary | ICD-10-CM

## 2020-03-23 DIAGNOSIS — Z86007 Personal history of in-situ neoplasm of skin: Secondary | ICD-10-CM | POA: Diagnosis not present

## 2020-03-23 MED ORDER — MUPIROCIN 2 % EX OINT: 1.0000 "application " | TOPICAL_OINTMENT | Freq: Every day | CUTANEOUS | 1 refills | Status: DC

## 2020-03-23 MED ORDER — HYDROCORTISONE 2.5 % EX CREA: TOPICAL_CREAM | Freq: Two times a day (BID) | CUTANEOUS | 1 refills | Status: DC

## 2020-03-23 NOTE — Progress Notes (Signed)
Follow-Up Visit   Subjective  Donald Barry is a 74 y.o. male who presents for the following: Follow-up (Patient here today for 3 month AK follow up. Areas treated at last visit with Skin[facility] 5FU/calcipotriene at R earlobe, R eyebrow, L temple, L helix and R cheek. ).  Patient does advise there is a spot at his left ear, one at right ear "crease" that sometimes bleeds and he has had a rash at underarms that sometimes itches for over 1 month.  Patient had biopsy proven SCCis at left vertex scalp and was sent for Moh's with [name]. When he saw [name] he did not see any remaining cancer to excise so recommended he treat with Effudex which he started this past Monday.   The following portions of the chart were reviewed this encounter and updated as appropriate:  Tobacco  Allergies  Meds  Problems  Med Hx  Surg Hx  Fam Hx      Review of Systems:  No other skin or systemic complaints except as noted in HPI or Assessment and Plan.  Objective  Well appearing patient in no apparent distress; mood and affect are within normal limits.  A focused examination was performed including scalp, face. Relevant physical exam findings are noted in the Assessment and Plan.  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    Topical steroids (such as triamcinolone, fluocinolone, fluocinonide, mometasone, clobetasol, halobetasol, betamethasone, hydrocortisone) can cause thinning and lightening of the skin if they are used for too long in the same area. Your physician has selected the right strength medicine for your problem and area affected on the body. Please use your medication only as directed by your physician to prevent side effects.    hydrocortisone 2.5 % cream - Right Axilla  Actinic Damage - Severe, chronic, secondary to cumulative UV radiation exposure over time - diffuse scaly erythematous macules and papules with underlying dyspigmentation - Discussed "Field Treatment" for Severe, Confluent Actinic Changes with Pre-Cancerous Actinic Keratoses Field treatment involves treatment of an entire area of skin that has confluent Actinic Changes (Sun/ Ultraviolet light damage) and PreCancerous Actinic Keratoses by method of PhotoDynamic Therapy (PDT) and/or prescription Topical Chemotherapy agents such as 5-fluorouracil, 5-fluorouracil/calcipotriene, and/or imiquimod.  The purpose is to decrease the number of clinically evident and subclinical PreCancerous lesions to prevent progression to development of skin cancer by chemically destroying early precancer changes that may or may not be visible.  It has been shown to reduce the risk of developing skin cancer in the treated area. As a result of treatment, redness, scaling, crusting, and open sores may occur during treatment course. One or more than one of these methods may be used and may have to be used several times to control, suppress and eliminate the PreCancerous changes. Discussed treatment course, expected reaction, and possible  side effects. - Recommend daily broad spectrum sunscreen SPF 30+ to sun-exposed areas, reapply every 2 hours as needed.  - Call for new or changing lesions. - Start Skin Medicinals (prescription from compounding pharmacy)  5-Fluorouracil/Calcipotriene cream twice daily to entire face and left ear for 4 days as tolerated.  Handout with treatment instructions and expected reaction provided today. - Patient will start in January.   Return in about 2 months (around 05/30/2020) for AK follow up.  Graciella Belton, RMA, am acting as scribe for [name], MD .  Documentation: I have reviewed the above documentation for accuracy and completeness, and I agree with the above.  [name], MD

## 2020-03-23 NOTE — Patient Instructions (Addendum)
5-Fluorouracil/Calcipotriene Patient Education   Actinic keratoses are the dry, red scaly spots on the skin caused by sun damage. A portion of these spots can turn into skin cancer with time, and treating them can help prevent development of skin cancer.   Treatment of these spots requires removal of the defective skin cells. There are various ways to remove actinic keratoses, including freezing with liquid nitrogen, treatment with creams, or treatment with a blue light procedure in the office.   5-fluorouracil cream is a topical cream used to treat actinic keratoses. It works by interfering with the growth of abnormal fast-growing skin cells, such as actinic keratoses. These cells peel off and are replaced by healthy ones.   5-fluorouracil/calcipotriene is a combination of the 5-fluorouracil cream with a vitamin D analog cream called calcipotriene. The calcipotriene alone does not treat actinic keratoses. However, when it is combined with 5-fluorouracil, it helps the 5-fluorouracil treat the actinic keratoses much faster so that the same results can be achieved with a much shorter treatment time.  INSTRUCTIONS FOR 5-FLUOROURACIL/CALCIPOTRIENE CREAM:   5-fluorouracil/calcipotriene cream typically only needs to be used for 4-7 days. A thin layer should be applied twice a day to the treatment areas recommended by your physician.   If your physician prescribed you separate tubes of 5-fluourouracil and calcipotriene, apply a thin layer of 5-fluorouracil followed by a thin layer of calcipotriene.   Avoid contact with your eyes, nostrils, and mouth. Do not use 5-fluorouracil/calcipotriene cream on infected or open wounds.   You will develop redness, irritation and some crusting at areas where you have pre-cancer damage/actinic keratoses. IF YOU DEVELOP PAIN, BLEEDING, OR SIGNIFICANT CRUSTING, STOP THE TREATMENT EARLY - you have already gotten a good response and the actinic keratoses should clear up  well.  Wash your hands after applying 5-fluorouracil 5% cream on your skin.   A moisturizer or sunscreen with a minimum SPF 30 should be applied each morning.   Once you have finished the treatment, you can apply a thin layer of Vaseline twice a day to irritated areas to soothe and calm the areas more quickly. If you experience significant discomfort, contact your physician.  For some patients it is necessary to repeat the treatment for best results.  SIDE EFFECTS: When using 5-fluorouracil/calcipotriene cream, you may have mild irritation, such as redness, dryness, swelling, or a mild burning sensation. This usually resolves within 2 weeks. The more actinic keratoses you have, the more redness and inflammation you can expect during treatment. Eye irritation has been reported rarely. If this occurs, please let us know.  If you have any trouble using this cream, please call the office. If you have any other questions about this information, please do not hesitate to ask me before you leave the office.  Apply 5FU/calcipotriene twice daily to face for 4 days and to scalp and ears for 7 days.

## 2020-03-31 ENCOUNTER — Other Ambulatory Visit: Payer: Self-pay | Admitting: Gastroenterology

## 2020-03-31 DIAGNOSIS — K760 Fatty (change of) liver, not elsewhere classified: Secondary | ICD-10-CM

## 2020-04-05 ENCOUNTER — Ambulatory Visit
Admission: RE | Admit: 2020-04-05 | Discharge: 2020-04-05 | Disposition: A | Discharge status: Home / Self Care | Payer: Medicare HMO | Source: Ambulatory Visit | Attending: Gastroenterology | Admitting: Gastroenterology

## 2020-04-05 ENCOUNTER — Other Ambulatory Visit: Payer: Self-pay

## 2020-04-05 ENCOUNTER — Ambulatory Visit: Payer: Medicare HMO | Admitting: Urology

## 2020-04-05 DIAGNOSIS — K760 Fatty (change of) liver, not elsewhere classified: Secondary | ICD-10-CM | POA: Insufficient documentation

## 2020-04-09 ENCOUNTER — Encounter: Payer: Self-pay | Admitting: Dermatology

## 2020-04-12 ENCOUNTER — Other Ambulatory Visit: Payer: Self-pay | Admitting: *Deleted

## 2020-04-12 DIAGNOSIS — K760 Fatty (change of) liver, not elsewhere classified: Secondary | ICD-10-CM

## 2020-04-14 ENCOUNTER — Other Ambulatory Visit: Payer: Medicare HMO

## 2020-04-21 ENCOUNTER — Ambulatory Visit: Payer: Medicare HMO | Admitting: Oncology

## 2020-05-15 ENCOUNTER — Inpatient Hospital Stay: Payer: Medicare HMO | Attending: Oncology

## 2020-05-15 DIAGNOSIS — D472 Monoclonal gammopathy: Secondary | ICD-10-CM | POA: Diagnosis not present

## 2020-05-15 DIAGNOSIS — K219 Gastro-esophageal reflux disease without esophagitis: Secondary | ICD-10-CM | POA: Diagnosis not present

## 2020-05-15 DIAGNOSIS — R768 Other specified abnormal immunological findings in serum: Secondary | ICD-10-CM | POA: Insufficient documentation

## 2020-05-15 DIAGNOSIS — Z85820 Personal history of malignant melanoma of skin: Secondary | ICD-10-CM | POA: Diagnosis not present

## 2020-05-15 DIAGNOSIS — E8889 Other specified metabolic disorders: Secondary | ICD-10-CM | POA: Diagnosis not present

## 2020-05-15 DIAGNOSIS — M129 Arthropathy, unspecified: Secondary | ICD-10-CM | POA: Diagnosis not present

## 2020-05-15 DIAGNOSIS — Z7982 Long term (current) use of aspirin: Secondary | ICD-10-CM | POA: Insufficient documentation

## 2020-05-15 DIAGNOSIS — Z79899 Other long term (current) drug therapy: Secondary | ICD-10-CM | POA: Insufficient documentation

## 2020-05-15 DIAGNOSIS — K760 Fatty (change of) liver, not elsewhere classified: Secondary | ICD-10-CM

## 2020-05-15 DIAGNOSIS — R350 Frequency of micturition: Secondary | ICD-10-CM | POA: Diagnosis not present

## 2020-05-15 DIAGNOSIS — G4730 Sleep apnea, unspecified: Secondary | ICD-10-CM | POA: Diagnosis not present

## 2020-05-15 LAB — CBC WITH DIFFERENTIAL/PLATELET
Abs Immature Granulocytes: 0.02 10*3/uL (ref 0.00–0.07)
Basophils Absolute: 0 10*3/uL (ref 0.0–0.1)
Basophils Relative: 0 %
Eosinophils Absolute: 0.1 10*3/uL (ref 0.0–0.5)
Eosinophils Relative: 2 %
HCT: 42.5 % (ref 39.0–52.0)
Hemoglobin: 14.9 g/dL (ref 13.0–17.0)
Immature Granulocytes: 0 %
Lymphocytes Relative: 17 %
Lymphs Abs: 0.9 10*3/uL (ref 0.7–4.0)
MCH: 30.9 pg (ref 26.0–34.0)
MCHC: 35.1 g/dL (ref 30.0–36.0)
MCV: 88.2 fL (ref 80.0–100.0)
Monocytes Absolute: 0.6 10*3/uL (ref 0.1–1.0)
Monocytes Relative: 11 %
Neutro Abs: 3.6 10*3/uL (ref 1.7–7.7)
Neutrophils Relative %: 70 %
Platelets: 175 10*3/uL (ref 150–400)
RBC: 4.82 MIL/uL (ref 4.22–5.81)
RDW: 13 % (ref 11.5–15.5)
WBC: 5.1 10*3/uL (ref 4.0–10.5)
nRBC: 0 % (ref 0.0–0.2)

## 2020-05-15 LAB — COMPREHENSIVE METABOLIC PANEL
ALT: 23 U/L (ref 0–44)
AST: 26 U/L (ref 15–41)
Albumin: 4 g/dL (ref 3.5–5.0)
Alkaline Phosphatase: 36 U/L — ABNORMAL LOW (ref 38–126)
Anion gap: 11 (ref 5–15)
BUN: 18 mg/dL (ref 8–23)
CO2: 24 mmol/L (ref 22–32)
Calcium: 8.9 mg/dL (ref 8.9–10.3)
Chloride: 104 mmol/L (ref 98–111)
Creatinine, Ser: 0.93 mg/dL (ref 0.61–1.24)
GFR, Estimated: >60 mL/min (ref >60)
Glucose, Bld: 103 mg/dL — ABNORMAL HIGH (ref 70–99)
Potassium: 4.3 mmol/L (ref 3.5–5.1)
Sodium: 139 mmol/L (ref 135–145)
Total Bilirubin: 0.4 mg/dL (ref 0.3–1.2)
Total Protein: 6.9 g/dL (ref 6.5–8.1)

## 2020-05-15 LAB — PROTIME-INR
INR: 1.1 (ref 0.8–1.2)
Prothrombin Time: 13.3 seconds (ref 11.4–15.2)

## 2020-05-16 LAB — KAPPA/LAMBDA LIGHT CHAINS
Kappa free light chain: 28.2 mg/L — ABNORMAL HIGH (ref 3.3–19.4)
Kappa, lambda light chain ratio: 1.75 — ABNORMAL HIGH (ref 0.26–1.65)
Lambda free light chains: 16.1 mg/L (ref 5.7–26.3)

## 2020-05-16 IMAGING — CT CT ABD-PEL WO/W CM
3 of 12 series · 11 of 46 positions shown, 17 images · IV contrast (omnipaque)
Comparison: 09/19/2017

CLINICAL DATA: Worsening urinary frequency.  Microscopic hematuria.

EXAM:
CT ABDOMEN AND PELVIS WITHOUT AND WITH CONTRAST
TECHNIQUE: Multidetector CT imaging of the abdomen and pelvis was performed
following the standard protocol before and following the bolus
administration of intravenous contrast.
CONTRAST:  125mL OMNIPAQUE IOHEXOL 300 MG/ML  SOLN

[Series 2: without pre · axial · non-contrast · 0.75mm/px · z∈[-1635,-1570]mm · 2 of 95 slices shown]
[im 14/95  soft-tissue]
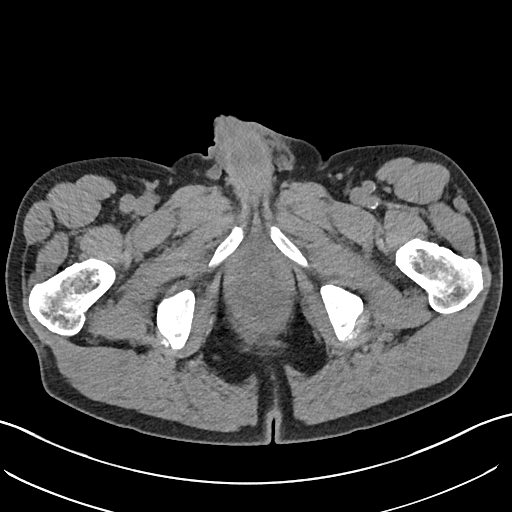
[im 27/95  soft-tissue]
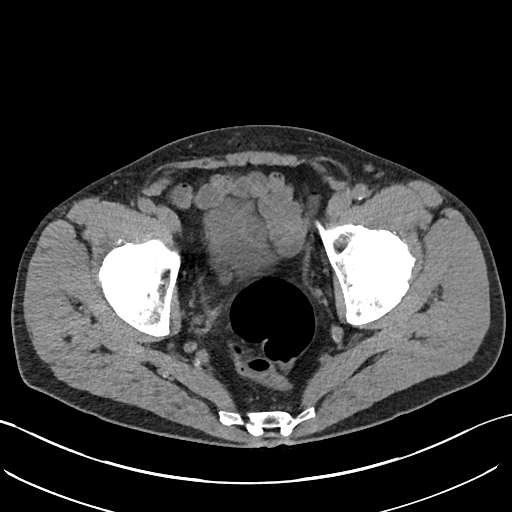

[Series 5: cor without without pre · coronal · non-contrast · 0.75mm/px · 2 of 144 slices shown, 3 images]
[im 48/144  soft-tissue]
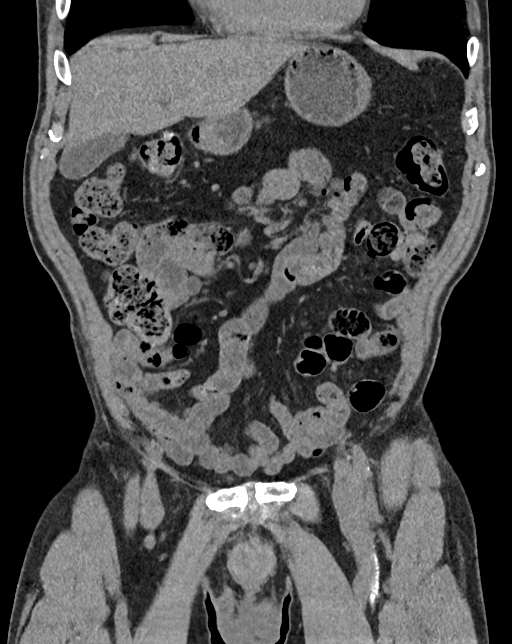
[im 48/144  bone]
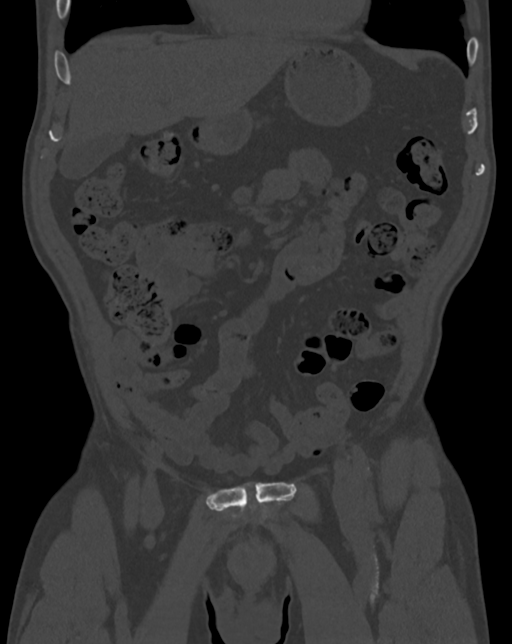
[im 96/144  soft-tissue]
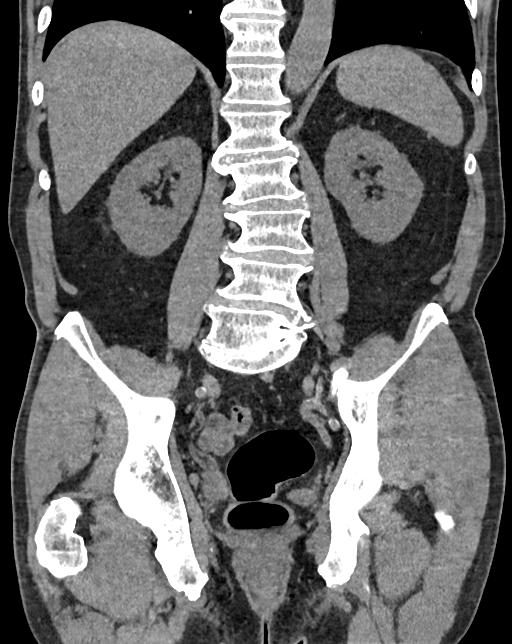

[Series 17: axial delay delay prone · axial · delayed · 0.75mm/px · z∈[-1651,-1291]mm · 7 of 97 slices shown, 12 images]
[im 13/97  soft-tissue]
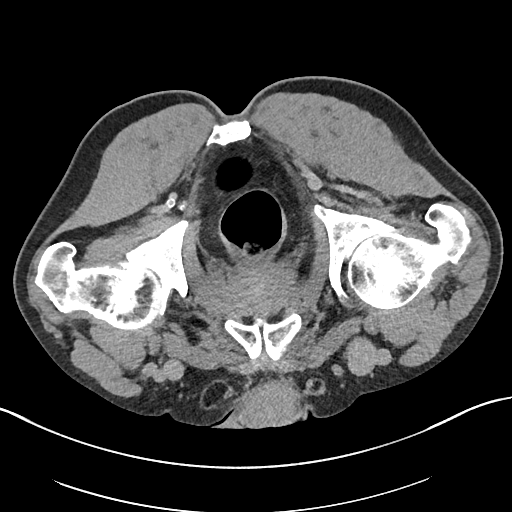
[im 13/97  bone]
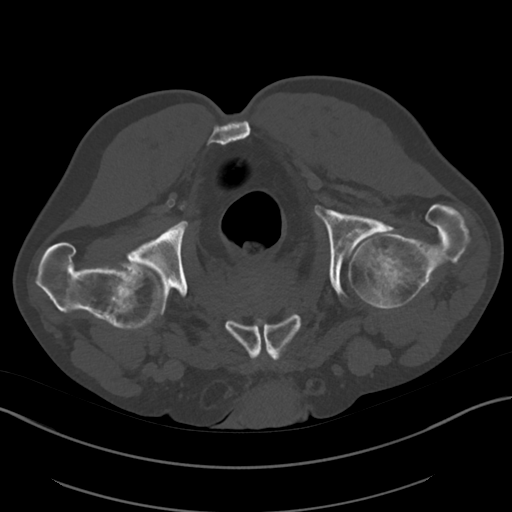
[im 25/97  soft-tissue]
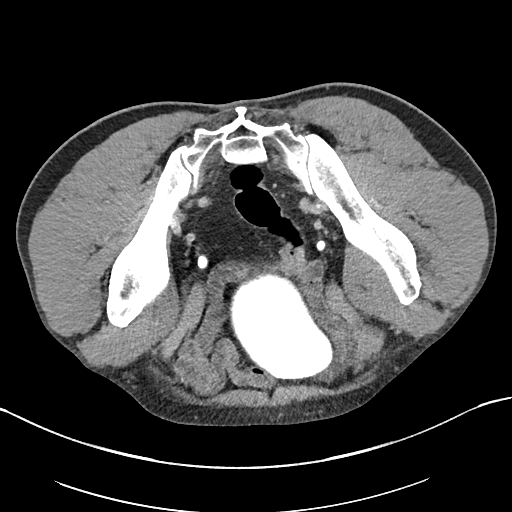
[im 37/97  soft-tissue]
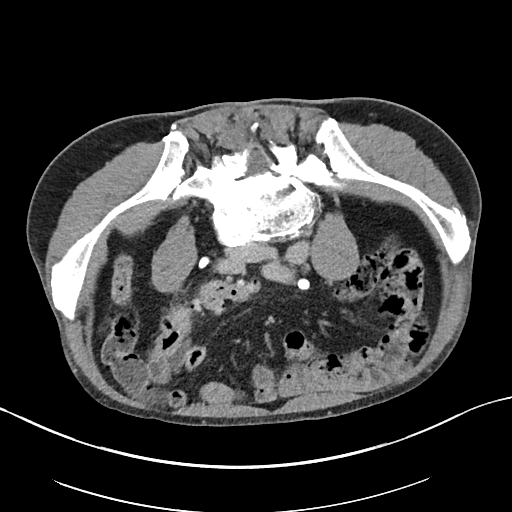
[im 49/97  soft-tissue]
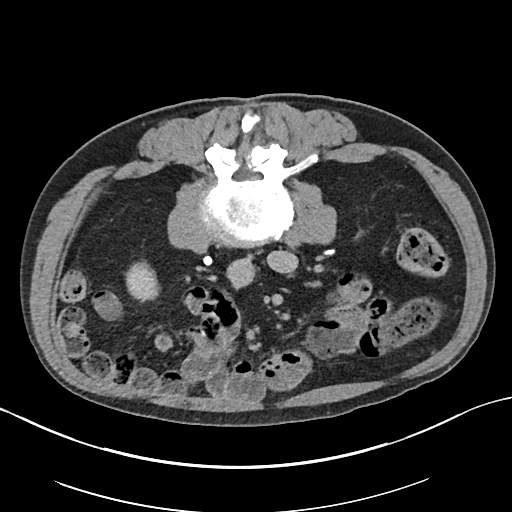
[im 49/97  lung]
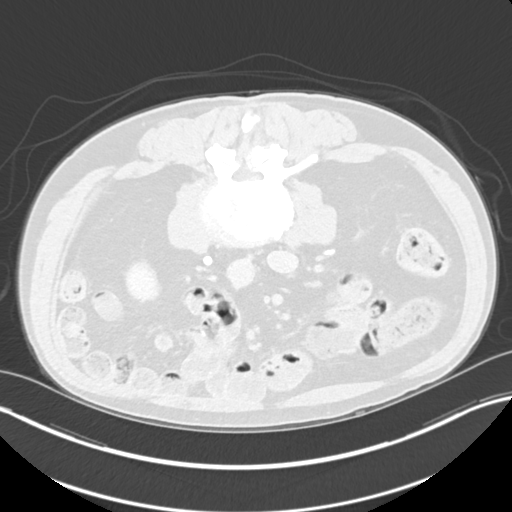
[im 61/97  soft-tissue]
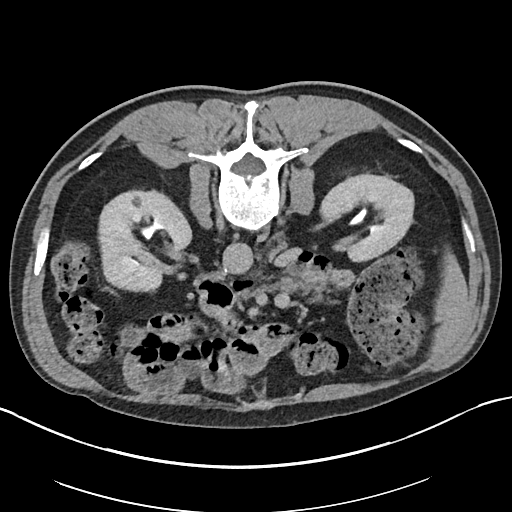
[im 61/97  lung]
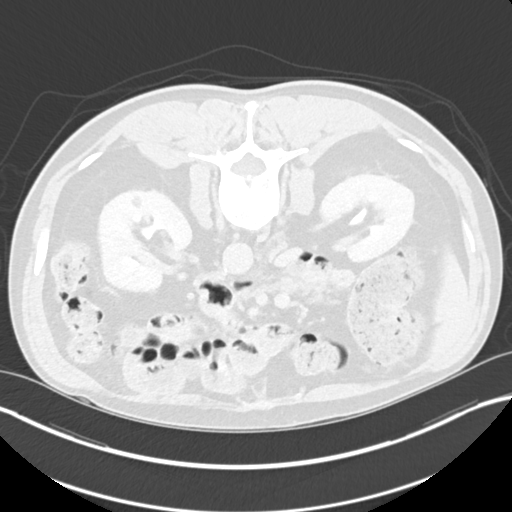
[im 73/97  soft-tissue]
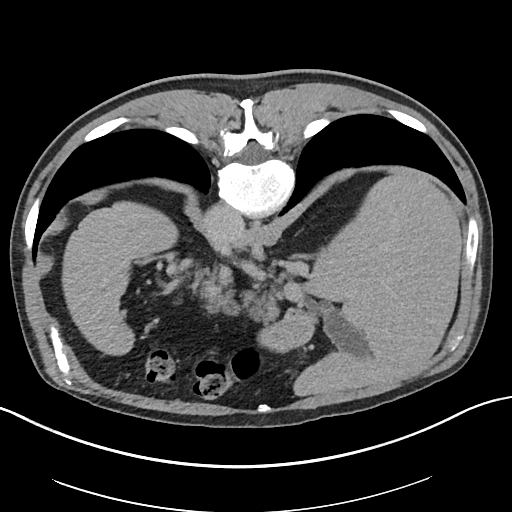
[im 73/97  lung]
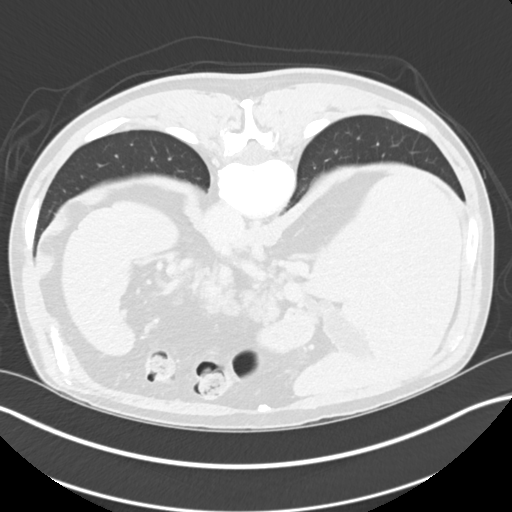
[im 85/97  soft-tissue]
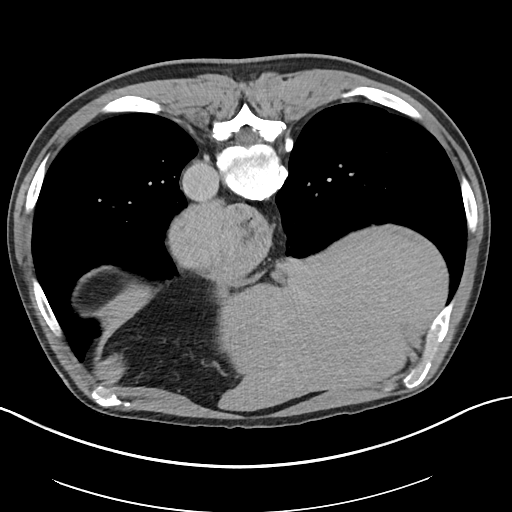
[im 85/97  lung]
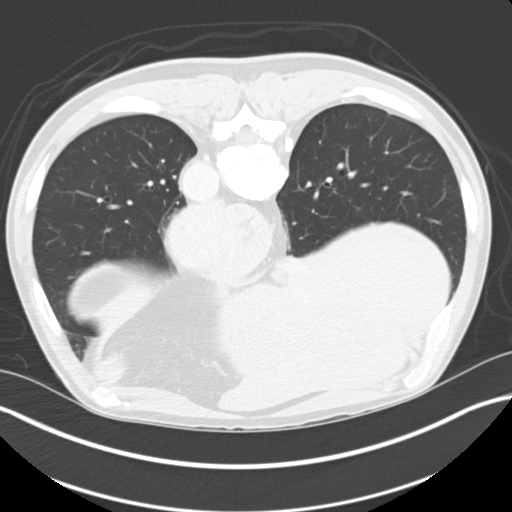

[11 of 46 positions shown; findings below may reference images not displayed]

FINDINGS: Lower chest: Moderate to large hiatal hernia.

Hepatobiliary: No suspicious focal abnormality within the liver
parenchyma. 4 mm hypodensity in the inferior right liver is too
small to characterize but likely benign. 11 mm gallstone evident. No
intrahepatic or extrahepatic biliary dilation.

Pancreas: No focal mass lesion. No dilatation of the main duct. No
intraparenchymal cyst. No peripancreatic edema.

Spleen: No splenomegaly. No focal mass lesion.

Adrenals/Urinary Tract: No adrenal nodule or mass.

Precontrast imaging shows no stones in either kidney or ureter. No
bladder stones.

Imaging after IV contrast administration reveals tiny hypodensities
in the cortex of each kidney, the largest of which is in the upper
upper pole left kidney measuring 11 mm and stable since prior. No
suspicious renal abnormality.

Delayed imaging shows no wall thickening or soft tissue filling
defect in either intrarenal collecting system or renal pelvis. Both
ureters are well opacified and have normal CT imaging features. Mild
bladder wall trabeculation evident without focal bladder wall
abnormality.

Stomach/Bowel: Moderate to large hiatal hernia noted. Stomach
otherwise unremarkable. Duodenum is normally positioned as is the
ligament of Treitz. Multiple duodenal diverticuli evident. No small
bowel wall thickening. No small bowel dilatation. The terminal ileum
is normal. The appendix is normal. No gross colonic mass. No colonic
wall thickening. Diverticular changes are noted in the left colon
without evidence of diverticulitis.

Vascular/Lymphatic: There is abdominal aortic atherosclerosis
without aneurysm. There is no gastrohepatic or hepatoduodenal
ligament lymphadenopathy. No intraperitoneal or retroperitoneal
lymphadenopathy. No pelvic sidewall lymphadenopathy.

Reproductive: The prostate gland and seminal vesicles are
unremarkable.

Other: No intraperitoneal free fluid.

Musculoskeletal: No worrisome lytic or sclerotic osseous
abnormality.
IMPRESSION: 1. No acute findings in the abdomen or pelvis.
2. No findings to explain the patient's history of urinary frequency
and microhematuria.
3. Moderate to large hiatal hernia.
4.  Aortic Atherosclerois ([id number]-[id number])
5. Cholelithiasis.

## 2020-05-17 LAB — MULTIPLE MYELOMA PANEL, SERUM
Albumin SerPl Elph-Mcnc: 3.6 g/dL (ref 2.9–4.4)
Albumin/Glob SerPl: 1.3 (ref 0.7–1.7)
Alpha 1: 0.2 g/dL (ref 0.0–0.4)
Alpha2 Glob SerPl Elph-Mcnc: 0.8 g/dL (ref 0.4–1.0)
B-Globulin SerPl Elph-Mcnc: 1 g/dL (ref 0.7–1.3)
Gamma Glob SerPl Elph-Mcnc: 0.7 g/dL (ref 0.4–1.8)
Globulin, Total: 2.8 g/dL (ref 2.2–3.9)
IgA: 116 mg/dL (ref 61–437)
IgG (Immunoglobin G), Serum: 648 mg/dL (ref 603–1613)
IgM (Immunoglobulin M), Srm: 185 mg/dL — ABNORMAL HIGH (ref 15–143)
M Protein SerPl Elph-Mcnc: 0.1 g/dL — ABNORMAL HIGH
Total Protein ELP: 6.4 g/dL (ref 6.0–8.5)

## 2020-05-22 ENCOUNTER — Inpatient Hospital Stay (HOSPITAL_BASED_OUTPATIENT_CLINIC_OR_DEPARTMENT_OTHER): Payer: Medicare HMO | Admitting: Oncology

## 2020-05-22 ENCOUNTER — Encounter: Payer: Self-pay | Admitting: Oncology

## 2020-05-22 VITALS — BP 128/88 | HR 90 | Temp 97.9°F | Resp 20 | Wt 206.9 lb

## 2020-05-22 DIAGNOSIS — D472 Monoclonal gammopathy: Secondary | ICD-10-CM | POA: Diagnosis not present

## 2020-05-22 NOTE — Progress Notes (Signed)
Hematology/Oncology Consult note [facility]  [phone] [phone]  Patient Care Team: Derinda Late, MD as PCP - General (Family Medicine) Sindy Guadeloupe, MD as Consulting Physician (Hematology and Oncology)   Name of the patient: Donald Barry  [id number]  [birth year]   Date of visit: 05/22/20  Diagnosis-IgM MGUS  Chief complaint/ Reason for visit- routine follow-up of MGUS  Heme/Onc history: patient is a 75 year old male with a past medical history of fatty liver and GERD who was seen by pulmonology clinic GI for symptoms of abdominal pain. As a part of that work-up patient had SPEP done Which showed mildly elevated IgM levels of 202. IgM monoclonal kappa light chain was noted on immunofixation 0.2 g. Hence the patient has been referred to [place]. Of note patient had a normal CBC with a white count of 4.1, H&H of 14.8/43.2 in February 2020. His platelet counts have always been mildly low in the 120s. CMP was unremarkable. Iron studies were normal. Patient also had CT of the abdomen done as a part of microscopic hematuria in February 2020 which did not reveal any evidence of splenomegaly  Results of blood work from 09/24/2018 were as follows: CBC showed white count of 4.7, H&H of 14.7/43.1. Count of 147. CMP was within normal limits. Multiple myeloma panel showed M protein IgM kappa 0.3 g. Serum free light chains revealed a mildly elevated kappa free light chain 21.6 with a kappa lambda light chain ratio of 1.86.  Interval history-patient has problems with frequent urination for which she follows up with urology at [facility].  He is otherwise active for his age but unable to get more physical activity done such as bicycling due to problems with frequent urination.  Denies any new aches and pains anywhere  ECOG PS- 1 Pain scale- 0   Review of systems- Review of Systems  Constitutional: Positive for malaise/fatigue. Negative  for chills, fever and weight loss.  HENT: Negative for congestion, ear discharge and nosebleeds.   Eyes: Negative for blurred vision.  Respiratory: Negative for cough, hemoptysis, sputum production, shortness of breath and wheezing.   Cardiovascular: Negative for chest pain, palpitations, orthopnea and claudication.  Gastrointestinal: Negative for abdominal pain, blood in stool, constipation, diarrhea, heartburn, melena, nausea and vomiting.  Genitourinary: Negative for dysuria, flank pain, frequency, hematuria and urgency.       Frequent urination  Musculoskeletal: Negative for back pain, joint pain and myalgias.  Skin: Negative for rash.  Neurological: Negative for dizziness, tingling, focal weakness, seizures, weakness and headaches.  Endo/Heme/Allergies: Does not bruise/bleed easily.  Psychiatric/Behavioral: Negative for depression and suicidal ideas. The patient does not have insomnia.      Allergies  Allergen Reactions  . Tape Dermatitis    Skin blistered- steri strip   . Wound Dressing Adhesive Dermatitis    Skin blistered- steri strip  . Shellfish Allergy Diarrhea and Nausea And Vomiting    Shrimp   . Shellfish-Derived Products Diarrhea, Nausea And Vomiting and Nausea Only  . Onion     diarrhea  . Other Nausea And Vomiting    general anesthesia  . Codeine Nausea Only     Past Medical History:  Diagnosis Date  . Actinic keratosis    Hx of PDT Tx., face 1/252021 and scalp 07/05/2019  . Allergic rhinitis, seasonal 06/14/2014  . Allergy   . Arthritis   . Chronic neck pain 06/14/2014  . Complication of anesthesia   . Gastroesophageal reflux disease without esophagitis  06/14/2014  . Headache    h/o migraines  . Hepatic steatosis 04/05/2018  . Hiatal hernia 09/09/2017  . Intermittent left lower quadrant abdominal pain 07/15/2017  . PONV (postoperative nausea and vomiting)   . Sleep apnea    cpap  . 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 SCCis  . 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     Past Surgical History:  Procedure Laterality Date  . BACK SURGERY  2008   L4, L5  . COLONOSCOPY    . CYSTO WITH HYDRODISTENSION N/A 06/17/2019   Procedure: CYSTOSCOPY/HYDRODISTENSION;  Surgeon: Billey Co, MD;  Location: [facility] ORS;  Service: Urology;  Laterality: N/A;  . CYSTOSCOPY WITH BIOPSY N/A 07/17/2018   Procedure: CYSTOSCOPY WITH BLADDER BIOPSY;  Surgeon: Billey Co, MD;  Location: [facility] ORS;  Service: Urology;  Laterality: N/A;  . CYSTOSCOPY WITH BIOPSY N/A 06/17/2019   Procedure: CYSTOSCOPY WITH BLADDER  BIOPSY;  Surgeon: Billey Co, MD;  Location: [facility] ORS;  Service: Urology;  Laterality: N/A;  . CYSTOSCOPY WITH FULGERATION N/A 07/17/2018   Procedure: CYSTOSCOPY WITH FULGERATION;  Surgeon: Billey Co, MD;  Location: [facility] OR[facility];  Service: Urology;  Laterality: N/A;  . CYSTOSCOPY WITH FULGERATION N/A 06/17/2019   Procedure: CYSTOSCOPY WITH FULGERATION;  Surgeon: Billey Co, MD;  Location: [facility] ORS;  Service: Urology;  Laterality: N/A;  . ESOPHAGOGASTRODUODENOSCOPY    . HERNIA REPAIR  2263   umbilical  . STOMACH SURGERY     FUNDIC GLAND POLYP    Social History   Socioeconomic History  . Marital status: Married    Spouse name: Not on file  . Number of children: Not on file  . Years of education: Not on file  . Highest education level: Not on file  Occupational History  . Not on file  Tobacco Use  . Smoking status: Never Smoker  . Smokeless tobacco: Never Used  Vaping Use  . Vaping Use: Never used  Substance and Sexual Activity  . Alcohol use: Never  . Drug use: Never  . Sexual activity: Yes    Birth control/protection: None  Other Topics Concern  . Not on file  Social History Narrative  . Not on file   Social Determinants of Health   Financial Resource Strain: Not on file  Food Insecurity: Not on file   Transportation Needs: Not on file  Physical Activity: Not on file  Stress: Not on file  Social Connections: Not on file  Intimate Partner Violence: Not on file    Family History  Problem Relation Age of Onset  . Hypertension Mother   . GER disease Sister   . GER disease Brother   . Prostate cancer Neg Hx   . Kidney cancer Neg Hx      Current Outpatient Medications:  .  amitriptyline (ELAVIL) 50 MG tablet, Take 1 tablet (50 mg total) by mouth at bedtime., Disp: 90 tablet, Rfl: 1 .  aspirin EC 81 MG tablet, Take 81 mg by mouth daily. , Disp: , Rfl:  .  atorvastatin (LIPITOR) 10 MG tablet, Take 10 mg by mouth daily., Disp: , Rfl:  .  cimetidine (TAGAMET) 400 MG tablet, Take 400 mg by mouth 2 (two) times daily. Breakfast & supper, Disp: , Rfl:  .  fluticasone (FLONASE) 50 MCG/ACT nasal spray, Place 1 spray into the nose at  bedtime. , Disp: , Rfl:  .  ibuprofen (ADVIL) 200 MG tablet, Take 400 mg by mouth daily as needed for moderate pain. , Disp: , Rfl:  .  loratadine (ALAVERT) 10 MG dissolvable tablet, Take 10 mg by mouth daily. , Disp: , Rfl:  .  Menthol, Topical Analgesic, (BLUE-EMU MAXIMUM STRENGTH EX), Apply 1 application topically 4 (four) times daily as needed (joint pain). , Disp: , Rfl:  .  montelukast (SINGULAIR) 10 MG tablet, TAKE 1 TABLET BY MOUTH EVERY DAY AT NIGHT, Disp: , Rfl:  .  Multiple Vitamin (MULTIVITAMIN WITH MINERALS) TABS tablet, Take 1 tablet by mouth daily. Iron Free, Disp: , Rfl:  .  omeprazole (PRILOSEC) 40 MG capsule, Take 40 mg by mouth daily before breakfast. , Disp: , Rfl:  .  Sodium Chloride-Sodium Bicarb (NETI POT SINUS [place] NA), Place 1 Dose into the nose at bedtime as needed (congestion)., Disp: , Rfl:  .  ELMIRON 100 MG capsule, Take 100 mg by mouth 3 (three) times daily., Disp: , Rfl:  .  hydrocortisone 2.5 % cream, Apply topically 2 (two) times daily. Up to 2 weeks at underarms (Patient not taking: Reported on 05/22/2020), Disp: 30 g, Rfl: 1 .   hydrOXYzine (ATARAX/VISTARIL) 25 MG tablet, Take 25 mg by mouth daily., Disp: , Rfl:  .  mupirocin ointment (BACTROBAN) 2 %, Apply 1 application topically daily. To open wound at right ear (Patient not taking: Reported on 05/22/2020), Disp: 22 g, Rfl: 1 .  tamsulosin (FLOMAX) 0.4 MG CAPS capsule, Take 0.4 mg by mouth daily., Disp: , Rfl:   Physical exam:  Vitals:   05/22/20 1315  BP: 128/88  Pulse: 90  Resp: 20  Temp: 97.9 F (36.6 C)  TempSrc: Tympanic  SpO2: 100%  Weight: 206 lb 14.4 oz (93.8 kg)   Physical Exam Constitutional:      General: He is not in acute distress. Eyes:     Extraocular Movements: EOM normal.  Cardiovascular:     Rate and Rhythm: Normal rate and regular rhythm.     Heart sounds: Normal heart sounds.  Pulmonary:     Effort: Pulmonary effort is normal.     Breath sounds: Normal breath sounds.  Skin:    General: Skin is warm and dry.  Neurological:     Mental Status: He is oriented to person, place, and time.      CMP Latest Ref Rng & Units 05/15/2020  Glucose 70 - 99 mg/dL 103(H)  BUN 8 - 23 mg/dL 18  Creatinine 0.61 - 1.24 mg/dL 0.93  Sodium 135 - 145 mmol/L 139  Potassium 3.5 - 5.1 mmol/L 4.3  Chloride 98 - 111 mmol/L 104  CO2 22 - 32 mmol/L 24  Calcium 8.9 - 10.3 mg/dL 8.9  Total Protein 6.5 - 8.1 g/dL 6.9  Total Bilirubin 0.3 - 1.2 mg/dL 0.4  Alkaline Phos 38 - 126 U/L 36(L)  AST 15 - 41 U/L 26  ALT 0 - 44 U/L 23   CBC Latest Ref Rng & Units 05/15/2020  WBC 4.0 - 10.5 K/uL 5.1  Hemoglobin 13.0 - 17.0 g/dL 14.9  Hematocrit 39.0 - 52.0 % 42.5  Platelets 150 - 400 K/uL 175     Assessment and plan- Patient is a 75 y.o. male with IgM MGUS here for routine follow-up  Patient CBC is presently within normal limits IgM levels are mildly elevated at 185 and overall stable for the last 1 year.  M protein has fluctuated between 0.1  to 0.3 g.  Serum free light chain ratio between 1.75-1.85.  Overall his blood work is quite stable with no concerning  findings of progression of MGUS.  I will continue to see him on a yearly basis.  I will see him back in 1 year with CBC with differential, CMP, myeloma panel and serum free light chains followed by a video visit  Visit Diagnosis 1. MGUS (monoclonal gammopathy of unknown significance)      Dr. Randa Evens, MD, MPH [facility] [phone] 05/22/2020 1:11 PM

## 2020-05-30 ENCOUNTER — Ambulatory Visit: Payer: Medicare HMO | Admitting: Dermatology

## 2020-05-30 ENCOUNTER — Other Ambulatory Visit: Payer: Self-pay

## 2020-05-30 ENCOUNTER — Ambulatory Visit (INDEPENDENT_AMBULATORY_CARE_PROVIDER_SITE_OTHER): Payer: Medicare HMO | Admitting: Dermatology

## 2020-05-30 DIAGNOSIS — Z86007 Personal history of in-situ neoplasm of skin: Secondary | ICD-10-CM | POA: Diagnosis not present

## 2020-05-30 DIAGNOSIS — L304 Erythema intertrigo: Secondary | ICD-10-CM

## 2020-05-30 DIAGNOSIS — L570 Actinic keratosis: Secondary | ICD-10-CM

## 2020-05-30 DIAGNOSIS — L578 Other skin changes due to chronic exposure to nonionizing radiation: Secondary | ICD-10-CM

## 2020-05-30 NOTE — Progress Notes (Signed)
   Follow-Up Visit   Subjective  Donald Barry is a 75 y.o. male who presents for the following: Actinic Keratosis (2 months f/u on Ak, pt treated with 5FU/Calcipotriene cream on face and ears).  Recheck intertrigo axillae. Pt used hydrocortisone 2.5% cream with improvement but the rash came back when he stopped  Follow-up (Hx of SCCis L vertex scalp  treated with 5FU/Calcipotriene)  The following portions of the chart were reviewed this encounter and updated as appropriate:   Tobacco  Allergies  Meds  Problems  Med Hx  Surg Hx  Fam Hx      Review of Systems:  No other skin or systemic complaints except as noted in HPI or Assessment and Plan.  Objective  Well appearing patient in no apparent distress; mood and affect are within normal limits.  A focused examination was performed including face,scalp,ears. Relevant physical exam findings are noted in the Assessment and Plan.  Objective  Right base of thumb: Erythematous thin papules/macules with gritty scale.   Objective  R axilla, L axilla: Erythematous patches  Objective  Left vertex scalp: Well healed scar with no evidence of recurrence.    Assessment & Plan  AK (actinic keratosis) Right base of thumb  Discussed LN2 vs 5FU/Calcipotriene cream. Pt prefers cream.  Apply prescription 5-fluorouracil/calcipotriene cream to thumb x 7 days then stop.  Reviewed expected irritation, redness, and crusting.  Stop early for any pain or bleeding.   Erythema intertrigo R axilla, L axilla  Recurred after stopping hydrocortisone 2.5% cream  Start otc roll on antiperspirant daily to reduce moisture in area Restart Hydrocortisone 2.5% cream twice a day as needed up to 2 weeks. Call if worsening rather than clearing with treatment.   Topical steroids (such as triamcinolone, fluocinolone, fluocinonide, mometasone, clobetasol, halobetasol, betamethasone, hydrocortisone) can cause thinning and lightening of the skin if they are  used for too long in the same area. Your physician has selected the right strength medicine for your problem and area affected on the body. Please use your medication only as directed by your physician to prevent side effects.    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 Observe for recurrence. Call clinic for new or changing lesions.  Recommend regular skin exams, daily broad-spectrum spf 30+ sunscreen use, and photoprotection.     Severe Actinic Damage at Face s/p 5-FU/Calcipotriene  -Still with some scale and erythema at cheeks consistent with resolving medication reaction. - Will recheck for any residual actinic damage at follow-up once site has longer to heal. - Recommend daily broad spectrum sunscreen SPF 30+ to sun-exposed areas, reapply every 2 hours as needed. Call for new or changing lesions.   Return in about 1 month (around 06/30/2020) for TBSE and recheck Aks .  I, Donald Barry, CMA, am acting as scribe for [name], MD .  Documentation: I have reviewed the above documentation for accuracy and completeness, and I agree with the above.  [name], MD

## 2020-05-30 NOTE — Patient Instructions (Signed)
5-Fluorouracil/Calcipotriene Patient Education  ° °Actinic keratoses are the dry, red scaly spots on the skin caused by sun damage. A portion of these spots can turn into skin cancer with time, and treating them can help prevent development of skin cancer.  ° °Treatment of these spots requires removal of the defective skin cells. There are various ways to remove actinic keratoses, including freezing with liquid nitrogen, treatment with creams, or treatment with a blue light procedure in the office.  ° °5-fluorouracil cream is a topical cream used to treat actinic keratoses. It works by interfering with the growth of abnormal fast-growing skin cells, such as actinic keratoses. These cells peel off and are replaced by healthy ones.  ° °5-fluorouracil/calcipotriene is a combination of the 5-fluorouracil cream with a vitamin D analog cream called calcipotriene. The calcipotriene alone does not treat actinic keratoses. However, when it is combined with 5-fluorouracil, it helps the 5-fluorouracil treat the actinic keratoses much faster so that the same results can be achieved with a much shorter treatment time. ° °INSTRUCTIONS FOR 5-FLUOROURACIL/CALCIPOTRIENE CREAM:  ° °5-fluorouracil/calcipotriene cream typically only needs to be used for 4-7 days. A thin layer should be applied twice a day to the treatment areas recommended by your physician.  ° °If your physician prescribed you separate tubes of 5-fluourouracil and calcipotriene, apply a thin layer of 5-fluorouracil followed by a thin layer of calcipotriene.  ° °Avoid contact with your eyes, nostrils, and mouth. Do not use 5-fluorouracil/calcipotriene cream on infected or open wounds.  ° °You will develop redness, irritation and some crusting at areas where you have pre-cancer damage/actinic keratoses. IF YOU DEVELOP PAIN, BLEEDING, OR SIGNIFICANT CRUSTING, STOP THE TREATMENT EARLY - you have already gotten a good response and the actinic keratoses should clear up  well. ° °Wash your hands after applying 5-fluorouracil 5% cream on your skin.  ° °A moisturizer or sunscreen with a minimum SPF 30 should be applied each morning.  ° °Once you have finished the treatment, you can apply a thin layer of Vaseline twice a day to irritated areas to soothe and calm the areas more quickly. If you experience significant discomfort, contact your physician. ° °For some patients it is necessary to repeat the treatment for best results. ° °SIDE EFFECTS: When using 5-fluorouracil/calcipotriene cream, you may have mild irritation, such as redness, dryness, swelling, or a mild burning sensation. This usually resolves within 2 weeks. The more actinic keratoses you have, the more redness and inflammation you can expect during treatment. Eye irritation has been reported rarely. If this occurs, please let us know.  °If you have any trouble using this cream, please call the office. If you have any other questions about this information, please do not hesitate to ask me before you leave the office. °

## 2020-05-31 ENCOUNTER — Encounter: Payer: Self-pay | Admitting: Dermatology

## 2020-07-13 ENCOUNTER — Ambulatory Visit: Payer: Medicare HMO | Admitting: Dermatology

## 2020-08-29 ENCOUNTER — Encounter: Payer: Self-pay | Admitting: Dermatology

## 2020-08-29 ENCOUNTER — Other Ambulatory Visit: Payer: Self-pay

## 2020-08-29 ENCOUNTER — Ambulatory Visit (INDEPENDENT_AMBULATORY_CARE_PROVIDER_SITE_OTHER): Payer: Medicare HMO | Admitting: Dermatology

## 2020-08-29 DIAGNOSIS — Z86007 Personal history of in-situ neoplasm of skin: Secondary | ICD-10-CM | POA: Diagnosis not present

## 2020-08-29 DIAGNOSIS — L821 Other seborrheic keratosis: Secondary | ICD-10-CM

## 2020-08-29 DIAGNOSIS — Z1283 Encounter for screening for malignant neoplasm of skin: Secondary | ICD-10-CM | POA: Diagnosis not present

## 2020-08-29 DIAGNOSIS — L304 Erythema intertrigo: Secondary | ICD-10-CM

## 2020-08-29 DIAGNOSIS — D1800 Hemangioma unspecified site: Secondary | ICD-10-CM

## 2020-08-29 DIAGNOSIS — L578 Other skin changes due to chronic exposure to nonionizing radiation: Secondary | ICD-10-CM

## 2020-08-29 DIAGNOSIS — L570 Actinic keratosis: Secondary | ICD-10-CM | POA: Diagnosis not present

## 2020-08-29 DIAGNOSIS — D229 Melanocytic nevi, unspecified: Secondary | ICD-10-CM

## 2020-08-29 DIAGNOSIS — L814 Other melanin hyperpigmentation: Secondary | ICD-10-CM

## 2020-08-29 NOTE — Patient Instructions (Addendum)
Recommend taking Heliocare sun protection supplement daily in sunny weather for additional sun protection. For maximum protection on the sunniest days, you can take up to 2 capsules of regular Heliocare OR take 1 capsule of Heliocare Ultra. For prolonged exposure (such as a full day in the sun), you can repeat your dose of the supplement 4 hours after your first dose. Heliocare can be purchased at [facility] or at [email or web address].     Recommend daily broad spectrum sunscreen SPF 30+ to sun-exposed areas, reapply every 2 hours as needed. Call for new or changing lesions.  Staying in the shade or wearing long sleeves, sun glasses (UVA+UVB protection) and wide brim hats (4-inch brim around the entire circumference of the hat) are also recommended for sun protection.     5-Fluorouracil/Calcipotriene Patient Education   Actinic keratoses are the dry, red scaly spots on the skin caused by sun damage. A portion of these spots can turn into skin cancer with time, and treating them can help prevent development of skin cancer.   Treatment of these spots requires removal of the defective skin cells. There are various ways to remove actinic keratoses, including freezing with liquid nitrogen, treatment with creams, or treatment with a blue light procedure in the office.   5-fluorouracil cream is a topical cream used to treat actinic keratoses. It works by interfering with the growth of abnormal fast-growing skin cells, such as actinic keratoses. These cells peel off and are replaced by healthy ones.   5-fluorouracil/calcipotriene is a combination of the 5-fluorouracil cream with a vitamin D analog cream called calcipotriene. The calcipotriene alone does not treat actinic keratoses. However, when it is combined with 5-fluorouracil, it helps the 5-fluorouracil treat the actinic keratoses much faster so that the same results can be achieved with a much shorter treatment time.  INSTRUCTIONS FOR  5-FLUOROURACIL/CALCIPOTRIENE CREAM:   5-fluorouracil/calcipotriene cream typically only needs to be used for 4-7 days. A thin layer should be applied twice a day to the treatment areas recommended by your physician.   If your physician prescribed you separate tubes of 5-fluourouracil and calcipotriene, apply a thin layer of 5-fluorouracil followed by a thin layer of calcipotriene.   Avoid contact with your eyes, nostrils, and mouth. Do not use 5-fluorouracil/calcipotriene cream on infected or open wounds.   You will develop redness, irritation and some crusting at areas where you have pre-cancer damage/actinic keratoses. IF YOU DEVELOP PAIN, BLEEDING, OR SIGNIFICANT CRUSTING, STOP THE TREATMENT EARLY - you have already gotten a good response and the actinic keratoses should clear up well.  Wash your hands after applying 5-fluorouracil 5% cream on your skin.   A moisturizer or sunscreen with a minimum SPF 30 should be applied each morning.   Once you have finished the treatment, you can apply a thin layer of Vaseline twice a day to irritated areas to soothe and calm the areas more quickly. If you experience significant discomfort, contact your physician.  For some patients it is necessary to repeat the treatment for best results.  SIDE EFFECTS: When using 5-fluorouracil/calcipotriene cream, you may have mild irritation, such as redness, dryness, swelling, or a mild burning sensation. This usually resolves within 2 weeks. The more actinic keratoses you have, the more redness and inflammation you can expect during treatment. Eye irritation has been reported rarely. If this occurs, please let us know.  If you have any trouble using this cream, please call the office. If you have any other questions about this information,  please do not hesitate to ask me before you leave the office.

## 2020-08-29 NOTE — Progress Notes (Signed)
Follow-Up Visit   Subjective  Donald Barry is a 75 y.o. male who presents for the following: Annual Exam (Mole check, hx of SCCIs on the Left vertex scalp ) and Actinic Keratosis (Check cheeks and right base of thumb treated with 5FU/Calcipotriene cream 2 months ago ).  Patient here for full body skin exam and skin cancer screening.  The following portions of the chart were reviewed this encounter and updated as appropriate:   Tobacco  Allergies  Meds  Problems  Med Hx  Surg Hx  Fam Hx      Review of Systems:  No other skin or systemic complaints except as noted in HPI or Assessment and Plan.  Objective  Well appearing patient in no apparent distress; mood and affect are within normal limits.  A full examination was performed including scalp, head, eyes, ears, nose, lips, neck, chest, axillae, abdomen, back, buttocks, bilateral upper extremities, bilateral lower extremities, hands, feet, fingers, toes, fingernails, and toenails. All findings within normal limits unless otherwise noted below.  Objective  left vertex scalp: Well healed scar with no evidence of recurrence.   Objective  Head - Anterior (Face): Erythematous thin papules/macules with gritty scale.   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 Observe for recurrence. Call clinic for new or changing lesions.  Recommend regular skin exams, daily broad-spectrum spf 30+ sunscreen use, and photoprotection.     AK (actinic keratosis) Head - Anterior (Face)  - Start 5-fluorouracil/calcipotriene cream twice a day for 4-7 days to affected areas including right brow, right preauricular. Prescription sent to [facility]. Patient advised they will receive an email to purchase the medication online and have it sent to their home. Patient provided with handout reviewing  treatment course and side effects and advised to call or message [place] on MyChart with any concerns.   Erythema intertrigo Right Axilla, left axilla Restart Hydrocortisone 2.5% cream twice a day as needed up to 2 weeks. Call if not clearing or bothersome   Topical steroids (such as triamcinolone, fluocinolone, fluocinonide, mometasone, clobetasol, halobetasol, betamethasone, hydrocortisone) can cause thinning and lightening of the skin if they are used for too long in the same area. Your physician has selected the right strength medicine for your problem and area affected on the body. Please use your medication only as directed by your physician to prevent side effects.     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 - Severe, chronic, not at goal, secondary to cumulative UV radiation exposure over time - diffuse scaly erythematous macules and papules with underlying dyspigmentation at scalp - Discussed Prescription "Field Treatment" for Severe, Chronic Confluent Actinic Changes with Pre-Cancerous Actinic Keratoses Start 5FU/Calcipotriene cream apply to right preauricular, right nose, scalp twice a day x 7 days  Field treatment involves treatment of an entire area of skin that has confluent Actinic Changes (Sun/ Ultraviolet light damage) and PreCancerous Actinic Keratoses by method of PhotoDynamic Therapy (PDT) and/or prescription Topical Chemotherapy agents such as 5-fluorouracil, 5-fluorouracil/calcipotriene, and/or imiquimod.  The purpose is to decrease the number of clinically evident and subclinical PreCancerous lesions to prevent progression to development of skin cancer by chemically destroying early precancer changes that may or may not be visible.  It has been shown to reduce the risk of developing  skin cancer in the treated area. As a result of treatment, redness, scaling, crusting, and  open sores may occur during treatment course. One or more than one of these methods may be used and may have to be used several times to control, suppress and eliminate the PreCancerous changes. Discussed treatment course, expected reaction, and possible side effects. - Recommend daily broad spectrum sunscreen SPF 30+ to sun-exposed areas, reapply every 2 hours as needed.  - Staying in the shade or wearing long sleeves, sun glasses (UVA+UVB protection) and wide brim hats (4-inch brim around the entire circumference of the hat) are also recommended. - Call for new or changing lesions. - - Start 5-fluorouracil/calcipotriene cream twice a day for 7-10 days to affected areas including scalp. Prescription sent to [facility]. Patient advised they will receive an email to purchase the medication online and have it sent to their home. Patient provided with handout reviewing treatment course and side effects and advised to call or message [place] on MyChart with any concerns.   Lentigines - Scattered tan macules - Due to sun exposure - Benign-appering, observe - Recommend daily broad spectrum sunscreen SPF 30+ to sun-exposed areas, reapply every 2 hours as needed. - Call for any changes  Seborrheic Keratoses - Stuck-on, waxy, tan-brown papules and/or plaques  - Benign-appearing - Discussed benign etiology and prognosis. - Observe - Call for any changes  Melanocytic Nevi - Tan-brown and/or pink-flesh-colored symmetric macules and papules - Benign appearing on exam today - Observation - Call clinic for new or changing moles - Recommend daily use of broad spectrum spf 30+ sunscreen to sun-exposed areas.   Hemangiomas - Red papules - Discussed benign nature - Observe - Call for any changes  Skin cancer screening performed today.  Return in about 2 months (around 10/29/2020) for Aks, Actinic damage, TBSE in 6 months .  I, [name], CMA, am acting as scribe for [name], MD .  Documentation: I have reviewed the above documentation for accuracy and completeness, and I agree with the above.  [name], MD

## 2020-10-26 ENCOUNTER — Other Ambulatory Visit: Payer: Self-pay

## 2020-10-26 ENCOUNTER — Ambulatory Visit (INDEPENDENT_AMBULATORY_CARE_PROVIDER_SITE_OTHER): Payer: Medicare HMO | Admitting: Dermatology

## 2020-10-26 DIAGNOSIS — L570 Actinic keratosis: Secondary | ICD-10-CM

## 2020-10-26 DIAGNOSIS — L578 Other skin changes due to chronic exposure to nonionizing radiation: Secondary | ICD-10-CM

## 2020-10-26 DIAGNOSIS — Z85828 Personal history of other malignant neoplasm of skin: Secondary | ICD-10-CM | POA: Diagnosis not present

## 2020-10-26 NOTE — Progress Notes (Signed)
Follow-Up Visit   Subjective  Donald Barry is a 75 y.o. male who presents for the following: Follow-up (Patient here today for AK follow up at the scalp and face. Patient used 5FU/calcipotriene with good results. Patient advises he thinks there is still a little at scalp not resolved with topical treatment. Hx of SCC. ).  The following portions of the chart were reviewed this encounter and updated as appropriate:   Tobacco  Allergies  Meds  Problems  Med Hx  Surg Hx  Fam Hx       Review of Systems:  No other skin or systemic complaints except as noted in HPI or Assessment and Plan.  Objective  Well appearing patient in no apparent distress; mood and affect are within normal limits.  A focused examination was performed including face, scalp. Relevant physical exam findings are noted in the Assessment and Plan.  Vertex Scalp x 2, right helix x 1 (3) Erythematous thin papules/macules with gritty scale.    Assessment & Plan  AK (actinic keratosis) (3) Vertex Scalp x 2, right helix x 1  Actinic keratoses are precancerous spots that appear secondary to cumulative UV radiation exposure/sun exposure over time. They are chronic with expected duration over 1 year. A portion of actinic keratoses will progress to squamous cell carcinoma of the skin. It is not possible to reliably predict which spots will progress to skin cancer and so treatment is recommended to prevent development of skin cancer.  Recommend daily broad spectrum sunscreen SPF 30+ to sun-exposed areas, reapply every 2 hours as needed.  Recommend staying in the shade or wearing long sleeves, sun glasses (UVA+UVB protection) and wide brim hats (4-inch brim around the entire circumference of the hat). Call for new or changing lesions.  Prior to procedure, discussed risks of blister formation, small wound, skin dyspigmentation, or rare scar following cryotherapy. Recommend Vaseline ointment to treated areas while  healing.   Destruction of lesion - Vertex Scalp x 2, right helix x 1 Complexity: simple   Destruction method: cryotherapy   Informed consent: discussed and consent obtained   Timeout:  patient name, date of birth, surgical site, and procedure verified Lesion destroyed using liquid nitrogen: Yes   Region frozen until ice ball extended beyond lesion: Yes   Outcome: patient tolerated procedure well with no complications   Post-procedure details: wound care instructions given    Actinic Damage - Severe, confluent actinic changes with pre-cancerous actinic keratoses  - Severe, chronic, not at goal, secondary to cumulative UV radiation exposure over time - diffuse scaly erythematous macules and papules with underlying dyspigmentation - Discussed Prescription "Field Treatment" for Severe, Chronic Confluent Actinic Changes with Pre-Cancerous Actinic Keratoses Field treatment involves treatment of an entire area of skin that has confluent Actinic Changes (Sun/ Ultraviolet light damage) and PreCancerous Actinic Keratoses by method of PhotoDynamic Therapy (PDT) and/or prescription Topical Chemotherapy agents such as 5-fluorouracil, 5-fluorouracil/calcipotriene, and/or imiquimod.  The purpose is to decrease the number of clinically evident and subclinical PreCancerous lesions to prevent progression to development of skin cancer by chemically destroying early precancer changes that may or may not be visible.  It has been shown to reduce the risk of developing skin cancer in the treated area. As a result of treatment, redness, scaling, crusting, and open sores may occur during treatment course. One or more than one of these methods may be used and may have to be used several times to control, suppress and eliminate the PreCancerous changes. Discussed  treatment course, expected reaction, and possible side effects. - Recommend daily broad spectrum sunscreen SPF 30+ to sun-exposed areas, reapply every 2 hours as  needed.  - Staying in the shade or wearing long sleeves, sun glasses (UVA+UVB protection) and wide brim hats (4-inch brim around the entire circumference of the hat) are also recommended. - Call for new or changing lesions. - Start 5-fluorouracil/calcipotriene cream twice a day for 4-7 days as directed (4 days to face areas, 7 days to other areas) to affected areas including right preauricular, jaw and neck, left preauricular, cheek, jaw and neck. Prescription sent to [facility]. Patient advised they will receive an email to purchase the medication online and have it sent to their home. Patient provided with handout reviewing treatment course and side effects and advised to call or message [place] on MyChart with any concerns.  Return for TBSE, as scheduled.  Graciella Belton, RMA, am acting as scribe for [name], MD .  Documentation: I have reviewed the above documentation for accuracy and completeness, and I agree with the above.  [name], MD

## 2020-10-26 NOTE — Patient Instructions (Signed)
Cryotherapy Aftercare  Wash gently with soap and water everyday.   Apply Vaseline and Band-Aid daily until healed.   Prior to procedure, discussed risks of blister formation, small wound, skin dyspigmentation, or rare scar following cryotherapy. Recommend Vaseline ointment to treated areas while healing.   Start 5-fluorouracil/calcipotriene cream twice a day for 4-7 days to affected areas including right preauricular, jaw and neck, left preauricular, cheek, jaw and neck. Prescription sent to [facility]. Patient advised they will receive an email to purchase the medication online and have it sent to their home. Patient provided with handout reviewing treatment course and side effects and advised to call or message [place] on MyChart with any concerns.

## 2020-11-08 ENCOUNTER — Encounter: Payer: Self-pay | Admitting: Dermatology

## 2020-12-21 DIAGNOSIS — Z906 Acquired absence of other parts of urinary tract: Secondary | ICD-10-CM | POA: Insufficient documentation

## 2021-01-10 ENCOUNTER — Emergency Department
Admission: EM | Admit: 2021-01-10 | Discharge: 2021-01-11 | Disposition: A | Discharge status: Home / Self Care | Payer: Medicare HMO | Source: Home / Self Care | Attending: Emergency Medicine | Admitting: Emergency Medicine

## 2021-01-10 ENCOUNTER — Encounter: Payer: Self-pay | Admitting: *Deleted

## 2021-01-10 ENCOUNTER — Other Ambulatory Visit: Payer: Self-pay

## 2021-01-10 DIAGNOSIS — Z85828 Personal history of other malignant neoplasm of skin: Secondary | ICD-10-CM | POA: Insufficient documentation

## 2021-01-10 DIAGNOSIS — K5900 Constipation, unspecified: Secondary | ICD-10-CM | POA: Insufficient documentation

## 2021-01-10 DIAGNOSIS — R8281 Pyuria: Secondary | ICD-10-CM | POA: Insufficient documentation

## 2021-01-10 DIAGNOSIS — Z7982 Long term (current) use of aspirin: Secondary | ICD-10-CM | POA: Insufficient documentation

## 2021-01-10 DIAGNOSIS — T8143XA Infection following a procedure, organ and space surgical site, initial encounter: Secondary | ICD-10-CM | POA: Diagnosis not present

## 2021-01-10 DIAGNOSIS — N12 Tubulo-interstitial nephritis, not specified as acute or chronic: Secondary | ICD-10-CM | POA: Diagnosis not present

## 2021-01-10 LAB — URINALYSIS, COMPLETE (UACMP) WITH MICROSCOPIC
Bacteria, UA: NONE SEEN
Bilirubin Urine: NEGATIVE
Glucose, UA: NEGATIVE mg/dL
Ketones, ur: NEGATIVE mg/dL
Nitrite: POSITIVE — AB
Protein, ur: 30 mg/dL — AB
Specific Gravity, Urine: 1.005 (ref 1.005–1.030)
Squamous Epithelial / HPF: NONE SEEN (ref 0–5)
WBC, UA: NONE SEEN WBC/hpf (ref 0–5)
pH: 7 (ref 5.0–8.0)

## 2021-01-10 LAB — COMPREHENSIVE METABOLIC PANEL
ALT: 15 U/L (ref 0–44)
AST: 21 U/L (ref 15–41)
Albumin: 3.6 g/dL (ref 3.5–5.0)
Alkaline Phosphatase: 33 U/L — ABNORMAL LOW (ref 38–126)
Anion gap: 10 (ref 5–15)
BUN: 11 mg/dL (ref 8–23)
CO2: 23 mmol/L (ref 22–32)
Calcium: 9.1 mg/dL (ref 8.9–10.3)
Chloride: 104 mmol/L (ref 98–111)
Creatinine, Ser: 0.87 mg/dL (ref 0.61–1.24)
GFR, Estimated: >60 mL/min (ref >60)
Glucose, Bld: 102 mg/dL — ABNORMAL HIGH (ref 70–99)
Potassium: 4.2 mmol/L (ref 3.5–5.1)
Sodium: 137 mmol/L (ref 135–145)
Total Bilirubin: 0.7 mg/dL (ref 0.3–1.2)
Total Protein: 6.6 g/dL (ref 6.5–8.1)

## 2021-01-10 LAB — CBC
HCT: 30.8 % — ABNORMAL LOW (ref 39.0–52.0)
Hemoglobin: 10.1 g/dL — ABNORMAL LOW (ref 13.0–17.0)
MCH: 29.4 pg (ref 26.0–34.0)
MCHC: 32.8 g/dL (ref 30.0–36.0)
MCV: 89.5 fL (ref 80.0–100.0)
Platelets: 313 10*3/uL (ref 150–400)
RBC: 3.44 MIL/uL — ABNORMAL LOW (ref 4.22–5.81)
RDW: 14.6 % (ref 11.5–15.5)
WBC: 8 10*3/uL (ref 4.0–10.5)
nRBC: 0 % (ref 0.0–0.2)

## 2021-01-10 LAB — LIPASE, BLOOD: Lipase: 37 U/L (ref 11–51)

## 2021-01-10 NOTE — ED Triage Notes (Signed)
Pt ambulatory to triage. Reports he had surgery on 12/19/20 for bladder/prostate/bowel. Constipation since Sunday. Using Mirilax, colace, suppository without relief. Nausea, loss of appetite, passiing "very little" gas.

## 2021-01-11 ENCOUNTER — Other Ambulatory Visit: Payer: Self-pay

## 2021-01-11 DIAGNOSIS — Z85828 Personal history of other malignant neoplasm of skin: Secondary | ICD-10-CM

## 2021-01-11 DIAGNOSIS — G8929 Other chronic pain: Secondary | ICD-10-CM | POA: Diagnosis present

## 2021-01-11 DIAGNOSIS — E869 Volume depletion, unspecified: Secondary | ICD-10-CM | POA: Diagnosis present

## 2021-01-11 DIAGNOSIS — Z91013 Allergy to seafood: Secondary | ICD-10-CM

## 2021-01-11 DIAGNOSIS — T8143XA Infection following a procedure, organ and space surgical site, initial encounter: Principal | ICD-10-CM | POA: Diagnosis present

## 2021-01-11 DIAGNOSIS — D696 Thrombocytopenia, unspecified: Secondary | ICD-10-CM | POA: Diagnosis present

## 2021-01-11 DIAGNOSIS — E871 Hypo-osmolality and hyponatremia: Secondary | ICD-10-CM | POA: Diagnosis present

## 2021-01-11 DIAGNOSIS — Z936 Other artificial openings of urinary tract status: Secondary | ICD-10-CM

## 2021-01-11 DIAGNOSIS — Y838 Other surgical procedures as the cause of abnormal reaction of the patient, or of later complication, without mention of misadventure at the time of the procedure: Secondary | ICD-10-CM | POA: Diagnosis present

## 2021-01-11 DIAGNOSIS — N10 Acute tubulo-interstitial nephritis: Secondary | ICD-10-CM | POA: Diagnosis present

## 2021-01-11 DIAGNOSIS — K760 Fatty (change of) liver, not elsewhere classified: Secondary | ICD-10-CM | POA: Diagnosis present

## 2021-01-11 DIAGNOSIS — Z91018 Allergy to other foods: Secondary | ICD-10-CM

## 2021-01-11 DIAGNOSIS — K5900 Constipation, unspecified: Secondary | ICD-10-CM | POA: Diagnosis present

## 2021-01-11 DIAGNOSIS — K651 Peritoneal abscess: Secondary | ICD-10-CM | POA: Diagnosis present

## 2021-01-11 DIAGNOSIS — Z20822 Contact with and (suspected) exposure to covid-19: Secondary | ICD-10-CM | POA: Diagnosis present

## 2021-01-11 DIAGNOSIS — K219 Gastro-esophageal reflux disease without esophagitis: Secondary | ICD-10-CM | POA: Diagnosis present

## 2021-01-11 DIAGNOSIS — Z91048 Other nonmedicinal substance allergy status: Secondary | ICD-10-CM

## 2021-01-11 DIAGNOSIS — J9601 Acute respiratory failure with hypoxia: Secondary | ICD-10-CM | POA: Diagnosis present

## 2021-01-11 DIAGNOSIS — Z9079 Acquired absence of other genital organ(s): Secondary | ICD-10-CM

## 2021-01-11 DIAGNOSIS — G4733 Obstructive sleep apnea (adult) (pediatric): Secondary | ICD-10-CM | POA: Diagnosis present

## 2021-01-11 DIAGNOSIS — D509 Iron deficiency anemia, unspecified: Secondary | ICD-10-CM | POA: Diagnosis present

## 2021-01-11 DIAGNOSIS — J309 Allergic rhinitis, unspecified: Secondary | ICD-10-CM | POA: Diagnosis present

## 2021-01-11 DIAGNOSIS — I4891 Unspecified atrial fibrillation: Secondary | ICD-10-CM | POA: Diagnosis not present

## 2021-01-11 DIAGNOSIS — Z885 Allergy status to narcotic agent status: Secondary | ICD-10-CM

## 2021-01-11 DIAGNOSIS — A4159 Other Gram-negative sepsis: Secondary | ICD-10-CM | POA: Diagnosis present

## 2021-01-11 DIAGNOSIS — Z79899 Other long term (current) drug therapy: Secondary | ICD-10-CM

## 2021-01-11 DIAGNOSIS — Z888 Allergy status to other drugs, medicaments and biological substances status: Secondary | ICD-10-CM

## 2021-01-11 DIAGNOSIS — I700 Atherosclerosis of aorta: Secondary | ICD-10-CM | POA: Diagnosis present

## 2021-01-11 DIAGNOSIS — J302 Other seasonal allergic rhinitis: Secondary | ICD-10-CM | POA: Diagnosis present

## 2021-01-11 DIAGNOSIS — M1990 Unspecified osteoarthritis, unspecified site: Secondary | ICD-10-CM | POA: Diagnosis present

## 2021-01-11 LAB — COMPREHENSIVE METABOLIC PANEL
ALT: 14 U/L (ref 0–44)
AST: 21 U/L (ref 15–41)
Albumin: 3.1 g/dL — ABNORMAL LOW (ref 3.5–5.0)
Alkaline Phosphatase: 30 U/L — ABNORMAL LOW (ref 38–126)
Anion gap: 9 (ref 5–15)
BUN: 11 mg/dL (ref 8–23)
CO2: 20 mmol/L — ABNORMAL LOW (ref 22–32)
Calcium: 8.5 mg/dL — ABNORMAL LOW (ref 8.9–10.3)
Chloride: 104 mmol/L (ref 98–111)
Creatinine, Ser: 0.85 mg/dL (ref 0.61–1.24)
GFR, Estimated: >60 mL/min (ref >60)
Glucose, Bld: 111 mg/dL — ABNORMAL HIGH (ref 70–99)
Potassium: 3.8 mmol/L (ref 3.5–5.1)
Sodium: 133 mmol/L — ABNORMAL LOW (ref 135–145)
Total Bilirubin: 1 mg/dL (ref 0.3–1.2)
Total Protein: 5.9 g/dL — ABNORMAL LOW (ref 6.5–8.1)

## 2021-01-11 LAB — CBC
HCT: 26.8 % — ABNORMAL LOW (ref 39.0–52.0)
Hemoglobin: 9.1 g/dL — ABNORMAL LOW (ref 13.0–17.0)
MCH: 29.2 pg (ref 26.0–34.0)
MCHC: 34 g/dL (ref 30.0–36.0)
MCV: 85.9 fL (ref 80.0–100.0)
Platelets: 231 10*3/uL (ref 150–400)
RBC: 3.12 MIL/uL — ABNORMAL LOW (ref 4.22–5.81)
RDW: 14.7 % (ref 11.5–15.5)
WBC: 5.4 10*3/uL (ref 4.0–10.5)
nRBC: 0 % (ref 0.0–0.2)

## 2021-01-11 LAB — LACTIC ACID, PLASMA: Lactic Acid, Venous: 1.2 mmol/L (ref 0.5–1.9)

## 2021-01-11 MED ORDER — ACETAMINOPHEN 325 MG PO TABS
650.0000 mg | ORAL_TABLET | Freq: Once | ORAL | Status: AC
  Administered 2021-01-11: 650 mg via ORAL
  Filled 2021-01-11: qty 2

## 2021-01-11 MED ORDER — DOCUSATE SODIUM 50 MG/5ML PO LIQD
100.0000 mg | ORAL | Status: DC
  Filled 2021-01-11: qty 10

## 2021-01-11 MED ORDER — LACTULOSE 10 GM/15ML PO SOLN
20.0000 g | Freq: Once | ORAL | Status: AC
  Administered 2021-01-11: 20 g via ORAL
  Filled 2021-01-11: qty 30

## 2021-01-11 NOTE — ED Triage Notes (Addendum)
Pt in with co lower abd and lower back pain that started today. Pt had CYSTECTOMY W/ ILEAL CONDUIT DIVERSION L1  8/9 . Pt was seen here last night for constipation but did not have same symptoms. Pt also has fever.

## 2021-01-11 NOTE — ED Triage Notes (Signed)
EMS brings pt in from home; seen recently for constipation; today c/o left flank pain and lower abd pain accomp by nausea

## 2021-01-11 NOTE — Discharge Instructions (Addendum)
You were seen in the emergency department today for constipation.  We recommend that you use one or more of the following over-the-counter medications in the order described:   1)  Miralax (powder):  This medication works by drawing additional fluid into your intestines and helps to flush out your stool.  Mix the powder with water or juice according to label instructions.  Be sure to use the recommended amount of water or juice when you mix up the powder.  Plenty of fluids will help to prevent constipation. 2)  Colace (or Dulcolax) 100 mg:  This is a stool softener, and you may take it once or twice a day as needed. 3)  Senna tablets:  This is a bowel stimulant that will help "push" out your stool. It is the next step to add after you have tried a stool softener.  If the three options above are not working, even when you use them together, look for magnesium citrate at the pharmacy (it is usually in a small glass bottle).  Drink the bottle according to the label instructions.  You may also want to consider using glycerin suppositories, which you insert into your rectum.  You hold it in place and is dissolves and softens your stool and stimulates your bowels.  You could also consider using an enema, which is also available over the counter.  Remember that narcotic pain medications are constipating, so avoid them or minimize their use.  Drink plenty of fluids.  Please let your urologist know that your urinalysis was notable for large leukocytes and was nitrite positive today.  Since you have no other symptoms and a urostomy, this is not altogether unexpected, but please let him know that a urine culture is pending and he should be able to check Epic for the results.  Please return to the Emergency Department immediately if you develop new or worsening symptoms that concern you, such as (but not limited to) fever > 101 degrees, severe abdominal pain, or persistent vomiting.

## 2021-01-11 NOTE — ED Provider Notes (Signed)
[facility] Emergency Department Provider Note  ____________________________________________   Event Date/Time   First MD Initiated Contact with Patient 01/11/21 0009     (approximate)  I have reviewed the triage vital signs and the nursing notes.   HISTORY  Chief Complaint Constipation    HPI Donald Barry is a 75 y.o. male with medical history as listed below which most recently included urological (bladder and prostate) surgery at [facility] about a month ago.  He has a diverting urostomy with external bag.  He presents for evaluation of constipation.  He said that this typically is not an issue for him and he usually has 2-3 bowel movements per day.  However since the surgery his bowels have not been completely regular.  He has not had any strong pain medicine for more than 2 weeks.  He was on MiraLAX for a while, then tried Colace, and has had no success.  He has not had any bowel movement at all for the last 4 to 5 days and spite of taking Colace and previously MiraLAX.  He has tried glycerin suppositories twice to no effect.  He looks for magnesium citrate at the store but was unable to find it (it is currently on nationwide recall).  He also tried a fleets enema and said that he was able to retain it for about 10 minutes but still had no output.  He is not having sharp abdominal pain but has intermittent waves of severe cramps.  He has been eating and drinking although he said a little bit less than usual.  No nausea no vomiting.  He said he had a slightly elevated oral temperature of 99.9 degrees earlier today but he does not feel febrile and was not febrile in triage.  He denies shortness of breath.  Nothing in particular makes his symptoms better or worse but they have become severe.     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 SCCis   Squamous cell skin cancer 07/2017   Resected from scalp.    Patient Active Problem List   Diagnosis Date Noted   Interstitial cystitis 03/04/2019   Obstructive sleep apnea 12/29/2018   Thrombocytopenia ([place]) 09/30/2018   Aortic atherosclerosis ([place]) 07/13/2018   Hepatic steatosis 04/05/2018   Hiatal hernia 09/09/2017   Hx of adenomatous colonic polyps 07/15/2017   Intermittent left lower quadrant abdominal pain 07/15/2017   Allergic rhinitis, seasonal 06/14/2014   Chronic neck pain 06/14/2014   Gastroesophageal reflux disease without esophagitis 06/14/2014    Past Surgical History:  Procedure Laterality Date   BACK SURGERY  2008   L4, L5   COLONOSCOPY     CYSTO WITH HYDRODISTENSION N/A 06/17/2019   Procedure: CYSTOSCOPY/HYDRODISTENSION;  Surgeon: Billey Co, MD;  Location: [facility] ORS;  Service:  Urology;  Laterality: N/A;   CYSTOSCOPY WITH BIOPSY N/A 07/17/2018   Procedure: CYSTOSCOPY WITH BLADDER BIOPSY;  Surgeon: Billey Co, MD;  Location: [facility] ORS;  Service: Urology;  Laterality: N/A;   CYSTOSCOPY WITH BIOPSY N/A 06/17/2019   Procedure: CYSTOSCOPY WITH BLADDER  BIOPSY;  Surgeon: Billey Co, MD;  Location: [facility] ORS;  Service: Urology;  Laterality: N/A;   CYSTOSCOPY WITH FULGERATION N/A 07/17/2018   Procedure: CYSTOSCOPY WITH FULGERATION;  Surgeon: Billey Co, MD;  Location: [facility] ORS;  Service: Urology;  Laterality: N/A;   CYSTOSCOPY WITH FULGERATION N/A 06/17/2019    Procedure: CYSTOSCOPY WITH FULGERATION;  Surgeon: Billey Co, MD;  Location: [facility] ORS;  Service: Urology;  Laterality: N/A;   ESOPHAGOGASTRODUODENOSCOPY     HERNIA REPAIR  0000000   umbilical   STOMACH SURGERY     FUNDIC GLAND POLYP    Prior to Admission medications   Medication Sig Start Date End Date Taking? Authorizing Provider  amitriptyline (ELAVIL) 50 MG tablet Take 1 tablet (50 mg total) by mouth at bedtime. 12/31/19   Nori Riis, PA-C  aspirin EC 81 MG tablet Take 81 mg by mouth daily.     [provider]  atorvastatin (LIPITOR) 10 MG tablet Take 10 mg by mouth daily. 08/06/19   [provider]  cimetidine (TAGAMET) 400 MG tablet Take 400 mg by mouth 2 (two) times daily. Breakfast & supper    [provider]  ELMIRON 100 MG capsule Take 100 mg by mouth 3 (three) times daily. 04/25/20   [provider]  fluticasone (FLONASE) 50 MCG/ACT nasal spray Place 1 spray into the nose at bedtime.     [provider]  hydrocortisone 2.5 % cream Apply topically 2 (two) times daily. Up to 2 weeks at underarms Patient not taking: Reported on 05/22/2020 03/23/20   Laurence Ferrari, [place], MD  hydrOXYzine (ATARAX/VISTARIL) 25 MG tablet Take 25 mg by mouth daily. 03/30/20   [provider]  ibuprofen (ADVIL) 200 MG tablet Take 400 mg by mouth daily as needed for moderate pain.     [provider]  loratadine (ALAVERT) 10 MG dissolvable tablet Take 10 mg by mouth daily.     [provider]  Menthol, Topical Analgesic, (BLUE-EMU MAXIMUM STRENGTH EX) Apply 1 application topically 4 (four) times daily as needed (joint pain).     [provider]  montelukast (SINGULAIR) 10 MG tablet TAKE 1 TABLET BY MOUTH EVERY DAY AT NIGHT 12/16/19   [provider]  Multiple Vitamin (MULTIVITAMIN WITH MINERALS) TABS tablet Take 1 tablet by mouth daily. Iron Free    [provider]  mupirocin ointment (BACTROBAN) 2 % Apply 1  application topically daily. To open wound at right ear Patient not taking: Reported on 05/22/2020 03/23/20   Laurence Ferrari, [place], MD  omeprazole (PRILOSEC) 40 MG capsule Take 40 mg by mouth daily before breakfast.     [provider]  Sodium Chloride-Sodium Bicarb (NETI POT SINUS [place] NA) Place 1 Dose into the nose at bedtime as needed (congestion).    [provider]  tamsulosin (FLOMAX) 0.4 MG CAPS capsule Take 0.4 mg by mouth daily. 03/14/20   [provider]    Allergies Tape, Wound dressing adhesive, Shellfish allergy, Shellfish-derived products, Onion, Other, and Codeine  Family History  Problem Relation Age of Onset   Hypertension Mother    GER disease Sister    GER disease Brother    Prostate cancer Neg Hx  Kidney cancer Neg Hx     Social History Social History   Tobacco Use   Smoking status: Never   Smokeless tobacco: Never  Vaping Use   Vaping Use: Never used  Substance Use Topics   Alcohol use: Never   Drug use: Never    Review of Systems Constitutional: No fever/chills Eyes: No visual changes. ENT: No sore throat. Cardiovascular: Denies chest pain. Respiratory: Denies shortness of breath. Gastrointestinal: Positive for constipation.  Positive for intermittent severe abdominal pain/cramps.  Negative for nausea and vomiting. Genitourinary: Positive for diverting urostomy. Musculoskeletal: Negative for neck pain.  Negative for back pain. Integumentary: Negative for rash. Neurological: Negative for headaches, focal weakness or numbness.   ____________________________________________   PHYSICAL EXAM:  VITAL SIGNS: ED Triage Vitals [01/10/21 1939]  Enc Vitals Group     BP 122/86     Pulse Rate (!) 116     Resp 16     Temp 98.8 F (37.1 C)     Temp Source Oral     SpO2 99 %     Weight      Height      Head Circumference      Peak Flow      Pain Score 3     Pain Loc      Pain Edu?      Excl. in [place]?     Constitutional:  Alert and oriented.  Eyes: Conjunctivae are normal.  Head: Atraumatic. Nose: No congestion/rhinnorhea. Mouth/Throat: Patient is wearing a mask. Neck: No stridor.  No meningeal signs.   Cardiovascular: Normal rate, regular rhythm. Good peripheral circulation. Respiratory: Normal respiratory effort.  No retractions. Gastrointestinal: Soft and nondistended.  Some mild tenderness to palpation around the urostomy site but otherwise no tenderness to palpation. Genitourinary: Functional urostomy bag present on lower abdomen, minimally tender to palpation around it, well appearing site. Musculoskeletal: No lower extremity tenderness nor edema. No gross deformities of extremities. Neurologic:  Normal speech and language. No gross focal neurologic deficits are appreciated.  Skin:  Skin is warm, dry and intact. Psychiatric: Mood and affect are normal. Speech and behavior are normal.  ____________________________________________   LABS (all labs ordered are listed, but only abnormal results are displayed)  Labs Reviewed  COMPREHENSIVE METABOLIC PANEL - Abnormal; Notable for the following components:      Result Value   Glucose, Bld 102 (*)    Alkaline Phosphatase 33 (*)    All other components within normal limits  CBC - Abnormal; Notable for the following components:   RBC 3.44 (*)    Hemoglobin 10.1 (*)    HCT 30.8 (*)    All other components within normal limits  URINALYSIS, COMPLETE (UACMP) WITH MICROSCOPIC - Abnormal; Notable for the following components:   Color, Urine YELLOW (*)    APPearance CLOUDY (*)    Hgb urine dipstick MODERATE (*)    Protein, ur 30 (*)    Nitrite POSITIVE (*)    Leukocytes,Ua LARGE (*)    All other components within normal limits  URINE CULTURE  LIPASE, BLOOD   ____________________________________________   RADIOLOGY  No results found.  ____________________________________________   INITIAL IMPRESSION / MDM / ASSESSMENT AND PLAN / ED COURSE  As  part of my medical decision making, I reviewed the following data within the [place] History obtained from family, Nursing notes reviewed and incorporated, Labs reviewed , Old chart reviewed, and Notes from prior ED visits   Differential diagnosis includes, but  is not limited to, constipation NOS, medication side effect, SBO/ileus, acute infection.  The patient's vital signs are stable and he is in no distress.  He is afebrile and has no significant tenderness to palpation the abdomen.  I suspect that a bowel perforation or SBO/ileus are very unlikely.  It sounds as if he has been struggling for the last few weeks but the issue has become more acute over the last few days.  I discussed it with the patient and at this point I do not think there is any indication for imaging.  He also states that he is unable to palpate any stool when he places the suppository so I think that a digital disimpaction may not be successful.  His preference is to first try an enema to see if it works.  I ordered a soapsuds enema with 100 mg of liquid Colace added to the bag and he is drinking lactulose 20 g p.o.  I will then reassess to see if we are successful or if in fact he would benefit from imaging and/or manual disimpaction.       Clinical Course as of 01/11/21 0334  Thu Jan 11, 2021  0331 Patient had "great success" with the enema and says that he feels like a new man.  He is ready go home and has no persistent pain or cramping.  I had my usual and customary constipation discussion with the patient in terms of outpatient management.  I also talked with him and his wife about his urinalysis.  I anticipate that he would have bacteria in the urine but I am somewhat concerned that the nitrite positive, large leukocyte results could represent an infection.  However, since he has no other symptoms, and reassuringly he has an appointment with his urologist in only 4-1/2 hours, I encouraged him to  follow-up and discussed the results with the urologist.  They will let the urologist know that the results are in epic and that urine culture is pending.  I will defer antibiotic therapy at this time.  I gave my usual and customary return precautions and they understand and agree. [CF]    Clinical Course User Index [CF] Hinda Kehr, MD     ____________________________________________  FINAL CLINICAL IMPRESSION(S) / ED DIAGNOSES  Final diagnoses:  Constipation, unspecified constipation type  Pyuria     MEDICATIONS GIVEN DURING THIS VISIT:  Medications  docusate (COLACE) 50 MG/5ML liquid 100 mg (100 mg Oral Not Given 01/11/21 0328)  lactulose (CHRONULAC) 10 GM/15ML solution 20 g (20 g Oral Given 01/11/21 0145)     ED Discharge Orders     None        Note:  This document was prepared using Dragon voice recognition software and may include unintentional dictation errors.   Hinda Kehr, MD 01/11/21 [phone]

## 2021-01-12 ENCOUNTER — Emergency Department: Payer: Medicare HMO

## 2021-01-12 ENCOUNTER — Inpatient Hospital Stay
Admission: EM | Admit: 2021-01-12 | Discharge: 2021-01-15 | DRG: 862 | Disposition: A | Discharge status: Home / Self Care | Payer: Medicare HMO | Attending: Internal Medicine | Admitting: Internal Medicine

## 2021-01-12 ENCOUNTER — Inpatient Hospital Stay
Admit: 2021-01-12 | Discharge: 2021-01-12 | Disposition: A | Discharge status: Home / Self Care | Payer: Medicare HMO | Attending: Internal Medicine | Admitting: Internal Medicine

## 2021-01-12 DIAGNOSIS — A419 Sepsis, unspecified organism: Secondary | ICD-10-CM | POA: Diagnosis not present

## 2021-01-12 DIAGNOSIS — I700 Atherosclerosis of aorta: Secondary | ICD-10-CM | POA: Diagnosis present

## 2021-01-12 DIAGNOSIS — K219 Gastro-esophageal reflux disease without esophagitis: Secondary | ICD-10-CM | POA: Diagnosis present

## 2021-01-12 DIAGNOSIS — Z91013 Allergy to seafood: Secondary | ICD-10-CM | POA: Diagnosis not present

## 2021-01-12 DIAGNOSIS — D509 Iron deficiency anemia, unspecified: Secondary | ICD-10-CM | POA: Diagnosis present

## 2021-01-12 DIAGNOSIS — E871 Hypo-osmolality and hyponatremia: Secondary | ICD-10-CM | POA: Diagnosis present

## 2021-01-12 DIAGNOSIS — M1990 Unspecified osteoarthritis, unspecified site: Secondary | ICD-10-CM | POA: Diagnosis present

## 2021-01-12 DIAGNOSIS — J9601 Acute respiratory failure with hypoxia: Secondary | ICD-10-CM | POA: Diagnosis present

## 2021-01-12 DIAGNOSIS — N1339 Other hydronephrosis: Secondary | ICD-10-CM | POA: Diagnosis not present

## 2021-01-12 DIAGNOSIS — N1330 Unspecified hydronephrosis: Secondary | ICD-10-CM | POA: Diagnosis not present

## 2021-01-12 DIAGNOSIS — R6520 Severe sepsis without septic shock: Secondary | ICD-10-CM

## 2021-01-12 DIAGNOSIS — Z885 Allergy status to narcotic agent status: Secondary | ICD-10-CM | POA: Diagnosis not present

## 2021-01-12 DIAGNOSIS — T8143XA Infection following a procedure, organ and space surgical site, initial encounter: Secondary | ICD-10-CM | POA: Diagnosis present

## 2021-01-12 DIAGNOSIS — N12 Tubulo-interstitial nephritis, not specified as acute or chronic: Secondary | ICD-10-CM

## 2021-01-12 DIAGNOSIS — J309 Allergic rhinitis, unspecified: Secondary | ICD-10-CM | POA: Diagnosis present

## 2021-01-12 DIAGNOSIS — R509 Fever, unspecified: Secondary | ICD-10-CM | POA: Diagnosis not present

## 2021-01-12 DIAGNOSIS — J302 Other seasonal allergic rhinitis: Secondary | ICD-10-CM | POA: Diagnosis present

## 2021-01-12 DIAGNOSIS — K651 Peritoneal abscess: Secondary | ICD-10-CM

## 2021-01-12 DIAGNOSIS — G8929 Other chronic pain: Secondary | ICD-10-CM | POA: Diagnosis present

## 2021-01-12 DIAGNOSIS — Z20822 Contact with and (suspected) exposure to covid-19: Secondary | ICD-10-CM | POA: Diagnosis present

## 2021-01-12 DIAGNOSIS — E869 Volume depletion, unspecified: Secondary | ICD-10-CM | POA: Diagnosis present

## 2021-01-12 DIAGNOSIS — I4891 Unspecified atrial fibrillation: Secondary | ICD-10-CM | POA: Diagnosis not present

## 2021-01-12 DIAGNOSIS — K760 Fatty (change of) liver, not elsewhere classified: Secondary | ICD-10-CM | POA: Diagnosis present

## 2021-01-12 DIAGNOSIS — Z888 Allergy status to other drugs, medicaments and biological substances status: Secondary | ICD-10-CM | POA: Diagnosis not present

## 2021-01-12 DIAGNOSIS — K5900 Constipation, unspecified: Secondary | ICD-10-CM | POA: Diagnosis present

## 2021-01-12 DIAGNOSIS — Y838 Other surgical procedures as the cause of abnormal reaction of the patient, or of later complication, without mention of misadventure at the time of the procedure: Secondary | ICD-10-CM | POA: Diagnosis present

## 2021-01-12 DIAGNOSIS — D696 Thrombocytopenia, unspecified: Secondary | ICD-10-CM | POA: Diagnosis present

## 2021-01-12 DIAGNOSIS — A4159 Other Gram-negative sepsis: Secondary | ICD-10-CM | POA: Diagnosis present

## 2021-01-12 DIAGNOSIS — G4733 Obstructive sleep apnea (adult) (pediatric): Secondary | ICD-10-CM | POA: Diagnosis present

## 2021-01-12 DIAGNOSIS — Z85828 Personal history of other malignant neoplasm of skin: Secondary | ICD-10-CM | POA: Diagnosis not present

## 2021-01-12 DIAGNOSIS — N10 Acute tubulo-interstitial nephritis: Secondary | ICD-10-CM | POA: Diagnosis present

## 2021-01-12 LAB — COMPREHENSIVE METABOLIC PANEL
ALT: 12 U/L (ref 0–44)
AST: 22 U/L (ref 15–41)
Albumin: 2.6 g/dL — ABNORMAL LOW (ref 3.5–5.0)
Alkaline Phosphatase: 25 U/L — ABNORMAL LOW (ref 38–126)
Anion gap: 9 (ref 5–15)
BUN: 11 mg/dL (ref 8–23)
CO2: 21 mmol/L — ABNORMAL LOW (ref 22–32)
Calcium: 8.1 mg/dL — ABNORMAL LOW (ref 8.9–10.3)
Chloride: 106 mmol/L (ref 98–111)
Creatinine, Ser: 0.89 mg/dL (ref 0.61–1.24)
GFR, Estimated: >60 mL/min (ref >60)
Glucose, Bld: 96 mg/dL (ref 70–99)
Potassium: 3.9 mmol/L (ref 3.5–5.1)
Sodium: 136 mmol/L (ref 135–145)
Total Bilirubin: 0.7 mg/dL (ref 0.3–1.2)
Total Protein: 5.2 g/dL — ABNORMAL LOW (ref 6.5–8.1)

## 2021-01-12 LAB — CBC WITH DIFFERENTIAL/PLATELET
Abs Immature Granulocytes: 0 10*3/uL (ref 0.00–0.07)
Abs Immature Granulocytes: 0 10*3/uL (ref 0.00–0.07)
Basophils Absolute: 0 10*3/uL (ref 0.0–0.1)
Basophils Absolute: 0 10*3/uL (ref 0.0–0.1)
Basophils Relative: 0 %
Basophils Relative: 0 %
Eosinophils Absolute: 0 10*3/uL (ref 0.0–0.5)
Eosinophils Absolute: 0 10*3/uL (ref 0.0–0.5)
Eosinophils Relative: 1 %
Eosinophils Relative: 0 %
HCT: 23.6 % — ABNORMAL LOW (ref 39.0–52.0)
HCT: 23.5 % — ABNORMAL LOW (ref 39.0–52.0)
Hemoglobin: 7.9 g/dL — ABNORMAL LOW (ref 13.0–17.0)
Hemoglobin: 7.7 g/dL — ABNORMAL LOW (ref 13.0–17.0)
Immature Granulocytes: 0 %
Immature Granulocytes: 0 %
Lymphocytes Relative: 6 %
Lymphocytes Relative: 2 %
Lymphs Abs: 0.1 10*3/uL — ABNORMAL LOW (ref 0.7–4.0)
Lymphs Abs: 0.1 10*3/uL — ABNORMAL LOW (ref 0.7–4.0)
MCH: 29.6 pg (ref 26.0–34.0)
MCH: 29.2 pg (ref 26.0–34.0)
MCHC: 33.5 g/dL (ref 30.0–36.0)
MCHC: 32.8 g/dL (ref 30.0–36.0)
MCV: 88.4 fL (ref 80.0–100.0)
MCV: 89 fL (ref 80.0–100.0)
Monocytes Absolute: 0 10*3/uL — ABNORMAL LOW (ref 0.1–1.0)
Monocytes Absolute: 0 10*3/uL — ABNORMAL LOW (ref 0.1–1.0)
Monocytes Relative: 2 %
Monocytes Relative: 1 %
Neutro Abs: 1.1 10*3/uL — ABNORMAL LOW (ref 1.7–7.7)
Neutro Abs: 3.7 10*3/uL (ref 1.7–7.7)
Neutrophils Relative %: 91 %
Neutrophils Relative %: 97 %
Platelets: 177 10*3/uL (ref 150–400)
Platelets: 162 10*3/uL (ref 150–400)
RBC: 2.67 MIL/uL — ABNORMAL LOW (ref 4.22–5.81)
RBC: 2.64 MIL/uL — ABNORMAL LOW (ref 4.22–5.81)
RDW: 15 % (ref 11.5–15.5)
RDW: 15 % (ref 11.5–15.5)
WBC: 1.2 10*3/uL — CL (ref 4.0–10.5)
WBC: 3.8 10*3/uL — ABNORMAL LOW (ref 4.0–10.5)
nRBC: 0 % (ref 0.0–0.2)
nRBC: 0 % (ref 0.0–0.2)

## 2021-01-12 LAB — BLOOD CULTURE ID PANEL (REFLEXED) - BCID2

## 2021-01-12 LAB — PROCALCITONIN: Procalcitonin: 15.1 ng/mL

## 2021-01-12 LAB — ECHOCARDIOGRAM COMPLETE
AR max vel: 3.37 cm2
AV Area VTI: 3.53 cm2
AV Area mean vel: 3.35 cm2
AV Mean grad: 4 mmHg
AV Peak grad: 8.2 mmHg
Ao pk vel: 1.43 m/s
Area-P 1/2: 2.78 cm2
MV VTI: 2.75 cm2
S' Lateral: 3.31 cm

## 2021-01-12 LAB — CBC
HCT: 27 % — ABNORMAL LOW (ref 39.0–52.0)
Hemoglobin: 8.3 g/dL — ABNORMAL LOW (ref 13.0–17.0)
MCH: 28.5 pg (ref 26.0–34.0)
MCHC: 30.7 g/dL (ref 30.0–36.0)
MCV: 92.8 fL (ref 80.0–100.0)
Platelets: 169 10*3/uL (ref 150–400)
RBC: 2.91 MIL/uL — ABNORMAL LOW (ref 4.22–5.81)
RDW: 15.5 % (ref 11.5–15.5)
WBC: 5.9 10*3/uL (ref 4.0–10.5)
nRBC: 0 % (ref 0.0–0.2)

## 2021-01-12 LAB — TYPE AND SCREEN
ABO/RH(D): A POS
Antibody Screen: NEGATIVE

## 2021-01-12 LAB — RESP PANEL BY RT-PCR (FLU A&B, COVID) ARPGX2
Influenza A by PCR: NEGATIVE
Influenza B by PCR: NEGATIVE
SARS Coronavirus 2 by RT PCR: NEGATIVE

## 2021-01-12 LAB — LACTIC ACID, PLASMA
Lactic Acid, Venous: 3.2 mmol/L (ref 0.5–1.9)
Lactic Acid, Venous: 7.3 mmol/L (ref 0.5–1.9)
Lactic Acid, Venous: 2.3 mmol/L (ref 0.5–1.9)

## 2021-01-12 LAB — IRON AND TIBC
Iron: 8 ug/dL — ABNORMAL LOW (ref 45–182)
Saturation Ratios: 3 % — ABNORMAL LOW (ref 17.9–39.5)
TIBC: 267 ug/dL (ref 250–450)
UIBC: 259 ug/dL

## 2021-01-12 LAB — PROTIME-INR
INR: 1.4 — ABNORMAL HIGH (ref 0.8–1.2)
Prothrombin Time: 17.2 seconds — ABNORMAL HIGH (ref 11.4–15.2)

## 2021-01-12 LAB — HEMOGLOBIN AND HEMATOCRIT, BLOOD
HCT: 25 % — ABNORMAL LOW (ref 39.0–52.0)
Hemoglobin: 7.9 g/dL — ABNORMAL LOW (ref 13.0–17.0)

## 2021-01-12 LAB — HEMOGLOBIN A1C
Hgb A1c MFr Bld: 5.2 % (ref 4.8–5.6)
Mean Plasma Glucose: 102.54 mg/dL

## 2021-01-12 LAB — TSH: TSH: 1.384 u[IU]/mL (ref 0.350–4.500)

## 2021-01-12 LAB — APTT: aPTT: 30 seconds (ref 24–36)

## 2021-01-12 LAB — FIBRINOGEN: Fibrinogen: 476 mg/dL — ABNORMAL HIGH (ref 210–475)

## 2021-01-12 LAB — MRSA NEXT GEN BY PCR, NASAL: MRSA by PCR Next Gen: NOT DETECTED

## 2021-01-12 MED ORDER — SODIUM CHLORIDE 0.9% FLUSH
3.0000 mL | Freq: Two times a day (BID) | INTRAVENOUS | Status: DC
  Administered 2021-01-12 – 2021-01-15 (×5): 3 mL via INTRAVENOUS

## 2021-01-12 MED ORDER — SODIUM CHLORIDE 0.9 % IV SOLN
1.0000 g | INTRAVENOUS | Status: AC
  Administered 2021-01-12: 1 g via INTRAVENOUS
  Filled 2021-01-12: qty 10

## 2021-01-12 MED ORDER — VANCOMYCIN HCL 2000 MG/400ML IV SOLN
2000.0000 mg | Freq: Once | INTRAVENOUS | Status: AC
  Administered 2021-01-12: 2000 mg via INTRAVENOUS
  Filled 2021-01-12: qty 400

## 2021-01-12 MED ORDER — MORPHINE SULFATE (PF) 4 MG/ML IV SOLN: 4.0000 mg | Freq: Once | INTRAVENOUS | Status: AC

## 2021-01-12 MED ORDER — SODIUM CHLORIDE 0.9 % IV BOLUS
1000.0000 mL | Freq: Once | INTRAVENOUS | Status: AC
  Administered 2021-01-12: 1000 mL via INTRAVENOUS

## 2021-01-12 MED ORDER — MORPHINE SULFATE (PF) 4 MG/ML IV SOLN
INTRAVENOUS | Status: AC
  Administered 2021-01-12: 4 mg via INTRAVENOUS
  Filled 2021-01-12: qty 1

## 2021-01-12 MED ORDER — PANTOPRAZOLE SODIUM 40 MG IV SOLR
40.0000 mg | INTRAVENOUS | Status: DC
  Administered 2021-01-12 – 2021-01-13 (×2): 40 mg via INTRAVENOUS
  Filled 2021-01-12 (×2): qty 40

## 2021-01-12 MED ORDER — METRONIDAZOLE 500 MG/100ML IV SOLN
500.0000 mg | Freq: Two times a day (BID) | INTRAVENOUS | Status: DC
  Administered 2021-01-12 – 2021-01-14 (×4): 500 mg via INTRAVENOUS
  Filled 2021-01-12 (×5): qty 100

## 2021-01-12 MED ORDER — SODIUM CHLORIDE 0.9 % IV SOLN: Freq: Once | INTRAVENOUS | Status: AC

## 2021-01-12 MED ORDER — SODIUM CHLORIDE 0.9 % IV SOLN: 2.0000 g | INTRAVENOUS | Status: DC

## 2021-01-12 MED ORDER — ENOXAPARIN SODIUM 40 MG/0.4ML IJ SOSY: 40.0000 mg | PREFILLED_SYRINGE | INTRAMUSCULAR | Status: DC

## 2021-01-12 MED ORDER — ALBUMIN HUMAN 25 % IV SOLN
25.0000 g | Freq: Once | INTRAVENOUS | Status: DC
  Filled 2021-01-12: qty 100

## 2021-01-12 MED ORDER — ONDANSETRON HCL 4 MG/2ML IJ SOLN
4.0000 mg | Freq: Four times a day (QID) | INTRAMUSCULAR | Status: DC | PRN
  Administered 2021-01-12: 4 mg via INTRAVENOUS
  Filled 2021-01-12: qty 2

## 2021-01-12 MED ORDER — SODIUM CHLORIDE 0.9 % IV BOLUS
500.0000 mL | Freq: Once | INTRAVENOUS | Status: AC
  Administered 2021-01-12: 500 mL via INTRAVENOUS

## 2021-01-12 MED ORDER — SODIUM CHLORIDE 0.9 % IV SOLN
1.0000 g | Freq: Once | INTRAVENOUS | Status: AC
  Administered 2021-01-12: 1 g via INTRAVENOUS
  Filled 2021-01-12: qty 10

## 2021-01-12 MED ORDER — NOREPINEPHRINE 4 MG/250ML-% IV SOLN
0.0000 ug/min | INTRAVENOUS | Status: DC
  Administered 2021-01-12: 2 ug/min via INTRAVENOUS
  Filled 2021-01-12: qty 250

## 2021-01-12 MED ORDER — SODIUM CHLORIDE 0.9 % IV SOLN: 250.0000 mL | INTRAVENOUS | Status: DC

## 2021-01-12 MED ORDER — SODIUM CHLORIDE 0.9 % IV SOLN
2.0000 g | Freq: Three times a day (TID) | INTRAVENOUS | Status: AC
  Administered 2021-01-12 – 2021-01-14 (×8): 2 g via INTRAVENOUS
  Filled 2021-01-12 (×8): qty 2

## 2021-01-12 MED ORDER — ACETAMINOPHEN 10 MG/ML IV SOLN
1000.0000 mg | Freq: Once | INTRAVENOUS | Status: AC
  Administered 2021-01-12: 1000 mg via INTRAVENOUS
  Filled 2021-01-12: qty 100

## 2021-01-12 MED ORDER — LACTATED RINGERS IV BOLUS
1000.0000 mL | Freq: Once | INTRAVENOUS | Status: AC
  Administered 2021-01-12: 1000 mL via INTRAVENOUS

## 2021-01-12 MED ORDER — MAGNESIUM SULFATE 2 GM/50ML IV SOLN
2.0000 g | Freq: Once | INTRAVENOUS | Status: AC
  Administered 2021-01-12: 2 g via INTRAVENOUS
  Filled 2021-01-12: qty 50

## 2021-01-12 MED ORDER — SODIUM CHLORIDE 0.9 % IV SOLN: 1.0000 g | INTRAVENOUS | Status: DC

## 2021-01-12 MED ORDER — MORPHINE SULFATE (PF) 2 MG/ML IV SOLN
2.0000 mg | INTRAVENOUS | Status: DC | PRN
  Administered 2021-01-14: 2 mg via INTRAVENOUS
  Filled 2021-01-12: qty 1

## 2021-01-12 MED ORDER — NOREPINEPHRINE 4 MG/250ML-% IV SOLN: 0.0000 ug/min | INTRAVENOUS | Status: DC

## 2021-01-12 MED ORDER — ACETAMINOPHEN 650 MG RE SUPP: 650.0000 mg | Freq: Four times a day (QID) | RECTAL | Status: DC | PRN

## 2021-01-12 MED ORDER — IOHEXOL 350 MG/ML SOLN
80.0000 mL | Freq: Once | INTRAVENOUS | Status: AC | PRN
  Administered 2021-01-12: 80 mL via INTRAVENOUS

## 2021-01-12 MED ORDER — ACETAMINOPHEN 325 MG PO TABS
650.0000 mg | ORAL_TABLET | Freq: Four times a day (QID) | ORAL | Status: DC | PRN
  Administered 2021-01-12 – 2021-01-14 (×6): 650 mg via ORAL
  Filled 2021-01-12 (×6): qty 2

## 2021-01-12 MED ORDER — LACTATED RINGERS IV SOLN: INTRAVENOUS | Status: DC

## 2021-01-12 MED ORDER — PROMETHAZINE HCL 25 MG PO TABS
12.5000 mg | ORAL_TABLET | Freq: Four times a day (QID) | ORAL | Status: DC | PRN
  Filled 2021-01-12 (×2): qty 1

## 2021-01-12 MED ORDER — PIPERACILLIN-TAZOBACTAM 3.375 G IVPB
3.3750 g | Freq: Three times a day (TID) | INTRAVENOUS | Status: AC
  Administered 2021-01-12 (×2): 3.375 g via INTRAVENOUS
  Filled 2021-01-12 (×2): qty 50

## 2021-01-12 NOTE — Progress Notes (Signed)
PHARMACY -  BRIEF ANTIBIOTIC NOTE   Pharmacy has received consult(s) for Vancomycin from an ED provider.  The patient's profile has been reviewed for ht/wt/allergies/indication/available labs.    One time order(s) placed for Vancomycin 2 gm IV X 1.   Further antibiotics/pharmacy consults should be ordered by admitting physician if indicated.                       Thank you, Donald Barry D 01/12/2021  3:17 AM

## 2021-01-12 NOTE — ED Notes (Addendum)
Dr. Marcello Moores notified pt. Hypoxic in the 70%'s while on nonrebreather and HR is 140's. Respiratory called to bedside, placed pt. On bipap.

## 2021-01-12 NOTE — Progress Notes (Signed)
Pharmacy Antibiotic Note  Donald Barry is a 75 y.o. male admitted on 01/12/2021 with  intra-abdominal infections .  Pharmacy has been consulted for Zosyn dosing.  Plan: Zosyn 3.375g IV q8h (4 hour infusion).  Height: 6' (182.9 cm) Weight: 90.7 kg (200 lb) IBW/kg (Calculated) : 77.6  Temp (24hrs), Avg:99.2 F (37.3 C), Min:98 F (36.7 C), Max:101.2 F (38.4 C)  Recent Labs  Lab 01/10/21 1946 01/11/21 1947  WBC 8.0 5.4  CREATININE 0.87 0.85  LATICACIDVEN  --  1.2    Estimated Creatinine Clearance: 83.7 mL/min (by C-G formula based on SCr of 0.85 mg/dL).    Allergies  Allergen Reactions   Tape Dermatitis    Skin blistered- steri strip    Wound Dressing Adhesive Dermatitis    Skin blistered- steri strip   Shellfish Allergy Diarrhea and Nausea And Vomiting    Shrimp    Shellfish-Derived Products Diarrhea, Nausea And Vomiting and Nausea Only   Onion     diarrhea   Other Nausea And Vomiting    general anesthesia   Codeine Nausea Only    Antimicrobials this admission:   >>    >>   Dose adjustments this admission:   Microbiology results:  BCx:   UCx:    Sputum:    MRSA PCR:   Thank you for allowing pharmacy to be a part of this patient's care.  Tahlor Berenguer D 01/12/2021 3:29 AM

## 2021-01-12 NOTE — Progress Notes (Signed)
PHARMACY - PHYSICIAN COMMUNICATION CRITICAL VALUE ALERT - BLOOD CULTURE IDENTIFICATION (BCID)  Donald Barry is an 75 y.o. male who presented to [facility] on 01/12/2021 with a chief complaint of flank pain  Assessment:  patient with recent urologic surgery (open cystectomy, ileal conduit) with both sets of blood cultures with GNR,BCID  detected E. Cloacae.  CT reveals pyelonephritis and pelvic abscess.   Name of physician (or Provider) Contacted: Dr Thereasa Solo  Current antibiotics: piperacillin/tazobactam  Changes to prescribed antibiotics recommended:  Recommendations accepted by provider - change to cefepime and metronidazole  Results for orders placed or performed during the hospital encounter of 01/12/21  Blood Culture ID Panel (Reflexed) (Collected: 01/12/2021  2:05 AM)  Result Value Ref Range   Enterococcus faecalis NOT DETECTED NOT DETECTED   Enterococcus Faecium NOT DETECTED NOT DETECTED   Listeria monocytogenes NOT DETECTED NOT DETECTED   Staphylococcus species NOT DETECTED NOT DETECTED   Staphylococcus aureus (BCID) NOT DETECTED NOT DETECTED   Staphylococcus epidermidis NOT DETECTED NOT DETECTED   Staphylococcus lugdunensis NOT DETECTED NOT DETECTED   Streptococcus species NOT DETECTED NOT DETECTED   Streptococcus agalactiae NOT DETECTED NOT DETECTED   Streptococcus pneumoniae NOT DETECTED NOT DETECTED   Streptococcus pyogenes NOT DETECTED NOT DETECTED   A.calcoaceticus-baumannii NOT DETECTED NOT DETECTED   Bacteroides fragilis NOT DETECTED NOT DETECTED   Enterobacterales DETECTED (A) NOT DETECTED   Enterobacter cloacae complex DETECTED (A) NOT DETECTED   Escherichia coli NOT DETECTED NOT DETECTED   Klebsiella aerogenes NOT DETECTED NOT DETECTED   Klebsiella oxytoca NOT DETECTED NOT DETECTED   Klebsiella pneumoniae NOT DETECTED NOT DETECTED   Proteus species NOT DETECTED NOT DETECTED   Salmonella species NOT DETECTED NOT DETECTED   Serratia marcescens NOT DETECTED NOT  DETECTED   Haemophilus influenzae NOT DETECTED NOT DETECTED   Neisseria meningitidis NOT DETECTED NOT DETECTED   Pseudomonas aeruginosa NOT DETECTED NOT DETECTED   Stenotrophomonas maltophilia NOT DETECTED NOT DETECTED   Candida albicans NOT DETECTED NOT DETECTED   Candida auris NOT DETECTED NOT DETECTED   Candida glabrata NOT DETECTED NOT DETECTED   Candida krusei NOT DETECTED NOT DETECTED   Candida parapsilosis NOT DETECTED NOT DETECTED   Candida tropicalis NOT DETECTED NOT DETECTED   Cryptococcus neoformans/gattii NOT DETECTED NOT DETECTED   CTX-M ESBL NOT DETECTED NOT DETECTED   Carbapenem resistance IMP NOT DETECTED NOT DETECTED   Carbapenem resistance KPC NOT DETECTED NOT DETECTED   Carbapenem resistance NDM NOT DETECTED NOT DETECTED   Carbapenem resist OXA 48 LIKE NOT DETECTED NOT DETECTED   Carbapenem resistance VIM NOT DETECTED NOT DETECTED   Doreene Eland, PharmD, BCPS.   Work Cell: [phone] 01/12/2021 1:52 PM

## 2021-01-12 NOTE — ED Notes (Signed)
Pt. Had coughing/ gagging fit while on bipap. RT at bedside, pt. Switched to NRB, and zofran order requested from Dr. Marcello Moores

## 2021-01-12 NOTE — ED Notes (Signed)
Pt is noted to be on 2MCG/min of LEVO

## 2021-01-12 NOTE — Progress Notes (Signed)
Pharmacy Antibiotic Note  DAYNA EIBEN is a 75 y.o. male admitted on 01/12/2021 with  intra-abdominal infections .  Pharmacy has been consulted for Zosyn dosing. CT reveals pyelonephritis and pelvic abscess s/p open cystectomy and ileal conduit  Today. 01/12/2021 Blood culture with E cloacae 8/31 Ucx E coli (sens pending), 9/2 Ucx pending Renal: SCr stable  Plan: Cefepime 2gm IV q8h Metronidazole '500mg'$  IV q12h (empiric anaerobic coverage for pelvic abscess) Monitor culture results and renal function F/u plan for possible drainage per IR in 2-5 days when repeat CT  Height: 6' (182.9 cm) Weight: 90.7 kg (200 lb) IBW/kg (Calculated) : 77.6  Temp (24hrs), Avg:100.7 F (38.2 C), Min:98 F (36.7 C), Max:104.1 F (40.1 C)  Recent Labs  Lab 01/10/21 1946 01/11/21 1947 01/12/21 0415 01/12/21 0617 01/12/21 0630 01/12/21 0736 01/12/21 1233  WBC 8.0 5.4 1.2* 3.8*  --   --  5.9  CREATININE 0.87 0.85 0.89  --   --   --   --   LATICACIDVEN  --  1.2 3.2*  --  7.3* 2.3*  --      Estimated Creatinine Clearance: 79.9 mL/min (by C-G formula based on SCr of 0.89 mg/dL).    Allergies  Allergen Reactions   Tape Dermatitis    Skin blistered- steri strip    Wound Dressing Adhesive Dermatitis    Skin blistered- steri strip   Shellfish Allergy Diarrhea and Nausea And Vomiting    Shrimp    Shellfish-Derived Products Diarrhea, Nausea And Vomiting and Nausea Only   Onion     diarrhea   Other Nausea And Vomiting    general anesthesia   Codeine Nausea Only    Antimicrobials this admission: --Vancomycin  2gm x 1 dose 9/2 @ 0345 --Zosyn 9/2>>9/2 --Cefepime 9/2 >>  Dose adjustments this admission:   Microbiology results: --9/2 bcx - E cloacae --9/2 Ucx -  --8/31 ucx - E.coli (sens pending)  Thank you for allowing pharmacy to be a part of this patient's care.  Doreene Eland, PharmD, BCPS.   Work Cell: [phone] 01/12/2021 2:01 PM

## 2021-01-12 NOTE — ED Notes (Signed)
Critical WBC count 1.2 Dr. Marcello Moores notified Dr. Marcello Moores notified pt's BP continues to trend downward.

## 2021-01-12 NOTE — Consult Note (Addendum)
Urology Consult   I have been asked to see the patient by Dr. Thereasa Solo, for evaluation and management of sepsis with history of recent cystectomy and urinary diversion and possible pelvic abscess and pyelonephritis.  Chief Complaint: Fever/chills  HPI:  Donald Barry is a 75 y.o. year old with end-stage interstitial cystitis who recently underwent open cystoprostatectomy and ileal conduit urinary diversion with Dr. Amalia Hailey at [facility] on 12/19/2020.  He was recently seen in clinic just yesterday and was doing well and his final ureteral stent was removed.  Overnight he developed fevers, chills, and malaise and presented to the ER.  He has had some mild drainage of non-foul-smelling reddish fluid from the penis since surgery, this has not increased.  He is having some mild left lower abdominal pain.  He continues to produce clear yellow urine in the urostomy.  His overall PO intake has decreased over the last week and he has not had much of an appetite.  He had severe constipation earlier in the week that has since resolved with enema.  CT showed possible early pelvic abscess, possible pyelonephritis, but no significant hydronephrosis.  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 SCCis   Squamous cell skin cancer 07/2017   Resected from scalp.    Surgical  History: Past Surgical History:  Procedure Laterality Date   BACK SURGERY  2008   L4, L5   COLONOSCOPY     CYSTO WITH HYDRODISTENSION N/A 06/17/2019   Procedure: CYSTOSCOPY/HYDRODISTENSION;  Surgeon: Billey Co, MD;  Location: [facility] ORS;  Service: Urology;  Laterality: N/A;   CYSTOSCOPY WITH BIOPSY N/A 07/17/2018   Procedure: CYSTOSCOPY WITH BLADDER BIOPSY;  Surgeon: Billey Co, MD;  Location: [facility] ORS;  Service: Urology;  Laterality: N/A;   CYSTOSCOPY WITH BIOPSY N/A 06/17/2019   Procedure: CYSTOSCOPY WITH BLADDER  BIOPSY;  Surgeon: Billey Co, MD;  Location: [facility] ORS;  Service: Urology;  Laterality: N/A;   CYSTOSCOPY WITH FULGERATION N/A 07/17/2018   Procedure: CYSTOSCOPY WITH FULGERATION;  Surgeon: Billey Co, MD;  Location: [facility] ORS;  Service: Urology;  Laterality: N/A;   CYSTOSCOPY WITH FULGERATION N/A 06/17/2019   Procedure: CYSTOSCOPY WITH FULGERATION;  Surgeon: Billey Co, MD;  Location: [facility] ORS;  Service: Urology;  Laterality: N/A;   ESOPHAGOGASTRODUODENOSCOPY     HERNIA REPAIR  0000000   umbilical   STOMACH SURGERY     FUNDIC GLAND POLYP     Allergies:  Allergies  Allergen Reactions   Tape Dermatitis    Skin blistered- steri strip    Wound Dressing Adhesive Dermatitis    Skin blistered- steri strip   Shellfish Allergy Diarrhea and Nausea And Vomiting    Shrimp    Shellfish-Derived Products Diarrhea, Nausea And Vomiting and Nausea Only  Onion     diarrhea   Other Nausea And Vomiting    general anesthesia   Codeine Nausea Only    Family History: Family History  Problem Relation Age of Onset   Hypertension Mother    GER disease Sister    GER disease Brother    Prostate cancer Neg Hx    Kidney cancer Neg Hx     Social History:  reports that he has never smoked. He has never used smokeless tobacco. He reports that he does not drink alcohol and does not use drugs.  ROS: Negative aside from those stated in the HPI.  Physical Exam: BP  121/75   Pulse 60   Temp (!) 101.8 F (38.8 C) (Rectal)   Resp (!) 22   Ht 6' (1.829 m)   Wt 90.7 kg   SpO2 99%   BMI 27.12 kg/m    Constitutional:  Alert and oriented, ill-appearing Cardiovascular: No clubbing, cyanosis, or edema. Respiratory: Normal respiratory effort, no increased work of breathing. GI: Abdomen is soft, mildly tender to deep palpation in the left lower quadrant, non-peritonitic, nondistended  Drains: Urostomy with clear yellow urine in right lower quadrant  Laboratory Data: Reviewed in epic Lactate 2.3(7.3)(3.2)(1.2) WBC 5.9(3.8)(1.2) Hematocrit 27(25)(23.5) Creatinine 0.9  Pertinent Imaging: I have personally reviewed the CT abdomen and pelvis with contrast from today showing complex loculated collection with fluid and air in the cystectomy bed possibly representing early abscess, mild hydronephrosis consistent with recent ileal conduit diversion and reflux.  Assessment & Plan:   75 year old male post open cystoprostatectomy and ileal conduit urinary diversion for end-stage interstitial cystitis with Dr. Amalia Hailey at [facility] on 12/19/2020, admitted with 12 hours of fever/chills and malaise likely secondary to either pyelonephritis after stent removal versus early pelvic abscess.  I discussed his case with the on-call urologist at [facility], as well as the interventional radiologist at [facility] Dr. Maryelizabeth Kaufmann.  We are all in agreement that the pelvic abscesses still early and not amenable to immediate drainage at this time, but further imaging with CT likely warranted in the next few days to evaluate change in this collection and re- consider percutaneous drainage with interventional radiology.  Agree with admission to hospitalist for aggressive resuscitation and broad-spectrum antibiotics.  I saw him again this afternoon and he clinically has improved significantly as well as based on objective lab work including lactate, WBC, and vital  signs.  Recommendations:  -Continue broad-spectrum antibiotics and admission, follow-up cultures  -Consider repeat CT abdomen and pelvis with contrast in 2-5 days to evaluate if pelvic abscess more amenable to percutaneous drainage with interventional radiology  -If clinically improved significantly over the next 24 hours, could consider discharge on broad-spectrum antibiotics and repeat CT at [facility] and close follow-up with them as outpatient  -Recommendations were communicated to hospitalist Dr. Dorette Grate, MD  Total time spent on the floor was 120 minutes, with greater than 50% spent in counseling and coordination of care with the patient regarding complications from recent cystectomy and ileal conduit including pelvic abscess and possible pyelonephritis, possible need for interventional radiology percutaneous drainage in the future during this hospitalization, and admission for antibiotics and resuscitation.  [place] [address], [place], [place] [address] [phone]

## 2021-01-12 NOTE — Progress Notes (Signed)
CODE SEPSIS - PHARMACY COMMUNICATION  **Broad Spectrum Antibiotics should be administered within 1 hour of Sepsis diagnosis**  Time Code Sepsis Called/Page Received:  9/2 @ 0311  Antibiotics Ordered: ceftriaxone , Vancomycin   Time of 1st antibiotic administration: Ceftriaxone 1 gm on 9/2 @ 0223.   Additional action taken by pharmacy:   If necessary, Name of Provider/Nurse Contacted:     Kaelin Holford D ,PharmD Clinical Pharmacist  01/12/2021  3:19 AM

## 2021-01-12 NOTE — ED Notes (Addendum)
Sharion Settler, NP notified pf pt's repeat rectal temp.

## 2021-01-12 NOTE — Consult Note (Addendum)
NAME:  Donald Barry, MRN:  [id number], DOB:  [birth year], LOS: 0 ADMISSION DATE:  01/12/2021, CONSULTATION DATE: 01/12/2021  REFERRING MD: Dr. Marcello Moores, CHIEF COMPLAINT: Hypotension   History of Present Illness:  This is a 75 yo male who presented to [facility] ER on 09/1 with left flank/lower abdominal pain, fever, and nausea.  He was previously seen in the ER on 08/31 with c/o constipation, nausea, and loss of appetite onset 08/28.  During previous ER visit he received soapsuds enema, 100 mg of colace, and lactulose which resulted in pt having a bowel movement.  His UA was positive for UTI.  However, pt not symptomatic at that time and discharged from the ER and instructed to follow-up with his urologist on 09/1.  Pt developed worsening symptoms on 09/1 prompting return to the ER.  He underwent cystectomy with ileal conduit diversion L1 and RLQ urostomy which drained clear yellow urine on 08/8 due to interstitial cystitis at [facility].  He was discharged from [facility] PACU on 08/8 and prescribed Bactrim 1 tab bid for 7 days; Oxycodone 5 mg immediate release q6hrs prn for pain; and acetaminophen 1000 mg q6hrs prn for pain.    Current ED course  Upon arrival to the ER pt febrile temp 101.2 degrees F.  Lab results revealed Na+ 133, CO2 20, glucose 111, pct 15.10, hgb 9.1, vbg 7.23/acid-base deficit 8.7/bicarb 18.4 and respiratory panel by RT-PCR negative.  CT Abd Pelvis concerning for complex loculated collection containing fluid and air at the cystectomy bed consistent with developing abscess; and mild bilateral hydronephrosis with findings of pyelonephritis.  Pt did not meet criteria for severe sepsis or septic shock at that time.  Per ER physician pt received a total of 2 grams of IV ceftriaxone and vancomycin.  ER physician contacted on call urologist Dr. Diamantina Providence who recommended transfer to [facility] due to recent cystectomy performed at facility, however pt and pts wife  insisted pt stay at [facility] for treatment.  He was subsequently admitted to the stepdown unit per hospitalist team for additional workup and treatment.    While in the ER on 09/2 pt developed hypoxia with O2 sats 70% while on NRB with hr 140's, therefore he was placed on Bipap.  CXR revealed no active disease.  Lab results revealed wbc 3.8, hgb 7.7, lactic acid 7.3, and iron 8/saturation ratios 3.  Pt also developed hypotension map 63 and became febrile temp 101.1 degrees F rectally.  He was placed on peripheral levophed gtt.  PCCM consulted to assist management.  Urologist Dr. Diamantina Providence evaluated pt with recommendations to keep pt NPO until decision made regarding possible interventional radiology drain at [facility] vs. transfer to [facility].    Pertinent  Medical History  Actinic Keratosis  Seasonal Allergic Rhinitis  Arthritis Chronic Necl Pain  GERD  Hepatic Steatosis  Hiatal Hernia  OSA~wears CPAP Squamous Cell Carcinoma of Scalp and Skin   Significant Hospital Events: Including procedures, antibiotic start and stop dates in addition to other pertinent events   Pt admitted 09/2 to the stepdown unit with pyelonephritis vs. early pelvic abscess, however he remained in the ER pending bed availability  CT Abd/Pelvis 09/2>>Status post cystectomy with right lower quadrant ileal conduit. There is a complex loculated collection containing fluid and air at the cystectomy bed most consistent with developing abscess. Mild bilateral hydronephrosis with findings of pyelonephritis. Correlation with urinalysis recommended. Sigmoid diverticulosis. No bowel obstruction. Normal appendix. Cholelithiasis. Aortic Atherosclerosis (ICD10-I70.0).  On 09/2 pt developed acute hypoxic respiratory failure and  hypotension requiring brief period of Bipap and peripheral levophed gtt PCCM consulted to assist with management.   Urology evaluated pt recommended possible interventional radiology drain at [facility] vs. transfer to [place]:  Blood x2 09/2>>negative  MRSA PCR 09/2>>negative  COVID-19 09/2>>negative  Influenza PCR 09/2>>negative  Urine 09/2>>  Antimicrobials   Ceftriaxone 09/2 x2 doses  Zosyn 09/2>>  Interim History / Subjective:  Pt currently on 10L O2 via HFNC with O2 sats 98-99% without s/sx of respiratory distress.  Pt c/o 1/10 left flank pain.  Currently on 2 mcg/min of levophed gtt with map >65  Objective   Blood pressure (!) 94/56, pulse 87, temperature (!) 101.8 F (38.8 C), temperature source Rectal, resp. rate (!) 28, height 6' (1.829 m), weight 90.7 kg, SpO2 99 %.        Intake/Output Summary (Last 24 hours) at 01/12/2021 [id number] Last data filed at 01/12/2021 [id number] Gross per 24 hour  Intake 2908.48 ml  Output 200 ml  Net 2708.48 ml   Filed Weights   01/11/21 1941  Weight: 90.7 kg    Examination: General: acutely ill appearing male, resting in bed NAD  HENT: supple, no JVD  Lungs: clear throughout, even, non labored  Cardiovascular: nsr, rrr, no R/G, 2+ radial/2+ distal pulses, no edema  Abdomen: +BS x4, soft, non distended, left flank pain present, RLQ urostomy draining yellow urine; midline healing abdominal incision  Extremities: normal bulk and tone Neuro: alert and oriented, follows commands, PERRLA  GU: RLQ urostomy draining clear yellow urine   Resolved Hospital Problem list   N/A  Assessment & Plan:   Acute hypoxic respiratory failure likely secondary to atelectasis and metabolic acidosis~improving  Hx: OSA - Supplemental O2 for dyspnea and/or hypoxia  - CPAP qhs  - Aggressive pulmonary hygiene  - prn CXR   Sepsis with septic shock secondary to pyelonephritis vs. early pelvic abscess Recent cystectomy with ileal conduit diversion L1 and RLQ urostomy placement~performed at [facility] on 12/18/20 - Continuous telemetry monitoring  - Aggressive fluid resuscitation and prn levophed gtt to maintain map >65 - Urology consulted appreciate  input~recommending possible interventional radiology drain vs. transfer to [place] [facility] consulted appreciate input  - Trend WBC and monitor fever curve  - Trend PCT  - Follow cultures  - Continue zosyn   Metabolic and lactic acidosis  - Trend BMP and prn VBG  - Replace electrolytes as indicated  - Monitor UOP  Iron deficiency anemia  - Trend CBC  - Monitor for s/sx of bleeding and transfuse for hgb <7 - Consider iron supplementation   Best Practice (right click and "Reselect all SmartList Selections" daily)   Diet/type: NPO DVT prophylaxis: SCD GI prophylaxis: H2B Lines: N/A Foley: RLQ Urostomy  Code Status:  full code Last date of multidisciplinary goals of care discussion [09/2]  Labs   CBC: Recent Labs  Lab 01/10/21 1946 01/11/21 1947 01/12/21 0415 01/12/21 0617  WBC 8.0 5.4 1.2* 3.8*  NEUTROABS  --   --  1.1* 3.7  HGB 10.1* 9.1* 7.9* 7.7*  HCT 30.8* 26.8* 23.6* 23.5*  MCV 89.5 85.9 88.4 89.0  PLT 313 231 177 0000000    Basic Metabolic Panel: Recent Labs  Lab 01/10/21 1946 01/11/21 1947 01/12/21 0415  NA 137 133* 136  K 4.2 3.8 3.9  CL 104 104 106  CO2 23 20* 21*  GLUCOSE 102* 111* 96  BUN '11 11 11  '$ CREATININE 0.87 0.85 0.89  CALCIUM 9.1 8.5* 8.1*   GFR: Estimated Creatinine Clearance: 79.9 mL/min (by C-G formula based on SCr of 0.89 mg/dL). Recent Labs  Lab 01/10/21 1946 01/11/21 1947 01/12/21 0214 01/12/21 0415 01/12/21 0617 01/12/21 0630  PROCALCITON  --   --  15.10  --   --   --   WBC 8.0 5.4  --  1.2* 3.8*  --   LATICACIDVEN  --  1.2  --  3.2*  --  7.3*    Liver Function Tests: Recent Labs  Lab 01/10/21 1946 01/11/21 1947 01/12/21 0415  AST '21 21 22  '$ ALT '15 14 12  '$ ALKPHOS 33* 30* 25*  BILITOT 0.7 1.0 0.7  PROT 6.6 5.9* 5.2*  ALBUMIN 3.6 3.1* 2.6*   Recent Labs  Lab 01/10/21 1946  LIPASE 37   No results for input(s): AMMONIA in the last 168 hours.  ABG    Component Value Date/Time   PHART  7.44 01/12/2021 0426   PCO2ART 30 (L) 01/12/2021 0426   PO2ART 144 (H) 01/12/2021 0426   HCO3 20.4 01/12/2021 0426   ACIDBASEDEF 3.2 (H) 01/12/2021 0426   O2SAT 99.3 01/12/2021 0426     Coagulation Profile: Recent Labs  Lab 01/12/21 0415  INR 1.4*    Cardiac Enzymes: No results for input(s): CKTOTAL, CKMB, CKMBINDEX, TROPONINI in the last 168 hours.  HbA1C: No results found for: HGBA1C  CBG: No results for input(s): GLUCAP in the last 168 hours.  Review of Systems: Positives in BOLD   Gen: fever, chills, weight change, fatigue, night sweats HEENT: Denies blurred vision, double vision, hearing loss, tinnitus, sinus congestion, rhinorrhea, sore throat, neck stiffness, dysphagia PULM: Denies shortness of breath, cough, sputum production, hemoptysis, wheezing CV: Denies chest pain, edema, orthopnea, paroxysmal nocturnal dyspnea, palpitations GI:  left flank pain, nausea, vomiting, diarrhea, hematochezia, melena, constipation, change in bowel habits GU: Denies dysuria, hematuria, polyuria, oliguria, urethral discharge Endocrine: Denies hot or cold intolerance, polyuria, polyphagia or appetite change Derm: Denies rash, dry skin, scaling or peeling skin change Heme: Denies easy bruising, bleeding, bleeding gums Neuro: Denies headache, numbness, weakness, slurred speech, loss of memory or consciousness  Past Medical History:  He,  has a past medical history of Actinic keratosis, Allergic rhinitis, seasonal (06/14/2014), Allergy, Arthritis, Chronic neck pain (0000000), Complication of anesthesia, Gastroesophageal reflux disease without esophagitis (06/14/2014), Headache, Hepatic steatosis (04/05/2018), Hiatal hernia (09/09/2017), Intermittent left lower quadrant abdominal pain (07/15/2017), PONV (postoperative nausea and vomiting), Sleep apnea, Squamous cell carcinoma of scalp (09/12/2017), Squamous cell carcinoma of skin (12/22/2019), and Squamous cell skin cancer (07/2017).   Surgical  History:   Past Surgical History:  Procedure Laterality Date   BACK SURGERY  2008   L4, L5   COLONOSCOPY     CYSTO WITH HYDRODISTENSION N/A 06/17/2019   Procedure: CYSTOSCOPY/HYDRODISTENSION;  Surgeon: Billey Co, MD;  Location: [facility] ORS;  Service: Urology;  Laterality: N/A;   CYSTOSCOPY WITH BIOPSY N/A 07/17/2018   Procedure: CYSTOSCOPY WITH BLADDER BIOPSY;  Surgeon: Billey Co, MD;  Location: [facility] ORS;  Service: Urology;  Laterality: N/A;   CYSTOSCOPY WITH BIOPSY N/A 06/17/2019   Procedure: CYSTOSCOPY WITH BLADDER  BIOPSY;  Surgeon: Billey Co, MD;  Location: [facility] ORS;  Service: Urology;  Laterality: N/A;   CYSTOSCOPY WITH FULGERATION N/A 07/17/2018   Procedure: CYSTOSCOPY WITH FULGERATION;  Surgeon: Billey Co, MD;  Location: [facility] ORS;  Service: Urology;  Laterality: N/A;   CYSTOSCOPY WITH  FULGERATION N/A 06/17/2019   Procedure: CYSTOSCOPY WITH FULGERATION;  Surgeon: Billey Co, MD;  Location: [facility] ORS;  Service: Urology;  Laterality: N/A;   ESOPHAGOGASTRODUODENOSCOPY     HERNIA REPAIR  0000000   umbilical   STOMACH SURGERY     FUNDIC GLAND POLYP     Social History:   reports that he has never smoked. He has never used smokeless tobacco. He reports that he does not drink alcohol and does not use drugs.   Family History:  His family history includes GER disease in his brother and sister; Hypertension in his mother. There is no history of Prostate cancer or Kidney cancer.   Allergies Allergies  Allergen Reactions   Tape Dermatitis    Skin blistered- steri strip    Wound Dressing Adhesive Dermatitis    Skin blistered- steri strip   Shellfish Allergy Diarrhea and Nausea And Vomiting    Shrimp    Shellfish-Derived Products Diarrhea, Nausea And Vomiting and Nausea Only   Onion     diarrhea   Other Nausea And Vomiting    general anesthesia   Codeine Nausea Only     Home Medications  Prior to Admission medications   Medication Sig Start Date End Date  Taking? Authorizing Provider  acetaminophen (TYLENOL) 500 MG tablet Take 1,000 mg by mouth every 6 (six) hours as needed for pain. 12/23/20   [provider]  amitriptyline (ELAVIL) 50 MG tablet Take 1 tablet (50 mg total) by mouth at bedtime. 12/31/19   Nori Riis, PA-C  aspirin EC 81 MG tablet Take 81 mg by mouth daily.     [provider]  atorvastatin (LIPITOR) 10 MG tablet Take 10 mg by mouth daily. 08/06/19   [provider]  cimetidine (TAGAMET) 400 MG tablet Take 400 mg by mouth 2 (two) times daily. Breakfast & supper    [provider]  dextromethorphan-guaiFENesin (MUCINEX DM) 30-600 MG 12hr tablet Take 1 tablet by mouth at bedtime.    [provider]  ELMIRON 100 MG capsule Take 100 mg by mouth 3 (three) times daily. 04/25/20   [provider]  fluticasone (FLONASE) 50 MCG/ACT nasal spray Place 1 spray into the nose at bedtime.     [provider]  fluticasone (FLONASE) 50 MCG/ACT nasal spray Place 1 spray into the nose at bedtime.    [provider]  hydrocortisone 2.5 % cream Apply topically 2 (two) times daily. Up to 2 weeks at underarms Patient not taking: Reported on 05/22/2020 03/23/20   Laurence Ferrari, [place], MD  hydrOXYzine (ATARAX/VISTARIL) 25 MG tablet Take 25 mg by mouth daily. 03/30/20   [provider]  ibuprofen (ADVIL) 200 MG tablet Take 400 mg by mouth daily as needed for moderate pain.     [provider]  ibuprofen (ADVIL) 200 MG tablet Take 200 mg by mouth every hour as needed.    [provider]  loratadine (ALAVERT) 10 MG dissolvable tablet Take 10 mg by mouth daily.     [provider]  Menthol, Topical Analgesic, (BLUE-EMU MAXIMUM STRENGTH EX) Apply 1 application topically 4 (four) times daily as needed (joint pain).     [provider]  montelukast (SINGULAIR) 10 MG tablet TAKE 1 TABLET BY MOUTH EVERY DAY AT NIGHT 12/16/19   [provider]   Multiple Vitamin (MULTIVITAMIN WITH MINERALS) TABS tablet Take 1 tablet by mouth daily. Iron Free    [provider]  Multiple Vitamins-Minerals (CENTRUM FRESH/FRUITY 50+) CHEW  Chew 1 tablet by mouth daily.    [provider]  mupirocin ointment (BACTROBAN) 2 % Apply 1 application topically daily. To open wound at right ear Patient not taking: Reported on 05/22/2020 03/23/20   Laurence Ferrari, [place], MD  omeprazole (PRILOSEC) 40 MG capsule Take 40 mg by mouth daily before breakfast.     [provider]  omeprazole (PRILOSEC) 40 MG capsule Take 40 mg by mouth 2 (two) times daily. 10/25/19   [provider]  ondansetron (ZOFRAN) 4 MG tablet Take 4 mg by mouth every 8 (eight) hours as needed. 12/23/20   [provider]  oxyCODONE (OXY IR/ROXICODONE) 5 MG immediate release tablet Take 5 mg by mouth 4 (four) times daily as needed. 12/23/20   [provider]  Sodium Chloride-Sodium Bicarb (NETI POT SINUS [place] NA) Place 1 Dose into the nose at bedtime as needed (congestion).    [provider]  tamsulosin (FLOMAX) 0.4 MG CAPS capsule Take 0.4 mg by mouth daily. 03/14/20   [provider]   Updated pt regarding plan of care and all questions were answered   Critical care time: 40 minutes     Rosilyn Mings, [place] Pager [phone] (please enter 7 digits) PCCM Consult Pager [phone] (please enter 7 digits)

## 2021-01-12 NOTE — Progress Notes (Signed)
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  His constipation was corrected while he was in the ED and he was discharged from the ED to see his Urologist that same day.  Hours later he returned to the ED with complaints of fevers chills and suprapubic pain.  In the ED evaluation revealed evidence of bilateral pyelonephritis with developing perinephric abscess.  Transfer back to [facility] was recommended but patient insisted on being admitted to [facility].  Significant Events:  9/2 admit via ED  Consultants:  Urology  PCCM   Code Status: FULL CODE  Antimicrobials:  Rocephin 9/1 Zosyn 9/1 > Vancomycin 9/1 >  DVT prophylaxis: Lovenox  Subjective: The patient was examined and interviewed by one of my partners earlier today.  He was seen for follow-up visit while still in the ER.  Assessment & Plan:  Sepsis POA due to acute pyelonephritis Cont volume resuscitation and pressor support as indicated   Status post cystectomy with ileal conduit creation 8/22 with possible cystectomy bed abscess Care per Urology   Acute hypoxic respiratory failure Unclear etiology - does not appear to be a parenchymal issue - perhaps simply hypoventilation due to sepsis / lethargy? - weaning O2 support   Normocytic anemia  Hyponatremia  GERD  NAFLD    Family Communication: no family present at time of exam  Status is: Inpatient  Remains inpatient appropriate because:Inpatient level of care appropriate due to severity of illness  Dispo: The patient is from: Home              Anticipated d/c is to: Home              Patient currently is not medically stable to d/c.   Difficult to place patient  No   Objective: Blood pressure (!) 94/56, pulse 87, temperature (!) 101.8 F (38.8 C), temperature source Rectal, resp. rate (!) 28, height 6' (1.829 m), weight 90.7 kg, SpO2 99 %.  Intake/Output Summary (Last 24 hours) at 01/12/2021 0731 Last data filed at 01/12/2021 [id number] Gross per 24 hour  Intake 2908.48 ml  Output 200 ml  Net 2708.48 ml   Filed Weights   01/11/21 1941  Weight: 90.7 kg    Examination: The patient was seen for a follow-up exam.  CBC: Recent Labs  Lab 01/11/21 1947 01/12/21 0415 01/12/21 0617  WBC 5.4 1.2* 3.8*  NEUTROABS  --  1.1* 3.7  HGB 9.1* 7.9* 7.7*  HCT 26.8* 23.6* 23.5*  MCV 85.9 88.4 89.0  PLT 231 177 0000000   Basic Metabolic Panel: Recent Labs  Lab 01/10/21 1946 01/11/21 1947 01/12/21 0415  NA 137 133* 136  K 4.2 3.8 3.9  CL 104 104 106  CO2 23 20* 21*  GLUCOSE 102* 111* 96  BUN '11 11 11  '$ CREATININE 0.87 0.85 0.89  CALCIUM 9.1 8.5* 8.1*   GFR: Estimated Creatinine Clearance: 79.9 mL/min (by C-G formula based on SCr of 0.89 mg/dL).  Liver Function Tests: Recent Labs  Lab 01/10/21 1946 01/11/21 1947 01/12/21 0415  AST '21 21 22  '$ ALT '15 14 12  '$ ALKPHOS 33* 30* 25*  BILITOT 0.7 1.0 0.7  PROT 6.6 5.9* 5.2*  ALBUMIN 3.6 3.1* 2.6*   Recent Labs  Lab 01/10/21 1946  LIPASE 37     Coagulation Profile: Recent Labs  Lab 01/12/21 0415  INR 1.4*     Recent Results (from the past 240 hour(s))  Resp Panel by RT-PCR (Flu A&B, Covid) Nasopharyngeal Swab     Status: None   Collection Time: 01/12/21 12:55 AM   Specimen: Nasopharyngeal Swab; Nasopharyngeal(NP) swabs in vial transport medium  Result Value Ref Range Status   SARS Coronavirus 2 by RT PCR NEGATIVE NEGATIVE Final    Comment: (NOTE) SARS-CoV-2 target nucleic acids are NOT DETECTED.  The SARS-CoV-2 RNA is generally detectable in upper respiratory specimens during the acute phase of infection. The lowest concentration of SARS-CoV-2 viral copies this assay can detect  is 138 copies/mL. A negative result does not preclude SARS-Cov-2 infection and should not be used as the sole basis for treatment or other patient management decisions. A negative result may occur with  improper specimen collection/handling, submission of specimen other than nasopharyngeal swab, presence of viral mutation(s) within the areas targeted by this assay, and inadequate number of viral copies(<138 copies/mL). A negative result must be combined with clinical observations, patient history, and epidemiological information. The expected result is Negative.  Fact Sheet for Patients:  EntrepreneurPulse.com.au  Fact Sheet for Healthcare Providers:  IncredibleEmployment.be  This test is no t yet approved or cleared by the [place] FDA and  has been authorized for detection and/or diagnosis of SARS-CoV-2 by FDA under an Emergency Use Authorization (EUA). This EUA will remain  in effect (meaning this test can be used) for the duration of the COVID-19 declaration under Section 564(b)(1) of the Act, 21 U.S.C.section 360bbb-3(b)(1), unless the authorization is terminated  or revoked sooner.       Influenza A by PCR NEGATIVE NEGATIVE Final   Influenza B by PCR NEGATIVE NEGATIVE Final    Comment: (NOTE) The Xpert Xpress SARS-CoV-2/FLU/RSV plus assay is intended as an aid in the diagnosis of influenza from Nasopharyngeal swab specimens and should not be used as a sole basis for treatment. Nasal washings and aspirates are unacceptable for Xpert Xpress SARS-CoV-2/FLU/RSV testing.  Fact Sheet for Patients: EntrepreneurPulse.com.au  Fact Sheet for Healthcare Providers: IncredibleEmployment.be  This test is not yet approved or cleared by the [place] FDA and has been authorized for detection and/or diagnosis of SARS-CoV-2 by FDA under an Emergency Use Authorization (EUA). This EUA will remain in effect  (meaning this test can be used) for the duration of the COVID-19 declaration under Section 564(b)(1) of the Act, 21 U.S.C. section 360bbb-3(b)(1), unless the authorization is terminated or revoked.  Performed at [facility], [address] [place], [place] [address]   Blood Culture (routine x 2)     Status: None (Preliminary result)   Collection Time: 01/12/21  2:05 AM   Specimen: BLOOD  Result Value Ref Range Status   Specimen Description BLOOD RIGHT ASSIST CONTROL  Final   Special Requests   Final    BOTTLES DRAWN AEROBIC AND ANAEROBIC Blood Culture adequate volume   Culture   Final    NO GROWTH < 12 HOURS Performed at [facility], [address] [place], [place] [address]    Report Status PENDING  Incomplete  Blood Culture (routine x 2)     Status: None (Preliminary result)   Collection Time: 01/12/21  2:20 AM   Specimen: BLOOD  Result Value Ref Range Status   Specimen Description BLOOD LEFT ASSIST CONTROL  Final   Special Requests  Final    BOTTLES DRAWN AEROBIC AND ANAEROBIC Blood Culture results may not be optimal due to an excessive volume of blood received in culture bottles   Culture   Final    NO GROWTH < 12 HOURS Performed at [facility], [address] [place], [place] [address]    Report Status PENDING  Incomplete  MRSA Next Gen by PCR, Nasal     Status: None   Collection Time: 01/12/21  4:15 AM   Specimen: Nasal Mucosa; Nasal Swab  Result Value Ref Range Status   MRSA by PCR Next Gen NOT DETECTED NOT DETECTED Final    Comment: (NOTE) The GeneXpert MRSA Assay (FDA approved for NASAL specimens only), is one component of a comprehensive MRSA colonization surveillance program. It is not intended to diagnose MRSA infection nor to guide or monitor treatment for MRSA infections. Test performance is not FDA approved in patients less than 23 years old. Performed at [facility], [address] [place], [place]  [id number]      Scheduled Meds:  enoxaparin (LOVENOX) injection  40 mg Subcutaneous Q24H   sodium chloride flush  3 mL Intravenous Q12H   Continuous Infusions:  sodium chloride     albumin human     lactated ringers 100 mL/hr at 01/12/21 0409   norepinephrine (LEVOPHED) Adult infusion     piperacillin-tazobactam (ZOSYN)  IV 3.375 g (01/12/21 0540)     LOS: 0 days   Cherene Altes, MD [facility] Office  [phone] Pager - Text Page per Shea Evans  If 7PM-7AM, please contact night-coverage per Amion 01/12/2021, 7:31 AM

## 2021-01-12 NOTE — Sepsis Progress Note (Signed)
Monitoring for code sepsis protocol. 

## 2021-01-12 NOTE — ED Provider Notes (Signed)
[facility] Emergency Department Provider Note  ____________________________________________   Event Date/Time   First MD Initiated Contact with Patient 01/12/21 0031     (approximate)  I have reviewed the triage vital signs and the nursing notes.   HISTORY  Chief Complaint Fever and Abdominal Pain    HPI Donald Barry is a 75 y.o. male with medical history as listed below which notably includes cystectomy with diverting urostomy about a month ago at [facility].  I saw him last night for constipation and he was discharged feeling well to follow-up with his urologist about 4 and half hours after discharge last night.  Who presents today for evaluation of fever, general malaise, and suprapubic pain.  He had a good visit with his urologist.  They talked about the urinalysis obtained yesterday which showed nitrite positive results with bacteria and large leukocytes but no WBCs or RBCs.  That urologist recommended against treatment and I had not started him on antibiotics since he had such a close follow-up, commenting as I did yesterday that the patient would always have bacteria in his urostomy bag.  However after he left at the urologist office, rather rapidly over the course of the day he started to have aching and sharp suprapubic pain and started to feel worse and worse all over.  He began having chills/rigors and had a fever of greater than 101 when he arrived at the emergency department.  He denies nausea and vomiting but has not had much of an appetite.  He denies chest pain, shortness of breath, and cough.  Nothing in particular makes his symptoms better or worse but he has rapidly been feeling worse over the course of the day.     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 SCCis   Squamous cell skin cancer 07/2017   Resected from scalp.    Patient Active Problem List   Diagnosis Date Noted   Interstitial cystitis 03/04/2019   Obstructive sleep apnea 12/29/2018   Thrombocytopenia ([place]) 09/30/2018   Aortic atherosclerosis ([place]) 07/13/2018   Hepatic steatosis 04/05/2018   Hiatal hernia 09/09/2017   Hx of adenomatous colonic polyps 07/15/2017   Intermittent left lower quadrant abdominal pain 07/15/2017   Allergic rhinitis, seasonal 06/14/2014   Chronic neck pain 06/14/2014   Gastroesophageal reflux disease without esophagitis 06/14/2014    Past Surgical History:  Procedure Laterality Date   BACK SURGERY  2008   L4, L5   COLONOSCOPY     CYSTO WITH HYDRODISTENSION N/A 06/17/2019   Procedure: CYSTOSCOPY/HYDRODISTENSION;  Surgeon: Billey Co, MD;  Location: [facility] ORS;  Service: Urology;  Laterality: N/A;   CYSTOSCOPY WITH BIOPSY N/A 07/17/2018   Procedure: CYSTOSCOPY WITH BLADDER BIOPSY;  Surgeon: Billey Co, MD;  Location: [facility] ORS;  Service: Urology;  Laterality: N/A;   CYSTOSCOPY WITH BIOPSY N/A 06/17/2019   Procedure: CYSTOSCOPY WITH BLADDER  BIOPSY;  Surgeon: Billey Co, MD;  Location: [facility] ORS;  Service: Urology;  Laterality: N/A;   CYSTOSCOPY WITH FULGERATION N/A 07/17/2018   Procedure: CYSTOSCOPY WITH FULGERATION;  Surgeon: Billey Co, MD;  Location: [facility] OR[facility];  Service: Urology;  Laterality: N/A;   CYSTOSCOPY WITH FULGERATION N/A 06/17/2019   Procedure: CYSTOSCOPY WITH FULGERATION;  Surgeon: Billey Co, MD;  Location: [facility] OR[facility];  Service: Urology;  Laterality: N/A;   ESOPHAGOGASTRODUODENOSCOPY     HERNIA REPAIR  0000000   umbilical   STOMACH SURGERY     FUNDIC GLAND POLYP    Prior to Admission medications   Medication Sig Start Date End Date Taking? Authorizing Provider  amitriptyline (ELAVIL) 50 MG tablet Take 1 tablet (50 mg total) by mouth at bedtime. 12/31/19   Nori Riis, PA-C  aspirin EC 81 MG tablet Take 81 mg by mouth daily.     [provider]  atorvastatin (LIPITOR) 10 MG tablet Take 10 mg by mouth daily. 08/06/19   [provider]  cimetidine (TAGAMET) 400 MG tablet Take 400 mg by mouth 2 (two) times daily. Breakfast & supper    [provider]  ELMIRON 100 MG capsule Take 100 mg by mouth 3 (three) times daily. 04/25/20   [provider]  fluticasone (FLONASE) 50 MCG/ACT nasal spray Place 1 spray into the nose at bedtime.     [provider]  hydrocortisone 2.5 % cream Apply topically 2 (two) times daily. Up to 2 weeks at underarms Patient not taking: Reported on 05/22/2020 03/23/20   Laurence Ferrari, [place], MD  hydrOXYzine (ATARAX/VISTARIL) 25 MG tablet Take 25 mg by mouth daily. 03/30/20   [provider]  ibuprofen (ADVIL) 200 MG tablet Take 400 mg by mouth daily as needed for moderate pain.     [provider]  loratadine (ALAVERT) 10 MG dissolvable tablet Take 10 mg by mouth daily.     [provider]  Menthol, Topical Analgesic, (BLUE-EMU MAXIMUM STRENGTH EX) Apply 1 application topically 4 (four) times daily as needed (joint pain).     [provider]  montelukast (SINGULAIR) 10 MG tablet TAKE 1 TABLET BY MOUTH EVERY DAY AT NIGHT 12/16/19   [provider]  Multiple Vitamin (MULTIVITAMIN WITH MINERALS) TABS tablet Take 1 tablet by mouth daily. Iron Free    [provider]  mupirocin ointment (BACTROBAN) 2 % Apply 1 application topically daily. To open wound at right  ear Patient not taking: Reported on 05/22/2020 03/23/20   Laurence Ferrari, [place], MD  omeprazole (PRILOSEC) 40 MG capsule Take 40 mg by mouth daily before breakfast.     [provider]  Sodium Chloride-Sodium Bicarb (NETI POT SINUS [place] NA) Place 1 Dose into the nose at bedtime as needed (congestion).    [provider]  tamsulosin (FLOMAX) 0.4 MG CAPS capsule Take 0.4 mg by mouth daily. 03/14/20   [provider]    Allergies Tape, Wound dressing adhesive, Shellfish allergy, Shellfish-derived products, Onion, Other, and Codeine  Family History  Problem Relation Age of Onset   Hypertension Mother    GER disease Sister    GER disease Brother    Prostate cancer Neg Hx    Kidney cancer Neg Hx     Social History Social History   Tobacco Use   Smoking status: Never   Smokeless tobacco: Never  Vaping Use  Vaping Use: Never used  Substance Use Topics   Alcohol use: Never   Drug use: Never    Review of Systems Constitutional: Positive for fever and rigors. Eyes: No visual changes. ENT: No sore throat. Cardiovascular: Denies chest pain. Respiratory: Denies shortness of breath. Gastrointestinal: Positive for suprapubic abdominal pain. Genitourinary: Negative for dysuria. Musculoskeletal: Negative for neck pain.  Negative for back pain. Integumentary: Negative for rash. Neurological: Negative for headaches, focal weakness or numbness.   ____________________________________________   PHYSICAL EXAM:  VITAL SIGNS: ED Triage Vitals  Enc Vitals Group     BP 01/11/21 1940 107/61     Pulse Rate 01/11/21 1940 (!) 112     Resp 01/11/21 1940 20     Temp 01/11/21 1940 (!) 101.2 F (38.4 C)     Temp Source 01/11/21 1940 Oral     SpO2 01/11/21 1919 96 %     Weight 01/11/21 1941 90.7 kg (200 lb)     Height 01/11/21 1941 1.829 m (6')     Head Circumference --      Peak Flow --      Pain Score 01/11/21 1941 4     Pain Loc --      Pain Edu? --      Excl. in  [place]? --     Constitutional: Alert and oriented.  Appears more comfortable than he did yesterday. Eyes: Conjunctivae are normal.  Head: Atraumatic. Nose: No congestion/rhinnorhea. Mouth/Throat: Patient is wearing a mask. Neck: No stridor.  No meningeal signs.   Cardiovascular: Normal rate, regular rhythm. Good peripheral circulation. Respiratory: Normal respiratory effort.  No retractions. Gastrointestinal: Soft and nondistended.  Minor tenderness to palpation of the suprapubic region with no localized peritonitis. Genitourinary: Urostomy bag is present and filled with yellowish clear urine. Musculoskeletal: No lower extremity tenderness nor edema. No gross deformities of extremities. Neurologic:  Normal speech and language. No gross focal neurologic deficits are appreciated.  Skin:  Skin is warm, dry and intact. Psychiatric: Mood and affect are normal. Speech and behavior are normal.  ____________________________________________   LABS (all labs ordered are listed, but only abnormal results are displayed)  Labs Reviewed  CBC - Abnormal; Notable for the following components:      Result Value   RBC 3.12 (*)    Hemoglobin 9.1 (*)    HCT 26.8 (*)    All other components within normal limits  COMPREHENSIVE METABOLIC PANEL - Abnormal; Notable for the following components:   Sodium 133 (*)    CO2 20 (*)    Glucose, Bld 111 (*)    Calcium 8.5 (*)    Total Protein 5.9 (*)    Albumin 3.1 (*)    Alkaline Phosphatase 30 (*)    All other components within normal limits  RESP PANEL BY RT-PCR (FLU A&B, COVID) ARPGX2  CULTURE, BLOOD (ROUTINE X 2)  CULTURE, BLOOD (ROUTINE X 2)  LACTIC ACID, PLASMA  PROCALCITONIN   ____________________________________________   RADIOLOGY I, Hinda Kehr, personally viewed and evaluated these images (plain radiographs) as part of my medical decision making, as well as reviewing the written report by the radiologist.  ED MD interpretation: Bilateral  pyelonephritis with concerning fluid collection at the cystectomy bed consistent with developing abscess.  Official radiology report(s): CT ABDOMEN PELVIS W CONTRAST  Result Date: 01/12/2021 CLINICAL DATA:  Nausea vomiting and abdominal pain. EXAM: CT ABDOMEN AND PELVIS WITH CONTRAST TECHNIQUE: Multidetector CT imaging of the abdomen and pelvis was performed using the standard protocol  following bolus administration of intravenous contrast. CONTRAST:  5m OMNIPAQUE IOHEXOL 350 MG/ML SOLN COMPARISON:  Abdominal ultrasound dated 04/05/2020 and CT dated 07/07/2018. FINDINGS: Lower chest: Bibasilar atelectasis/scarring. No intra-abdominal free air.  No free fluid. Hepatobiliary: Mild fatty liver. No intrahepatic biliary ductal dilatation. There is a 14 mm stone in the region of the gallbladder neck. No pericholecystic fluid or evidence of acute cholecystitis by CT. Pancreas: Unremarkable. No pancreatic ductal dilatation or surrounding inflammatory changes. Spleen: Normal in size without focal abnormality. Adrenals/Urinary Tract: The adrenal glands are unremarkable. There is mild bilateral hydronephrosis. There is haziness of the perinephric and periureteric fat with urothelial enhancement of the renal collecting system and ureters most consistent with pyelonephritis. Correlation with urinalysis recommended. Subcentimeter partially exophytic left renal upper pole hypodense lesion is too small to characterize, likely a cyst. 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 Postsurgical changes of cystectomy. There is a complex loculated collection containing fluid and air at the cystectomy bed measuring approximately 8 x 3 x 2 cm. There is enhancement of the walls of this collection. Findings most consistent with developing abscess. Stomach/Bowel: Postsurgical changes of the bowel with a right lower quadrant ileal conduit. There is sigmoid diverticulosis without active inflammatory changes. No bowel obstruction. The  appendix is normal. Vascular/Lymphatic: Mild aortoiliac atherosclerotic disease. The IVC is unremarkable. No portal venous gas. There is no adenopathy. Reproductive: The prostate gland is poorly visualized. The seminal vesicles are symmetric. Other: Midline vertical anterior abdominal wall incisional scar. Musculoskeletal: Degenerative changes of the spine. No acute osseous pathology. IMPRESSION: 1. 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 There is a complex loculated collection containing fluid and air at the cystectomy bed most consistent with developing abscess. 2. Mild bilateral hydronephrosis with findings of pyelonephritis. Correlation with urinalysis recommended. 3. Sigmoid diverticulosis. No bowel obstruction. Normal appendix. 4. Cholelithiasis. 5. Aortic Atherosclerosis (ICD10-I70.0). Electronically Signed   By: AAnner CreteM.D.   On: 01/12/2021 01:37    ____________________________________________   PROCEDURES   Procedure(s) performed (including Critical Care):  .Critical Care  Date/Time: 01/12/2021 3:10 AM Performed by: FHinda Kehr MD Authorized by: FHinda Kehr MD   Critical care provider statement:    Critical care time (minutes):  60   Critical care time was exclusive of:  Separately billable procedures and treating other patients   Critical care was necessary to treat or prevent imminent or life-threatening deterioration of the following conditions:  Sepsis   Critical care was time spent personally by me on the following activities:  Development of treatment plan with patient or surrogate, discussions with consultants, evaluation of patient's response to treatment, examination of patient, obtaining history from patient or surrogate, ordering and performing treatments and interventions, ordering and review of laboratory studies, ordering and review of radiographic studies, pulse oximetry, re-evaluation of patient's condition and review of old  charts   ____________________________________________   [place]/ [place] / [place]/ ED COURSE  As part of my medical decision making, I reviewed the following data within the electronic MEDICAL RECORD NUMBER History obtained from family, Nursing notes reviewed and incorporated, Labs reviewed , Old chart reviewed, Discussed with admitting physician (Dr. TMarcello Moores, Discussed with urologist (Dr. SDiamantina Providence, and reviewed Notes from prior ED visits   Differential diagnosis includes, but is not limited to, UTI, pyelonephritis, postsurgical abscess, COVID-19, bacteremia.  Respiratory viral panel is negative.  Vital signs remained stable; even though he was febrile initially, his temperature resolved and he has no tachycardia,  hypoxemia, hypotension, or other abnormality.  He also has a normal lactic acid. he does not meet sepsis criteria.  However, in spite of his reassuring labs and no leukocytosis, I am concerned about intra-abdominal infection.  I ordered a CT scan with IV contrast.  CT scan concerning for bilateral pyelonephritis as well as what is probably an 8 x 3 x 2 cm postsurgical infection in the cystectomy bed.  I added on a procalcitonin which is elevated at 15.1.  At this point I feel that it is safe to call him septic in spite of his reassuring lab work and I am calling code sepsis.  All of the septic work-up has been done and he does not require 30 mL/kg of IV fluids given that he does not meet criteria for severe sepsis nor septic shock.  I will contact the hospitalist for admission.  I updated the patient and provided ceftriaxone 1 g IV to begin treatment.  He understands and agrees with the plan.       Clinical Course as of 01/12/21 0311  Fri Jan 12, 2021  0245 Discussed case by phone with Dr. Marcello Moores with the hospitalist service and she will admit.  I am also calling Dr. Diamantina Providence to discuss the concern for abscess [CF]  0305 I spoke with Dr. Diamantina Providence by phone.  He  recommended transferring back to [facility] if at all possible which is very reasonable since the patient is about 1 month postop.  However I discussed that extensively with the patient and his wife and explained that he will most likely need an interventional radiology procedure and/or urology intervention, but he strongly prefers to stay at [facility].  They have significant transportation issues getting back and forth in [place] and they want to stay here.  Since [facility] has all the services he should need for acute treatment, at least in the short-term, I think this is appropriate.  I have updated Dr. Marcello Moores and Dr. Diamantina Providence about the plan and we will proceed with admission.  I added an extra gram of ceftriaxone IV for a total of 2 g ceftriaxone IV and I have also consulted the pharmacy for vancomycin administration for broader spectrum coverage including the possibility of MRSA.  Patient is having rigors now even though he has no fever by oral temperature and is having worsening suprapubic pain; I ordered morphine 4 mg IV.  Patient is to remain n.p.o. and I ordered normal saline 100 mL/h infusion. [CF]    Clinical Course User Index [CF] Hinda Kehr, MD     ____________________________________________  FINAL CLINICAL IMPRESSION(S) / ED DIAGNOSES  Final diagnoses:  Pyelonephritis  Pelvic abscess in male [facility])  Sepsis, due to unspecified organism, unspecified whether acute organ dysfunction present [facility])     MEDICATIONS GIVEN DURING THIS VISIT:  Medications  cefTRIAXone (ROCEPHIN) 1 g in sodium chloride 0.9 % 100 mL IVPB (1 g Intravenous New Bag/Given 01/12/21 0306)  0.9 %  sodium chloride infusion (has no administration in time range)  acetaminophen (TYLENOL) tablet 650 mg (650 mg Oral Given 01/11/21 1949)  lactated ringers bolus 1,000 mL (1,000 mLs Intravenous New Bag/Given 01/12/21 0054)  iohexol (OMNIPAQUE) 350 MG/ML injection 80 mL (80 mLs Intravenous Contrast Given 01/12/21 0119)   cefTRIAXone (ROCEPHIN) 1 g in sodium chloride 0.9 % 100 mL IVPB (0 g Intravenous Stopped 01/12/21 0253)  morphine 4 MG/ML injection 4 mg (4 mg Intravenous Given 01/12/21 0305)     ED Discharge Orders  None        Note:  This document was prepared using Dragon voice recognition software and may include unintentional dictation errors.   Hinda Kehr, MD 01/12/21 [id number]

## 2021-01-12 NOTE — ED Notes (Signed)
Patient is sitting on side of the stretcher, eating a dinner tray without difficulty. Patient denies dyspnea at this time. O2 is off at this time.

## 2021-01-12 NOTE — ED Notes (Signed)
Hospitalist at bedside, updated on pt's pain and breathing. States will put in orders.

## 2021-01-12 NOTE — H&P (Signed)
History and Physical    [name] [id number] DOB: 29-Oct-1945 DOA: 01/12/2021  PCP: Derinda Late, MD  Patient coming from: home  I have personally briefly reviewed patient's old medical records in [place]  Chief Complaint: fever/chills /suprapubic pain  HPI: Donald Barry is a 75 y.o. 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 Patient initially presented to ed on 8/31 with constipation. He was also at that time found to have ? UTI , he was treated for constipation with good results and discharged to follow with his urologist the same day. Patient noted no antibiotic was prescribed, however hours later he  Developed fever, chills , and suprapubic pain and due to this he returned to ED. In ED patient on ed evaluation was found to have  b/l lower pyelonephritis with developing abscess. Dr Diamantina Providence was consulted who recommended transfer back to [facility] however patient and wife requested admission to [facility] instead. Patient on  evaluation rigor unable to get full ros.  ED Course:  ED course complicated by elevated temp/hr Novella Olive / procal of 15,and hypoxemia despite clear cxr. Patient dx with sepsis and place on sepsis protocol Labs Vbg:7.23/pco2 44 Covid negative  Tx LR x 2L, ctx 2grams, mag, zofran, tylenol 1 gram  Tmx 104,  sat 100% on ra to 75% requiring bipap transiently then NRB and then high flow [facility] with sat maintaining at 94%  Cxr:NAD. CTAB 1. 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 There is a complex loculated collection containing fluid and air at the cystectomy bed most consistent with developing abscess. 2. Mild bilateral hydronephrosis with findings of pyelonephritis. Correlation with urinalysis recommended. 3. Sigmoid diverticulosis. No bowel obstruction. Normal appendix. 4. Cholelithiasis. 5. Aortic Atherosclerosis  (ICD10-I70.0).  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 SCCis   Squamous cell skin cancer 07/2017   Resected from scalp.    Past Surgical History:  Procedure Laterality Date   BACK SURGERY  2008   L4, L5   COLONOSCOPY     CYSTO WITH HYDRODISTENSION N/A 06/17/2019   Procedure: CYSTOSCOPY/HYDRODISTENSION;  Surgeon: Billey Co, MD;  Location: [facility] ORS;  Service: Urology;  Laterality: N/A;   CYSTOSCOPY WITH BIOPSY N/A 07/17/2018   Procedure: CYSTOSCOPY WITH BLADDER BIOPSY;  Surgeon: Billey Co, MD;  Location: [facility] ORS;  Service: Urology;  Laterality: N/A;   CYSTOSCOPY WITH BIOPSY N/A 06/17/2019   Procedure: CYSTOSCOPY WITH BLADDER  BIOPSY;  Surgeon: Billey Co, MD;  Location: [facility] ORS;  Service: Urology;  Laterality: N/A;   CYSTOSCOPY WITH FULGERATION N/A 07/17/2018   Procedure: CYSTOSCOPY WITH FULGERATION;  Surgeon: Billey Co, MD;  Location: [facility] ORS;  Service: Urology;  Laterality: N/A;   CYSTOSCOPY WITH FULGERATION N/A 06/17/2019   Procedure: CYSTOSCOPY WITH FULGERATION;  Surgeon: Billey Co, MD;  Location: [facility] ORS;  Service: Urology;  Laterality: N/A;   ESOPHAGOGASTRODUODENOSCOPY     HERNIA REPAIR  0000000   umbilical   STOMACH SURGERY     FUNDIC GLAND POLYP     reports that he has never smoked. He has never  used smokeless tobacco. He reports that he does not drink alcohol and does not use drugs.  Allergies  Allergen Reactions   Tape Dermatitis    Skin blistered- steri strip    Wound Dressing Adhesive Dermatitis    Skin blistered- steri strip   Shellfish Allergy Diarrhea and Nausea And Vomiting    Shrimp    Shellfish-Derived Products Diarrhea, Nausea And Vomiting and Nausea Only   Onion     diarrhea   Other Nausea And Vomiting    general anesthesia   Codeine Nausea Only    Family History  Problem Relation Age of Onset   Hypertension Mother    GER disease Sister    GER disease Brother    Prostate cancer Neg Hx    Kidney cancer Neg Hx    Prior to Admission medications   Medication Sig Start Date End Date Taking? Authorizing Provider  acetaminophen (TYLENOL) 500 MG tablet Take 1,000 mg by mouth every 6 (six) hours as needed for pain. 12/23/20   [provider]  amitriptyline (ELAVIL) 50 MG tablet Take 1 tablet (50 mg total) by mouth at bedtime. 12/31/19   Nori Riis, PA-C  aspirin EC 81 MG tablet Take 81 mg by mouth daily.     [provider]  atorvastatin (LIPITOR) 10 MG tablet Take 10 mg by mouth daily. 08/06/19   [provider]  cimetidine (TAGAMET) 400 MG tablet Take 400 mg by mouth 2 (two) times daily. Breakfast & supper    [provider]  dextromethorphan-guaiFENesin (MUCINEX DM) 30-600 MG 12hr tablet Take 1 tablet by mouth at bedtime.    [provider]  ELMIRON 100 MG capsule Take 100 mg by mouth 3 (three) times daily. 04/25/20   [provider]  fluticasone (FLONASE) 50 MCG/ACT nasal spray Place 1 spray into the nose at bedtime.     [provider]  fluticasone (FLONASE) 50 MCG/ACT nasal spray Place 1 spray into the nose at bedtime.    [provider]  hydrocortisone 2.5 % cream Apply topically 2 (two) times daily. Up to 2 weeks at underarms Patient not taking: Reported on 05/22/2020 03/23/20    Laurence Ferrari, [place], MD  hydrOXYzine (ATARAX/VISTARIL) 25 MG tablet Take 25 mg by mouth daily. 03/30/20   [provider]  ibuprofen (ADVIL) 200 MG tablet Take 400 mg by mouth daily as needed for moderate pain.     [provider]  ibuprofen (ADVIL) 200 MG tablet Take 200 mg by mouth every hour as needed.    [provider]  loratadine (ALAVERT) 10 MG dissolvable tablet Take 10 mg by mouth daily.     [provider]  Menthol, Topical Analgesic, (BLUE-EMU MAXIMUM STRENGTH EX) Apply 1 application topically 4 (four) times daily as needed (joint pain).     [provider]  montelukast (SINGULAIR) 10 MG tablet TAKE 1 TABLET BY MOUTH EVERY DAY AT NIGHT 12/16/19   [provider]  Multiple Vitamin (MULTIVITAMIN WITH MINERALS) TABS tablet Take 1 tablet by mouth daily. Iron Free    [provider]  Multiple Vitamins-Minerals (CENTRUM FRESH/FRUITY 50+) CHEW Chew 1 tablet by mouth daily.    [provider]  mupirocin ointment (BACTROBAN) 2 % Apply 1 application topically daily. To open wound at right ear Patient not taking: Reported on 05/22/2020 03/23/20   Laurence Ferrari, [place], MD  omeprazole (PRILOSEC) 40 MG capsule Take 40 mg by mouth daily before breakfast.     [provider]  omeprazole (PRILOSEC) 40 MG capsule Take 40 mg by mouth 2 (two) times daily. 10/25/19   [provider]  ondansetron (ZOFRAN) 4 MG tablet Take 4 mg by mouth every 8 (eight) hours as needed. 12/23/20   [provider]  oxyCODONE (OXY IR/ROXICODONE) 5 MG immediate release tablet Take 5 mg by mouth 4 (four) times daily as needed. 12/23/20   [provider]  Sodium Chloride-Sodium Bicarb (NETI POT SINUS [place] NA) Place 1 Dose into the nose at bedtime as needed (congestion).    [provider]  tamsulosin (FLOMAX) 0.4 MG CAPS capsule Take 0.4 mg by mouth daily. 03/14/20   [provider]    Physical Exam: Vitals:   01/12/21 0335  01/12/21 0345 01/12/21 0400 01/12/21 0402  BP:   (!) 119/49   Pulse:  (!) 116 (!) 116 (!) 117  Resp:   (!) 38   Temp: (!) 104.1 F (40.1 C)     TempSrc: Rectal     SpO2:  100% 98% 98%  Weight:      Height:         Vitals:   01/12/21 0335 01/12/21 0345 01/12/21 0400 01/12/21 0402  BP:   (!) 119/49   Pulse:  (!) 116 (!) 116 (!) 117  Resp:   (!) 38   Temp: (!) 104.1 F (40.1 C)     TempSrc: Rectal     SpO2:  100% 98% 98%  Weight:      Height:      Constitutional: +rigor , tachypneic,restless, uncomfortable Eyes: PERRL, lids and conjunctivae normal ENMT: Mucous membranes are moist. .Normal dentition.  Neck: normal, supple, no masses, no thyromegaly Respiratory: clear to auscultation b/l, no wheezing, no crackles. Increase wob + accessory muscle use.  Cardiovascular: tachycardic normal rhythm, no murmurs / rubs / gallops. No extremity edema. 2+ pedal pulses. Abdomen: + suprapubic tenderness, no masses palpated. No hepatosplenomegaly. Bowel sounds hypoactive.  Musculoskeletal: no clubbing / cyanosis. No joint deformity upper and lower extremities. Good ROM, no contractures. Normal muscle tone.  Skin: no rashes, lesions, ulcers. No induration Neurologic: CN 2-12 grossly intact. Sensation intact,  Strength 5/5 in all 4.  Psychiatric: Normal judgment and insight. Alert and oriented x 3. Normal mood.    Labs on Admission: I have personally reviewed following labs and imaging studies  CBC: Recent Labs  Lab 01/10/21 1946 01/11/21 1947  WBC 8.0 5.4  HGB 10.1* 9.1*  HCT 30.8* 26.8*  MCV 89.5 85.9  PLT 313 [id number]   Basic Metabolic Panel: Recent Labs  Lab 01/10/21 1946 01/11/21 1947  NA 137 133*  K 4.2 3.8  CL 104 104  CO2 23 20*  GLUCOSE 102* 111*  BUN 11 11  CREATININE 0.87 0.85  CALCIUM 9.1 8.5*   GFR: Estimated Creatinine Clearance: 83.7 mL/min (by C-G formula based on SCr of 0.85 mg/dL). Liver Function Tests: Recent Labs  Lab 01/10/21 1946 01/11/21 1947  AST  21 21  ALT 15 14  ALKPHOS 33* 30*  BILITOT 0.7 1.0  PROT 6.6 5.9*  ALBUMIN 3.6 3.1*   Recent  Labs  Lab 01/10/21 1946  LIPASE 37   No results for input(s): AMMONIA in the last 168 hours. Coagulation Profile: No results for input(s): INR, PROTIME in the last 168 hours. Cardiac Enzymes: No results for input(s): CKTOTAL, CKMB, CKMBINDEX, TROPONINI in the last 168 hours. BNP (last 3 results) No results for input(s): PROBNP in the last 8760 hours. HbA1C: No results for input(s): HGBA1C in the last 72 hours. CBG: No results for input(s): GLUCAP in the last 168 hours. Lipid Profile: No results for input(s): CHOL, HDL, LDLCALC, TRIG, CHOLHDL, LDLDIRECT in the last 72 hours. Thyroid Function Tests: No results for input(s): TSH, T4TOTAL, FREET4, T3FREE, THYROIDAB in the last 72 hours. Anemia Panel: No results for input(s): VITAMINB12, FOLATE, FERRITIN, TIBC, IRON, RETICCTPCT in the last 72 hours. Urine analysis:    Component Value Date/Time   COLORURINE YELLOW (A) 01/10/2021 1946   APPEARANCEUR CLOUDY (A) 01/10/2021 1946   APPEARANCEUR Hazy (A) 06/09/2019 1324   LABSPEC 1.005 01/10/2021 1946   PHURINE 7.0 01/10/2021 1946   GLUCOSEU NEGATIVE 01/10/2021 1946   HGBUR MODERATE (A) 01/10/2021 1946   BILIRUBINUR NEGATIVE 01/10/2021 1946   BILIRUBINUR Negative 06/09/2019 [place] 01/10/2021 1946   PROTEINUR 30 (A) 01/10/2021 1946   NITRITE POSITIVE (A) 01/10/2021 1946   LEUKOCYTESUR LARGE (A) 01/10/2021 1946    Radiological Exams on Admission: CT ABDOMEN PELVIS W CONTRAST  Result Date: 01/12/2021 CLINICAL DATA:  Nausea vomiting and abdominal pain. EXAM: CT ABDOMEN AND PELVIS WITH CONTRAST TECHNIQUE: Multidetector CT imaging of the abdomen and pelvis was performed using the standard protocol following bolus administration of intravenous contrast. CONTRAST:  40m OMNIPAQUE IOHEXOL 350 MG/ML SOLN COMPARISON:  Abdominal ultrasound dated 04/05/2020 and CT dated 07/07/2018.  FINDINGS: Lower chest: Bibasilar atelectasis/scarring. No intra-abdominal free air.  No free fluid. Hepatobiliary: Mild fatty liver. No intrahepatic biliary ductal dilatation. There is a 14 mm stone in the region of the gallbladder neck. No pericholecystic fluid or evidence of acute cholecystitis by CT. Pancreas: Unremarkable. No pancreatic ductal dilatation or surrounding inflammatory changes. Spleen: Normal in size without focal abnormality. Adrenals/Urinary Tract: The adrenal glands are unremarkable. There is mild bilateral hydronephrosis. There is haziness of the perinephric and periureteric fat with urothelial enhancement of the renal collecting system and ureters most consistent with pyelonephritis. Correlation with urinalysis recommended. Subcentimeter partially exophytic left renal upper pole hypodense lesion is too small to characterize, likely a cyst. 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 Postsurgical changes of cystectomy. There is a complex loculated collection containing fluid and air at the cystectomy bed measuring approximately 8 x 3 x 2 cm. There is enhancement of the walls of this collection. Findings most consistent with developing abscess. Stomach/Bowel: Postsurgical changes of the bowel with a right lower quadrant ileal conduit. There is sigmoid diverticulosis without active inflammatory changes. No bowel obstruction. The appendix is normal. Vascular/Lymphatic: Mild aortoiliac atherosclerotic disease. The IVC is unremarkable. No portal venous gas. There is no adenopathy. Reproductive: The prostate gland is poorly visualized. The seminal vesicles are symmetric. Other: Midline vertical anterior abdominal wall incisional scar. Musculoskeletal: Degenerative changes of the spine. No acute osseous pathology. IMPRESSION: 1. 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 There is a complex loculated collection containing fluid and air at the cystectomy bed most consistent with  developing abscess. 2. Mild bilateral hydronephrosis with findings of pyelonephritis. Correlation with urinalysis recommended. 3. Sigmoid diverticulosis. No bowel obstruction. Normal appendix. 4. Cholelithiasis. 5. Aortic Atherosclerosis (ICD10-I70.0). Electronically Signed  By: Anner Crete M.D.   On: 01/12/2021 01:37   DG Chest Port 1 View  Result Date: 01/12/2021 CLINICAL DATA:  Lower abdominal and back pain. EXAM: PORTABLE CHEST 1 VIEW COMPARISON:  CT abdomen pelvis dated 01/12/2021. FINDINGS: No focal consolidation, pleural effusion, or pneumothorax. The cardiac silhouette is within limits. No acute osseous pathology. IMPRESSION: No active disease. Electronically Signed   By: Anner Crete M.D.   On: 01/12/2021 04:02    EKG: Independently reviewed.see above  Assessment/Plan   Acute Pyelonephritis  Cystectomy bed abscess Cystectomy ileal conduit creation 8/22 Sepsis  -admit to sdu  -ivfs /broad spectrum abx per sepsis protocol  -id consult in am ( please call)  - urology consult placed  f/u urology recs  -cycle inflammatory markers, f/u on blood/ urine cultrues    Acute hypoxic respiratory failure insetting of sepsis  -cxr negative  -once patient stable consider f/u with CT thorax chest  -abg pending  -vbg notable for pn 7.23   Mild hyponatremia -due to low volume will monitor    GERD  -ppi   NAFLD -no active issue  DVT ppx -lwmh   Code Status: full Family Communication:  wife at bedside  [place] (Spouse)  [phone] (Home Phone) Disposition Plan:patient  expected to be admitted greater than 2 midnights   Consults called:  urology Diamantina Providence MD Admission status: SDU   Clance Boll MD [facility]   If 7PM-7AM, please contact night-coverage [email or web address] Password [email or web address]  01/12/2021, 4:31 AM

## 2021-01-12 NOTE — Consult Note (Signed)
Vascular and Interventional Radiology Brief Consultation Note  Patient: Donald Barry DOB: 17-May-1945 Medical Record Number: [id number] Note Date/Time: 01/12/21 8:38 AM   Admitting Diagnosis: Sepsis ([place]) [A41.9]  1. Pyelonephritis   2. Pelvic abscess in male [facility])   3. Sepsis, due to unspecified organism, unspecified whether acute organ dysfunction present ([place])   4. Sepsis [facility])      Assessment: 75 y.o. year old male who [facility] reached out to [place] On Call Physician for potential drainage of pelvic collection.  Briefly, Pt is 3 wks postop s/p open cystectomy and ileal conduit at [place] by a Dr. Amalia Hailey for end-stage interstitial cystitis.  Pt reportedly seen in clinic yesterday and was doing well, but presented to [facility] ER overnight with F/C.   Imaging reviewed.  CT AP, 01/12/21 wth small deep anterior pelvic collection.      Plan: Small collection, phlegmon vs early abscess, and in a challenging location for percutaneous access. Discussed with Urology (Dr. Diamantina Providence) and agreed to a trial of conservative management.  Repeat CT AP w/ in 3-5 days based on Pt course.  VIR will re-evaluate for potential drain placement at that time.  Thank you for allowing [place] to participate in the care of your Patient. Please contact the [place] Team with questions, concerns, or if new issues arise.   Michaelle Birks, MD [facility]   Pager. [place]

## 2021-01-12 NOTE — Progress Notes (Signed)
*  PRELIMINARY RESULTS* Echocardiogram 2D Echocardiogram has been performed.  Donald Barry 01/12/2021, 1:47 PM

## 2021-01-12 NOTE — ED Notes (Signed)
Critical Lactic Acid 3.2 Dr. Marcello Moores notified.

## 2021-01-12 NOTE — ED Notes (Addendum)
Pt's repeat rectal temp 101.8 Dr Marcello Moores and Hassan Rowan NP notified.

## 2021-01-12 NOTE — ED Notes (Signed)
Pt. States he is nauseous, requested anti emetic/antinausea IV med from Dr. Marcello Moores.

## 2021-01-13 DIAGNOSIS — N12 Tubulo-interstitial nephritis, not specified as acute or chronic: Secondary | ICD-10-CM

## 2021-01-13 DIAGNOSIS — N10 Acute tubulo-interstitial nephritis: Secondary | ICD-10-CM | POA: Diagnosis present

## 2021-01-13 DIAGNOSIS — N1339 Other hydronephrosis: Secondary | ICD-10-CM

## 2021-01-13 DIAGNOSIS — A419 Sepsis, unspecified organism: Secondary | ICD-10-CM

## 2021-01-13 LAB — COMPREHENSIVE METABOLIC PANEL
ALT: 19 U/L (ref 0–44)
ALT: 21 U/L (ref 0–44)
AST: 45 U/L — ABNORMAL HIGH (ref 15–41)
AST: 40 U/L (ref 15–41)
Albumin: 2.4 g/dL — ABNORMAL LOW (ref 3.5–5.0)
Albumin: 2.5 g/dL — ABNORMAL LOW (ref 3.5–5.0)
Alkaline Phosphatase: 27 U/L — ABNORMAL LOW (ref 38–126)
Alkaline Phosphatase: 24 U/L — ABNORMAL LOW (ref 38–126)
Anion gap: 5 (ref 5–15)
Anion gap: 8 (ref 5–15)
BUN: 17 mg/dL (ref 8–23)
BUN: 16 mg/dL (ref 8–23)
CO2: 21 mmol/L — ABNORMAL LOW (ref 22–32)
CO2: 23 mmol/L (ref 22–32)
Calcium: 7.5 mg/dL — ABNORMAL LOW (ref 8.9–10.3)
Calcium: 7.9 mg/dL — ABNORMAL LOW (ref 8.9–10.3)
Chloride: 113 mmol/L — ABNORMAL HIGH (ref 98–111)
Chloride: 109 mmol/L (ref 98–111)
Creatinine, Ser: 0.71 mg/dL (ref 0.61–1.24)
Creatinine, Ser: 0.77 mg/dL (ref 0.61–1.24)
GFR, Estimated: >60 mL/min (ref >60)
GFR, Estimated: >60 mL/min (ref >60)
Glucose, Bld: 96 mg/dL (ref 70–99)
Glucose, Bld: 104 mg/dL — ABNORMAL HIGH (ref 70–99)
Potassium: 3.9 mmol/L (ref 3.5–5.1)
Potassium: 4.1 mmol/L (ref 3.5–5.1)
Sodium: 139 mmol/L (ref 135–145)
Sodium: 140 mmol/L (ref 135–145)
Total Bilirubin: 0.4 mg/dL (ref 0.3–1.2)
Total Bilirubin: 0.3 mg/dL (ref 0.3–1.2)
Total Protein: 5 g/dL — ABNORMAL LOW (ref 6.5–8.1)
Total Protein: 5.3 g/dL — ABNORMAL LOW (ref 6.5–8.1)

## 2021-01-13 LAB — URINE CULTURE: Culture: >=100000 — AB

## 2021-01-13 LAB — CBC
HCT: 27 % — ABNORMAL LOW (ref 39.0–52.0)
Hemoglobin: 8.6 g/dL — ABNORMAL LOW (ref 13.0–17.0)
MCH: 29.1 pg (ref 26.0–34.0)
MCHC: 31.9 g/dL (ref 30.0–36.0)
MCV: 91.2 fL (ref 80.0–100.0)
Platelets: 155 10*3/uL (ref 150–400)
RBC: 2.96 MIL/uL — ABNORMAL LOW (ref 4.22–5.81)
RDW: 15 % (ref 11.5–15.5)
WBC: 5.3 10*3/uL (ref 4.0–10.5)
nRBC: 0 % (ref 0.0–0.2)

## 2021-01-13 LAB — MAGNESIUM
Magnesium: 2 mg/dL (ref 1.7–2.4)
Magnesium: 2.1 mg/dL (ref 1.7–2.4)

## 2021-01-13 MED ORDER — SODIUM CHLORIDE 0.9 % IV SOLN: INTRAVENOUS | Status: AC

## 2021-01-13 MED ORDER — SODIUM CHLORIDE 0.9 % IV BOLUS
500.0000 mL | Freq: Once | INTRAVENOUS | Status: AC
  Administered 2021-01-13: 500 mL via INTRAVENOUS

## 2021-01-13 MED ORDER — METOPROLOL TARTRATE 5 MG/5ML IV SOLN
2.5000 mg | Freq: Once | INTRAVENOUS | Status: AC
  Administered 2021-01-13: 2.5 mg via INTRAVENOUS
  Filled 2021-01-13: qty 5

## 2021-01-13 MED ORDER — ATORVASTATIN CALCIUM 10 MG PO TABS
10.0000 mg | ORAL_TABLET | Freq: Every day | ORAL | Status: DC
  Administered 2021-01-13 – 2021-01-15 (×3): 10 mg via ORAL
  Filled 2021-01-13 (×3): qty 1

## 2021-01-13 MED ORDER — PANTOPRAZOLE SODIUM 40 MG PO TBEC
40.0000 mg | DELAYED_RELEASE_TABLET | Freq: Every day | ORAL | Status: DC
  Administered 2021-01-14 – 2021-01-15 (×2): 40 mg via ORAL
  Filled 2021-01-13 (×2): qty 1

## 2021-01-13 MED ORDER — SODIUM CHLORIDE 0.9 % IV BOLUS
1000.0000 mL | Freq: Once | INTRAVENOUS | Status: AC
  Administered 2021-01-13: 1000 mL via INTRAVENOUS

## 2021-01-13 MED ORDER — ASPIRIN EC 81 MG PO TBEC: 81.0000 mg | DELAYED_RELEASE_TABLET | Freq: Every day | ORAL | Status: DC

## 2021-01-13 MED ORDER — SENNOSIDES-DOCUSATE SODIUM 8.6-50 MG PO TABS
1.0000 | ORAL_TABLET | Freq: Two times a day (BID) | ORAL | Status: DC
  Administered 2021-01-13 – 2021-01-15 (×5): 1 via ORAL
  Filled 2021-01-13 (×5): qty 1

## 2021-01-13 MED ORDER — ASPIRIN EC 81 MG PO TBEC
81.0000 mg | DELAYED_RELEASE_TABLET | Freq: Every day | ORAL | Status: DC
  Administered 2021-01-14 – 2021-01-15 (×2): 81 mg via ORAL
  Filled 2021-01-13 (×2): qty 1

## 2021-01-13 MED ORDER — POLYETHYLENE GLYCOL 3350 17 G PO PACK
17.0000 g | PACK | Freq: Every day | ORAL | Status: DC
  Administered 2021-01-13 – 2021-01-15 (×2): 17 g via ORAL
  Filled 2021-01-13 (×3): qty 1

## 2021-01-13 MED ORDER — ENOXAPARIN SODIUM 40 MG/0.4ML IJ SOSY
40.0000 mg | PREFILLED_SYRINGE | INTRAMUSCULAR | Status: DC
  Administered 2021-01-13 – 2021-01-15 (×3): 40 mg via SUBCUTANEOUS
  Filled 2021-01-13 (×3): qty 0.4

## 2021-01-13 MED ORDER — METOPROLOL TARTRATE 5 MG/5ML IV SOLN
5.0000 mg | Freq: Four times a day (QID) | INTRAVENOUS | Status: DC
  Administered 2021-01-13 (×2): 5 mg via INTRAVENOUS
  Filled 2021-01-13 (×5): qty 5

## 2021-01-13 NOTE — Progress Notes (Signed)
Subjective: Donald Barry is afebrile but variably tachycardic and borderline hypotensive.  He has no suprapubic pain but has some left flank pain.   His UOP appears adequate with several hundred ml of clear urine in the bag.   His leukocpenia is resolving and his Cr is normal.   ROS:  Review of Systems  Genitourinary:  Positive for flank pain.   Anti-infectives: Anti-infectives (From admission, onward)    Start     Dose/Rate Route Frequency Ordered Stop   01/13/21 0000  cefTRIAXone (ROCEPHIN) 2 g in sodium chloride 0.9 % 100 mL IVPB  Status:  Discontinued       Note to Pharmacy: 24 hours after 1st dose   2 g 200 mL/hr over 30 Minutes Intravenous Every 24 hours 01/12/21 0320 01/12/21 0326   01/12/21 2200  metroNIDAZOLE (FLAGYL) IVPB 500 mg        500 mg 100 mL/hr over 60 Minutes Intravenous Every 12 hours 01/12/21 1343     01/12/21 1500  ceFEPIme (MAXIPIME) 2 g in sodium chloride 0.9 % 100 mL IVPB        2 g 200 mL/hr over 30 Minutes Intravenous Every 8 hours 01/12/21 1348     01/12/21 0600  piperacillin-tazobactam (ZOSYN) IVPB 3.375 g        3.375 g 12.5 mL/hr over 240 Minutes Intravenous Every 8 hours 01/12/21 0328 01/12/21 1347   01/12/21 0330  cefTRIAXone (ROCEPHIN) 1 g in sodium chloride 0.9 % 100 mL IVPB  Status:  Discontinued       Note to Pharmacy: Additional dose for sepsis and suspected bacteremia with b/l pyelo and post surgical abscess   1 g 200 mL/hr over 30 Minutes Intravenous STAT 01/12/21 0319 01/12/21 0326   01/12/21 0315  vancomycin (VANCOREADY) IVPB 2000 mg/400 mL        2,000 mg 200 mL/hr over 120 Minutes Intravenous  Once 01/12/21 0313 01/12/21 0648   01/12/21 0300  cefTRIAXone (ROCEPHIN) 1 g in sodium chloride 0.9 % 100 mL IVPB        1 g 200 mL/hr over 30 Minutes Intravenous STAT 01/12/21 0247 01/12/21 0336   01/12/21 0145  cefTRIAXone (ROCEPHIN) 1 g in sodium chloride 0.9 % 100 mL IVPB        1 g 200 mL/hr over 30 Minutes Intravenous  Once 01/12/21 0144 01/12/21  0253       Current Facility-Administered Medications  Medication Dose Route Frequency Provider Last Rate Last Admin   0.9 %  sodium chloride infusion   Intravenous Continuous Cherene Altes, MD 150 mL/hr at 01/13/21 1211 New Bag at 01/13/21 1211   acetaminophen (TYLENOL) tablet 650 mg  650 mg Oral Q6H PRN Clance Boll, MD   650 mg at 01/13/21 0038   [START ON 01/14/2021] aspirin EC tablet 81 mg  81 mg Oral Daily Joette Catching T, MD       atorvastatin (LIPITOR) tablet 10 mg  10 mg Oral Daily Joette Catching T, MD   10 mg at 01/13/21 1141   ceFEPIme (MAXIPIME) 2 g in sodium chloride 0.9 % 100 mL IVPB  2 g Intravenous Q8H Berton Mount, RPH   Stopped at 01/13/21 1346   enoxaparin (LOVENOX) injection 40 mg  40 mg Subcutaneous Q24H Joette Catching T, MD   40 mg at 01/13/21 1143   metroNIDAZOLE (FLAGYL) IVPB 500 mg  500 mg Intravenous Q12H Cherene Altes, MD   Stopped at 01/13/21 1128   morphine 2 MG/ML injection  2 mg  2 mg Intravenous Q4H PRN Myles Rosenthal A, MD       ondansetron [facility]) injection 4 mg  4 mg Intravenous Q6H PRN Myles Rosenthal A, MD   4 mg at 01/12/21 0538   pantoprazole (PROTONIX) EC tablet 40 mg  40 mg Oral Daily Cherene Altes, MD       polyethylene glycol (MIRALAX / GLYCOLAX) packet 17 g  17 g Oral Daily Joette Catching T, MD   17 g at 01/13/21 1142   promethazine (PHENERGAN) tablet 12.5 mg  12.5 mg Oral Q6H PRN Clance Boll, MD       senna-docusate (Senokot-S) tablet 1 tablet  1 tablet Oral BID Cherene Altes, MD   1 tablet at 01/13/21 1141   sodium chloride flush (NS) 0.9 % injection 3 mL  3 mL Intravenous Q12H Myles Rosenthal A, MD   3 mL at 01/13/21 1000   Current Outpatient Medications  Medication Sig Dispense Refill   aspirin EC 81 MG tablet Take 81 mg by mouth daily.      atorvastatin (LIPITOR) 10 MG tablet Take 10 mg by mouth daily.     dextromethorphan-guaiFENesin (MUCINEX DM) 30-600 MG 12hr tablet Take 1 tablet by  mouth at bedtime.     ibuprofen (ADVIL) 200 MG tablet Take 400 mg by mouth daily as needed for moderate pain.      Multiple Vitamin (MULTIVITAMIN WITH MINERALS) TABS tablet Take 1 tablet by mouth daily. Iron Free     omeprazole (PRILOSEC) 40 MG capsule Take 40 mg by mouth daily before breakfast.      acetaminophen (TYLENOL) 500 MG tablet Take 1,000 mg by mouth every 6 (six) hours as needed for pain.     fluticasone (FLONASE) 50 MCG/ACT nasal spray Place 1 spray into the nose at bedtime.      Menthol, Topical Analgesic, (BLUE-EMU MAXIMUM STRENGTH EX) Apply 1 application topically 4 (four) times daily as needed (joint pain).      Sodium Chloride-Sodium Bicarb (NETI POT SINUS WASH NA) Place 1 Dose into the nose at bedtime as needed (congestion).       Objective: Vital signs in last 24 hours: Pulse Rate:  [63-129] 119 (09/03 1300) Resp:  [11-28] 19 (09/03 1300) BP: (83-112)/(50-76) 91/70 (09/03 1300) SpO2:  [93 %-100 %] 97 % (09/03 1300)  Intake/Output from previous day: 09/02 0701 - 09/03 0700 In: 1897.3 [I.V.:1097.3; IV Piggyback:800] Out: 200 [Urine:200] Intake/Output this shift: Total I/O In: 500 [IV Piggyback:500] Out: -    Physical Exam Vitals reviewed.  Constitutional:      Appearance: He is well-developed.  Abdominal:     Comments: Soft, flat with a healing lower midline incision without erythema or drainage.  The RLQ stone is pink and productive.  There is no suprapubic tenderness but he has some left CVAT.   Neurological:     Mental Status: He is alert.    Lab Results:  Recent Labs    01/12/21 0617 01/12/21 0736 01/12/21 1233  WBC 3.8*  --  5.9  HGB 7.7* 7.9* 8.3*  HCT 23.5* 25.0* 27.0*  PLT 162  --  169   BMET Recent Labs    01/13/21 0459 01/13/21 0606  NA 139 140  K 3.9 4.1  CL 113* 109  CO2 21* 23  GLUCOSE 96 104*  BUN 17 16  CREATININE 0.71 0.77  CALCIUM 7.5* 7.9*   PT/INR Recent Labs    01/12/21 0415  LABPROT 17.2*  INR  1.4*    ABG Recent Labs    01/12/21 0345 01/12/21 0426  PHART  --  7.44  HCO3 18.4* 20.4    Studies/Results: CT ABDOMEN PELVIS W CONTRAST  Result Date: 01/12/2021 CLINICAL DATA:  Nausea vomiting and abdominal pain. EXAM: CT ABDOMEN AND PELVIS WITH CONTRAST TECHNIQUE: Multidetector CT imaging of the abdomen and pelvis was performed using the standard protocol following bolus administration of intravenous contrast. CONTRAST:  18m OMNIPAQUE IOHEXOL 350 MG/ML SOLN COMPARISON:  Abdominal ultrasound dated 04/05/2020 and CT dated 07/07/2018. FINDINGS: Lower chest: Bibasilar atelectasis/scarring. No intra-abdominal free air.  No free fluid. Hepatobiliary: Mild fatty liver. No intrahepatic biliary ductal dilatation. There is a 14 mm stone in the region of the gallbladder neck. No pericholecystic fluid or evidence of acute cholecystitis by CT. Pancreas: Unremarkable. No pancreatic ductal dilatation or surrounding inflammatory changes. Spleen: Normal in size without focal abnormality. Adrenals/Urinary Tract: The adrenal glands are unremarkable. There is mild bilateral hydronephrosis. There is haziness of the perinephric and periureteric fat with urothelial enhancement of the renal collecting system and ureters most consistent with pyelonephritis. Correlation with urinalysis recommended. Subcentimeter partially exophytic left renal upper pole hypodense lesion is too small to characterize, likely a cyst. 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 Postsurgical changes of cystectomy. There is a complex loculated collection containing fluid and air at the cystectomy bed measuring approximately 8 x 3 x 2 cm. There is enhancement of the walls of this collection. Findings most consistent with developing abscess. Stomach/Bowel: Postsurgical changes of the bowel with a right lower quadrant ileal conduit. There is sigmoid diverticulosis without active inflammatory changes. No bowel obstruction. The appendix is normal.  Vascular/Lymphatic: Mild aortoiliac atherosclerotic disease. The IVC is unremarkable. No portal venous gas. There is no adenopathy. Reproductive: The prostate gland is poorly visualized. The seminal vesicles are symmetric. Other: Midline vertical anterior abdominal wall incisional scar. Musculoskeletal: Degenerative changes of the spine. No acute osseous pathology. IMPRESSION: 1. 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 There is a complex loculated collection containing fluid and air at the cystectomy bed most consistent with developing abscess. 2. Mild bilateral hydronephrosis with findings of pyelonephritis. Correlation with urinalysis recommended. 3. Sigmoid diverticulosis. No bowel obstruction. Normal appendix. 4. Cholelithiasis. 5. Aortic Atherosclerosis (ICD10-I70.0). Electronically Signed   By: AAnner CreteM.D.   On: 01/12/2021 01:37   DG Chest Port 1 View  Result Date: 01/12/2021 CLINICAL DATA:  Lower abdominal and back pain. EXAM: PORTABLE CHEST 1 VIEW COMPARISON:  CT abdomen pelvis dated 01/12/2021. FINDINGS: No focal consolidation, pleural effusion, or pneumothorax. The cardiac silhouette is within limits. No acute osseous pathology. IMPRESSION: No active disease. Electronically Signed   By: AAnner CreteM.D.   On: 01/12/2021 04:02   ECHOCARDIOGRAM COMPLETE  Result Date: 01/12/2021    ECHOCARDIOGRAM REPORT   [facility]: 01/12/2021 Medical Rec #:  [id number]       Height:       72.0 in Accession #:    [id number]      Weight:       200.0 lb Date of Birth:  [birth year]       BSA:          2.131 m Patient Age:    89years         BP:           105/67 mmHg Patient Gender: M  HR:           62 bpm. Exam Location:  [facility] Procedure: 2D Echo, Color Doppler and Cardiac Doppler Indications:     R06.00 Dyspnea  History:         Patient has no prior history of Echocardiogram examinations.                  Risk Factors:Sleep Apnea.   Sonographer:     Charmayne Sheer Referring Phys:  [id number] SARA-MAIZ A THOMAS Diagnosing Phys: Neoma Laming  Sonographer Comments: Image acquisition challenging due to respiratory motion. IMPRESSIONS  1. Left ventricular ejection fraction, by estimation, is 50 to 55%. The left ventricle has low normal function. The left ventricle demonstrates global hypokinesis. Left ventricular diastolic parameters are consistent with Grade III diastolic dysfunction  (restrictive).  2. Right ventricular systolic function is normal. The right ventricular size is normal.  3. 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 No evidence of mitral stenosis.  4. The aortic valve is normal in structure. Aortic valve regurgitation is mild. Mild aortic valve sclerosis is present, with no evidence of aortic valve stenosis.  5. The inferior vena cava is normal in size with greater than 50% respiratory variability, suggesting right atrial pressure of 3 mmHg. FINDINGS  Left Ventricle: Left ventricular ejection fraction, by estimation, is 50 to 55%. The left ventricle has low normal function. The left ventricle demonstrates global hypokinesis. The left ventricular internal cavity size was normal in size. There is borderline concentric left ventricular hypertrophy. Left ventricular diastolic parameters are consistent with Grade III diastolic dysfunction (restrictive). Right Ventricle: The right ventricular size is normal. No increase in right ventricular wall thickness. Right ventricular systolic function is normal. Left Atrium: Left atrial size was normal in size. Right Atrium: Right atrial size was normal in size. Pericardium: There is no evidence of pericardial effusion. 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 No evidence of mitral valve stenosis. MV peak gradient, 6.4 mmHg. The mean mitral valve gradient is 2.0 mmHg. Tricuspid Valve: The tricuspid valve is normal in structure. Tricuspid  valve regurgitation is not demonstrated. No evidence of tricuspid stenosis. Aortic Valve: The aortic valve is normal in structure. Aortic valve regurgitation is mild. Mild aortic valve sclerosis is present, with no evidence of aortic valve stenosis. Aortic valve mean gradient measures 4.0 mmHg. Aortic valve peak gradient measures 8.2 mmHg. Aortic valve area, by VTI measures 3.53 cm. Pulmonic Valve: The pulmonic valve was normal in structure. Pulmonic valve regurgitation is not visualized. No evidence of pulmonic stenosis. Aorta: The aortic root is normal in size and structure. Venous: The inferior vena cava is normal in size with greater than 50% respiratory variability, suggesting right atrial pressure of 3 mmHg. IAS/Shunts: No atrial level shunt detected by color flow Doppler.  LEFT VENTRICLE PLAX 2D LVIDd:         4.44 cm  Diastology LVIDs:         3.31 cm  LV e' medial:    8.49 cm/s LV PW:         0.82 cm  LV E/e' medial:  13.3 LV IVS:        0.80 cm  LV e' lateral:   8.05 cm/s LVOT diam:     2.60 cm  LV E/e' lateral: 14.0 LV SV:         97 LV SV Index:   45 LVOT Area:     5.31 cm  LEFT ATRIUM  Index LA diam:        3.70 cm 1.74 cm/m LA Vol (A2C):   46.7 ml 21.92 ml/m LA Vol (A4C):   51.7 ml 24.27 ml/m LA Biplane Vol: 52.5 ml 24.64 ml/m  AORTIC VALVE                   PULMONIC VALVE AV Area (Vmax):    3.37 cm    PV Vmax:       0.62 m/s AV Area (Vmean):   3.35 cm    PV Vmean:      46.400 cm/s AV Area (VTI):     3.53 cm    PV VTI:        0.130 m AV Vmax:           143.00 cm/s PV Peak grad:  1.5 mmHg AV Vmean:          99.100 cm/s PV Mean grad:  1.0 mmHg AV VTI:            0.274 m AV Peak Grad:      8.2 mmHg AV Mean Grad:      4.0 mmHg LVOT Vmax:         90.70 cm/s LVOT Vmean:        62.600 cm/s LVOT VTI:          0.182 m LVOT/AV VTI ratio: 0.66  AORTA Ao Root diam: 3.90 cm MITRAL VALVE MV Area (PHT): 2.78 cm     SHUNTS MV Area VTI:   2.75 cm     Systemic VTI:  0.18 m MV Peak grad:  6.4 mmHg      Systemic Diam: 2.60 cm MV Mean grad:  2.0 mmHg MV Vmax:       1.26 m/s MV Vmean:      70.7 cm/s MV Decel Time: 273 msec MV E velocity: 113.00 cm/s MV A velocity: 78.40 cm/s MV E/A ratio:  1.44 Shaukat Khan Electronically signed by Neoma Laming Signature Date/Time: 01/12/2021/3:19:44 PM    Final      Assessment and Plan: Sepsis with possible pyelonephritis.  He has mild bilateral hydronephrosis which is not usual following ileal conduit creation, but if he condition declines, consider reimaging with contrast to assess degree of obstruction.  He would need percutaneous nephrostomy drainage if he isn't improving and significant obstruction is confirmed.    Possible pelvic abscess.   He has no suprapubic tenderness or wound erythema and radiology didn't feel it was a drainable collection at this time.  This could be reassess with repeat imaging as noted above if his condition declines.         LOS: 1 day    Irine Seal 01/13/2021 [phone] Patient ID: [name], male   DOB: 02-17-1946, 75 y.o.   MRN: [id number]

## 2021-01-13 NOTE — ED Notes (Signed)
Patient ambulatory in room. Brushing teeth at this time.

## 2021-01-13 NOTE — ED Notes (Signed)
Transport in to RM 243

## 2021-01-13 NOTE — Plan of Care (Signed)
Patient markedly improved requiring any pressors or supplemental oxygen.  No need for further ICU/ICU level care, patient to be switched to MedSurg status.  PCCM will sign off please reconsult as needed.  Discussed with Dr. Thereasa Solo via secure chat.  Renold Don, MD Advanced Bronchoscopy PCCM [facility]

## 2021-01-13 NOTE — ED Notes (Signed)
Patient called RN into room. Reports feeling funny and heart racing. EKG done and MD paged.

## 2021-01-13 NOTE — Progress Notes (Signed)
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  His constipation was corrected while he was in the ED and he was discharged from the ED to see his Urologist that same day.  Hours later he returned to the ED with complaints of fevers chills and suprapubic pain.  In the ED evaluation revealed evidence of bilateral pyelonephritis with developing perinephric abscess.  Transfer back to [facility] was recommended but patient insisted on being admitted to [facility].  Significant Events:  9/2 admit via ED  Consultants:  Urology  PCCM   Code Status: FULL CODE  Antimicrobials:  Rocephin 9/1 Zosyn 9/1 > Vancomycin 9/1 >  DVT prophylaxis: Lovenox  Subjective: The patient is presently afebrile.  Blood pressure systolic ranging 0000000.  Vital signs otherwise stable.  WBC normalized. He was noted to be in afib w/ HR ~120 this morning. He reported feeling palpitations and feeling unwell. Denies cp, n/v, abdom pain, or sob.   Assessment & Plan:  Sepsis POA due to Enterobacter cloacae acute pyelonephritis with bacteremia Blood pressure has stabilized with patient now off Levophed - cont volume resuscitation -continue directed antibiotic therapy  Status post cystectomy with ileal conduit creation 8/22 with possible cystectomy bed abscess Care per Urology -plan for outpatient follow-up imaging at [place] diagnosed Atrial fib w/ RVR Dose w/ BB and monitor rate - cont volume expansion - check TSH - Mg is normal - hold off on anticoag for now until clear is this persists (and in setting of recent surgery)  Acute hypoxic respiratory failure Unclear etiology - does not appear to be a parenchymal issue -  perhaps simply hypoventilation due to sepsis / lethargy? - resolved with patient now stable on room air  Normocytic anemia Likely due to acute illness and recent surgery -hemoglobin stable  Hyponatremia Likely simply due to volume depletion -corrected  GERD Continue usual home medical therapy  NAFLD   Family Communication: no family present at time of exam  Status is: Inpatient  Remains inpatient appropriate because:Inpatient level of care appropriate due to severity of illness  Dispo: The patient is from: Home              Anticipated d/c is to: Home              Patient currently is not medically stable to d/c.   Difficult to place patient No   Objective: Blood pressure (!) 97/54, pulse 93, temperature 98 F (36.7 C), temperature source Oral, resp. rate (!) 27, height 6' (1.829 m), weight 90.7 kg, SpO2 96 %.  Intake/Output Summary (Last 24 hours) at 01/13/2021 1020 Last data filed at 01/13/2021 0644 Gross per 24 hour  Intake 1376.6 ml  Output --  Net 1376.6 ml    Filed Weights   01/11/21 1941  Weight: 90.7 kg    Examination: General: No acute respiratory distress Lungs: Clear to auscultation bilaterally without wheezes or crackles Cardiovascular: irreg irreg w/ rate 100 at time of exam - no M  Abdomen: Nontender, nondistended, soft, bowel sounds positive, no rebound, no ascites, no appreciable mass Extremities: No significant cyanosis, clubbing, or edema bilateral lower extremities   CBC: Recent Labs  Lab 01/12/21 0415 01/12/21 0617 01/12/21 0736 01/12/21  1233  WBC 1.2* 3.8*  --  5.9  NEUTROABS 1.1* 3.7  --   --   HGB 7.9* 7.7* 7.9* 8.3*  HCT 23.6* 23.5* 25.0* 27.0*  MCV 88.4 89.0  --  92.8  PLT 177 162  --  [id number]    Basic Metabolic Panel: Recent Labs  Lab 01/12/21 0415 01/13/21 0459 01/13/21 0606  NA 136 139 140  K 3.9 3.9 4.1  CL 106 113* 109  CO2 21* 21* 23  GLUCOSE 96 96 104*  BUN '11 17 16  '$ CREATININE 0.89 0.71 0.77  CALCIUM 8.1* 7.5* 7.9*   MG  --  2.0 2.1    GFR: Estimated Creatinine Clearance: 88.9 mL/min (by C-G formula based on SCr of 0.77 mg/dL).  Liver Function Tests: Recent Labs  Lab 01/11/21 1947 01/12/21 0415 01/13/21 0459 01/13/21 0606  AST 21 22 45* 40  ALT '14 12 19 21  '$ ALKPHOS 30* 25* 27* 24*  BILITOT 1.0 0.7 0.4 0.3  PROT 5.9* 5.2* 5.0* 5.3*  ALBUMIN 3.1* 2.6* 2.4* 2.5*    Recent Labs  Lab 01/10/21 1946  LIPASE 37      Coagulation Profile: Recent Labs  Lab 01/12/21 0415  INR 1.4*      Recent Results (from the past 240 hour(s))  Urine Culture     Status: Abnormal   Collection Time: 01/10/21  7:46 PM   Specimen: Urine, Random  Result Value Ref Range Status   Specimen Description   Final    URINE, RANDOM Performed at [facility], [address], [place], [place] [address]    Special Requests   Final    NONE Performed at [facility], [address] [place], [place] [address]    Culture >=100,000 COLONIES/mL ENTEROBACTER CLOACAE (A)  Final   Report Status 01/13/2021 FINAL  Final   Organism ID, Bacteria ENTEROBACTER CLOACAE (A)  Final      Susceptibility   Enterobacter cloacae - MIC*    CEFAZOLIN >=64 RESISTANT Resistant     CEFEPIME <=0.12 SENSITIVE Sensitive     CIPROFLOXACIN <=0.25 SENSITIVE Sensitive     GENTAMICIN <=1 SENSITIVE Sensitive     IMIPENEM 0.5 SENSITIVE Sensitive     NITROFURANTOIN <=16 SENSITIVE Sensitive     TRIMETH/SULFA <=20 SENSITIVE Sensitive     PIP/TAZO <=4 SENSITIVE Sensitive     * >=100,000 COLONIES/mL ENTEROBACTER CLOACAE  Resp Panel by RT-PCR (Flu A&B, Covid) Nasopharyngeal Swab     Status: None   Collection Time: 01/12/21 12:55 AM   Specimen: Nasopharyngeal Swab; Nasopharyngeal(NP) swabs in vial transport medium  Result Value Ref Range Status   SARS Coronavirus 2 by RT PCR NEGATIVE NEGATIVE Final    Comment: (NOTE) SARS-CoV-2 target nucleic acids are NOT DETECTED.  The SARS-CoV-2 RNA is generally detectable in upper  respiratory specimens during the acute phase of infection. The lowest concentration of SARS-CoV-2 viral copies this assay can detect is 138 copies/mL. A negative result does not preclude SARS-Cov-2 infection and should not be used as the sole basis for treatment or other patient management decisions. A negative result may occur with  improper specimen collection/handling, submission of specimen other than nasopharyngeal swab, presence of viral mutation(s) within the areas targeted by this assay, and inadequate number of viral copies(<138 copies/mL). A negative result must be combined with clinical observations, patient history, and epidemiological information. The expected result is Negative.  Fact Sheet for Patients:  EntrepreneurPulse.com.au  Fact Sheet for Healthcare Providers:  IncredibleEmployment.be  This  test is no t yet approved or cleared by the [place] and  has been authorized for detection and/or diagnosis of SARS-CoV-2 by FDA under an Emergency Use Authorization (EUA). This EUA will remain  in effect (meaning this test can be used) for the duration of the COVID-19 declaration under Section 564(b)(1) of the Act, 21 U.S.C.section 360bbb-3(b)(1), unless the authorization is terminated  or revoked sooner.       Influenza A by PCR NEGATIVE NEGATIVE Final   Influenza B by PCR NEGATIVE NEGATIVE Final    Comment: (NOTE) The Xpert Xpress SARS-CoV-2/FLU/RSV plus assay is intended as an aid in the diagnosis of influenza from Nasopharyngeal swab specimens and should not be used as a sole basis for treatment. Nasal washings and aspirates are unacceptable for Xpert Xpress SARS-CoV-2/FLU/RSV testing.  Fact Sheet for Patients: EntrepreneurPulse.com.au  Fact Sheet for Healthcare Providers: IncredibleEmployment.be  This test is not yet approved or cleared by the [place] FDA and has been  authorized for detection and/or diagnosis of SARS-CoV-2 by FDA under an Emergency Use Authorization (EUA). This EUA will remain in effect (meaning this test can be used) for the duration of the COVID-19 declaration under Section 564(b)(1) of the Act, 21 U.S.C. section 360bbb-3(b)(1), unless the authorization is terminated or revoked.  Performed at [facility], [address] [place], [place] [address]   Blood Culture (routine x 2)     Status: Abnormal (Preliminary result)   Collection Time: 01/12/21  2:05 AM   Specimen: BLOOD  Result Value Ref Range Status   Specimen Description   Final    BLOOD RIGHT ASSIST CONTROL Performed at [facility], [address] [place], [place] [address]    Special Requests   Final    BOTTLES DRAWN AEROBIC AND ANAEROBIC Blood Culture adequate volume Performed at [facility], [address], [place], [place] [address]    Culture  Setup Time   Final    GRAM NEGATIVE RODS IN BOTH AEROBIC AND ANAEROBIC BOTTLES Organism ID to follow CRITICAL RESULT CALLED TO, READ BACK BY AND VERIFIED WITH: [facility] MITCHELL 01/12/21 1115 JGF    Culture (A)  Final    ENTEROBACTER CLOACAE SUSCEPTIBILITIES TO FOLLOW Performed at [facility], [place] [address], [place], [place] [address]    Report Status PENDING  Incomplete  Blood Culture ID Panel (Reflexed)     Status: Abnormal   Collection Time: 01/12/21  2:05 AM  Result Value Ref Range Status   Enterococcus faecalis NOT DETECTED NOT DETECTED Final   Enterococcus Faecium NOT DETECTED NOT DETECTED Final   Listeria monocytogenes NOT DETECTED NOT DETECTED Final   Staphylococcus species NOT DETECTED NOT DETECTED Final   Staphylococcus aureus (BCID) NOT DETECTED NOT DETECTED Final   Staphylococcus epidermidis NOT DETECTED NOT DETECTED Final   Staphylococcus lugdunensis NOT DETECTED NOT DETECTED Final   Streptococcus species NOT DETECTED NOT DETECTED Final   Streptococcus agalactiae NOT  DETECTED NOT DETECTED Final   Streptococcus pneumoniae NOT DETECTED NOT DETECTED Final   Streptococcus pyogenes NOT DETECTED NOT DETECTED Final   A.calcoaceticus-baumannii NOT DETECTED NOT DETECTED Final   Bacteroides fragilis NOT DETECTED NOT DETECTED Final   Enterobacterales DETECTED (A) NOT DETECTED Final    Comment: Enterobacterales represent a large order of gram negative bacteria, not a single organism.   Enterobacter cloacae complex DETECTED (A) NOT DETECTED Final    Comment: RESULT CALLED TO, READ BACK BY AND VERIFIED WITH: DEVIN MITCHELL 01/12/21 @ 1311 BY  SB    Escherichia coli NOT DETECTED NOT DETECTED Final   Klebsiella aerogenes NOT DETECTED NOT DETECTED Final   Klebsiella oxytoca NOT DETECTED NOT DETECTED Final   Klebsiella pneumoniae NOT DETECTED NOT DETECTED Final   Proteus species NOT DETECTED NOT DETECTED Final   Salmonella species NOT DETECTED NOT DETECTED Final   Serratia marcescens NOT DETECTED NOT DETECTED Final   Haemophilus influenzae NOT DETECTED NOT DETECTED Final   Neisseria meningitidis NOT DETECTED NOT DETECTED Final   Pseudomonas aeruginosa NOT DETECTED NOT DETECTED Final   Stenotrophomonas maltophilia NOT DETECTED NOT DETECTED Final   Candida albicans NOT DETECTED NOT DETECTED Final   Candida auris NOT DETECTED NOT DETECTED Final   Candida glabrata NOT DETECTED NOT DETECTED Final   Candida krusei NOT DETECTED NOT DETECTED Final   Candida parapsilosis NOT DETECTED NOT DETECTED Final   Candida tropicalis NOT DETECTED NOT DETECTED Final   Cryptococcus neoformans/gattii NOT DETECTED NOT DETECTED Final   CTX-M ESBL NOT DETECTED NOT DETECTED Final   Carbapenem resistance IMP NOT DETECTED NOT DETECTED Final   Carbapenem resistance KPC NOT DETECTED NOT DETECTED Final   Carbapenem resistance NDM NOT DETECTED NOT DETECTED Final   Carbapenem resist OXA 48 LIKE NOT DETECTED NOT DETECTED Final   Carbapenem resistance VIM NOT DETECTED NOT DETECTED Final    Comment:  Performed at [facility], [address] [place], [place] [address]  Blood Culture (routine x 2)     Status: None (Preliminary result)   Collection Time: 01/12/21  2:20 AM   Specimen: BLOOD  Result Value Ref Range Status   Specimen Description   Final    BLOOD LEFT ASSIST CONTROL Performed at [facility], [address] [place], [place] [address]    Special Requests   Final    BOTTLES DRAWN AEROBIC AND ANAEROBIC Blood Culture results may not be optimal due to an excessive volume of blood received in culture bottles Performed at [facility], [address] [place], [place] [address]    Culture  Setup Time   Final    GRAM NEGATIVE RODS IN BOTH AEROBIC AND ANAEROBIC BOTTLES CRITICAL VALUE NOTED.  VALUE IS CONSISTENT WITH PREVIOUSLY REPORTED AND CALLED VALUE.    Culture GRAM NEGATIVE RODS  Final   Report Status PENDING  Incomplete  MRSA Next Gen by PCR, Nasal     Status: None   Collection Time: 01/12/21  4:15 AM   Specimen: Nasal Mucosa; Nasal Swab  Result Value Ref Range Status   MRSA by PCR Next Gen NOT DETECTED NOT DETECTED Final    Comment: (NOTE) The GeneXpert MRSA Assay (FDA approved for NASAL specimens only), is one component of a comprehensive MRSA colonization surveillance program. It is not intended to diagnose MRSA infection nor to guide or monitor treatment for MRSA infections. Test performance is not FDA approved in patients less than 50 years old. Performed at [facility], [address] [place], [place] [address]       Scheduled Meds:  pantoprazole (PROTONIX) IV  40 mg Intravenous Q24H   sodium chloride flush  3 mL Intravenous Q12H   Continuous Infusions:  ceFEPime (MAXIPIME) IV Stopped (01/13/21 ED:[id number])   metronidazole 500 mg (01/13/21 1000)   norepinephrine (LEVOPHED) Adult infusion Stopped (01/12/21 2215)     LOS: 1 day   Cherene Altes, MD [facility] Office  [phone] Pager - Text Page per  Shea Evans  If 7PM-7AM, please contact night-coverage per Amion 01/13/2021, 10:20 AM

## 2021-01-14 ENCOUNTER — Encounter: Payer: Self-pay | Admitting: Internal Medicine

## 2021-01-14 DIAGNOSIS — R509 Fever, unspecified: Secondary | ICD-10-CM

## 2021-01-14 LAB — CULTURE, BLOOD (ROUTINE X 2): Special Requests: ADEQUATE

## 2021-01-14 LAB — COMPREHENSIVE METABOLIC PANEL
ALT: 19 U/L (ref 0–44)
AST: 32 U/L (ref 15–41)
Albumin: 2.4 g/dL — ABNORMAL LOW (ref 3.5–5.0)
Alkaline Phosphatase: 25 U/L — ABNORMAL LOW (ref 38–126)
Anion gap: 6 (ref 5–15)
BUN: 12 mg/dL (ref 8–23)
CO2: 20 mmol/L — ABNORMAL LOW (ref 22–32)
Calcium: 7.8 mg/dL — ABNORMAL LOW (ref 8.9–10.3)
Chloride: 116 mmol/L — ABNORMAL HIGH (ref 98–111)
Creatinine, Ser: 0.81 mg/dL (ref 0.61–1.24)
GFR, Estimated: >60 mL/min (ref >60)
Glucose, Bld: 122 mg/dL — ABNORMAL HIGH (ref 70–99)
Potassium: 4.3 mmol/L (ref 3.5–5.1)
Sodium: 142 mmol/L (ref 135–145)
Total Bilirubin: 0.3 mg/dL (ref 0.3–1.2)
Total Protein: 4.9 g/dL — ABNORMAL LOW (ref 6.5–8.1)

## 2021-01-14 LAB — CBC
HCT: 26.8 % — ABNORMAL LOW (ref 39.0–52.0)
Hemoglobin: 8.4 g/dL — ABNORMAL LOW (ref 13.0–17.0)
MCH: 28.5 pg (ref 26.0–34.0)
MCHC: 31.3 g/dL (ref 30.0–36.0)
MCV: 90.8 fL (ref 80.0–100.0)
Platelets: 161 10*3/uL (ref 150–400)
RBC: 2.95 MIL/uL — ABNORMAL LOW (ref 4.22–5.81)
RDW: 15 % (ref 11.5–15.5)
WBC: 5.1 10*3/uL (ref 4.0–10.5)
nRBC: 0 % (ref 0.0–0.2)

## 2021-01-14 LAB — LACTIC ACID, PLASMA: Lactic Acid, Venous: 1.6 mmol/L (ref 0.5–1.9)

## 2021-01-14 LAB — CORTISOL: Cortisol, Plasma: 13.3 ug/dL

## 2021-01-14 MED ORDER — DILTIAZEM HCL 30 MG PO TABS
60.0000 mg | ORAL_TABLET | Freq: Four times a day (QID) | ORAL | Status: DC
  Administered 2021-01-14 – 2021-01-15 (×5): 60 mg via ORAL
  Filled 2021-01-14 (×5): qty 2

## 2021-01-14 MED ORDER — CIPROFLOXACIN HCL 500 MG PO TABS
500.0000 mg | ORAL_TABLET | Freq: Two times a day (BID) | ORAL | Status: DC
  Administered 2021-01-15: 500 mg via ORAL
  Filled 2021-01-14 (×2): qty 1

## 2021-01-14 NOTE — Progress Notes (Signed)
Patient admitted to room 243 from ED. Patient alert and oriented, no distress noted.  Patient oriented to room, assisted to bed in lowest locked position with call bell and personal belongings within reach.

## 2021-01-14 NOTE — Progress Notes (Signed)
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  His constipation was corrected while he was in the ED and he was discharged from the ED to see his Urologist that same day.  Hours later he returned to the ED with complaints of fevers chills and suprapubic pain.  In the ED evaluation revealed evidence of bilateral pyelonephritis with developing perinephric abscess.  Transfer back to [facility] was recommended but patient insisted on being admitted to [facility].  Significant Events:  9/2 admit via ED  Consultants:  Urology  PCCM   Code Status: FULL CODE  Antimicrobials:  Rocephin 9/1 Zosyn 9/1 > Vancomycin 9/1 >  DVT prophylaxis: Lovenox  Subjective: No acute events reported overnight.  Remains tachycardic with persisting atrial fibrillation on monitor.  Heart rate at times up to 150.  Blood pressure stable presently.  Afebrile.  Appears much improved at the time of my exam.  Denies abdominal pain shortness of breath or recurrent palpitations.  Auscultation of the time of my exam suggest NSR.  Assessment & Plan:  Sepsis POA due to Enterobacter cloacae acute pyelonephritis with bacteremia Blood pressure has stabilized with patient now off Levophed - cont volume resuscitation -continue directed antibiotic therapy  Status post cystectomy with ileal conduit creation 8/22 with possible cystectomy bed abscess Care per Urology -plan for outpatient follow-up imaging at [place] [facility] diagnosed Atrial fib w/ RVR Initially persisted despite volume expansion and correction of sepsis - TSH normal- Mg is normal - initiated Cardizem -discontinued beta-blocker -discussed use of anticoagulant with patient  -CHA2DS2-VASc is at least 1 -on exam today however patient was found to be in NSR -we will withhold anticoagulation for now and this will need to be followed up in the outpatient setting  ?Grade III Diastolic CHF Reported on TTE -no evidence on clinical exam of significant CHF -Dr. Without change  Acute hypoxic respiratory failure Unclear etiology - did not appear to be a parenchymal issue - perhaps simply hypoventilation due to sepsis / lethargy? - resolved with patient now stable on room air  Normocytic anemia Likely due to acute illness and recent surgery -hemoglobin stable  Hyponatremia Likely simply due to volume depletion -corrected  GERD Continue usual home medical therapy  NAFLD   Family Communication: no family present at time of exam  Status is: Inpatient  Remains inpatient appropriate because:Inpatient level of care appropriate due to severity of illness  Dispo: The patient is from: Home              Anticipated d/c is to: Home              Patient currently is not medically stable to d/c.   Difficult to place patient No   Objective: Blood pressure 111/78, pulse 61, temperature 98 F (36.7 C), resp. rate 18, height 6' (1.829 m), weight 94 kg, SpO2 100 %.  Intake/Output Summary (Last 24 hours) at 01/14/2021 0900 Last data filed at 01/13/2021 2231 Gross per 24 hour  Intake 1400 ml  Output 1175 ml  Net 225 ml    Filed Weights   01/11/21 1941 01/13/21 2226  Weight: 90.7 kg 94 kg    Examination: General: No acute respiratory distress Lungs: Clear to auscultation  bilaterally without wheezing Cardiovascular: RRR without murmur Abdomen: NT/ND, soft, BS positive, no rebound Extremities: No significant cyanosis, clubbing, or edema bilateral lower extremities   CBC: Recent Labs  Lab 01/12/21 0415 01/12/21 0617 01/12/21 0736 01/12/21 1233 01/13/21 0630 01/14/21 0511  WBC 1.2* 3.8*  --  5.9 5.3 5.1  NEUTROABS 1.1* 3.7  --   --   --   --   HGB 7.9* 7.7*   <  > 8.3* 8.6* 8.4*  HCT 23.6* 23.5*   < > 27.0* 27.0* 26.8*  MCV 88.4 89.0  --  92.8 91.2 90.8  PLT 177 162  --  169 155 161   < > = values in this interval not displayed.    Basic Metabolic Panel: Recent Labs  Lab 01/13/21 0459 01/13/21 0606 01/14/21 0511  NA 139 140 142  K 3.9 4.1 4.3  CL 113* 109 116*  CO2 21* 23 20*  GLUCOSE 96 104* 122*  BUN '17 16 12  '$ CREATININE 0.71 0.77 0.81  CALCIUM 7.5* 7.9* 7.8*  MG 2.0 2.1  --     GFR: Estimated Creatinine Clearance: 95.3 mL/min (by C-G formula based on SCr of 0.81 mg/dL).  Liver Function Tests: Recent Labs  Lab 01/12/21 0415 01/13/21 0459 01/13/21 0606 01/14/21 0511  AST 22 45* 40 32  ALT '12 19 21 19  '$ ALKPHOS 25* 27* 24* 25*  BILITOT 0.7 0.4 0.3 0.3  PROT 5.2* 5.0* 5.3* 4.9*  ALBUMIN 2.6* 2.4* 2.5* 2.4*    Recent Labs  Lab 01/10/21 1946  LIPASE 37     Coagulation Profile: Recent Labs  Lab 01/12/21 0415  INR 1.4*     Recent Results (from the past 240 hour(s))  Urine Culture     Status: Abnormal   Collection Time: 01/10/21  7:46 PM   Specimen: Urine, Random  Result Value Ref Range Status   Specimen Description   Final    URINE, RANDOM Performed at [facility], [address], [place], [place] [address]    Special Requests   Final    NONE Performed at [facility], [address] [place], [place] [address]    Culture >=100,000 COLONIES/mL ENTEROBACTER CLOACAE (A)  Final   Report Status 01/13/2021 FINAL  Final   Organism ID, Bacteria ENTEROBACTER CLOACAE (A)  Final      Susceptibility   Enterobacter cloacae - MIC*    CEFAZOLIN >=64 RESISTANT Resistant     CEFEPIME <=0.12 SENSITIVE Sensitive     CIPROFLOXACIN <=0.25 SENSITIVE Sensitive     GENTAMICIN <=1 SENSITIVE Sensitive     IMIPENEM 0.5 SENSITIVE Sensitive     NITROFURANTOIN <=16 SENSITIVE Sensitive     TRIMETH/SULFA <=20 SENSITIVE Sensitive     PIP/TAZO <=4 SENSITIVE Sensitive     * >=100,000 COLONIES/mL ENTEROBACTER  CLOACAE  Resp Panel by RT-PCR (Flu A&B, Covid) Nasopharyngeal Swab     Status: None   Collection Time: 01/12/21 12:55 AM   Specimen: Nasopharyngeal Swab; Nasopharyngeal(NP) swabs in vial transport medium  Result Value Ref Range Status   SARS Coronavirus 2 by RT PCR NEGATIVE NEGATIVE Final    Comment: (NOTE) SARS-CoV-2 target nucleic acids are NOT DETECTED.  The SARS-CoV-2 RNA is generally detectable in upper respiratory specimens during the acute phase of infection. The lowest concentration of SARS-CoV-2 viral copies this assay can detect is 138 copies/mL. A negative result does not preclude SARS-Cov-2 infection and should not be used as the sole basis for treatment or other patient  management decisions. A negative result may occur with  improper specimen collection/handling, submission of specimen other than nasopharyngeal swab, presence of viral mutation(s) within the areas targeted by this assay, and inadequate number of viral copies(<138 copies/mL). A negative result must be combined with clinical observations, patient history, and epidemiological information. The expected result is Negative.  Fact Sheet for Patients:  EntrepreneurPulse.com.au  Fact Sheet for Healthcare Providers:  IncredibleEmployment.be  This test is no t yet approved or cleared by the [place] FDA and  has been authorized for detection and/or diagnosis of SARS-CoV-2 by FDA under an Emergency Use Authorization (EUA). This EUA will remain  in effect (meaning this test can be used) for the duration of the COVID-19 declaration under Section 564(b)(1) of the Act, 21 U.S.C.section 360bbb-3(b)(1), unless the authorization is terminated  or revoked sooner.       Influenza A by PCR NEGATIVE NEGATIVE Final   Influenza B by PCR NEGATIVE NEGATIVE Final    Comment: (NOTE) The Xpert Xpress SARS-CoV-2/FLU/RSV plus assay is intended as an aid in the diagnosis of influenza from  Nasopharyngeal swab specimens and should not be used as a sole basis for treatment. Nasal washings and aspirates are unacceptable for Xpert Xpress SARS-CoV-2/FLU/RSV testing.  Fact Sheet for Patients: EntrepreneurPulse.com.au  Fact Sheet for Healthcare Providers: IncredibleEmployment.be  This test is not yet approved or cleared by the [place] FDA and has been authorized for detection and/or diagnosis of SARS-CoV-2 by FDA under an Emergency Use Authorization (EUA). This EUA will remain in effect (meaning this test can be used) for the duration of the COVID-19 declaration under Section 564(b)(1) of the Act, 21 U.S.C. section 360bbb-3(b)(1), unless the authorization is terminated or revoked.  Performed at [facility], [address], [place], [place] [address]   Blood Culture (routine x 2)     Status: Abnormal (Preliminary result)   Collection Time: 01/12/21  2:05 AM   Specimen: BLOOD  Result Value Ref Range Status   Specimen Description   Final    BLOOD RIGHT ASSIST CONTROL Performed at [facility], [address], [place], [place] [address]    Special Requests   Final    BOTTLES DRAWN AEROBIC AND ANAEROBIC Blood Culture adequate volume Performed at [facility], [address] [place], [place] [address]    Culture  Setup Time   Final    GRAM NEGATIVE RODS IN BOTH AEROBIC AND ANAEROBIC BOTTLES Organism ID to follow CRITICAL RESULT CALLED TO, READ BACK BY AND VERIFIED WITH: [facility] MITCHELL 01/12/21 1115 JGF    Culture (A)  Final    ENTEROBACTER CLOACAE SUSCEPTIBILITIES TO FOLLOW Performed at [facility], [address], [place], [place] [address]    Report Status PENDING  Incomplete  Blood Culture ID Panel (Reflexed)     Status: Abnormal   Collection Time: 01/12/21  2:05 AM  Result Value Ref Range Status   Enterococcus faecalis NOT DETECTED NOT DETECTED Final   Enterococcus Faecium NOT  DETECTED NOT DETECTED Final   Listeria monocytogenes NOT DETECTED NOT DETECTED Final   Staphylococcus species NOT DETECTED NOT DETECTED Final   Staphylococcus aureus (BCID) NOT DETECTED NOT DETECTED Final   Staphylococcus epidermidis NOT DETECTED NOT DETECTED Final   Staphylococcus lugdunensis NOT DETECTED NOT DETECTED Final   Streptococcus species NOT DETECTED NOT DETECTED Final   Streptococcus agalactiae NOT DETECTED NOT DETECTED Final   Streptococcus pneumoniae NOT DETECTED NOT DETECTED Final   Streptococcus pyogenes NOT DETECTED NOT  DETECTED Final   A.calcoaceticus-baumannii NOT DETECTED NOT DETECTED Final   Bacteroides fragilis NOT DETECTED NOT DETECTED Final   Enterobacterales DETECTED (A) NOT DETECTED Final    Comment: Enterobacterales represent a large order of gram negative bacteria, not a single organism.   Enterobacter cloacae complex DETECTED (A) NOT DETECTED Final    Comment: RESULT CALLED TO, READ BACK BY AND VERIFIED WITH: DEVIN MITCHELL 01/12/21 @ 1311 BY SB    Escherichia coli NOT DETECTED NOT DETECTED Final   Klebsiella aerogenes NOT DETECTED NOT DETECTED Final   Klebsiella oxytoca NOT DETECTED NOT DETECTED Final   Klebsiella pneumoniae NOT DETECTED NOT DETECTED Final   Proteus species NOT DETECTED NOT DETECTED Final   Salmonella species NOT DETECTED NOT DETECTED Final   Serratia marcescens NOT DETECTED NOT DETECTED Final   Haemophilus influenzae NOT DETECTED NOT DETECTED Final   Neisseria meningitidis NOT DETECTED NOT DETECTED Final   Pseudomonas aeruginosa NOT DETECTED NOT DETECTED Final   Stenotrophomonas maltophilia NOT DETECTED NOT DETECTED Final   Candida albicans NOT DETECTED NOT DETECTED Final   Candida auris NOT DETECTED NOT DETECTED Final   Candida glabrata NOT DETECTED NOT DETECTED Final   Candida krusei NOT DETECTED NOT DETECTED Final   Candida parapsilosis NOT DETECTED NOT DETECTED Final   Candida tropicalis NOT DETECTED NOT DETECTED Final    Cryptococcus neoformans/gattii NOT DETECTED NOT DETECTED Final   CTX-M ESBL NOT DETECTED NOT DETECTED Final   Carbapenem resistance IMP NOT DETECTED NOT DETECTED Final   Carbapenem resistance KPC NOT DETECTED NOT DETECTED Final   Carbapenem resistance NDM NOT DETECTED NOT DETECTED Final   Carbapenem resist OXA 48 LIKE NOT DETECTED NOT DETECTED Final   Carbapenem resistance VIM NOT DETECTED NOT DETECTED Final    Comment: Performed at [facility], [address] [place], [place] [address]  Blood Culture (routine x 2)     Status: None (Preliminary result)   Collection Time: 01/12/21  2:20 AM   Specimen: BLOOD  Result Value Ref Range Status   Specimen Description   Final    BLOOD LEFT ASSIST CONTROL Performed at [facility], [address] [place], [place] [address]    Special Requests   Final    BOTTLES DRAWN AEROBIC AND ANAEROBIC Blood Culture results may not be optimal due to an excessive volume of blood received in culture bottles Performed at [facility], [address] [place], [place] [address]    Culture  Setup Time   Final    GRAM NEGATIVE RODS IN BOTH AEROBIC AND ANAEROBIC BOTTLES CRITICAL VALUE NOTED.  VALUE IS CONSISTENT WITH PREVIOUSLY REPORTED AND CALLED VALUE.    Culture   Final    GRAM NEGATIVE RODS CULTURE REINCUBATED FOR BETTER GROWTH Performed at [facility], [place] [address], [place], [place] [address]    Report Status PENDING  Incomplete  Urine Culture     Status: Abnormal   Collection Time: 01/12/21  4:15 AM   Specimen: Urine, Random  Result Value Ref Range Status   Specimen Description   Final    URINE, RANDOM Performed at [facility], [address] [place], [place] [address]    Special Requests   Final    NONE Performed at [facility], [address] [place], [place] [address]    Culture MULTIPLE SPECIES PRESENT, SUGGEST RECOLLECTION (A)  Final   Report Status 01/13/2021 FINAL  Final   MRSA Next Gen by PCR, Nasal  Status: None   Collection Time: 01/12/21  4:15 AM   Specimen: Nasal Mucosa; Nasal Swab  Result Value Ref Range Status   MRSA by PCR Next Gen NOT DETECTED NOT DETECTED Final    Comment: (NOTE) The GeneXpert MRSA Assay (FDA approved for NASAL specimens only), is one component of a comprehensive MRSA colonization surveillance program. It is not intended to diagnose MRSA infection nor to guide or monitor treatment for MRSA infections. Test performance is not FDA approved in patients less than 72 years old. Performed at [facility], [address] [place], [place] [address]       Scheduled Meds:  aspirin EC  81 mg Oral Daily   atorvastatin  10 mg Oral Daily   enoxaparin (LOVENOX) injection  40 mg Subcutaneous Q24H   metoprolol tartrate  5 mg Intravenous Q6H   pantoprazole  40 mg Oral Daily   polyethylene glycol  17 g Oral Daily   senna-docusate  1 tablet Oral BID   sodium chloride flush  3 mL Intravenous Q12H   Continuous Infusions:  sodium chloride 150 mL/hr at 01/13/21 1211   ceFEPime (MAXIPIME) IV 2 g (01/14/21 0625)   metronidazole 500 mg (01/14/21 0400)     LOS: 2 days   Cherene Altes, MD [facility] Office  [phone] Pager - Text Page per Shea Evans  If 7PM-7AM, please contact night-coverage per Amion 01/14/2021, 9:00 AM

## 2021-01-14 NOTE — Progress Notes (Signed)
Patient refused CPAP for the night  

## 2021-01-14 NOTE — Progress Notes (Signed)
Tony's vitals continue to improve and stabilize. WBC and Cr normal. Good uop. Enterobacter on blood cultures. Rec to continue abx per primary team and continue current care as he continues to improve. Please page GU with any questions, concerns or changes in patient status.

## 2021-01-15 DIAGNOSIS — N12 Tubulo-interstitial nephritis, not specified as acute or chronic: Secondary | ICD-10-CM

## 2021-01-15 DIAGNOSIS — A419 Sepsis, unspecified organism: Secondary | ICD-10-CM

## 2021-01-15 DIAGNOSIS — N1330 Unspecified hydronephrosis: Secondary | ICD-10-CM

## 2021-01-15 LAB — CULTURE, BLOOD (ROUTINE X 2)

## 2021-01-15 MED ORDER — CIPROFLOXACIN HCL 500 MG PO TABS: 500.0000 mg | ORAL_TABLET | Freq: Two times a day (BID) | ORAL | 0 refills | Status: DC

## 2021-01-15 NOTE — Progress Notes (Signed)
Subjective: Patient reports he is feeling better.  He is ambulating.  He is on p.o. Cipro.  He did note some drainage per urethra which has subsided or significantly diminished.  Objective: Vital signs in last 24 hours: Temp:  [98 F (36.7 C)-98.7 F (37.1 C)] 98.2 F (36.8 C) (09/05 1151) Pulse Rate:  [71-87] 71 (09/05 1151) Resp:  [17-18] 17 (09/05 1151) BP: (110-132)/(67-80) 132/80 (09/05 1151) SpO2:  [98 %-100 %] 100 % (09/05 1151)  Intake/Output from previous day: 09/04 0701 - 09/05 0700 In: 835.1 [P.O.:240; IV Piggyback:595.1] Out: 2225 [Urine:2225] Intake/Output this shift: Total I/O In: 480 [P.O.:480] Out: 900 [Urine:900]  Physical Exam:  No acute distress-sitting in chair watching TV Abdomen soft and nontender-ileal conduit pink and viable.  Midline lower incision clean dry and intact. GU-perineum soft and nontender on palpation with no penile drainage.   Lab Results: Recent Labs    01/12/21 1233 01/13/21 0630 01/14/21 0511  HGB 8.3* 8.6* 8.4*  HCT 27.0* 27.0* 26.8*   BMET Recent Labs    01/13/21 0606 01/14/21 0511  NA 140 142  K 4.1 4.3  CL 109 116*  CO2 23 20*  GLUCOSE 104* 122*  BUN 16 12  CREATININE 0.77 0.81  CALCIUM 7.9* 7.8*   No results for input(s): LABPT, INR in the last 72 hours. No results for input(s): LABURIN in the last 72 hours. Results for orders placed or performed during the hospital encounter of 01/12/21  Resp Panel by RT-PCR (Flu A&B, Covid) Nasopharyngeal Swab     Status: None   Collection Time: 01/12/21 12:55 AM   Specimen: Nasopharyngeal Swab; Nasopharyngeal(NP) swabs in vial transport medium  Result Value Ref Range Status   SARS Coronavirus 2 by RT PCR NEGATIVE NEGATIVE Final    Comment: (NOTE) SARS-CoV-2 target nucleic acids are NOT DETECTED.  The SARS-CoV-2 RNA is generally detectable in upper respiratory specimens during the acute phase of infection. The lowest concentration of SARS-CoV-2 viral copies this assay  can detect is 138 copies/mL. A negative result does not preclude SARS-Cov-2 infection and should not be used as the sole basis for treatment or other patient management decisions. A negative result may occur with  improper specimen collection/handling, submission of specimen other than nasopharyngeal swab, presence of viral mutation(s) within the areas targeted by this assay, and inadequate number of viral copies(<138 copies/mL). A negative result must be combined with clinical observations, patient history, and epidemiological information. The expected result is Negative.  Fact Sheet for Patients:  EntrepreneurPulse.com.au  Fact Sheet for Healthcare Providers:  IncredibleEmployment.be  This test is no t yet approved or cleared by the [place] FDA and  has been authorized for detection and/or diagnosis of SARS-CoV-2 by FDA under an Emergency Use Authorization (EUA). This EUA will remain  in effect (meaning this test can be used) for the duration of the COVID-19 declaration under Section 564(b)(1) of the Act, 21 U.S.C.section 360bbb-3(b)(1), unless the authorization is terminated  or revoked sooner.       Influenza A by PCR NEGATIVE NEGATIVE Final   Influenza B by PCR NEGATIVE NEGATIVE Final    Comment: (NOTE) The Xpert Xpress SARS-CoV-2/FLU/RSV plus assay is intended as an aid in the diagnosis of influenza from Nasopharyngeal swab specimens and should not be used as a sole basis for treatment. Nasal washings and aspirates are unacceptable for Xpert Xpress SARS-CoV-2/FLU/RSV testing.  Fact Sheet for Patients: EntrepreneurPulse.com.au  Fact Sheet for Healthcare Providers: IncredibleEmployment.be  This test is not yet approved  or cleared by the [place] and has been authorized for detection and/or diagnosis of SARS-CoV-2 by FDA under an Emergency Use Authorization (EUA). This EUA will  remain in effect (meaning this test can be used) for the duration of the COVID-19 declaration under Section 564(b)(1) of the Act, 21 U.S.C. section 360bbb-3(b)(1), unless the authorization is terminated or revoked.  Performed at [facility], [address] [place], [place] [address]   Blood Culture (routine x 2)     Status: Abnormal   Collection Time: 01/12/21  2:05 AM   Specimen: BLOOD  Result Value Ref Range Status   Specimen Description   Final    BLOOD RIGHT ASSIST CONTROL Performed at [facility], [address] [place], [place] [address]    Special Requests   Final    BOTTLES DRAWN AEROBIC AND ANAEROBIC Blood Culture adequate volume Performed at [facility], [address], [place], [place] [address]    Culture  Setup Time   Final    GRAM NEGATIVE RODS IN BOTH AEROBIC AND ANAEROBIC BOTTLES Organism ID to follow CRITICAL RESULT CALLED TO, READ BACK BY AND VERIFIED WITH: [facility] MITCHELL 01/12/21 1115 JGF    Culture ENTEROBACTER CLOACAE (A)  Final   Report Status 01/14/2021 FINAL  Final   Organism ID, Bacteria ENTEROBACTER CLOACAE  Final      Susceptibility   Enterobacter cloacae - MIC*    CEFAZOLIN >=64 RESISTANT Resistant     CEFEPIME <=0.12 SENSITIVE Sensitive     CEFTAZIDIME <=1 SENSITIVE Sensitive     CIPROFLOXACIN <=0.25 SENSITIVE Sensitive     GENTAMICIN <=1 SENSITIVE Sensitive     IMIPENEM 0.5 SENSITIVE Sensitive     TRIMETH/SULFA <=20 SENSITIVE Sensitive     PIP/TAZO <=4 SENSITIVE Sensitive     * ENTEROBACTER CLOACAE  Blood Culture ID Panel (Reflexed)     Status: Abnormal   Collection Time: 01/12/21  2:05 AM  Result Value Ref Range Status   Enterococcus faecalis NOT DETECTED NOT DETECTED Final   Enterococcus Faecium NOT DETECTED NOT DETECTED Final   Listeria monocytogenes NOT DETECTED NOT DETECTED Final   Staphylococcus species NOT DETECTED NOT DETECTED Final   Staphylococcus aureus (BCID) NOT DETECTED NOT DETECTED Final    Staphylococcus epidermidis NOT DETECTED NOT DETECTED Final   Staphylococcus lugdunensis NOT DETECTED NOT DETECTED Final   Streptococcus species NOT DETECTED NOT DETECTED Final   Streptococcus agalactiae NOT DETECTED NOT DETECTED Final   Streptococcus pneumoniae NOT DETECTED NOT DETECTED Final   Streptococcus pyogenes NOT DETECTED NOT DETECTED Final   A.calcoaceticus-baumannii NOT DETECTED NOT DETECTED Final   Bacteroides fragilis NOT DETECTED NOT DETECTED Final   Enterobacterales DETECTED (A) NOT DETECTED Final    Comment: Enterobacterales represent a large order of gram negative bacteria, not a single organism.   Enterobacter cloacae complex DETECTED (A) NOT DETECTED Final    Comment: RESULT CALLED TO, READ BACK BY AND VERIFIED WITH: DEVIN MITCHELL 01/12/21 @ 1311 BY SB    Escherichia coli NOT DETECTED NOT DETECTED Final   Klebsiella aerogenes NOT DETECTED NOT DETECTED Final   Klebsiella oxytoca NOT DETECTED NOT DETECTED Final   Klebsiella pneumoniae NOT DETECTED NOT DETECTED Final   Proteus species NOT DETECTED NOT DETECTED Final   Salmonella species NOT DETECTED NOT DETECTED Final   Serratia marcescens NOT DETECTED NOT DETECTED Final   Haemophilus influenzae NOT DETECTED NOT DETECTED Final   Neisseria meningitidis NOT DETECTED NOT DETECTED Final   Pseudomonas aeruginosa NOT DETECTED  NOT DETECTED Final   Stenotrophomonas maltophilia NOT DETECTED NOT DETECTED Final   Candida albicans NOT DETECTED NOT DETECTED Final   Candida auris NOT DETECTED NOT DETECTED Final   Candida glabrata NOT DETECTED NOT DETECTED Final   Candida krusei NOT DETECTED NOT DETECTED Final   Candida parapsilosis NOT DETECTED NOT DETECTED Final   Candida tropicalis NOT DETECTED NOT DETECTED Final   Cryptococcus neoformans/gattii NOT DETECTED NOT DETECTED Final   CTX-M ESBL NOT DETECTED NOT DETECTED Final   Carbapenem resistance IMP NOT DETECTED NOT DETECTED Final   Carbapenem resistance KPC NOT DETECTED NOT  DETECTED Final   Carbapenem resistance NDM NOT DETECTED NOT DETECTED Final   Carbapenem resist OXA 48 LIKE NOT DETECTED NOT DETECTED Final   Carbapenem resistance VIM NOT DETECTED NOT DETECTED Final    Comment: Performed at [facility], [address] [place], [place] [address]  Blood Culture (routine x 2)     Status: Abnormal   Collection Time: 01/12/21  2:20 AM   Specimen: BLOOD  Result Value Ref Range Status   Specimen Description   Final    BLOOD LEFT ASSIST CONTROL Performed at [facility], [address] [place], [place] [address]    Special Requests   Final    BOTTLES DRAWN AEROBIC AND ANAEROBIC Blood Culture results may not be optimal due to an excessive volume of blood received in culture bottles Performed at [facility], [address] [place], [place] [address]    Culture  Setup Time   Final    GRAM NEGATIVE RODS IN BOTH AEROBIC AND ANAEROBIC BOTTLES CRITICAL VALUE NOTED.  VALUE IS CONSISTENT WITH PREVIOUSLY REPORTED AND CALLED VALUE.    Culture (A)  Final    ENTEROBACTER CLOACAE SUSCEPTIBILITIES PERFORMED ON PREVIOUS CULTURE WITHIN THE LAST 5 DAYS. Performed at [facility], [place] [address], [place], [place] [address]    Report Status 01/15/2021 FINAL  Final  Urine Culture     Status: Abnormal   Collection Time: 01/12/21  4:15 AM   Specimen: Urine, Random  Result Value Ref Range Status   Specimen Description   Final    URINE, RANDOM Performed at [facility], [address] [place], [place] [address]    Special Requests   Final    NONE Performed at [facility], [address] [place], [place] [address]    Culture MULTIPLE SPECIES PRESENT, SUGGEST RECOLLECTION (A)  Final   Report Status 01/13/2021 FINAL  Final  MRSA Next Gen by PCR, Nasal     Status: None   Collection Time: 01/12/21  4:15 AM   Specimen: Nasal Mucosa; Nasal Swab  Result Value Ref Range Status   MRSA by PCR Next Gen NOT DETECTED NOT  DETECTED Final    Comment: (NOTE) The GeneXpert MRSA Assay (FDA approved for NASAL specimens only), is one component of a comprehensive MRSA colonization surveillance program. It is not intended to diagnose MRSA infection nor to guide or monitor treatment for MRSA infections. Test performance is not FDA approved in patients less than 58 years old. Performed at [facility], [address] [place], [place] [address]     Studies/Results: No results found.  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  Improving.  Creatinine yesterday was normal.  Doing well on p.o. Cipro for Enterobacter on blood cultures.  Continue full treatment course.   Possible  pelvic abscess -patient has improved and now on p.o. antibiotics.  Some of the fluid was "drained" via his urethra.  I am not certain the utility of a repeat scan at this point since he is clinically improved.  He has no worrisome findings on exam.  I rec to continue p.o. antibiotics and follow clinically.   LOS: 3 days   Festus Aloe 01/15/2021, 12:20 PM

## 2021-01-15 NOTE — Care Management Important Message (Signed)
Important Message  Patient Details  Name: PAVAN STOPKA MRN: [id number] Date of Birth: [birth year]   Medicare Important Message Given:  Yes     Juliann Pulse A Melony Tenpas 01/15/2021, 1:42 PM

## 2021-01-15 NOTE — Discharge Summary (Signed)
DISCHARGE SUMMARY  ARTA BAZ  MR#: [id number]  DOB:Oct 02, 1945  Date of Admission: 01/12/2021 Date of Discharge: 01/15/2021  Attending Physician:Tavita Eastham Hennie Duos, MD  Patient's EG:[id number], Beverely Low, MD  Consults: Urology  PCCM  Disposition: D/C home    Follow-up Appts:  Follow-up Information     Derinda Late, MD. Schedule an appointment as soon as possible for a visit in 1 week(s).   Specialty: Family Medicine Contact information: [address] [facility] [place] [address] [phone]         [facility] Follow up.   Why: Follow up with your doctor at [facility] within 7 days. Call the office to make an appointment if you do not already have one.                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  His constipation was corrected while he was in the ED and he was discharged from the ED to see his Urologist that same day.  Hours later he returned to the ED with complaints of fevers chills and suprapubic pain.  In the ED evaluation revealed evidence of bilateral pyelonephritis with possible early abscess formation at the cystectomy bed.  Transfer back to [facility] was  recommended but patient insisted on being admitted to [facility].  Hospital Course:  Sepsis POA due to Enterobacter cloacae acute pyelonephritis with bacteremia Blood pressure stabilized w/ volume expansion and pt was quickly able to be weaned off Levophed - clinically stable at time of d/c w/ no persisting sx of active infection - to continue extended course of abx w/ transition to oral dosing at time of d/c    Status post cystectomy with ileal conduit creation 8/22 with possible cystectomy bed abscess Care per Urology -plan for outpatient follow-up and consideration of repeat imaging at [facility] where surgery was performed    Newly diagnosed transient acute Atrial fib w/ RVR Initially persisted despite volume expansion and correction of sepsis - TSH normal- Mg normal - initiated Cardizem by mouth - discontinued beta-blocker - discussed use of anticoagulant with patient -CHA2DS2-VASc is at least 1 -on exam 9/4 patient was found to be back in NSR, and remained so to time of d/c -we will withhold anticoagulation for now and this will need to be followed up in the outpatient setting - discussed at length w/ pt and his wife    ?Grade III Diastolic CHF Reported on TTE -no evidence on clinical exam of significant CHF    Acute hypoxic respiratory failure Unclear etiology - did not appear to be a parenchymal issue - perhaps simply hypoventilation due to sepsis / lethargy? - resolved spontaneously with patient stable on room air   Normocytic anemia Likely due to acute illness and recent surgery -hemoglobin stable   Hyponatremia Likely simply due to volume depletion -  corrected w/ volume expansion    GERD Continue usual home medical therapy   NAFLD  Allergies as of 01/15/2021       Reactions   Tape Dermatitis   Skin blistered- steri strip   Wound Dressing Adhesive Dermatitis   Skin blistered- steri strip   Shellfish Allergy Diarrhea, Nausea And Vomiting   Shrimp   Shellfish-derived  Products Diarrhea, Nausea And Vomiting, Nausea Only   Onion Other (See Comments)   diarrhea Diarrhea- verified avoids all onion ingredient (including onion powder). MM, RD 12/19/20   Other Nausea And Vomiting   general anesthesia   Codeine Nausea Only        Medication List     STOP taking these medications    dextromethorphan-guaiFENesin 30-600 MG 12hr tablet Commonly known as: [place] DM       TAKE these medications    acetaminophen 500 MG tablet Commonly known as: TYLENOL Take 1,000 mg by mouth every 6 (six) hours as needed for pain.   aspirin EC 81 MG tablet Take 81 mg by mouth daily.   atorvastatin 10 MG tablet Commonly known as: LIPITOR Take 10 mg by mouth daily.   BLUE-EMU MAXIMUM STRENGTH EX Apply 1 application topically 4 (four) times daily as needed (joint pain).   ciprofloxacin 500 MG tablet Commonly known as: CIPRO Take 1 tablet (500 mg total) by mouth 2 (two) times daily.   fluticasone 50 MCG/ACT nasal spray Commonly known as: FLONASE Place 1 spray into the nose at bedtime.   ibuprofen 200 MG tablet Commonly known as: ADVIL Take 400 mg by mouth daily as needed for moderate pain.   multivitamin with minerals Tabs tablet Take 1 tablet by mouth daily. Iron Free   NETI POT SINUS [place] NA Place 1 Dose into the nose at bedtime as needed (congestion).   omeprazole 40 MG capsule Commonly known as: PRILOSEC Take 40 mg by mouth daily before breakfast.        Day of Discharge BP 132/80 (BP Location: Left Arm)   Pulse 71   Temp 98.2 F (36.8 C)   Resp 17   Ht 6' (1.829 m)   Wt 94 kg   SpO2 100%   BMI 28.11 kg/m   Physical Exam: General: No acute respiratory distress Lungs: Clear to auscultation bilaterally without wheezes or crackles Cardiovascular: Regular rate and rhythm without murmur gallop or rub normal S1 and S2 Abdomen: Nontender, nondistended, soft, bowel sounds positive, no rebound, no ascites, no appreciable mass Extremities: No  significant cyanosis, clubbing, or edema bilateral lower extremities  Basic Metabolic Panel: Recent Labs  Lab 01/11/21 1947 01/12/21 0415 01/13/21 0459 01/13/21 0606 01/14/21 0511  NA 133* 136 139 140 142  K 3.8 3.9 3.9 4.1 4.3  CL 104 106 113* 109 116*  CO2 20* 21* 21* 23 20*  GLUCOSE 111* 96 96 104* 122*  BUN '11 11 17 16 12  '$ CREATININE 0.85 0.89 0.71 0.77 0.81  CALCIUM 8.5* 8.1* 7.5* 7.9* 7.8*  MG  --   --  2.0 2.1  --     Liver Function Tests: Recent Labs  Lab 01/11/21 1947 01/12/21 0415 01/13/21 0459 01/13/21 0606 01/14/21 0511  AST 21 22 45* 40 32  ALT '14 12 19 21 19  '$ ALKPHOS 30* 25* 27* 24* 25*  BILITOT 1.0 0.7 0.4 0.3 0.3  PROT 5.9* 5.2* 5.0* 5.3* 4.9*  ALBUMIN 3.1* 2.6* 2.4* 2.5* 2.4*   Recent Labs  Lab 01/10/21 1946  LIPASE 37  Coags: Recent Labs  Lab 01/12/21 0415  INR 1.4*    CBC: Recent Labs  Lab 01/12/21 0415 01/12/21 0617 01/12/21 0736 01/12/21 1233 01/13/21 0630 01/14/21 0511  WBC 1.2* 3.8*  --  5.9 5.3 5.1  NEUTROABS 1.1* 3.7  --   --   --   --   HGB 7.9* 7.7* 7.9* 8.3* 8.6* 8.4*  HCT 23.6* 23.5* 25.0* 27.0* 27.0* 26.8*  MCV 88.4 89.0  --  92.8 91.2 90.8  PLT 177 162  --  169 155 161    Recent Results (from the past 240 hour(s))  Urine Culture     Status: Abnormal   Collection Time: 01/10/21  7:46 PM   Specimen: Urine, Random  Result Value Ref Range Status   Specimen Description   Final    URINE, RANDOM Performed at [facility], [address] [place], [place] [address]    Special Requests   Final    NONE Performed at [facility] [address], [address] [place], [place] [address]    Culture >=100,000 COLONIES/mL ENTEROBACTER CLOACAE (A)  Final   Report Status 01/13/2021 FINAL  Final   Organism ID, Bacteria ENTEROBACTER CLOACAE (A)  Final      Susceptibility   Enterobacter cloacae - MIC*    CEFAZOLIN >=64 RESISTANT Resistant     CEFEPIME <=0.12 SENSITIVE Sensitive     CIPROFLOXACIN <=0.25 SENSITIVE  Sensitive     GENTAMICIN <=1 SENSITIVE Sensitive     IMIPENEM 0.5 SENSITIVE Sensitive     NITROFURANTOIN <=16 SENSITIVE Sensitive     TRIMETH/SULFA <=20 SENSITIVE Sensitive     PIP/TAZO <=4 SENSITIVE Sensitive     * >=100,000 COLONIES/mL ENTEROBACTER CLOACAE  Resp Panel by RT-PCR (Flu A&B, Covid) Nasopharyngeal Swab     Status: None   Collection Time: 01/12/21 12:55 AM   Specimen: Nasopharyngeal Swab; Nasopharyngeal(NP) swabs in vial transport medium  Result Value Ref Range Status   SARS Coronavirus 2 by RT PCR NEGATIVE NEGATIVE Final    Comment: (NOTE) SARS-CoV-2 target nucleic acids are NOT DETECTED.  The SARS-CoV-2 RNA is generally detectable in upper respiratory specimens during the acute phase of infection. The lowest concentration of SARS-CoV-2 viral copies this assay can detect is 138 copies/mL. A negative result does not preclude SARS-Cov-2 infection and should not be used as the sole basis for treatment or other patient management decisions. A negative result may occur with  improper specimen collection/handling, submission of specimen other than nasopharyngeal swab, presence of viral mutation(s) within the areas targeted by this assay, and inadequate number of viral copies(<138 copies/mL). A negative result must be combined with clinical observations, patient history, and epidemiological information. The expected result is Negative.  Fact Sheet for Patients:  EntrepreneurPulse.com.au  Fact Sheet for Healthcare Providers:  IncredibleEmployment.be  This test is no t yet approved or cleared by the [place] FDA and  has been authorized for detection and/or diagnosis of SARS-CoV-2 by FDA under an Emergency Use Authorization (EUA). This EUA will remain  in effect (meaning this test can be used) for the duration of the COVID-19 declaration under Section 564(b)(1) of the Act, 21 U.S.C.section 360bbb-3(b)(1), unless the authorization is  terminated  or revoked sooner.       Influenza A by PCR NEGATIVE NEGATIVE Final   Influenza B by PCR NEGATIVE NEGATIVE Final    Comment: (NOTE) The Xpert Xpress SARS-CoV-2/FLU/RSV plus assay is intended as an aid in the diagnosis of influenza from Nasopharyngeal swab specimens and  should not be used as a sole basis for treatment. Nasal washings and aspirates are unacceptable for Xpert Xpress SARS-CoV-2/FLU/RSV testing.  Fact Sheet for Patients: EntrepreneurPulse.com.au  Fact Sheet for Healthcare Providers: IncredibleEmployment.be  This test is not yet approved or cleared by the [place] FDA and has been authorized for detection and/or diagnosis of SARS-CoV-2 by FDA under an Emergency Use Authorization (EUA). This EUA will remain in effect (meaning this test can be used) for the duration of the COVID-19 declaration under Section 564(b)(1) of the Act, 21 U.S.C. section 360bbb-3(b)(1), unless the authorization is terminated or revoked.  Performed at [facility], [address] [place], [place] [address]   Blood Culture (routine x 2)     Status: Abnormal   Collection Time: 01/12/21  2:05 AM   Specimen: BLOOD  Result Value Ref Range Status   Specimen Description   Final    BLOOD RIGHT ASSIST CONTROL Performed at [facility], [address] [place], [place] [address]    Special Requests   Final    BOTTLES DRAWN AEROBIC AND ANAEROBIC Blood Culture adequate volume Performed at [facility], [address], [place], [place] [address]    Culture  Setup Time   Final    GRAM NEGATIVE RODS IN BOTH AEROBIC AND ANAEROBIC BOTTLES Organism ID to follow CRITICAL RESULT CALLED TO, READ BACK BY AND VERIFIED WITH: [facility] MITCHELL 01/12/21 1115 JGF    Culture ENTEROBACTER CLOACAE (A)  Final   Report Status 01/14/2021 FINAL  Final   Organism ID, Bacteria ENTEROBACTER CLOACAE  Final      Susceptibility   Enterobacter  cloacae - MIC*    CEFAZOLIN >=64 RESISTANT Resistant     CEFEPIME <=0.12 SENSITIVE Sensitive     CEFTAZIDIME <=1 SENSITIVE Sensitive     CIPROFLOXACIN <=0.25 SENSITIVE Sensitive     GENTAMICIN <=1 SENSITIVE Sensitive     IMIPENEM 0.5 SENSITIVE Sensitive     TRIMETH/SULFA <=20 SENSITIVE Sensitive     PIP/TAZO <=4 SENSITIVE Sensitive     * ENTEROBACTER CLOACAE  Blood Culture ID Panel (Reflexed)     Status: Abnormal   Collection Time: 01/12/21  2:05 AM  Result Value Ref Range Status   Enterococcus faecalis NOT DETECTED NOT DETECTED Final   Enterococcus Faecium NOT DETECTED NOT DETECTED Final   Listeria monocytogenes NOT DETECTED NOT DETECTED Final   Staphylococcus species NOT DETECTED NOT DETECTED Final   Staphylococcus aureus (BCID) NOT DETECTED NOT DETECTED Final   Staphylococcus epidermidis NOT DETECTED NOT DETECTED Final   Staphylococcus lugdunensis NOT DETECTED NOT DETECTED Final   Streptococcus species NOT DETECTED NOT DETECTED Final   Streptococcus agalactiae NOT DETECTED NOT DETECTED Final   Streptococcus pneumoniae NOT DETECTED NOT DETECTED Final   Streptococcus pyogenes NOT DETECTED NOT DETECTED Final   A.calcoaceticus-baumannii NOT DETECTED NOT DETECTED Final   Bacteroides fragilis NOT DETECTED NOT DETECTED Final   Enterobacterales DETECTED (A) NOT DETECTED Final    Comment: Enterobacterales represent a large order of gram negative bacteria, not a single organism.   Enterobacter cloacae complex DETECTED (A) NOT DETECTED Final    Comment: RESULT CALLED TO, READ BACK BY AND VERIFIED WITH: DEVIN MITCHELL 01/12/21 @ 1311 BY SB    Escherichia coli NOT DETECTED NOT DETECTED Final   Klebsiella aerogenes NOT DETECTED NOT DETECTED Final   Klebsiella oxytoca NOT DETECTED NOT DETECTED Final   Klebsiella pneumoniae NOT DETECTED NOT DETECTED Final   Proteus species NOT DETECTED NOT DETECTED Final  Salmonella species NOT DETECTED NOT DETECTED Final   Serratia marcescens NOT DETECTED  NOT DETECTED Final   Haemophilus influenzae NOT DETECTED NOT DETECTED Final   Neisseria meningitidis NOT DETECTED NOT DETECTED Final   Pseudomonas aeruginosa NOT DETECTED NOT DETECTED Final   Stenotrophomonas maltophilia NOT DETECTED NOT DETECTED Final   Candida albicans NOT DETECTED NOT DETECTED Final   Candida auris NOT DETECTED NOT DETECTED Final   Candida glabrata NOT DETECTED NOT DETECTED Final   Candida krusei NOT DETECTED NOT DETECTED Final   Candida parapsilosis NOT DETECTED NOT DETECTED Final   Candida tropicalis NOT DETECTED NOT DETECTED Final   Cryptococcus neoformans/gattii NOT DETECTED NOT DETECTED Final   CTX-M ESBL NOT DETECTED NOT DETECTED Final   Carbapenem resistance IMP NOT DETECTED NOT DETECTED Final   Carbapenem resistance KPC NOT DETECTED NOT DETECTED Final   Carbapenem resistance NDM NOT DETECTED NOT DETECTED Final   Carbapenem resist OXA 48 LIKE NOT DETECTED NOT DETECTED Final   Carbapenem resistance VIM NOT DETECTED NOT DETECTED Final    Comment: Performed at [facility], [address] [place], [place] [address]  Blood Culture (routine x 2)     Status: Abnormal   Collection Time: 01/12/21  2:20 AM   Specimen: BLOOD  Result Value Ref Range Status   Specimen Description   Final    BLOOD LEFT ASSIST CONTROL Performed at [facility], [address] [place], [place] [address]    Special Requests   Final    BOTTLES DRAWN AEROBIC AND ANAEROBIC Blood Culture results may not be optimal due to an excessive volume of blood received in culture bottles Performed at [facility], [address] [place], [place] [address]    Culture  Setup Time   Final    GRAM NEGATIVE RODS IN BOTH AEROBIC AND ANAEROBIC BOTTLES CRITICAL VALUE NOTED.  VALUE IS CONSISTENT WITH PREVIOUSLY REPORTED AND CALLED VALUE.    Culture (A)  Final    ENTEROBACTER CLOACAE SUSCEPTIBILITIES PERFORMED ON PREVIOUS CULTURE WITHIN THE LAST 5 DAYS. Performed at [facility], [address] [place], [place] [address]    Report Status 01/15/2021 FINAL  Final  Urine Culture     Status: Abnormal   Collection Time: 01/12/21  4:15 AM   Specimen: Urine, Random  Result Value Ref Range Status   Specimen Description   Final    URINE, RANDOM Performed at [facility], [address], [place], [place] [address]    Special Requests   Final    NONE Performed at [facility], [address] [place], [place] [address]    Culture MULTIPLE SPECIES PRESENT, SUGGEST RECOLLECTION (A)  Final   Report Status 01/13/2021 FINAL  Final  MRSA Next Gen by PCR, Nasal     Status: None   Collection Time: 01/12/21  4:15 AM   Specimen: Nasal Mucosa; Nasal Swab  Result Value Ref Range Status   MRSA by PCR Next Gen NOT DETECTED NOT DETECTED Final    Comment: (NOTE) The GeneXpert MRSA Assay (FDA approved for NASAL specimens only), is one component of a comprehensive MRSA colonization surveillance program. It is not intended to diagnose MRSA infection nor to guide or monitor treatment for MRSA infections. Test performance is not FDA approved in patients less than 33 years old. Performed at [facility], [address] [place], [place] [address]       Time spent in discharge (includes decision making & examination of  pt): 35 minutes  01/15/2021, 12:29 PM   Cherene Altes, MD [facility] Office  [phone]

## 2021-02-19 LAB — BLOOD GAS, ARTERIAL
Acid-base deficit: 3.2 mmol/L — ABNORMAL HIGH (ref 0.0–2.0)
Bicarbonate: 20.4 mmol/L (ref 20.0–28.0)
FIO2: 60
O2 Saturation: 99.3 %
Patient temperature: 37
pCO2 arterial: 30 mmHg — ABNORMAL LOW (ref 32.0–48.0)
pH, Arterial: 7.44 (ref 7.350–7.450)
pO2, Arterial: 144 mmHg — ABNORMAL HIGH (ref 83.0–108.0)

## 2021-02-19 LAB — BLOOD GAS, VENOUS
Acid-base deficit: 8.7 mmol/L — ABNORMAL HIGH (ref 0.0–2.0)
Bicarbonate: 18.4 mmol/L — ABNORMAL LOW (ref 20.0–28.0)
Patient temperature: 37
pCO2, Ven: 44 mmHg (ref 44.0–60.0)
pH, Ven: 7.23 — ABNORMAL LOW (ref 7.250–7.430)
pO2, Ven: <31 mmHg — CL (ref 32.0–45.0)

## 2021-04-26 ENCOUNTER — Other Ambulatory Visit: Payer: Self-pay

## 2021-04-26 ENCOUNTER — Ambulatory Visit (INDEPENDENT_AMBULATORY_CARE_PROVIDER_SITE_OTHER): Payer: Medicare HMO | Admitting: Dermatology

## 2021-04-26 ENCOUNTER — Encounter: Payer: Self-pay | Admitting: Dermatology

## 2021-04-26 DIAGNOSIS — Z872 Personal history of diseases of the skin and subcutaneous tissue: Secondary | ICD-10-CM

## 2021-04-26 DIAGNOSIS — L578 Other skin changes due to chronic exposure to nonionizing radiation: Secondary | ICD-10-CM

## 2021-04-26 DIAGNOSIS — L680 Hirsutism: Secondary | ICD-10-CM

## 2021-04-26 DIAGNOSIS — Z85828 Personal history of other malignant neoplasm of skin: Secondary | ICD-10-CM

## 2021-04-26 DIAGNOSIS — L821 Other seborrheic keratosis: Secondary | ICD-10-CM

## 2021-04-26 DIAGNOSIS — D1801 Hemangioma of skin and subcutaneous tissue: Secondary | ICD-10-CM

## 2021-04-26 DIAGNOSIS — L570 Actinic keratosis: Secondary | ICD-10-CM | POA: Diagnosis not present

## 2021-04-26 DIAGNOSIS — Z1283 Encounter for screening for malignant neoplasm of skin: Secondary | ICD-10-CM

## 2021-04-26 DIAGNOSIS — D229 Melanocytic nevi, unspecified: Secondary | ICD-10-CM

## 2021-04-26 DIAGNOSIS — D485 Neoplasm of uncertain behavior of skin: Secondary | ICD-10-CM

## 2021-04-26 DIAGNOSIS — L814 Other melanin hyperpigmentation: Secondary | ICD-10-CM

## 2021-04-26 NOTE — Patient Instructions (Addendum)

## 2021-04-26 NOTE — Progress Notes (Signed)
Follow-Up Visit   Subjective  Donald Barry is a 75 y.o. male who presents for the following: Annual Exam (Hx of SCC and AK's - patient did use the skin medicinals mix from the temples down to his neck and had a vigorous reaction around his neck. He states skin around the neck sloughed off. ). Since his last visit patient now has an ostomy bag and he would like to discuss laser hair removal around area since it clogs his razor up and causes other skin issues.   The following portions of the chart were reviewed this encounter and updated as appropriate:   Tobacco   Allergies   Meds   Problems   Med Hx   Surg Hx   Fam Hx       Review of Systems:  No other skin or systemic complaints except as noted in HPI or Assessment and Plan.  Objective  Well appearing patient in no apparent distress; mood and affect are within normal limits.  A full examination was performed including scalp, head, eyes, ears, nose, lips, neck, chest, axillae, abdomen, back, buttocks, bilateral upper extremities, bilateral lower extremities, hands, feet, fingers, toes, fingernails, and toenails. All findings within normal limits unless otherwise noted below.  L forehead above brow x 1, L zygoma x 1, L post auricular x 1, vertex scalp x 1 (4) Erythematous thin papules/macules with gritty scale.   abdomen Hair on the trunk.  right base of neck Excoriation.   Assessment & Plan  AK (actinic keratosis) (4) L forehead above brow x 1, L zygoma x 1, L post auricular x 1, vertex scalp x 1  Prior to procedure, discussed risks of blister formation, small wound, skin dyspigmentation, or rare scar following cryotherapy. Recommend Vaseline ointment to treated areas while healing.    Destruction of lesion - L forehead above brow x 1, L zygoma x 1, L post auricular x 1, vertex scalp x 1 Complexity: simple   Destruction method: cryotherapy   Informed consent: discussed and consent obtained   Timeout:  patient name, date of  birth, surgical site, and procedure verified Lesion destroyed using liquid nitrogen: Yes   Region frozen until ice ball extended beyond lesion: Yes   Outcome: patient tolerated procedure well with no complications   Post-procedure details: wound care instructions given    Hairiness abdomen  Patient would like to discuss laser hair removal around ostomy bag since it clogs up his razor and causes other skin issues. Will research different options and contact patient with those results.   Neoplasm of uncertain behavior of skin right base of neck  Favor ingrown hair that has been scratched but cannot rule out risk of other neoplasm such as small BCC.  Patient to watch for changes and RTC if not completely resolved in a few weeks.   Lentigines - Scattered tan macules - Due to sun exposure - Benign-appearing, observe - Recommend daily broad spectrum sunscreen SPF 30+ to sun-exposed areas, reapply every 2 hours as needed. - Call for any changes  Seborrheic Keratoses - Stuck-on, waxy, tan-brown papules and/or plaques  - Benign-appearing - Discussed benign etiology and prognosis. - Observe - Call for any changes  Melanocytic Nevi - Tan-brown and/or pink-flesh-colored symmetric macules and papules - Benign appearing on exam today - Observation - Call clinic for new or changing moles - Recommend daily use of broad spectrum spf 30+ sunscreen to sun-exposed areas.   Hemangiomas - Red papules - Discussed benign nature -  Observe - Call for any changes  Actinic Damage - chronic, secondary to cumulative UV radiation exposure/sun exposure over time - diffuse scaly erythematous macules with underlying dyspigmentation - Recommend daily broad spectrum sunscreen SPF 30+ to sun-exposed areas, reapply every 2 hours as needed.  - Recommend staying in the shade or wearing long sleeves, sun glasses (UVA+UVB protection) and wide brim hats (4-inch brim around the entire circumference of the  hat). - Call for new or changing lesions.   History of Squamous Cell Carcinoma of the Skin - No evidence of recurrence today - No lymphadenopathy - Recommend regular full body skin exams - Recommend daily broad spectrum sunscreen SPF 30+ to sun-exposed areas, reapply every 2 hours as needed.  - Call if any new or changing lesions are noted between office visits  Skin cancer screening performed today.  Return for TBSE in 6-12 mths.  Donald Barry, CMA, am acting as scribe for [name], MD .  Documentation: I have reviewed the above documentation for accuracy and completeness, and I agree with the above.  [name], MD

## 2021-05-16 ENCOUNTER — Telehealth: Payer: Self-pay | Admitting: Dermatology

## 2021-05-16 ENCOUNTER — Other Ambulatory Visit: Payer: Self-pay | Admitting: *Deleted

## 2021-05-16 DIAGNOSIS — D472 Monoclonal gammopathy: Secondary | ICD-10-CM

## 2021-05-16 NOTE — Telephone Encounter (Signed)
Can you please call Dr. Elzie Rings office and see if he would have any concern about Donald Barry taking niacinamide 500 mg twice a day for skin cancer prevention? Thank you!

## 2021-05-17 ENCOUNTER — Telehealth: Payer: Self-pay

## 2021-05-17 NOTE — Telephone Encounter (Signed)
Dr. Abbe Amsterdam nurse returned call and [facility] patient starting medication.

## 2021-05-17 NOTE — Telephone Encounter (Signed)
Spoke with Dr. Elzie Rings office and they will send him a message asking if he is ok with patient taking niacinamide 500 mg twice daily for skin cancer prevention and let us know./js

## 2021-05-18 NOTE — Progress Notes (Signed)
[facility] [place], [place] [address]  Pulmonary Sleep Medicine   Office Visit Note  Patient Name: Donald Barry DOB: [birth year] MRN [id number]    Chief Complaint: Obstructive Sleep Apnea visit  Brief History:  Donald Barry is seen today for initial consult to establish care.   He has a history of snoring, hypertensionand GE reflux.  The patient has a 4.5 - 5  year history of sleep apnea. Patient is not using PAP stating after he had surgery & received an ostomy bag.  Patient said it now feels literally like he is mothering, something over his face.  Current setting are 5 - 20cmH2O with EPR @ 2, fulltime. PAP nightly. Bedtime usually 10 pm and wake up is between 6 - 8 am.  The patient felt good when PAP was tolerable and working earlier in the year.Reported sleepiness is  resolved when in use. and the Epworth Sleepiness Score is 5 out of 24. The patient does not take naps. The patient complains of the following: pressure feels like he is smothering.  The compliance download shows  compliance with an average use time of 5:35 hours @ 70% . The AHI is 2.9  The patient does complain of limb movements disrupting sleep.  ROS  General: (-) fever, (-) chills, (-) night sweat Nose and Sinuses: (-) nasal stuffiness or itchiness, (-) postnasal drip, (-) nosebleeds, (-) sinus trouble. Mouth and Throat: (-) sore throat, (-) hoarseness. Neck: (-) swollen glands, (-) enlarged thyroid, (-) neck pain. Respiratory: + cough, - shortness of breath, - wheezing. Neurologic: - numbness, - tingling. Psychiatric: - anxiety, - depression   Current Medication: Outpatient Encounter Medications as of 05/21/2021  Medication Sig   omeprazole (PRILOSEC) 40 MG capsule Take by mouth.   aspirin EC 81 MG tablet Take 81 mg by mouth daily.    atorvastatin (LIPITOR) 10 MG tablet Take 10 mg by mouth daily.   chlorpheniramine-HYDROcodone (TUSSIONEX) 10-8 MG/5ML SUER SMARTSIG:5 Milliliter(s) By  Mouth Every 12 Hours PRN   Multiple Vitamin (MULTIVITAMIN WITH MINERALS) TABS tablet Take 1 tablet by mouth daily. Iron Free   predniSONE (DELTASONE) 10 MG tablet Take 10 mg by mouth daily.   [DISCONTINUED] acetaminophen (TYLENOL) 500 MG tablet Take 1,000 mg by mouth every 6 (six) hours as needed for pain.   [DISCONTINUED] ciprofloxacin (CIPRO) 500 MG tablet Take 1 tablet (500 mg total) by mouth 2 (two) times daily.   [DISCONTINUED] dextromethorphan-guaiFENesin (MUCINEX DM) 30-600 MG 12hr tablet Take 1 tablet by mouth at bedtime.   No facility-administered encounter medications on file as of 05/21/2021.    Surgical History: Past Surgical History:  Procedure Laterality Date   BACK SURGERY  2008   L4, L5   COLONOSCOPY     cystectomy with prostatectomy ileal conduit     CYSTO WITH HYDRODISTENSION N/A 06/17/2019   Procedure: CYSTOSCOPY/HYDRODISTENSION;  Surgeon: Billey Co, MD;  Location: [facility];  Service: Urology;  Laterality: N/A;   CYSTOSCOPY WITH BIOPSY N/A 07/17/2018   Procedure: CYSTOSCOPY WITH BLADDER BIOPSY;  Surgeon: Billey Co, MD;  Location: [facility];  Service: Urology;  Laterality: N/A;   CYSTOSCOPY WITH BIOPSY N/A 06/17/2019   Procedure: CYSTOSCOPY WITH BLADDER  BIOPSY;  Surgeon: Billey Co, MD;  Location: [facility];  Service: Urology;  Laterality: N/A;   CYSTOSCOPY WITH FULGERATION N/A 07/17/2018   Procedure: CYSTOSCOPY WITH FULGERATION;  Surgeon: Billey Co, MD;  Location: [facility];  Service: Urology;  Laterality: N/A;  CYSTOSCOPY WITH FULGERATION N/A 06/17/2019   Procedure: CYSTOSCOPY WITH FULGERATION;  Surgeon: Billey Co, MD;  Location: [facility];  Service: Urology;  Laterality: N/A;   ESOPHAGOGASTRODUODENOSCOPY     HERNIA REPAIR  [id number]   umbilical   STOMACH SURGERY     FUNDIC GLAND POLYP    Medical History: Past Medical History:  Diagnosis Date   Actinic keratosis    Hx of PDT Tx., face 1/252021 and scalp 07/05/2019   Allergic rhinitis,  seasonal 06/14/2014   Allergy    Arthritis    Chronic neck pain 11/17/2421   Complication of anesthesia    Gastroesophageal reflux disease without esophagitis 06/14/2014   Headache    h/o migraines   Hepatic steatosis 04/05/2018   Hiatal hernia 09/09/2017   Intermittent left lower quadrant abdominal pain 07/15/2017   PONV (postoperative nausea and vomiting)    Sleep apnea    cpap   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 SCCis   Squamous cell skin cancer 07/2017   Resected from scalp.    Family History: Non contributory to the present illness  Social History: Social History   Socioeconomic History   Marital status: Married    Spouse name: Not on file   Number of children: Not on file   Years of education: Not on file   Highest education level: Not on file  Occupational History   Not on file  Tobacco Use   Smoking status: Never   Smokeless tobacco: Never  Vaping Use   Vaping Use: Never used  Substance and Sexual Activity   Alcohol use: Never   Drug use: Never   Sexual activity: Yes    Birth control/protection: None  Other Topics Concern   Not on file  Social History Narrative   Not on file   Social Determinants of Health   Financial Resource Strain: Not on file  Food Insecurity: Not on file  Transportation Needs: Not on file  Physical Activity: Not on file  Stress: Not on file  Social Connections: Not on file  Intimate Partner Violence: Not on file    Vital Signs: Blood pressure 112/79, pulse 92, resp. rate 18, height 6' (1.829 m), weight 199 lb (90.3 kg), SpO2 93 %. Body mass index is 26.99 kg/m.    Examination: General Appearance: The patient is well-developed, well-nourished, and in no distress. Neck Circumference: 44 cm Skin: Gross inspection of skin unremarkable. Head: normocephalic, no gross deformities. Eyes: no gross deformities noted. ENT: ears  appear grossly normal Neurologic: Alert and oriented. No involuntary movements.    EPWORTH SLEEPINESS SCALE:  Scale:  (0)= no chance of dozing; (1)= slight chance of dozing; (2)= moderate chance of dozing; (3)= high chance of dozing  Chance  Situtation    Sitting and reading: 2    Watching TV: 1    Sitting Inactive in public: 0    As a passenger in car: 2      Lying down to rest: 0    Sitting and talking: 0    Sitting quielty after lunch: 0    In a car, stopped in traffic: 0   TOTAL SCORE:   5 out of 24    SLEEP STUDIES:  HST 12/24/17 - overall AHI 22,  overall RDI 44, min SpO2 82%   CPAP COMPLIANCE DATA:  Date Range: 05/22/20 - 05/21/21  Average Daily Use: 5:38  hours  Median Use: 7:49 hours  Compliance for > 70%  AHI: 2.9 respiratory events per hour  Days Used: 287/365  Mask Leak: 8.9 lpm  95th Percentile Pressure: 8.1 cmH2O        LABS: No results found for this or any previous visit (from the past 2160 hour(s)).  Radiology: CT ABDOMEN PELVIS W CONTRAST  Result Date: 01/12/2021 CLINICAL DATA:  Nausea vomiting and abdominal pain. EXAM: CT ABDOMEN AND PELVIS WITH CONTRAST TECHNIQUE: Multidetector CT imaging of the abdomen and pelvis was performed using the standard protocol following bolus administration of intravenous contrast. CONTRAST:  83mL OMNIPAQUE IOHEXOL 350 MG/ML SOLN COMPARISON:  Abdominal ultrasound dated 04/05/2020 and CT dated 07/07/2018. FINDINGS: Lower chest: Bibasilar atelectasis/scarring. No intra-abdominal free air.  No free fluid. Hepatobiliary: Mild fatty liver. No intrahepatic biliary ductal dilatation. There is a 14 mm stone in the region of the gallbladder neck. No pericholecystic fluid or evidence of acute cholecystitis by CT. Pancreas: Unremarkable. No pancreatic ductal dilatation or surrounding inflammatory changes. Spleen: Normal in size without focal abnormality. Adrenals/Urinary Tract: The adrenal glands are unremarkable.  There is mild bilateral hydronephrosis. There is haziness of the perinephric and periureteric fat with urothelial enhancement of the renal collecting system and ureters most consistent with pyelonephritis. Correlation with urinalysis recommended. Subcentimeter partially exophytic left renal upper pole hypodense lesion is too small to characterize, likely a cyst. 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 Postsurgical changes of cystectomy. There is a complex loculated collection containing fluid and air at the cystectomy bed measuring approximately 8 x 3 x 2 cm. There is enhancement of the walls of this collection. Findings most consistent with developing abscess. Stomach/Bowel: Postsurgical changes of the bowel with a right lower quadrant ileal conduit. There is sigmoid diverticulosis without active inflammatory changes. No bowel obstruction. The appendix is normal. Vascular/Lymphatic: Mild aortoiliac atherosclerotic disease. The IVC is unremarkable. No portal venous gas. There is no adenopathy. Reproductive: The prostate gland is poorly visualized. The seminal vesicles are symmetric. Other: Midline vertical anterior abdominal wall incisional scar. Musculoskeletal: Degenerative changes of the spine. No acute osseous pathology. IMPRESSION: 1. 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 There is a complex loculated collection containing fluid and air at the cystectomy bed most consistent with developing abscess. 2. Mild bilateral hydronephrosis with findings of pyelonephritis. Correlation with urinalysis recommended. 3. Sigmoid diverticulosis. No bowel obstruction. Normal appendix. 4. Cholelithiasis. 5. Aortic Atherosclerosis (ICD10-I70.0). Electronically Signed   By: Anner Crete M.D.   On: 01/12/2021 01:37   DG Chest Port 1 View  Result Date: 01/12/2021 CLINICAL DATA:  Lower abdominal and back pain. EXAM: PORTABLE CHEST 1 VIEW COMPARISON:  CT abdomen pelvis dated 01/12/2021. FINDINGS:  No focal consolidation, pleural effusion, or pneumothorax. The cardiac silhouette is within limits. No acute osseous pathology. IMPRESSION: No active disease. Electronically Signed   By: Anner Crete M.D.   On: 01/12/2021 04:02   ECHOCARDIOGRAM COMPLETE  Result Date: 01/12/2021    ECHOCARDIOGRAM REPORT   Patient Name:   DEZMON CONOVER Wayson Date of Exam: 01/12/2021 Medical Rec #:  [id number]        Height:       72.0 in Accession #:    [id number]       Weight:       200.0 lb Date of Birth:  Nov 11, 1945        BSA:          2.131 m Patient Age:    11 years  BP:           105/67 mmHg Patient Gender: M                HR:           62 bpm. Exam Location:  [facility] Procedure: 2D Echo, Color Doppler and Cardiac Doppler Indications:     R06.00 Dyspnea  History:         Patient has no prior history of Echocardiogram examinations.                  Risk Factors:Sleep Apnea.  Sonographer:     Charmayne Sheer Referring Phys:  [id number] SARA-MAIZ A THOMAS Diagnosing Phys: Neoma Laming  Sonographer Comments: Image acquisition challenging due to respiratory motion. IMPRESSIONS  1. Left ventricular ejection fraction, by estimation, is 50 to 55%. The left ventricle has low normal function. The left ventricle demonstrates global hypokinesis. Left ventricular diastolic parameters are consistent with Grade III diastolic dysfunction  (restrictive).  2. Right ventricular systolic function is normal. The right ventricular size is normal.  3. 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 No evidence of mitral stenosis.  4. The aortic valve is normal in structure. Aortic valve regurgitation is mild. Mild aortic valve sclerosis is present, with no evidence of aortic valve stenosis.  5. The inferior vena cava is normal in size with greater than 50% respiratory variability, suggesting right atrial pressure of 3 mmHg. FINDINGS  Left Ventricle: Left ventricular ejection fraction, by estimation, is 50 to 55%. The left ventricle  has low normal function. The left ventricle demonstrates global hypokinesis. The left ventricular internal cavity size was normal in size. There is borderline concentric left ventricular hypertrophy. Left ventricular diastolic parameters are consistent with Grade III diastolic dysfunction (restrictive). Right Ventricle: The right ventricular size is normal. No increase in right ventricular wall thickness. Right ventricular systolic function is normal. Left Atrium: Left atrial size was normal in size. Right Atrium: Right atrial size was normal in size. Pericardium: There is no evidence of pericardial effusion. 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 No evidence of mitral valve stenosis. MV peak gradient, 6.4 mmHg. The mean mitral valve gradient is 2.0 mmHg. Tricuspid Valve: The tricuspid valve is normal in structure. Tricuspid valve regurgitation is not demonstrated. No evidence of tricuspid stenosis. Aortic Valve: The aortic valve is normal in structure. Aortic valve regurgitation is mild. Mild aortic valve sclerosis is present, with no evidence of aortic valve stenosis. Aortic valve mean gradient measures 4.0 mmHg. Aortic valve peak gradient measures 8.2 mmHg. Aortic valve area, by VTI measures 3.53 cm. Pulmonic Valve: The pulmonic valve was normal in structure. Pulmonic valve regurgitation is not visualized. No evidence of pulmonic stenosis. Aorta: The aortic root is normal in size and structure. Venous: The inferior vena cava is normal in size with greater than 50% respiratory variability, suggesting right atrial pressure of 3 mmHg. IAS/Shunts: No atrial level shunt detected by color flow Doppler.  LEFT VENTRICLE PLAX 2D LVIDd:         4.44 cm  Diastology LVIDs:         3.31 cm  LV e' medial:    8.49 cm/s LV PW:         0.82 cm  LV E/e' medial:  13.3 LV IVS:        0.80 cm  LV e' lateral:   8.05 cm/s LVOT diam:     2.60 cm  LV E/e'  lateral: 14.0 LV SV:         97 LV SV  Index:   45 LVOT Area:     5.31 cm  LEFT ATRIUM             Index LA diam:        3.70 cm 1.74 cm/m LA Vol (A2C):   46.7 ml 21.92 ml/m LA Vol (A4C):   51.7 ml 24.27 ml/m LA Biplane Vol: 52.5 ml 24.64 ml/m  AORTIC VALVE                   PULMONIC VALVE AV Area (Vmax):    3.37 cm    PV Vmax:       0.62 m/s AV Area (Vmean):   3.35 cm    PV Vmean:      46.400 cm/s AV Area (VTI):     3.53 cm    PV VTI:        0.130 m AV Vmax:           143.00 cm/s PV Peak grad:  1.5 mmHg AV Vmean:          99.100 cm/s PV Mean grad:  1.0 mmHg AV VTI:            0.274 m AV Peak Grad:      8.2 mmHg AV Mean Grad:      4.0 mmHg LVOT Vmax:         90.70 cm/s LVOT Vmean:        62.600 cm/s LVOT VTI:          0.182 m LVOT/AV VTI ratio: 0.66  AORTA Ao Root diam: 3.90 cm MITRAL VALVE MV Area (PHT): 2.78 cm     SHUNTS MV Area VTI:   2.75 cm     Systemic VTI:  0.18 m MV Peak grad:  6.4 mmHg     Systemic Diam: 2.60 cm MV Mean grad:  2.0 mmHg MV Vmax:       1.26 m/s MV Vmean:      70.7 cm/s MV Decel Time: 273 msec MV E velocity: 113.00 cm/s MV A velocity: 78.40 cm/s MV E/A ratio:  1.44 Shaukat Deadra Diggins Electronically signed by Neoma Laming Signature Date/Time: 01/12/2021/3:19:44 PM    Final     No results found.  No results found.    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 OSA on CPAP The patient is not  tolerating PAP and reports no recent benefit from PAP use. He is having the sensation of feeling "smothered" since September 2022 and is not feeling able to use his APAP. He needs cpap/bipap titration in light of same. The patient was reminded how to clean equipment and advised to replace supplies routinely. AHI is  2.9 looking at the last year, but he has had more events in the last few months when he has attempted to use the cpap.   OSA- cpap/bipap titration recommended. F/u after se  2. CPAP use counseling CPAP Counseling: had a lengthy discussion with the patient regarding the importance of PAP therapy in management of the sleep apnea. Patient appears to understand the risk factor reduction and also understands the risks associated with untreated sleep apnea. Patient will try to make a good faith effort to remain compliant with therapy. Also instructed the patient on proper cleaning of the device including the water must be changed daily if possible and use of distilled water is preferred. Patient understands that the machine should be regularly cleaned with appropriate recommended cleaning solutions that do not damage the PAP machine for example given white vinegar and water rinses. Other methods such as ozone treatment may not be as good as these simple methods to achieve cleaning.   ngs of the evaluation and examination with Elberta Fortis.  I have also discussed any further diagnostic evaluation thatmay be needed or ordered today. Donald Barry verbalizes understanding of the findings of todays visit. We also reviewed his medications today and discussed drug interactions and side effects including but not limited excessive drowsiness and altered mental states. We also discussed that there is always a risk not just to him but also people around him.  he has been encouraged to call the office with any questions or concerns that should arise related to todays visit.  No orders of the defined types were placed in this encounter.       I have personally obtained a history, examined the patient, evaluated laboratory and imaging results, formulated the assessment and plan and placed orders. This patient was seen today by Tressie Ellis, PA-C in collaboration with Dr. Devona Konig.   Allyne Gee, MD [facility] Diplomate ABMS Pulmonary Critical Care Medicine and Sleep Medicine

## 2021-05-21 ENCOUNTER — Ambulatory Visit (INDEPENDENT_AMBULATORY_CARE_PROVIDER_SITE_OTHER): Payer: Medicare HMO | Admitting: Internal Medicine

## 2021-05-21 VITALS — BP 112/79 | HR 92 | Resp 18 | Ht 72.0 in | Wt 199.0 lb

## 2021-05-21 DIAGNOSIS — Z7189 Other specified counseling: Secondary | ICD-10-CM

## 2021-05-21 DIAGNOSIS — D472 Monoclonal gammopathy: Secondary | ICD-10-CM | POA: Insufficient documentation

## 2021-05-21 DIAGNOSIS — G4733 Obstructive sleep apnea (adult) (pediatric): Secondary | ICD-10-CM | POA: Insufficient documentation

## 2021-05-21 DIAGNOSIS — Z9989 Dependence on other enabling machines and devices: Secondary | ICD-10-CM

## 2021-05-21 NOTE — Patient Instructions (Signed)

## 2021-05-22 ENCOUNTER — Other Ambulatory Visit: Payer: Self-pay

## 2021-05-22 ENCOUNTER — Inpatient Hospital Stay: Payer: Medicare HMO | Attending: Oncology

## 2021-05-22 DIAGNOSIS — D509 Iron deficiency anemia, unspecified: Secondary | ICD-10-CM | POA: Insufficient documentation

## 2021-05-22 DIAGNOSIS — D472 Monoclonal gammopathy: Secondary | ICD-10-CM | POA: Diagnosis not present

## 2021-05-22 LAB — CBC WITH DIFFERENTIAL/PLATELET
Abs Immature Granulocytes: 0.02 10*3/uL (ref 0.00–0.07)
Basophils Absolute: 0 10*3/uL (ref 0.0–0.1)
Basophils Relative: 0 %
Eosinophils Absolute: 0.1 10*3/uL (ref 0.0–0.5)
Eosinophils Relative: 1 %
HCT: 35.2 % — ABNORMAL LOW (ref 39.0–52.0)
Hemoglobin: 10.7 g/dL — ABNORMAL LOW (ref 13.0–17.0)
Immature Granulocytes: 0 %
Lymphocytes Relative: 17 %
Lymphs Abs: 1.1 10*3/uL (ref 0.7–4.0)
MCH: 22.1 pg — ABNORMAL LOW (ref 26.0–34.0)
MCHC: 30.4 g/dL (ref 30.0–36.0)
MCV: 72.6 fL — ABNORMAL LOW (ref 80.0–100.0)
Monocytes Absolute: 0.6 10*3/uL (ref 0.1–1.0)
Monocytes Relative: 10 %
Neutro Abs: 4.3 10*3/uL (ref 1.7–7.7)
Neutrophils Relative %: 72 %
Platelets: 211 10*3/uL (ref 150–400)
RBC: 4.85 MIL/uL (ref 4.22–5.81)
RDW: 21.3 % — ABNORMAL HIGH (ref 11.5–15.5)
WBC: 6 10*3/uL (ref 4.0–10.5)
nRBC: 0 % (ref 0.0–0.2)

## 2021-05-22 LAB — COMPREHENSIVE METABOLIC PANEL
ALT: 16 U/L (ref 0–44)
AST: 19 U/L (ref 15–41)
Albumin: 3.5 g/dL (ref 3.5–5.0)
Alkaline Phosphatase: 28 U/L — ABNORMAL LOW (ref 38–126)
Anion gap: 7 (ref 5–15)
BUN: 13 mg/dL (ref 8–23)
CO2: 25 mmol/L (ref 22–32)
Calcium: 8.5 mg/dL — ABNORMAL LOW (ref 8.9–10.3)
Chloride: 104 mmol/L (ref 98–111)
Creatinine, Ser: 0.81 mg/dL (ref 0.61–1.24)
GFR, Estimated: >60 mL/min (ref >60)
Glucose, Bld: 90 mg/dL (ref 70–99)
Potassium: 3.9 mmol/L (ref 3.5–5.1)
Sodium: 136 mmol/L (ref 135–145)
Total Bilirubin: 0.4 mg/dL (ref 0.3–1.2)
Total Protein: 6.4 g/dL — ABNORMAL LOW (ref 6.5–8.1)

## 2021-05-23 ENCOUNTER — Other Ambulatory Visit: Payer: Self-pay | Admitting: *Deleted

## 2021-05-23 ENCOUNTER — Telehealth: Payer: Self-pay

## 2021-05-23 DIAGNOSIS — D472 Monoclonal gammopathy: Secondary | ICD-10-CM

## 2021-05-23 LAB — KAPPA/LAMBDA LIGHT CHAINS
Kappa free light chain: 34.6 mg/L — ABNORMAL HIGH (ref 3.3–19.4)
Kappa, lambda light chain ratio: 1.93 — ABNORMAL HIGH (ref 0.26–1.65)
Lambda free light chains: 17.9 mg/L (ref 5.7–26.3)

## 2021-05-23 NOTE — Telephone Encounter (Signed)
Please call patient to recommend patient start Niacinamide or Nicotinamide 500mg  twice per day to lower risk of non-melanoma skin cancer by approximately 25%. This is usually available at [employer]. Thank you!

## 2021-05-23 NOTE — Telephone Encounter (Signed)
Patient informed of Dr. Robbi Garter message.

## 2021-05-23 NOTE — Telephone Encounter (Signed)
-----   Message from Alfonso Patten, MD sent at 05/23/2021  1:23 PM EST ----- Regarding: RE: Laser hair removal around stoma. Thank you!  Can you please let him know that [facility] [place] said they could do laser hair removal for him around his stoma. He can schedule a consult and same day treatment if he would like. It can take a few treatments total for laser hair removal. Their phone number is [phone]. They should be able to give him an idea of price over the phone.  Another option could be to try Carlton Adam, though I'm not sure if the body glide could get in the way of it working well.  Thanks!   ----- Message ----- From: Rosaria Ferries, CMA Sent: 04/26/2021   8:59 AM EST To: Alfonso Patten, MD Subject: Laser hair removal around stoma.               Laser hair removal around stoma.

## 2021-05-24 ENCOUNTER — Encounter: Payer: Self-pay | Admitting: Dermatology

## 2021-05-24 ENCOUNTER — Other Ambulatory Visit: Payer: Self-pay

## 2021-05-24 DIAGNOSIS — D472 Monoclonal gammopathy: Secondary | ICD-10-CM

## 2021-05-24 LAB — FERRITIN: Ferritin: 6 ng/mL — ABNORMAL LOW (ref 24–336)

## 2021-05-24 LAB — IRON AND TIBC
Iron: 29 ug/dL — ABNORMAL LOW (ref 45–182)
Saturation Ratios: 7 % — ABNORMAL LOW (ref 17.9–39.5)
TIBC: 398 ug/dL (ref 250–450)
UIBC: 369 ug/dL

## 2021-05-28 ENCOUNTER — Inpatient Hospital Stay (HOSPITAL_BASED_OUTPATIENT_CLINIC_OR_DEPARTMENT_OTHER): Payer: Medicare HMO | Admitting: Nurse Practitioner

## 2021-05-28 DIAGNOSIS — D509 Iron deficiency anemia, unspecified: Secondary | ICD-10-CM

## 2021-05-28 DIAGNOSIS — D472 Monoclonal gammopathy: Secondary | ICD-10-CM

## 2021-05-28 LAB — MULTIPLE MYELOMA PANEL, SERUM
Albumin SerPl Elph-Mcnc: 3.2 g/dL (ref 2.9–4.4)
Albumin/Glob SerPl: 1.2 (ref 0.7–1.7)
Alpha 1: 0.2 g/dL (ref 0.0–0.4)
Alpha2 Glob SerPl Elph-Mcnc: 0.7 g/dL (ref 0.4–1.0)
B-Globulin SerPl Elph-Mcnc: 0.9 g/dL (ref 0.7–1.3)
Gamma Glob SerPl Elph-Mcnc: 0.8 g/dL (ref 0.4–1.8)
Globulin, Total: 2.7 g/dL (ref 2.2–3.9)
IgA: 113 mg/dL (ref 61–437)
IgG (Immunoglobin G), Serum: 735 mg/dL (ref 603–1613)
IgM (Immunoglobulin M), Srm: 195 mg/dL — ABNORMAL HIGH (ref 15–143)
M Protein SerPl Elph-Mcnc: 0.2 g/dL — ABNORMAL HIGH
Total Protein ELP: 5.9 g/dL — ABNORMAL LOW (ref 6.0–8.5)

## 2021-05-28 MED ORDER — FERROUS SULFATE 325 (65 FE) MG PO TBEC: 325.0000 mg | DELAYED_RELEASE_TABLET | Freq: Every day | ORAL | 1 refills | Status: DC

## 2021-05-28 NOTE — Progress Notes (Signed)
Hematology/Oncology Consult Note [facility]  [phone] [phone]  Virtual Visit Progress Note  I connected with Donald Barry on 05/28/21 at  2:15 PM EST by video enabled telemedicine visit and verified that I am speaking with the correct person using two identifiers.   I discussed the limitations, risks, security and privacy concerns of performing an evaluation and management service by telemedicine and the availability of in-person appointments. I also discussed with the patient that there may be a patient responsible charge related to this service. The patient expressed understanding and agreed to proceed.   Other persons participating in the visit and their role in the encounter: none   Patients location: none  Providers location: clinic   Chief Complaint: MGUS  Patient Care Team: Derinda Late, MD as PCP - General (Family Medicine) Sindy Guadeloupe, MD as Consulting Physician (Hematology and Oncology)   Name of the patient: Donald Barry  [id number]  Nov 21, 1945   Date of visit: 05/28/21  Diagnosis-IgM MGUS  Chief complaint/ Reason for visit- routine follow-up of MGUS  Heme/Onc history: patient is a 76 year old male with a past medical history of fatty liver and GERD who was seen by pulmonology clinic GI for symptoms of abdominal pain.  As a part of that work-up patient had SPEP done Which showed mildly elevated IgM levels of 202.  IgM monoclonal kappa light chain was noted on immunofixation 0.2 g.  Hence the patient has been referred to [place].  Of note patient had a normal CBC with a white count of 4.1, H&H of 14.8/43.2 in February 2020.  His platelet counts have always been mildly low in the 120s.  CMP was unremarkable.  Iron studies were normal.  Patient also had CT of the abdomen done as a part of microscopic hematuria in February 2020 which did not reveal any evidence of splenomegaly   Results of blood work from 09/24/2018  were as follows: CBC showed white count of 4.7, H&H of 14.7/43.1.  Count of 147.  CMP was within normal limits.  Multiple myeloma panel showed M protein IgM kappa 0.3 g.  Serum free light chains revealed a mildly elevated kappa free light chain 21.6 with a kappa lambda light chain ratio of 1.86.    Interval history-patient is 76 year old male with above history of MGUS who agrees to follow-up via telemedicine.  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  He continues to recover from influenza A. Has not been able to get back to cycling d/t health.   ECOG PS- 1 Pain scale- 0  Review of systems- Review of Systems  Constitutional:  Negative for chills, fever, malaise/fatigue and weight loss.  HENT:  Negative for hearing loss, nosebleeds, sore throat and tinnitus.   Eyes:  Negative for blurred vision and double vision.  Respiratory:  Negative for cough, hemoptysis, shortness of breath and wheezing.   Cardiovascular:  Negative for chest pain, palpitations and leg swelling.  Gastrointestinal:  Negative for abdominal pain, blood in stool, constipation, diarrhea, melena, nausea and vomiting.  Genitourinary:  Negative for dysuria and urgency.  Musculoskeletal:  Negative for back pain, falls, joint pain and myalgias.  Skin:  Negative for itching and rash.  Neurological:  Negative for dizziness, tingling, sensory change, loss of consciousness, weakness and headaches.  Endo/Heme/Allergies:  Negative for environmental allergies. Does not bruise/bleed easily.  Psychiatric/Behavioral:  Negative for depression. The patient is not nervous/anxious and does not have  insomnia.     Allergies  Allergen Reactions   Tape Dermatitis    Skin blistered- steri strip    Wound Dressing Adhesive Dermatitis    Skin blistered- steri strip   Shellfish Allergy Diarrhea and Nausea And Vomiting    Shrimp    Shellfish-Derived  Products Diarrhea, Nausea And Vomiting and Nausea Only   Onion Other (See Comments)    diarrhea Diarrhea- verified avoids all onion ingredient (including onion powder). MM, RD 12/19/20   Other Nausea And Vomiting    general anesthesia   Codeine Nausea Only     Past Medical History:  Diagnosis Date   Actinic keratosis    Hx of PDT Tx., face 1/252021 and scalp 07/05/2019   Allergic rhinitis, seasonal 06/14/2014   Allergy    Arthritis    Chronic neck pain 07/15/3612   Complication of anesthesia    Gastroesophageal reflux disease without esophagitis 06/14/2014   Headache    h/o migraines   Hepatic steatosis 04/05/2018   Hiatal hernia 09/09/2017   Intermittent left lower quadrant abdominal pain 07/15/2017   PONV (postoperative nausea and vomiting)    Sleep apnea    cpap   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 SCCis   Squamous cell skin cancer 07/2017   Resected from scalp.     Past Surgical History:  Procedure Laterality Date   BACK SURGERY  2008   L4, L5   COLONOSCOPY     cystectomy with prostatectomy ileal conduit     CYSTO WITH HYDRODISTENSION N/A 06/17/2019   Procedure: CYSTOSCOPY/HYDRODISTENSION;  Surgeon: Billey Co, MD;  Location: [facility];  Service: Urology;  Laterality: N/A;   CYSTOSCOPY WITH BIOPSY N/A 07/17/2018   Procedure: CYSTOSCOPY WITH BLADDER BIOPSY;  Surgeon: Billey Co, MD;  Location: [facility];  Service: Urology;  Laterality: N/A;   CYSTOSCOPY WITH BIOPSY N/A 06/17/2019   Procedure: CYSTOSCOPY WITH BLADDER  BIOPSY;  Surgeon: Billey Co, MD;  Location: [facility];  Service: Urology;  Laterality: N/A;   CYSTOSCOPY WITH FULGERATION N/A 07/17/2018   Procedure: CYSTOSCOPY WITH FULGERATION;  Surgeon: Billey Co, MD;  Location: [facility];  Service: Urology;  Laterality: N/A;   CYSTOSCOPY WITH FULGERATION N/A 06/17/2019   Procedure: CYSTOSCOPY WITH  FULGERATION;  Surgeon: Billey Co, MD;  Location: [facility];  Service: Urology;  Laterality: N/A;   ESOPHAGOGASTRODUODENOSCOPY     HERNIA REPAIR  4315   umbilical   STOMACH SURGERY     FUNDIC GLAND POLYP    Social History   Socioeconomic History   Marital status: Married    Spouse name: Not on file   Number of children: Not on file   Years of education: Not on file   Highest education level: Not on file  Occupational History   Not on file  Tobacco Use   Smoking status: Never   Smokeless tobacco: Never  Vaping Use   Vaping Use: Never used  Substance and Sexual Activity   Alcohol use: Never   Drug use: Never   Sexual activity: Yes    Birth control/protection: None  Other Topics Concern   Not on file  Social History Narrative   Not on file   Social Determinants of Health   Financial Resource Strain: Not on file  Food Insecurity: Not on file  Transportation Needs: Not on file  Physical Activity: Not on file  Stress: Not on file  Social Connections: Not on file  Intimate Partner Violence: Not on file    Family History  Problem Relation Age of Onset   Hypertension Mother    GER disease Sister    GER disease Brother    Prostate cancer Neg Hx    Kidney cancer Neg Hx      Current Outpatient Medications:    aspirin EC 81 MG tablet, Take 81 mg by mouth daily. , Disp: , Rfl:    atorvastatin (LIPITOR) 10 MG tablet, Take 10 mg by mouth daily., Disp: , Rfl:    Multiple Vitamin (MULTIVITAMIN WITH MINERALS) TABS tablet, Take 1 tablet by mouth daily. Iron Free, Disp: , Rfl:    omeprazole (PRILOSEC) 40 MG capsule, Take by mouth., Disp: , Rfl:   Physical exam:  There were no vitals filed for this visit.  Physical Exam Vitals reviewed.  Constitutional:      Appearance: He is not ill-appearing.  Pulmonary:     Effort: No respiratory distress.  Skin:    Coloration: Skin is not pale.  Neurological:     Mental Status: He is alert and oriented to person, place, and  time.  Psychiatric:        Mood and Affect: Mood normal.        Behavior: Behavior normal.     CMP Latest Ref Rng & Units 05/22/2021  Glucose 70 - 99 mg/dL 90  BUN 8 - 23 mg/dL 13  Creatinine 0.61 - 1.24 mg/dL 0.81  Sodium 135 - 145 mmol/L 136  Potassium 3.5 - 5.1 mmol/L 3.9  Chloride 98 - 111 mmol/L 104  CO2 22 - 32 mmol/L 25  Calcium 8.9 - 10.3 mg/dL 8.5(L)  Total Protein 6.5 - 8.1 g/dL 6.4(L)  Total Bilirubin 0.3 - 1.2 mg/dL 0.4  Alkaline Phos 38 - 126 U/L 28(L)  AST 15 - 41 U/L 19  ALT 0 - 44 U/L 16   CBC Latest Ref Rng & Units 05/22/2021  WBC 4.0 - 10.5 K/uL 6.0  Hemoglobin 13.0 - 17.0 g/dL 10.7(L)  Hematocrit 39.0 - 52.0 % 35.2(L)  Platelets 150 - 400 K/uL 211   Iron/TIBC/Ferritin/ %Sat    Component Value Date/Time   IRON 29 (L) 05/22/2021 0911   TIBC 398 05/22/2021 0911   FERRITIN 6 (L) 05/22/2021 0911   IRONPCTSAT 7 (L) 05/22/2021 0911   Component Ref Range & Units 6 d ago (05/22/21) 1 yr ago (05/15/20) 2 yr ago (04/15/19) 2 yr ago (09/24/18)  Kappa free light chain 3.3 - 19.4 mg/L 34.6 High   28.2 High   23.6 High   21.6 High    Lambda free light chains 5.7 - 26.3 mg/L 17.9  16.1  12.7  11.6   Kappa, lambda light chain ratio 0.26 - 1.65 1.93 High   1.75 High  CM  1.86 High  CM  1.86 High  CM     Assessment and plan- Patient is a 76 y.o. male with IgM MGUS here for routine follow-up  Iron Deficiency Anemia- etiology unclear- GI vs urologic/interstitial cystitis vs other etiologies. Previously followed by [facility]. I recommended he call them for appt for evaluation in setting of new IDA. He will do trial oral iron. Prescription for ferrous sulfate 325 mg daily sent to pharmacy. If patient is unable to tolerate he will notify clinic so we can see him sooner to discuss IV iron as alternative.  MGUS- MM panel pending at time of visit. Serum  free light chain ratio is stable at 1.95. No symptoms or other findings concerning for progression of MGUS. Can check cmp, mm panel and  serum free light chains annually.   Return to clinic  3 mo- lab (cbc, ferritin, iron studies) Day to week later see Dr. Janese Banks for +/- iv iron- la  Visit Diagnosis 1. MGUS (monoclonal gammopathy of unknown significance)    I discussed the assessment and treatment plan with the patient. The patient was provided an opportunity to ask questions and all were answered. The patient agreed with the plan and demonstrated an understanding of the instructions.   The patient was advised to call back or seek an in-person evaluation if the symptoms worsen or if the condition fails to improve as anticipated.   I spent 20 minutes face-to-face video visit time dedicated to the care of this patient on the date of this encounter to include pre-visit review of <specify which records>, face-to-face time with the patient, and post visit ordering of testing/documentation.   Beckey Rutter, DNP, AGNP-C [place] [facility] [phone] (clinic)

## 2021-05-28 NOTE — Progress Notes (Signed)
Pt confirmed availablity for mychart visit as scheduled. Pt has questions about lab results. No other concerns/complaints at this time.

## 2021-05-31 ENCOUNTER — Other Ambulatory Visit: Payer: Self-pay | Admitting: *Deleted

## 2021-05-31 DIAGNOSIS — D509 Iron deficiency anemia, unspecified: Secondary | ICD-10-CM

## 2021-06-11 ENCOUNTER — Other Ambulatory Visit: Payer: Self-pay | Admitting: *Deleted

## 2021-06-11 MED ORDER — FERROUS SULFATE 325 (65 FE) MG PO TBEC: 325.0000 mg | DELAYED_RELEASE_TABLET | Freq: Every day | ORAL | 1 refills | Status: DC

## 2021-06-13 ENCOUNTER — Emergency Department: Payer: Medicare HMO

## 2021-06-13 ENCOUNTER — Other Ambulatory Visit: Payer: Self-pay

## 2021-06-13 ENCOUNTER — Emergency Department
Admission: EM | Admit: 2021-06-13 | Discharge: 2021-06-13 | Disposition: A | Discharge status: Home / Self Care | Payer: Medicare HMO | Attending: Emergency Medicine | Admitting: Emergency Medicine

## 2021-06-13 DIAGNOSIS — D649 Anemia, unspecified: Secondary | ICD-10-CM | POA: Insufficient documentation

## 2021-06-13 DIAGNOSIS — I480 Paroxysmal atrial fibrillation: Secondary | ICD-10-CM | POA: Insufficient documentation

## 2021-06-13 LAB — BASIC METABOLIC PANEL
Anion gap: 8 (ref 5–15)
BUN: 18 mg/dL (ref 8–23)
CO2: 25 mmol/L (ref 22–32)
Calcium: 9.4 mg/dL (ref 8.9–10.3)
Chloride: 107 mmol/L (ref 98–111)
Creatinine, Ser: 0.82 mg/dL (ref 0.61–1.24)
GFR, Estimated: >60 mL/min (ref >60)
Glucose, Bld: 100 mg/dL — ABNORMAL HIGH (ref 70–99)
Potassium: 3.8 mmol/L (ref 3.5–5.1)
Sodium: 140 mmol/L (ref 135–145)

## 2021-06-13 LAB — CBC
HCT: 39.1 % (ref 39.0–52.0)
Hemoglobin: 11.8 g/dL — ABNORMAL LOW (ref 13.0–17.0)
MCH: 22.5 pg — ABNORMAL LOW (ref 26.0–34.0)
MCHC: 30.2 g/dL (ref 30.0–36.0)
MCV: 74.5 fL — ABNORMAL LOW (ref 80.0–100.0)
Platelets: 208 10*3/uL (ref 150–400)
RBC: 5.25 MIL/uL (ref 4.22–5.81)
RDW: 21.2 % — ABNORMAL HIGH (ref 11.5–15.5)
WBC: 5.9 10*3/uL (ref 4.0–10.5)
nRBC: 0 % (ref 0.0–0.2)

## 2021-06-13 LAB — TROPONIN I (HIGH SENSITIVITY)
Troponin I (High Sensitivity): 6 ng/L (ref <18)
Troponin I (High Sensitivity): 15 ng/L (ref <18)

## 2021-06-13 LAB — D-DIMER, QUANTITATIVE: D-Dimer, Quant: 0.94 ug/mL-FEU — ABNORMAL HIGH (ref 0.00–0.50)

## 2021-06-13 MED ORDER — METOPROLOL TARTRATE 25 MG PO TABS
12.5000 mg | ORAL_TABLET | ORAL | Status: AC
  Administered 2021-06-13: 12.5 mg via ORAL
  Filled 2021-06-13: qty 1

## 2021-06-13 MED ORDER — IOHEXOL 350 MG/ML SOLN
75.0000 mL | Freq: Once | INTRAVENOUS | Status: AC | PRN
  Administered 2021-06-13: 75 mL via INTRAVENOUS

## 2021-06-13 MED ORDER — METOPROLOL TARTRATE 25 MG PO TABS: 12.5000 mg | ORAL_TABLET | Freq: Two times a day (BID) | ORAL | 0 refills | Status: DC

## 2021-06-13 NOTE — ED Notes (Signed)
Ambulatory to restroom

## 2021-06-13 NOTE — ED Provider Notes (Signed)
[facility] Provider Note    Event Date/Time   First MD Initiated Contact with Patient 06/13/21 1109     (approximate)   History   Atrial Fibrillation   HPI  Donald Barry is a 76 y.o. male  GERD, NAFLD, thrombocytopenia, and interstitial cystitis status post cystectomy with ileal conduit 12/18/2020 .  Had brief paroxysmal A. fib while in the hospital in September  Patient has sleep study last night reports did not sleep well during it.  He got home and rested briefly but then woke up and noticed his heart felt like it was beating irregularly and he felt slightly lightheaded.  He had a very brief discomfort in his chest with it but that is since gone away  Did have fairly significant surgery and September 2022 and reports he had episode of A. fib then it feels the same today  No shortness of breath.  No leg swelling.  Personally reviewed the patient's discharge summary from September 2022      Physical Exam   Triage Vital Signs: ED Triage Vitals  Enc Vitals Group     BP 06/13/21 1045 106/79     Pulse Rate 06/13/21 1045 (!) 140     Resp 06/13/21 1045 18     Temp 06/13/21 1045 98 F (36.7 C)     Temp src --      SpO2 06/13/21 1045 100 %     Weight --      Height --      Head Circumference --      Peak Flow --      Pain Score 06/13/21 1043 4     Pain Loc --      Pain Edu? --      Excl. in [place]? --     Most recent vital signs: Vitals:   06/13/21 1500 06/13/21 1616  BP:  (!) 117/94  Pulse: 88 96  Resp: 19 18  Temp:    SpO2: 96% 98%     General: Awake, no distress.  CV:  Good peripheral perfusion.  This is a moderate tachycardia, irregular but no murmurs or rubs Resp:  Normal effort.  Clear lung sounds bilaterally normal respiratory effort Abd:  No distention.  No pain to palpation of the abdomen in any quadrant Other:  Very minimal slight to trace edema just at the ankles bilateral.  No leg swelling unilaterally no venous cords or  congestion   ED Results / Procedures / Treatments   Labs (all labs ordered are listed, but only abnormal results are displayed) Labs Reviewed  BASIC METABOLIC PANEL - Abnormal; Notable for the following components:      Result Value   Glucose, Bld 100 (*)    All other components within normal limits  CBC - Abnormal; Notable for the following components:   Hemoglobin 11.8 (*)    MCV 74.5 (*)    MCH 22.5 (*)    RDW 21.2 (*)    All other components within normal limits  D-DIMER, QUANTITATIVE - Abnormal; Notable for the following components:   D-Dimer, Quant 0.94 (*)    All other components within normal limits  TROPONIN I (HIGH SENSITIVITY)  TROPONIN I (HIGH SENSITIVITY)     EKG  Reviewed inter me at 1042 Heart rate 125 QRS 80 QTc 430 A. fib, mild nonspecific T wave abnormality.  No evidence of obvious acute ischemia  Dr. Mylo Red also reviewed this EKG with me while he was in  the ER discussed in the consult   RADIOLOGY  Patient's chest x-ray viewed by me, no acute findings noted   CT of the chest reviewed, negative for pulmonary embolism.  Minimal calcification of coronary arteries.  Hiatal hernia.  Cholelithiasis.  I discussed this with the patient, patient actually already aware of his CT findings having read them on MyChart    PROCEDURES:  Critical Care performed: No  Procedures   MEDICATIONS ORDERED IN ED: Medications  metoprolol tartrate (LOPRESSOR) tablet 12.5 mg (12.5 mg Oral Given 06/13/21 1217)  iohexol (OMNIPAQUE) 350 MG/ML injection 75 mL (75 mLs Intravenous Contrast Given 06/13/21 1231)     IMPRESSION / MDM / [place] / ED COURSE  I reviewed the triage vital signs and the nursing notes.                              Differential diagnosis includes, but is not limited to, paroxysmal A. fib, ACS though this seems unlikely based on clinical history and presentation, pulmonary embolism, pneumonia, pericardial effusion, etc.     Patient  maintained on cardiac monitor for evaluation and monitoring of arrhythmia  Personally viewed the patient's imaging and reviewed independently.  Reviewed the patient's lab work, obtained and discussed consultation with Dr. From cardiology  Labs noted for mild anemia actually improved from previous check.  2 normal troponins.  Elevated D-dimer with CT angiogram performed  Clinical Course as of 06/13/21 1627  Wed Jun 13, 2021  1216 I obtain consultation from Dr. Mylo Red who came to the ER, evaluate the patient's clinical history and EKG.  Discussed the case and he recommended patient be started on 12.5 mg twice daily Lopressor and have close follow-up with cardiology as well as GI.  Given the patient's ongoing GI work-up, he did recommend against starting anticoagulant due to concerns for the patient's history of recent anemia and further need for gastrointestinal work-up prior to starting an anticoagulant.  Discussed this with the patient and his wife, we discussed risks and benefits of use of a blood thinner in particular his case with bleeding risk versus weighing the risk of stroke from A. fib.  Patient will be treated with Lopressor and we will not start anticoagulation at this time [MQ]  1612 Patient able to stand ambulate independently.  No ongoing concerns, does have mild hypotension after Lopressor.  However plan to prescribe a low-dose he has plan for close cardiology follow-up.  We discussed his blood pressure, and at this point he seems to be tolerating this well reports he has relatively low blood pressures at baseline.  Able to ambulate, no distress.  Discussed follow-up with cardiology patient very agreeable with this, and discussed his case with Dr. Mylo Red. [MQ]    Clinical Course User Index [MQ] Delman Kitten, MD   Vitals:   06/13/21 1500 06/13/21 1616  BP:  (!) 117/94  Pulse: 88 96  Resp: 19 18  Temp:    SpO2: 96% 98%  Patient tolerated treatment well, rate controlled after low-dose  Lopressor.  Will discharge home with same with a plan to follow-up closely with cardiology and also with his planned gastroenterology evaluations and colonoscopy.  We did discuss and determination made that risks outweighs benefits of anticoagulation at this point pending further GI consultation  Return precautions and treatment recommendations and follow-up discussed with the patient who is agreeable with the plan.   FINAL CLINICAL IMPRESSION(S) / ED DIAGNOSES  Final diagnoses:  Paroxysmal atrial fibrillation ([place])     Rx / DC Orders   ED Discharge Orders          Ordered    metoprolol tartrate (LOPRESSOR) 25 MG tablet  2 times daily        06/13/21 1615    Ambulatory referral to Cardiology       Comments: Afib. Not anticoagulated   06/13/21 1621             Note:  This document was prepared using Dragon voice recognition software and may include unintentional dictation errors.   Delman Kitten, MD 06/13/21 [phone]

## 2021-06-13 NOTE — ED Triage Notes (Signed)
Pt comes with c/o Afib. Pt states he had a episode at home and felt his chest was beating fast. Pt states some pain in upperright chest and shoulder pain. Pt states some dizziness.  Pt states this did happen once before back in September 2022.

## 2021-06-13 NOTE — ED Notes (Signed)
See triage note. Pt here for irregular Hr and discomfort to left shoulder and upper chest that started this am. +dizziness. Does not take meds for a fib.

## 2021-06-15 ENCOUNTER — Ambulatory Visit (INDEPENDENT_AMBULATORY_CARE_PROVIDER_SITE_OTHER): Payer: Medicare HMO | Admitting: Cardiovascular Disease

## 2021-06-15 ENCOUNTER — Encounter: Payer: Self-pay | Admitting: Cardiovascular Disease

## 2021-06-15 ENCOUNTER — Other Ambulatory Visit: Payer: Self-pay

## 2021-06-15 VITALS — BP 120/62 | HR 85 | Ht 72.0 in | Wt 200.5 lb

## 2021-06-15 DIAGNOSIS — I480 Paroxysmal atrial fibrillation: Secondary | ICD-10-CM | POA: Diagnosis not present

## 2021-06-15 DIAGNOSIS — E785 Hyperlipidemia, unspecified: Secondary | ICD-10-CM | POA: Diagnosis not present

## 2021-06-15 NOTE — Patient Instructions (Signed)
Medication Instructions:  Your physician recommends that you continue on your current medications as directed. Please refer to the Current Medication list given to you today.  *If you need a refill on your cardiac medications before your next appointment, please call your pharmacy*   Lab Work: None ordered If you have labs (blood work) drawn today and your tests are completely normal, you will receive your results only by: [place] (if you have MyChart) OR A paper copy in the mail If you have any lab test that is abnormal or we need to change your treatment, we will call you to review the results.   Testing/Procedures: None ordered   Follow-Up: At [facility], you and your health needs are our priority.  As part of our continuing mission to provide you with exceptional heart care, we have created designated Provider Care Teams.  These Care Teams include your primary Cardiologist (physician) and Advanced Practice Providers (APPs -  Physician Assistants and Nurse Practitioners) who all work together to provide you with the care you need, when you need it.  We recommend signing up for the patient portal called "MyChart".  Sign up information is provided on this After Visit Summary.  MyChart is used to connect with patients for Virtual Visits (Telemedicine).  Patients are able to view lab/test results, encounter notes, upcoming appointments, etc.  Non-urgent messages can be sent to your provider as well.   To learn more about what you can do with MyChart, go to [email or web address].    Your next appointment:   2 month(s)  The format for your next appointment:   In Person  Provider:   You may see Kathlyn [place], MD or one of the following Advanced Practice Providers on your designated Care Team:   Murray Hodgkins, NP Christell Faith, PA-C Cadence Kathlen Mody, PA-C{   Other Instructions N/A

## 2021-06-15 NOTE — Progress Notes (Signed)
Cardiology Office Note   Date:  06/15/2021   ID:  Donald, Barry October 21, 1945, MRN [id number]  PCP:  Donald Late, MD  Cardiologist:   Donald Sacramento, MD   Chief Complaint  Patient presents with   Other    F/u ED Afib c/o rapid heart rate. Meds reviewed verbally with pt.      History of Present Illness: Donald Barry is a 76 y.o. male who was referred from [facility] ED for evaluation of atrial fibrillation.  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 He was hospitalized in September with constipation and  pyelonephritis complicated by sepsis due to Enterobacter.  He did have atrial fibrillation with RVR during that admission which was treated with rate control and subsequently converted to sinus rhythm.  He was not anticoagulated due to CHA2DS2-VASc score of 1 and other medical issues making him high risk for bleeding. He had an echocardiogram done during that admission which was personally reviewed by me and showed low normal LV systolic function with an EF of 50 to 55%, calcified aortic valve without significant stenosis, normal diastolic function and dilated IVC. The patient had a recent sleep study done and did not sleep well during that.  Next day, he woke up with palpitations and tachycardia and went to the emergency room where he was noted to be in atrial fibrillation with RVR.  He was discharged home on small dose metoprolol.  He continued in atrial fibrillation for about 24 hours and then converted to sinus rhythm.  He feels better overall.  He denies chest pain or significant dyspnea. He has no diabetes, hypertension, heart failure or previous stroke.  He does not smoke or drink alcohol. Is currently dealing with iron deficiency anemia and will be undergoing EGD and colonoscopy in the near future.  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 SCCis   Squamous cell skin cancer 07/2017   Resected from scalp.    Past Surgical History:  Procedure Laterality Date   BACK SURGERY  2008   L4, L5   COLONOSCOPY     cystectomy with prostatectomy ileal conduit     CYSTO WITH HYDRODISTENSION N/A 06/17/2019   Procedure: CYSTOSCOPY/HYDRODISTENSION;  Surgeon: Donald Co, MD;  Location: [facility] ORS;  Service: Urology;  Laterality: N/A;   CYSTOSCOPY WITH BIOPSY N/A 07/17/2018   Procedure: CYSTOSCOPY WITH BLADDER BIOPSY;  Surgeon: Donald Co, MD;  Location: [facility] ORS;  Service: Urology;  Laterality: N/A;   CYSTOSCOPY WITH BIOPSY N/A 06/17/2019   Procedure: CYSTOSCOPY WITH BLADDER  BIOPSY;  Surgeon: Donald Co, MD;  Location: [facility] ORS;  Service: Urology;  Laterality: N/A;   CYSTOSCOPY WITH FULGERATION N/A 07/17/2018  Procedure: CYSTOSCOPY WITH FULGERATION;  Surgeon: Donald Co, MD;  Location: [facility] ORS;  Service: Urology;  Laterality: N/A;   CYSTOSCOPY WITH FULGERATION N/A 06/17/2019   Procedure: CYSTOSCOPY WITH FULGERATION;  Surgeon: Donald Co, MD;  Location: [facility];  Service: Urology;  Laterality: N/A;   ESOPHAGOGASTRODUODENOSCOPY     HERNIA REPAIR  0086   umbilical   STOMACH SURGERY     FUNDIC GLAND POLYP     Current Outpatient Medications  Medication Sig Dispense Refill   aspirin EC 81 MG tablet Take 81 mg by mouth daily.       atorvastatin (LIPITOR) 10 MG tablet Take 10 mg by mouth daily.     guaiFENesin (MUCINEX) 600 MG 12 hr tablet Take by mouth 2 (two) times daily.     metoprolol tartrate (LOPRESSOR) 25 MG tablet Take 0.5 tablets (12.5 mg total) by mouth 2 (two) times daily. 30 tablet 0   Multiple Vitamin (MULTIVITAMIN WITH MINERALS) TABS tablet Take 1 tablet by mouth daily. Iron Free     omeprazole (PRILOSEC) 40 MG capsule Take by mouth.     No current facility-administered medications for this visit.    Allergies:   Tape, Wound dressing adhesive, Shellfish allergy, Shellfish-derived products, Onion, Other, and Codeine    Social History:  The patient  reports that he has never smoked. He has never used smokeless tobacco. He reports that he does not drink alcohol and does not use drugs.   Family History:  The patient's family history includes GER disease in his brother and sister; Heart disease in his father; Hypertension in his mother.    ROS:  Please see the history of present illness.   Otherwise, review of systems are positive for none.   All other systems are reviewed and negative.    PHYSICAL EXAM: VS:  BP 120/62 (BP Location: Right Arm, Patient Position: Sitting, Cuff Size: Normal)    Ht 6' (1.829 m)    Wt 200 lb 8 oz (90.9 kg)    BMI 27.19 kg/m  , BMI Body mass index is 27.19 kg/m. GEN: Well nourished, well developed, in no acute distress  HEENT: normal  Neck: no JVD, carotid bruits, or masses Cardiac: RRR; no murmurs, rubs, or gallops,no edema  Respiratory:  clear to auscultation bilaterally, normal work of breathing GI: soft, nontender, nondistended, + BS MS: no deformity or atrophy  Skin: warm and dry, no rash Neuro:  Strength and sensation are intact Psych: euthymic mood, full affect   EKG:  EKG is ordered today. The ekg ordered today demonstrates normal sinus rhythm with no significant ST or T wave changes.   Recent Labs: 01/12/2021: TSH 1.384 01/13/2021: Magnesium  2.1 05/22/2021: ALT 16 06/13/2021: BUN 18; Creatinine, Ser 0.82; Hemoglobin 11.8; Platelets 208; Potassium 3.8; Sodium 140    Lipid Panel No results found for: CHOL, TRIG, HDL, CHOLHDL, VLDL, LDLCALC, LDLDIRECT    Wt Readings from Last 3 Encounters:  06/15/21 200 lb 8 oz (90.9 kg)  05/21/21 199 lb (90.3 kg)  01/13/21 207 lb 4.8 oz (94 kg)       PAD Screen 06/15/2021  Previous PAD dx? No  Previous surgical procedure? Yes  Pain with walking? No  Feet/toe relief with dangling? No  Painful, non-healing ulcers? No  Extremities discolored? No      ASSESSMENT AND PLAN:  1.  Paroxysmal atrial fibrillation: He is back in sinus rhythm.  I discussed with him the natural history and management of paroxysmal atrial fibrillation.  Recommend continuing small dose metoprolol 12.5 mg twice daily.  I asked him to take a full dose if he goes into atrial fibrillation.  CHA2DS2-VASc score is 1 but will be due in May when he turns 76 years old.  I plan to start anticoagulation in the near future and this was discussed with him today.  However, I want to wait until his GI work-up is done for iron deficiency anemia. If atrial fibrillation becomes more frequent, recommend EP referral for A. fib ablation. I discussed with him the correlation between sleep apnea and atrial fibrillation.  2.  Sleep apnea, the patient reports being on CPAP for a while but had a recent sleep study and the results are not available.  Discussed the importance of optimal control of this.  3.  Hyperlipidemia: Currently on atorvastatin.    Disposition:   FU in 2 months. Signed,  Donald Sacramento, MD  06/15/2021 8:22 AM    [facility]

## 2021-06-20 ENCOUNTER — Ambulatory Visit: Payer: Medicare HMO | Admitting: Internal Medicine

## 2021-08-02 ENCOUNTER — Other Ambulatory Visit: Payer: Self-pay | Admitting: Gastroenterology

## 2021-08-02 DIAGNOSIS — K5710 Diverticulosis of small intestine without perforation or abscess without bleeding: Secondary | ICD-10-CM

## 2021-08-02 DIAGNOSIS — D509 Iron deficiency anemia, unspecified: Secondary | ICD-10-CM

## 2021-08-10 ENCOUNTER — Ambulatory Visit
Admission: RE | Admit: 2021-08-10 | Discharge: 2021-08-10 | Disposition: A | Discharge status: Home / Self Care | Payer: Medicare HMO | Source: Ambulatory Visit | Attending: Gastroenterology | Admitting: Gastroenterology

## 2021-08-10 DIAGNOSIS — D509 Iron deficiency anemia, unspecified: Secondary | ICD-10-CM | POA: Diagnosis not present

## 2021-08-10 DIAGNOSIS — K5710 Diverticulosis of small intestine without perforation or abscess without bleeding: Secondary | ICD-10-CM | POA: Insufficient documentation

## 2021-08-10 MED ORDER — IOHEXOL 300 MG/ML  SOLN
100.0000 mL | Freq: Once | INTRAMUSCULAR | Status: AC | PRN
  Administered 2021-08-10: 100 mL via INTRAVENOUS

## 2021-08-14 ENCOUNTER — Encounter: Payer: Self-pay | Admitting: Cardiovascular Disease

## 2021-08-14 ENCOUNTER — Ambulatory Visit (INDEPENDENT_AMBULATORY_CARE_PROVIDER_SITE_OTHER): Payer: Medicare HMO | Admitting: Cardiovascular Disease

## 2021-08-14 VITALS — BP 118/70 | HR 78 | Ht 72.0 in | Wt 205.5 lb

## 2021-08-14 DIAGNOSIS — I480 Paroxysmal atrial fibrillation: Secondary | ICD-10-CM

## 2021-08-14 DIAGNOSIS — E785 Hyperlipidemia, unspecified: Secondary | ICD-10-CM

## 2021-08-14 DIAGNOSIS — K5730 Diverticulosis of large intestine without perforation or abscess without bleeding: Secondary | ICD-10-CM | POA: Insufficient documentation

## 2021-08-14 DIAGNOSIS — G4730 Sleep apnea, unspecified: Secondary | ICD-10-CM | POA: Diagnosis not present

## 2021-08-14 DIAGNOSIS — Z862 Personal history of diseases of the blood and blood-forming organs and certain disorders involving the immune mechanism: Secondary | ICD-10-CM | POA: Insufficient documentation

## 2021-08-14 NOTE — Progress Notes (Signed)
?  ?Cardiology Office Note ? ? ?Date:  08/14/2021  ? ?ID:  [name], DOB Jan 23, 1946, MRN [id number] ? ?PCP:  Derinda Late, MD  ?Cardiologist:   Kathlyn Sacramento, MD  ? ?Chief Complaint  ?Patient presents with  ? Other  ?  2 month f/u pt would like to discuss his CT results. Meds reviewed verbally with pt.  ? ? ?  ?History of Present Illness: ?Donald Barry is a 76 y.o. male who is here today for follow-up visit regarding paroxysmal atrial fibrillation. 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 ?He was hospitalized in September with constipation and  pyelonephritis complicated by sepsis due to Enterobacter.  He did have atrial fibrillation with RVR during that admission which was treated with rate control and subsequently converted to sinus rhythm.  He was not anticoagulated due to CHA2DS2-VASc score of 1 and other medical issues making him high risk for bleeding. ?He had an echocardiogram done during that admission which was personally reviewed by me and showed low normal LV systolic function with an EF of 50 to 55%, calcified aortic valve without significant stenosis, normal diastolic function and dilated IVC. ?He had an emergency room visit for A-fib with RVR in the setting of sleep study.  He was discharged on small dose metoprolol and was noted to be in sinus rhythm after that.  No recurrent atrial fibrillation since then.  The patient underwent extensive GI work-up for blood loss anemia which has been nonrevealing.  He denies chest pain, shortness of breath or palpitations.   ?The patient is supposed to get his CPAP machine soon. ? ?Past Medical History:  ?Diagnosis Date  ? Actinic keratosis   ? Hx of PDT Tx., face 1/252021 and scalp 07/05/2019  ? Allergic rhinitis, seasonal 06/14/2014  ? Allergy   ? Arthritis   ? Chronic neck pain 06/14/2014  ? Complication of anesthesia   ? Gastroesophageal  reflux disease without esophagitis 06/14/2014  ? Headache   ? h/o migraines  ? Hepatic steatosis 04/05/2018  ? Hiatal hernia 09/09/2017  ? Intermittent left lower quadrant abdominal pain 07/15/2017  ? PONV (postoperative nausea and vomiting)   ? Sleep apnea   ? cpap  ? 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 Right vertex scalp. KA-type  ? Squamous cell carcinoma of skin 12/22/2019  ? Left vertex scalp. SCCis  ? Squamous cell skin cancer 07/2017  ? Resected from scalp.  ? ? ?Past Surgical History:  ?Procedure Laterality Date  ? BACK SURGERY  2008  ? L4, L5  ? COLONOSCOPY    ? cystectomy with prostatectomy ileal conduit    ? CYSTO WITH HYDRODISTENSION N/A 06/17/2019  ? Procedure: CYSTOSCOPY/HYDRODISTENSION;  Surgeon: Billey Co, MD;  Location: [facility];  Service: Urology;  Laterality: N/A;  ? CYSTOSCOPY WITH BIOPSY N/A 07/17/2018  ? Procedure: CYSTOSCOPY WITH BLADDER BIOPSY;  Surgeon: Billey Co, MD;  Location: [facility];  Service: Urology;  Laterality: N/A;  ? CYSTOSCOPY WITH BIOPSY N/A 06/17/2019  ? Procedure: CYSTOSCOPY WITH BLADDER  BIOPSY;  Surgeon: Billey Co, MD;  Location: [facility];  Service: Urology;  Laterality: N/A;  ? [place] N/A 07/17/2018  ? Procedure: CYSTOSCOPY WITH FULGERATION;  Surgeon: Billey Co, MD;  Location: [facility];  Service: Urology;  Laterality: N/A;  ? CYSTOSCOPY WITH FULGERATION N/A 06/17/2019  ? Procedure: CYSTOSCOPY WITH FULGERATION;  Surgeon: Billey Co, MD;  Location: [facility];  Service: Urology;  Laterality: N/A;  ? ESOPHAGOGASTRODUODENOSCOPY    ? HERNIA REPAIR  2683  ? umbilical  ? STOMACH SURGERY    ? FUNDIC GLAND POLYP  ? ? ? ?Current Outpatient Medications  ?Medication Sig Dispense Refill  ? aspirin EC 81 MG tablet Take 81 mg by mouth daily.     ? atorvastatin (LIPITOR) 10 MG tablet Take 10 mg by mouth daily.    ? ferrous sulfate 325 (65 FE) MG EC tablet Take 1 tablet by mouth daily.    ? guaiFENesin (MUCINEX) 600 MG 12 hr tablet  Take by mouth 2 (two) times daily.    ? metoprolol tartrate (LOPRESSOR) 25 MG tablet Take 0.5 tablets (12.5 mg total) by mouth 2 (two) times daily. 30 tablet 0  ? Multiple Vitamin (MULTIVITAMIN WITH MINERALS) TABS tablet Take 1 tablet by mouth daily. Iron Free    ? omeprazole (PRILOSEC) 40 MG capsule Take by mouth.    ? ?No current facility-administered medications for this visit.  ? ? ?Allergies:   Tape, Wound dressing adhesive, Shellfish allergy, Shellfish-derived products, Onion, Other, and Codeine  ? ? ?Social History:  The patient  reports that he has never smoked. He has never used smokeless tobacco. He reports that he does not drink alcohol and does not use drugs.  ? ?Family History:  The patient's family history includes GER disease in his brother and sister; Heart disease in his father; Hypertension in his mother.  ? ? ?ROS:  Please see the history of present illness.   Otherwise, review of systems are positive for none.   All other systems are reviewed and negative.  ? ? ?PHYSICAL EXAM: ?VS:  BP 118/70 (BP Location: Left Arm, Patient Position: Sitting, Cuff Size: Normal)   Pulse 78   Ht 6' (1.829 m)   Wt 205 lb 8 oz (93.2 kg)   SpO2 98%   BMI 27.87 kg/m?  , BMI Body mass index is 27.87 kg/m?. ?GEN: Well nourished, well developed, in no acute distress  ?HEENT: normal  ?Neck: no JVD, carotid bruits, or masses ?Cardiac: RRR; no murmurs, rubs, or gallops,no edema  ?Respiratory:  clear to auscultation bilaterally, normal work of breathing ?GI: soft, nontender, nondistended, + BS ?MS: no deformity or atrophy  ?Skin: warm and dry, no rash ?Neuro:  Strength and sensation are intact ?Psych: euthymic mood, full affect ? ? ?EKG:  EKG is not ordered today. ? ? ? ?Recent Labs: ?01/12/2021: TSH 1.384 ?01/13/2021: Magnesium 2.1 ?05/22/2021: ALT 16 ?06/13/2021: BUN 18; Creatinine, Ser 0.82; Hemoglobin 11.8; Platelets 208; Potassium 3.8; Sodium 140  ? ? ?Lipid Panel ?No results found for: CHOL, TRIG, HDL, CHOLHDL, VLDL,  LDLCALC, LDLDIRECT ?  ? ?Wt Readings from Last 3 Encounters:  ?08/14/21 205 lb 8 oz (93.2 kg)  ?06/15/21 200 lb 8 oz (90.9 kg)  ?05/21/21 199 lb (90.3 kg)  ?  ? ? ? ? ?  06/15/2021  ?  8:10 AM  ?PAD Screen  ?Previous PAD dx? No  ?Previous surgical procedure? Yes  ?Pain with walking? No  ?Feet/toe relief with dangling? No  ?Painful, non-healing ulcers? No  ?Extremities discolored? No  ? ? ? ? ?ASSESSMENT AND PLAN: ? ?1.  Paroxysmal atrial fibrillation: No recurrent atrial fibrillation.  CHA2DS2-VASc score is 1 and soon to be 2.  He continues to be anemic and his GI work-up has been nonrevealing.  Given relatively low burden of A-fib, will hold  off on initiating anticoagulation at this point.  Hopefully his hemoglobin will return to normal in the near future.  Continue small dose metoprolol. ? ?2.  Sleep apnea, I discussed with him the importance of treating this.  He is supposed to get a CPAP machine in the near future. ? ?3.  Hyperlipidemia: Currently on atorvastatin. ? ? ? ?Disposition:   FU in 6 months. ?Signed, ? ?Kathlyn Sacramento, MD  ?08/14/2021 5:11 PM    ?[place] ?

## 2021-08-14 NOTE — Patient Instructions (Signed)

## 2021-08-27 ENCOUNTER — Inpatient Hospital Stay: Payer: Medicare HMO | Attending: Oncology

## 2021-08-27 DIAGNOSIS — D509 Iron deficiency anemia, unspecified: Secondary | ICD-10-CM | POA: Insufficient documentation

## 2021-08-27 DIAGNOSIS — D472 Monoclonal gammopathy: Secondary | ICD-10-CM | POA: Diagnosis present

## 2021-08-27 LAB — CBC
HCT: 40.3 % (ref 39.0–52.0)
Hemoglobin: 12.6 g/dL — ABNORMAL LOW (ref 13.0–17.0)
MCH: 25.3 pg — ABNORMAL LOW (ref 26.0–34.0)
MCHC: 31.3 g/dL (ref 30.0–36.0)
MCV: 80.8 fL (ref 80.0–100.0)
Platelets: 170 10*3/uL (ref 150–400)
RBC: 4.99 MIL/uL (ref 4.22–5.81)
RDW: 22.8 % — ABNORMAL HIGH (ref 11.5–15.5)
WBC: 6 10*3/uL (ref 4.0–10.5)
nRBC: 0 % (ref 0.0–0.2)

## 2021-08-27 LAB — FERRITIN: Ferritin: 10 ng/mL — ABNORMAL LOW (ref 24–336)

## 2021-08-27 LAB — IRON AND TIBC
Iron: 57 ug/dL (ref 45–182)
Saturation Ratios: 13 % — ABNORMAL LOW (ref 17.9–39.5)
TIBC: 435 ug/dL (ref 250–450)
UIBC: 378 ug/dL

## 2021-08-28 ENCOUNTER — Other Ambulatory Visit: Payer: Self-pay | Admitting: Oncology

## 2021-08-28 DIAGNOSIS — D508 Other iron deficiency anemias: Secondary | ICD-10-CM

## 2021-08-28 DIAGNOSIS — D509 Iron deficiency anemia, unspecified: Secondary | ICD-10-CM | POA: Insufficient documentation

## 2021-09-03 ENCOUNTER — Inpatient Hospital Stay: Payer: Medicare HMO

## 2021-09-03 ENCOUNTER — Inpatient Hospital Stay (HOSPITAL_BASED_OUTPATIENT_CLINIC_OR_DEPARTMENT_OTHER): Payer: Medicare HMO | Admitting: Oncology

## 2021-09-03 ENCOUNTER — Encounter: Payer: Self-pay | Admitting: Oncology

## 2021-09-03 VITALS — BP 105/69 | HR 77 | Temp 98.2°F | Resp 16 | Ht 72.0 in | Wt 202.6 lb

## 2021-09-03 DIAGNOSIS — D509 Iron deficiency anemia, unspecified: Secondary | ICD-10-CM

## 2021-09-03 DIAGNOSIS — D508 Other iron deficiency anemias: Secondary | ICD-10-CM

## 2021-09-03 DIAGNOSIS — D472 Monoclonal gammopathy: Secondary | ICD-10-CM

## 2021-09-03 MED ORDER — SODIUM CHLORIDE 0.9 % IV SOLN
Freq: Once | INTRAVENOUS | Status: AC
  Filled 2021-09-03: qty 250

## 2021-09-03 MED ORDER — IRON SUCROSE 20 MG/ML IV SOLN
200.0000 mg | Freq: Once | INTRAVENOUS | Status: AC
  Administered 2021-09-03: 200 mg via INTRAVENOUS
  Filled 2021-09-03: qty 10

## 2021-09-03 MED ORDER — SODIUM CHLORIDE 0.9 % IV SOLN: 200.0000 mg | INTRAVENOUS | Status: DC

## 2021-09-03 NOTE — Progress Notes (Signed)
? ? ? ?Hematology/Oncology Consult note ?[place]  ?[phone] [phone] ? ?Patient Care Team: ?Derinda Late, MD as PCP - General (Family Medicine) ?Sindy Guadeloupe, MD as Consulting Physician (Hematology and Oncology)  ? ?Name of the patient: Donald Barry  ?[id number]  ?Jun 04, 1945  ? ?Date of visit: 09/03/21 ? ?Diagnosis-IgM MGUS ? ?Chief complaint/ Reason for visit-routine follow-up of MGUS ? ?Heme/Onc history: patient is a 76 year old male with a past medical history of fatty liver and GERD who was seen by pulmonology clinic GI for symptoms of abdominal pain.  As a part of that work-up patient had SPEP done Which showed mildly elevated IgM levels of 202.  IgM monoclonal kappa light chain was noted on immunofixation 0.2 g.  Hence the patient has been referred to [place].  Of note patient had a normal CBC with a white count of 4.1, H&H of 14.8/43.2 in February 2020.  His platelet counts have always been mildly low in the 120s.  CMP was unremarkable.  Iron studies were normal.  Patient also had CT of the abdomen done as a part of microscopic hematuria in February 2020 which did not reveal any evidence of splenomegaly ?  ?Results of blood work from 09/24/2018 were as follows: CBC showed white count of 4.7, H&H of 14.7/43.1.  Count of 147.  CMP was within normal limits.  Multiple myeloma panel showed M protein IgM kappa 0.3 g.  Serum free light chains revealed a mildly elevated kappa free light chain 21.6 with a kappa lambda light chain ratio of 1.86. ?  ? ?Interval history-reports ongoing fatigue.  Denies other complaints.  He is still trying to get his new BiPAP machine ? ?ECOG PS- 1 ?Pain scale- 0 ? ? ?Review of systems- Review of Systems  ?Constitutional:  Positive for malaise/fatigue. Negative for chills, fever and weight loss.  ?HENT:  Negative for congestion, ear discharge and nosebleeds.   ?Eyes:  Negative for blurred vision.  ?Respiratory:  Negative for cough,  hemoptysis, sputum production, shortness of breath and wheezing.   ?Cardiovascular:  Negative for chest pain, palpitations, orthopnea and claudication.  ?Gastrointestinal:  Negative for abdominal pain, blood in stool, constipation, diarrhea, heartburn, melena, nausea and vomiting.  ?Genitourinary:  Negative for dysuria, flank pain, frequency, hematuria and urgency.  ?Musculoskeletal:  Negative for back pain, joint pain and myalgias.  ?Skin:  Negative for rash.  ?Neurological:  Negative for dizziness, tingling, focal weakness, seizures, weakness and headaches.  ?Endo/Heme/Allergies:  Does not bruise/bleed easily.  ?Psychiatric/Behavioral:  Negative for depression and suicidal ideas. The patient does not have insomnia.    ? ? ? ?Allergies  ?Allergen Reactions  ? Tape Dermatitis  ?  Skin blistered- steri strip ?  ? Wound Dressing Adhesive Dermatitis  ?  Skin blistered- steri strip  ? Shellfish Allergy Diarrhea and Nausea And Vomiting  ?  Shrimp ?  ? Shellfish-Derived Products Diarrhea, Nausea And Vomiting and Nausea Only  ? Onion Other (See Comments)  ?  diarrhea ?Diarrhea- verified avoids all onion ingredient (including onion powder). MM, RD 12/19/20  ? Other Nausea And Vomiting  ?  general anesthesia  ? Codeine Nausea Only  ? ? ? ?Past Medical History:  ?Diagnosis Date  ? A-fib ([place])   ? Actinic keratosis   ? Hx of PDT Tx., face 1/252021 and scalp 07/05/2019  ? Allergic rhinitis, seasonal 06/14/2014  ? Allergy   ? Arthritis   ? Chronic neck pain 06/14/2014  ? Complication of anesthesia   ?  Gastroesophageal reflux disease without esophagitis 06/14/2014  ? Headache   ? h/o migraines  ? Hepatic steatosis 04/05/2018  ? Hiatal hernia 09/09/2017  ? Intermittent left lower quadrant abdominal pain 07/15/2017  ? PONV (postoperative nausea and vomiting)   ? Sleep apnea   ? cpap  ? 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 Right vertex scalp. KA-type  ? Squamous cell carcinoma of skin 12/22/2019  ? Left vertex scalp. SCCis  ?  Squamous cell skin cancer 07/2017  ? Resected from scalp.  ? ? ? ?Past Surgical History:  ?Procedure Laterality Date  ? BACK SURGERY  2008  ? L4, L5  ? COLONOSCOPY    ? cystectomy with prostatectomy ileal conduit    ? CYSTO WITH HYDRODISTENSION N/A 06/17/2019  ? Procedure: CYSTOSCOPY/HYDRODISTENSION;  Surgeon: Billey Co, MD;  Location: [facility];  Service: Urology;  Laterality: N/A;  ? CYSTOSCOPY WITH BIOPSY N/A 07/17/2018  ? Procedure: CYSTOSCOPY WITH BLADDER BIOPSY;  Surgeon: Billey Co, MD;  Location: [facility];  Service: Urology;  Laterality: N/A;  ? CYSTOSCOPY WITH BIOPSY N/A 06/17/2019  ? Procedure: CYSTOSCOPY WITH BLADDER  BIOPSY;  Surgeon: Billey Co, MD;  Location: [facility];  Service: Urology;  Laterality: N/A;  ? [place] N/A 07/17/2018  ? Procedure: CYSTOSCOPY WITH FULGERATION;  Surgeon: Billey Co, MD;  Location: [facility];  Service: Urology;  Laterality: N/A;  ? CYSTOSCOPY WITH FULGERATION N/A 06/17/2019  ? Procedure: CYSTOSCOPY WITH FULGERATION;  Surgeon: Billey Co, MD;  Location: [facility];  Service: Urology;  Laterality: N/A;  ? ESOPHAGOGASTRODUODENOSCOPY    ? HERNIA REPAIR  2694  ? umbilical  ? STOMACH SURGERY    ? FUNDIC GLAND POLYP  ? ? ?Social History  ? ?Socioeconomic History  ? Marital status: Married  ?  Spouse name: Not on file  ? Number of children: Not on file  ? Years of education: Not on file  ? Highest education level: Not on file  ?Occupational History  ? Not on file  ?Tobacco Use  ? Smoking status: Never  ? Smokeless tobacco: Never  ?Vaping Use  ? Vaping Use: Never used  ?Substance and Sexual Activity  ? Alcohol use: Never  ? Drug use: Never  ? Sexual activity: Yes  ?  Birth control/protection: None  ?Other Topics Concern  ? Not on file  ?Social History Narrative  ? Not on file  ? ?Social Determinants of Health  ? ?Financial Resource Strain: Not on file  ?Food Insecurity: Not on file  ?Transportation Needs: Not on file  ?Physical Activity:  Not on file  ?Stress: Not on file  ?Social Connections: Not on file  ?Intimate Partner Violence: Not on file  ? ? ?Family History  ?Problem Relation Age of Onset  ? Hypertension Mother   ? Heart disease Father   ? GER disease Sister   ? GER disease Brother   ? Prostate cancer Neg Hx   ? Kidney cancer Neg Hx   ? ? ? ?Current Outpatient Medications:  ?  aspirin EC 81 MG tablet, Take 81 mg by mouth daily. , Disp: , Rfl:  ?  atorvastatin (LIPITOR) 10 MG tablet, Take 10 mg by mouth daily., Disp: , Rfl:  ?  ferrous sulfate 325 (65 FE) MG EC tablet, Take 1 tablet by mouth daily., Disp: , Rfl:  ?  fluticasone (FLONASE) 50 MCG/ACT nasal spray, Place 1 spray into both nostrils daily., Disp: , Rfl:  ?  guaiFENesin (  MUCINEX) 600 MG 12 hr tablet, Take by mouth 2 (two) times daily., Disp: , Rfl:  ?  metoprolol tartrate (LOPRESSOR) 25 MG tablet, Take 0.5 tablets (12.5 mg total) by mouth 2 (two) times daily., Disp: 30 tablet, Rfl: 0 ?  Multiple Vitamin (MULTIVITAMIN WITH MINERALS) TABS tablet, Take 1 tablet by mouth daily. Iron Free, Disp: , Rfl:  ?  NON FORMULARY, Place 1 Dose/kg into the nose daily as needed. Netty pot for sinuses, Disp: , Rfl:  ?  omeprazole (PRILOSEC) 40 MG capsule, Take by mouth., Disp: , Rfl:  ? ?Physical exam:  ?Vitals:  ? 09/03/21 1316  ?BP: 105/69  ?Pulse: 77  ?Resp: 16  ?Temp: 98.2 ?F (36.8 ?C)  ?TempSrc: Oral  ?Weight: 202 lb 9.6 oz (91.9 kg)  ?Height: 6' (1.829 m)  ? ?Physical Exam ?Cardiovascular:  ?   Rate and Rhythm: Normal rate and regular rhythm.  ?   Heart sounds: Normal heart sounds.  ?Pulmonary:  ?   Effort: Pulmonary effort is normal.  ?   Breath sounds: Normal breath sounds.  ?Skin: ?   General: Skin is warm and dry.  ?Neurological:  ?   Mental Status: He is alert and oriented to person, place, and time.  ?  ? ? ?  Latest Ref Rng & Units 06/13/2021  ? 11:00 AM  ?CMP  ?Glucose 70 - 99 mg/dL 100    ?BUN 8 - 23 mg/dL 18    ?Creatinine 0.61 - 1.24 mg/dL 0.82    ?Sodium 135 - 145 mmol/L 140     ?Potassium 3.5 - 5.1 mmol/L 3.8    ?Chloride 98 - 111 mmol/L 107    ?CO2 22 - 32 mmol/L 25    ?Calcium 8.9 - 10.3 mg/dL 9.4    ? ? ?  Latest Ref Rng & Units 08/27/2021  ?  1:42 PM  ?CBC  ?WBC 4.0 - 10.5 K/uL 6.0

## 2021-09-03 NOTE — Progress Notes (Signed)
He his awaiting a bipap, he did had a cpap and had to change and sleeping not as good since he stopped the cpap. Bi pap should be coming soon ?

## 2021-09-03 NOTE — Patient Instructions (Signed)
[facility] [place]  Discharge Instructions: ?Thank you for choosing [facility] to provide your oncology and hematology care.  ?If you have a lab appointment with the Cancer Center, please go directly to the Cancer Center and check in at the registration area. ? ?Wear comfortable clothing and clothing appropriate for easy access to any Portacath or PICC line.  ? ?We strive to give you quality time with your provider. You may need to reschedule your appointment if you arrive late (15 or more minutes).  Arriving late affects you and other patients whose appointments are after yours.  Also, if you miss three or more appointments without notifying the office, you may be dismissed from the clinic at the provider?s discretion.    ?  ?For prescription refill requests, have your pharmacy contact our office and allow 72 hours for refills to be completed.   ? ?Today you received the following chemotherapy and/or immunotherapy agents VENOFER ?    ?  ?To help prevent nausea and vomiting after your treatment, we encourage you to take your nausea medication as directed. ? ?BELOW ARE SYMPTOMS THAT SHOULD BE REPORTED IMMEDIATELY: ?*FEVER GREATER THAN 100.4 F (38 ?C) OR HIGHER ?*CHILLS OR SWEATING ?*NAUSEA AND VOMITING THAT IS NOT CONTROLLED WITH YOUR NAUSEA MEDICATION ?*UNUSUAL SHORTNESS OF BREATH ?*UNUSUAL BRUISING OR BLEEDING ?*URINARY PROBLEMS (pain or burning when urinating, or frequent urination) ?*BOWEL PROBLEMS (unusual diarrhea, constipation, pain near the anus) ?TENDERNESS IN MOUTH AND THROAT WITH OR WITHOUT PRESENCE OF ULCERS (sore throat, sores in mouth, or a toothache) ?UNUSUAL RASH, SWELLING OR PAIN  ?UNUSUAL VAGINAL DISCHARGE OR ITCHING  ? ?Items with * indicate a potential emergency and should be followed up as soon as possible or go to the Emergency Department if any problems should occur. ? ?Please show the CHEMOTHERAPY ALERT CARD or IMMUNOTHERAPY ALERT CARD at check-in to  the Emergency Department and triage nurse. ? ?Should you have questions after your visit or need to cancel or reschedule your appointment, please contact [facility] [place]  [phone] and follow the prompts.  Office hours are 8:00 a.m. to 4:30 p.m. Monday - Friday. Please note that voicemails left after 4:00 p.m. may not be returned until the following business day.  We are closed weekends and major holidays. You have access to a nurse at all times for urgent questions. Please call the main number to the clinic [phone] and follow the prompts. ? ?For any non-urgent questions, you may also contact your provider using MyChart. We now offer e-Visits for anyone 18 and older to request care online for non-urgent symptoms. For details visit [email or web address]. ?  ?Also download the MyChart app! Go to the app store, search "MyChart", open the app, select [place], and log in with your MyChart username and password. ? ?Due to Covid, a mask is required upon entering the hospital/clinic. If you do not have a mask, one will be given to you upon arrival. For doctor visits, patients may have 1 support person aged 18 or older with them. For treatment visits, patients cannot have anyone with them due to current Covid guidelines and our immunocompromised population.  ? ?Iron Sucrose Injection ?What is this medication? ?IRON SUCROSE (EYE ern SOO krose) treats low levels of iron (iron deficiency anemia) in people with kidney disease. Iron is a mineral that plays an important role in making red blood cells, which carry oxygen from your lungs to the rest of your body. ?This medicine   may be used for other purposes; ask your health care provider or pharmacist if you have questions. ?COMMON BRAND NAME(S): Venofer ?What should I tell my care team before I take this medication? ?They need to know if you have any of these conditions: ?Anemia not caused by low iron levels ?Heart disease ?High levels of  iron in the blood ?Kidney disease ?Liver disease ?An unusual or allergic reaction to iron, other medications, foods, dyes, or preservatives ?Pregnant or trying to get pregnant ?Breast-feeding ?How should I use this medication? ?This medication is for infusion into a vein. It is given in a hospital or clinic setting. ?Talk to your care team about the use of this medication in children. While this medication may be prescribed for children as young as 2 years for selected conditions, precautions do apply. ?Overdosage: If you think you have taken too much of this medicine contact a poison control center or emergency room at once. ?NOTE: This medicine is only for you. Do not share this medicine with others. ?What if I miss a dose? ?It is important not to miss your dose. Call your care team if you are unable to keep an appointment. ?What may interact with this medication? ?Do not take this medication with any of the following: ?Deferoxamine ?Dimercaprol ?Other iron products ?This medication may also interact with the following: ?Chloramphenicol ?Deferasirox ?This list may not describe all possible interactions. Give your health care provider a list of all the medicines, herbs, non-prescription drugs, or dietary supplements you use. Also tell them if you smoke, drink alcohol, or use illegal drugs. Some items may interact with your medicine. ?What should I watch for while using this medication? ?Visit your care team regularly. Tell your care team if your symptoms do not start to get better or if they get worse. You may need blood work done while you are taking this medication. ?You may need to follow a special diet. Talk to your care team. Foods that contain iron include: whole grains/cereals, dried fruits, beans, or peas, leafy green vegetables, and organ meats (liver, kidney). ?What side effects may I notice from receiving this medication? ?Side effects that you should report to your care team as soon as  possible: ?Allergic reactions--skin rash, itching, hives, swelling of the face, lips, tongue, or throat ?Low blood pressure--dizziness, feeling faint or lightheaded, blurry vision ?Shortness of breath ?Side effects that usually do not require medical attention (report to your care team if they continue or are bothersome): ?Flushing ?Headache ?Joint pain ?Muscle pain ?Nausea ?Pain, redness, or irritation at injection site ?This list may not describe all possible side effects. Call your doctor for medical advice about side effects. You may report side effects to FDA at 1-800-FDA-1088. ?Where should I keep my medication? ?This medication is given in a hospital or clinic and will not be stored at home. ?NOTE: This sheet is a summary. It may not cover all possible information. If you have questions about this medicine, talk to your doctor, pharmacist, or health care provider. ?? 2023 Elsevier/Gold Standard (2020-09-22 00:00:00) ? ?

## 2021-09-06 ENCOUNTER — Inpatient Hospital Stay: Payer: Medicare HMO

## 2021-09-06 VITALS — BP 114/75 | HR 82 | Temp 98.0°F | Resp 18

## 2021-09-06 DIAGNOSIS — D472 Monoclonal gammopathy: Secondary | ICD-10-CM | POA: Diagnosis not present

## 2021-09-06 DIAGNOSIS — D508 Other iron deficiency anemias: Secondary | ICD-10-CM

## 2021-09-06 MED ORDER — IRON SUCROSE 20 MG/ML IV SOLN
200.0000 mg | Freq: Once | INTRAVENOUS | Status: AC
  Administered 2021-09-06: 200 mg via INTRAVENOUS
  Filled 2021-09-06: qty 10

## 2021-09-06 MED ORDER — SODIUM CHLORIDE 0.9 % IV SOLN
Freq: Once | INTRAVENOUS | Status: AC
  Filled 2021-09-06: qty 250

## 2021-09-06 MED ORDER — SODIUM CHLORIDE 0.9 % IV SOLN: 200.0000 mg | INTRAVENOUS | Status: DC

## 2021-09-06 NOTE — Patient Instructions (Signed)
[facility] [place]  Discharge Instructions: ?Thank you for choosing [facility] to provide your oncology and hematology care.  ?If you have a lab appointment with the [facility], please go directly to the [facility] and check in at the registration area. ? ?Wear comfortable clothing and clothing appropriate for easy access to any Portacath or PICC line.  ? ?We strive to give you quality time with your provider. You may need to reschedule your appointment if you arrive late (15 or more minutes).  Arriving late affects you and other patients whose appointments are after yours.  Also, if you miss three or more appointments without notifying the office, you may be dismissed from the clinic at the provider?s discretion.    ?  ?For prescription refill requests, have your pharmacy contact our office and allow 72 hours for refills to be completed.   ? ?Today you received the following chemotherapy and/or immunotherapy agents VENOFER    ?  ?To help prevent nausea and vomiting after your treatment, we encourage you to take your nausea medication as directed. ? ?BELOW ARE SYMPTOMS THAT SHOULD BE REPORTED IMMEDIATELY: ?*FEVER GREATER THAN 100.4 F (38 ?C) OR HIGHER ?*CHILLS OR SWEATING ?*NAUSEA AND VOMITING THAT IS NOT CONTROLLED WITH YOUR NAUSEA MEDICATION ?*UNUSUAL SHORTNESS OF BREATH ?*UNUSUAL BRUISING OR BLEEDING ?*URINARY PROBLEMS (pain or burning when urinating, or frequent urination) ?*BOWEL PROBLEMS (unusual diarrhea, constipation, pain near the anus) ?TENDERNESS IN MOUTH AND THROAT WITH OR WITHOUT PRESENCE OF ULCERS (sore throat, sores in mouth, or a toothache) ?UNUSUAL RASH, SWELLING OR PAIN  ?UNUSUAL VAGINAL DISCHARGE OR ITCHING  ? ?Items with * indicate a potential emergency and should be followed up as soon as possible or go to the Emergency Department if any problems should occur. ? ?Please show the CHEMOTHERAPY ALERT CARD or IMMUNOTHERAPY ALERT CARD at check-in to the  Emergency Department and triage nurse. ? ?Should you have questions after your visit or need to cancel or reschedule your appointment, please contact [facility] [place] AT [place]  [phone] and follow the prompts.  Office hours are 8:00 a.m. to 4:30 p.m. Monday - Friday. Please note that voicemails left after 4:00 p.m. may not be returned until the following business day.  We are closed weekends and major holidays. You have access to a nurse at all times for urgent questions. Please call the main number to the clinic [phone] and follow the prompts. ? ?For any non-urgent questions, you may also contact your provider using MyChart. We now offer e-Visits for anyone 76 and older to request care online for non-urgent symptoms. For details visit [email or web address]. ?  ?Also download the MyChart app! Go to the app store, search "MyChart", open the app, select [facility], and log in with your MyChart username and password. ? ?Due to Covid, a mask is required upon entering the hospital/clinic. If you do not have a mask, one will be given to you upon arrival. For doctor visits, patients may have 1 support person aged 71 or older with them. For treatment visits, patients cannot have anyone with them due to current Covid guidelines and our immunocompromised population.  ? ? ?

## 2021-09-10 ENCOUNTER — Inpatient Hospital Stay: Payer: Medicare HMO | Attending: Oncology

## 2021-09-10 VITALS — BP 121/74 | HR 66 | Temp 97.0°F | Resp 19

## 2021-09-10 DIAGNOSIS — Z79899 Other long term (current) drug therapy: Secondary | ICD-10-CM | POA: Insufficient documentation

## 2021-09-10 DIAGNOSIS — D509 Iron deficiency anemia, unspecified: Secondary | ICD-10-CM | POA: Insufficient documentation

## 2021-09-10 DIAGNOSIS — D472 Monoclonal gammopathy: Secondary | ICD-10-CM | POA: Diagnosis present

## 2021-09-10 DIAGNOSIS — D508 Other iron deficiency anemias: Secondary | ICD-10-CM

## 2021-09-10 MED ORDER — IRON SUCROSE 20 MG/ML IV SOLN
200.0000 mg | Freq: Once | INTRAVENOUS | Status: AC
  Administered 2021-09-10: 200 mg via INTRAVENOUS
  Filled 2021-09-10: qty 10

## 2021-09-10 MED ORDER — SODIUM CHLORIDE 0.9 % IV SOLN
Freq: Once | INTRAVENOUS | Status: AC
  Filled 2021-09-10: qty 250

## 2021-09-10 MED ORDER — SODIUM CHLORIDE 0.9 % IV SOLN: 200.0000 mg | INTRAVENOUS | Status: DC

## 2021-09-10 NOTE — Patient Instructions (Signed)

## 2021-09-13 ENCOUNTER — Inpatient Hospital Stay: Payer: Medicare HMO

## 2021-09-13 VITALS — BP 118/74 | HR 70 | Temp 98.3°F | Resp 18

## 2021-09-13 DIAGNOSIS — D508 Other iron deficiency anemias: Secondary | ICD-10-CM

## 2021-09-13 DIAGNOSIS — D472 Monoclonal gammopathy: Secondary | ICD-10-CM | POA: Diagnosis not present

## 2021-09-13 MED ORDER — SODIUM CHLORIDE 0.9 % IV SOLN: 200.0000 mg | INTRAVENOUS | Status: DC

## 2021-09-13 MED ORDER — IRON SUCROSE 20 MG/ML IV SOLN
200.0000 mg | Freq: Once | INTRAVENOUS | Status: AC
  Administered 2021-09-13: 200 mg via INTRAVENOUS
  Filled 2021-09-13: qty 10

## 2021-09-13 MED ORDER — SODIUM CHLORIDE 0.9 % IV SOLN
Freq: Once | INTRAVENOUS | Status: AC
  Filled 2021-09-13: qty 250

## 2021-09-13 NOTE — Patient Instructions (Signed)

## 2021-09-20 ENCOUNTER — Inpatient Hospital Stay: Payer: Medicare HMO

## 2021-09-20 VITALS — BP 127/78 | HR 77 | Temp 97.5°F

## 2021-09-20 DIAGNOSIS — D472 Monoclonal gammopathy: Secondary | ICD-10-CM | POA: Diagnosis not present

## 2021-09-20 DIAGNOSIS — D508 Other iron deficiency anemias: Secondary | ICD-10-CM

## 2021-09-20 MED ORDER — SODIUM CHLORIDE 0.9 % IV SOLN
Freq: Once | INTRAVENOUS | Status: AC
  Filled 2021-09-20: qty 250

## 2021-09-20 MED ORDER — IRON SUCROSE 20 MG/ML IV SOLN
200.0000 mg | Freq: Once | INTRAVENOUS | Status: AC
  Administered 2021-09-20: 200 mg via INTRAVENOUS
  Filled 2021-09-20: qty 10

## 2021-09-20 MED ORDER — SODIUM CHLORIDE 0.9 % IV SOLN: 200.0000 mg | INTRAVENOUS | Status: DC

## 2021-12-02 ENCOUNTER — Other Ambulatory Visit: Payer: Self-pay | Admitting: *Deleted

## 2021-12-02 DIAGNOSIS — D472 Monoclonal gammopathy: Secondary | ICD-10-CM

## 2021-12-02 DIAGNOSIS — D509 Iron deficiency anemia, unspecified: Secondary | ICD-10-CM

## 2021-12-02 DIAGNOSIS — D508 Other iron deficiency anemias: Secondary | ICD-10-CM

## 2021-12-03 ENCOUNTER — Inpatient Hospital Stay: Payer: Medicare HMO | Attending: Oncology

## 2021-12-03 DIAGNOSIS — D472 Monoclonal gammopathy: Secondary | ICD-10-CM | POA: Insufficient documentation

## 2021-12-03 DIAGNOSIS — D509 Iron deficiency anemia, unspecified: Secondary | ICD-10-CM | POA: Diagnosis not present

## 2021-12-03 LAB — CBC
HCT: 39.9 % (ref 39.0–52.0)
Hemoglobin: 13.2 g/dL (ref 13.0–17.0)
MCH: 29.9 pg (ref 26.0–34.0)
MCHC: 33.1 g/dL (ref 30.0–36.0)
MCV: 90.3 fL (ref 80.0–100.0)
Platelets: 203 10*3/uL (ref 150–400)
RBC: 4.42 MIL/uL (ref 4.22–5.81)
RDW: 15.8 % — ABNORMAL HIGH (ref 11.5–15.5)
WBC: 7.4 10*3/uL (ref 4.0–10.5)
nRBC: 0 % (ref 0.0–0.2)

## 2021-12-03 LAB — IRON AND TIBC
Iron: 65 ug/dL (ref 45–182)
Saturation Ratios: 21 % (ref 17.9–39.5)
TIBC: 309 ug/dL (ref 250–450)
UIBC: 244 ug/dL

## 2021-12-03 LAB — FERRITIN: Ferritin: 146 ng/mL (ref 24–336)

## 2021-12-04 LAB — KAPPA/LAMBDA LIGHT CHAINS
Kappa free light chain: 40.3 mg/L — ABNORMAL HIGH (ref 3.3–19.4)
Kappa, lambda light chain ratio: 1.98 — ABNORMAL HIGH (ref 0.26–1.65)
Lambda free light chains: 20.4 mg/L (ref 5.7–26.3)

## 2021-12-05 ENCOUNTER — Other Ambulatory Visit: Payer: Self-pay | Admitting: Oncology

## 2021-12-07 LAB — MULTIPLE MYELOMA PANEL, SERUM
Albumin SerPl Elph-Mcnc: 3.3 g/dL (ref 2.9–4.4)
Albumin/Glob SerPl: 1.1 (ref 0.7–1.7)
Alpha 1: 0.3 g/dL (ref 0.0–0.4)
Alpha2 Glob SerPl Elph-Mcnc: 0.9 g/dL (ref 0.4–1.0)
B-Globulin SerPl Elph-Mcnc: 0.9 g/dL (ref 0.7–1.3)
Gamma Glob SerPl Elph-Mcnc: 1 g/dL (ref 0.4–1.8)
Globulin, Total: 3.1 g/dL (ref 2.2–3.9)
IgA: 135 mg/dL (ref 61–437)
IgG (Immunoglobin G), Serum: 851 mg/dL (ref 603–1613)
IgM (Immunoglobulin M), Srm: 203 mg/dL — ABNORMAL HIGH (ref 15–143)
M Protein SerPl Elph-Mcnc: 0.3 g/dL — ABNORMAL HIGH
Total Protein ELP: 6.4 g/dL (ref 6.0–8.5)

## 2021-12-13 ENCOUNTER — Telehealth: Payer: Self-pay | Admitting: *Deleted

## 2021-12-13 NOTE — Telephone Encounter (Signed)
Labs from 7/24 show normal hb and normal iron M protein is stable. Nothing overtly concerning

## 2021-12-13 NOTE — Telephone Encounter (Signed)
Patient called asking about lab results from 7/24 and the fact that he has to have repeat labs in October, He is asking if it is because of the results of his 7/24 labs Please advise  CBC Order: [id number] Status: Final result    Visible to patient: Yes (seen)    Next appt: 03/06/2022 at 01:00 PM in Oncology ([facility])    Dx: MGUS (monoclonal gammopathy of unknow...    0 Result Notes           Component Ref Range & Units 10 d ago (12/03/21) 3 mo ago (08/27/21) 6 mo ago (06/13/21) 6 mo ago (05/22/21) 11 mo ago (01/14/21) 11 mo ago (01/13/21) 11 mo ago (01/12/21)  WBC 4.0 - 10.5 K/uL 7.4  6.0  5.9  6.0  5.1  5.3  5.9   RBC 4.22 - 5.81 MIL/uL 4.42  4.99  5.25  4.85  2.95 Low   2.96 Low   2.91 Low    Hemoglobin 13.0 - 17.0 g/dL 13.2  12.6 Low   11.8 Low   10.7 Low  CM  8.4 Low   8.6 Low   8.3 Low  CM   HCT 39.0 - 52.0 % 39.9  40.3  39.1  35.2 Low   26.8 Low   27.0 Low   27.0 Low    MCV 80.0 - 100.0 fL 90.3  80.8  74.5 Low   72.6 Low   90.8  91.2  92.8   MCH 26.0 - 34.0 pg 29.9  25.3 Low   22.5 Low   22.1 Low   28.5  29.1  28.5   MCHC 30.0 - 36.0 g/dL 33.1  31.3  30.2  30.4  31.3  31.9  30.7   RDW 11.5 - 15.5 % 15.8 High   22.8 High   21.2 High   21.3 High   15.0  15.0  15.5   Platelets 150 - 400 K/uL 203  170  208  211  161  155  169 CM   nRBC 0.0 - 0.2 % 0.0  0.0 CM  0.0 CM  0.0  0.0 CM  0.0 CM  0.0 CM   Comment: Performed at [facility], [place], [place] [address]  Neutrophils Relative %     72 R      Basophils Absolute     0.0 R      Immature Granulocytes     0 R      Abs Immature Granulocytes     0.02 R, CM      Neutro Abs     4.3 R      Lymphocytes Relative     17 R      Lymphs Abs     1.1 R      Monocytes Relative     10 R      Monocytes Absolute     0.6 R      Eosinophils Relative     1 R      Eosinophils Absolute     0.1 R      Basophils Relative     0 R      Resulting Agency  [facility] [place] [facility] [place] [facility] [place] [facility] [place] [facility] [place] [facility] [place] [facility]  [facility]         Specimen Collected: 12/03/21 14:14 Last Resulted: 12/03/21 14:29      Lab Flowsheet    Order  Details    View Encounter    Lab and Collection Details    Routing    Result History    View All Conversations on this Encounter      CM=Additional comments  R=Reference range differs from displayed range      Result Care Coordination   Patient Communication   Add Comments   Seen Back to Top       Other Results from 12/03/2021   Contains abnormal data Multiple Myeloma Panel (SPEP&IFE w/QIG) Order: [id number] Status: Edited Result - FINAL    Visible to patient: Yes (seen)    Next appt: 03/06/2022 at 01:00 PM in Oncology ([facility])    Dx: MGUS (monoclonal gammopathy of unknow...    0 Result Notes         Component Ref Range & Units 10 d ago 6 mo ago 1 yr ago 2 yr ago 3 yr ago  IgG (Immunoglobin G), Serum 603 - 1,613 mg/dL 851  735  648  599 Low   681   IgA 61 - 437 mg/dL 135  113  116  97  111   IgM (Immunoglobulin M), Srm 15 - 143 mg/dL 203 High   195 High   185 High   168 High   198 High    Total Protein ELP 6.0 - 8.5 g/dL 6.4 VC  5.9 Low  VC  6.4 VC  5.8 Low  VC  6.2 VC   Albumin SerPl Elph-Mcnc 2.9 - 4.4 g/dL 3.3 VC  3.2 VC  3.6 VC  3.4 VC  3.7 VC   Alpha 1 0.0 - 0.4 g/dL 0.3 VC  0.2 VC  0.2 VC  0.2 VC  0.2 VC   Alpha2 Glob SerPl Elph-Mcnc 0.4 - 1.0 g/dL 0.9 VC  0.7 VC  0.8 VC  0.7 VC  0.7 VC   B-Globulin SerPl Elph-Mcnc 0.7 - 1.3 g/dL 0.9 VC  0.9 VC  1.0 VC  0.8 VC  0.9 VC   Gamma Glob SerPl Elph-Mcnc 0.4 - 1.8 g/dL 1.0 VC  0.8 VC  0.7 VC  0.7 VC  0.7 VC   M Protein SerPl Elph-Mcnc Not Observed g/dL 0.3 High  VC  0.2 High  VC  0.1 High  VC  0.2 High  VC  0.3 High  VC   Comment: An additional m  spike was observed at a concentration of 0.3 g/dl   Globulin, Total 2.2 - 3.9 g/dL 3.1 VC  2.7 VC  2.8 VC  2.4 VC  2.5 VC   Albumin/Glob SerPl 0.7 - 1.7 1.1 VC  1.2 VC  1.3 VC  1.5 VC  1.5 VC   IFE 1  Comment Abnormal  VC  Comment Abnormal  VC, CM  Comment Abnormal   VC, CM  Comment Abnormal  VC, CM  Comment VC, CM   Comment: (NOTE)  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   Please Note  Comment VC  Comment VC, CM  Comment VC, CM  Comment VC, CM  Comment VC, CM   Comment: (NOTE)  Protein electrophoresis scan will follow via computer, mail, or  courier delivery.  Performed At: [facility]  [address] [place], [place] [id number]  Rush Farmer MD MV:[id number]   Resulting Agency  [place] [facility] [place] [facility] [place] [facility] [place] [facility] [place] [facility]         Specimen Collected:  12/03/21 14:14 Last Resulted: 12/07/21 16:37      Lab Flowsheet    Order Details    View Encounter    Lab and Collection Details    Routing    Result History    View All Conversations on this Encounter      VC=Value has a corrected status   CM=Additional comments      Result Care Coordination   Patient Communication   Add Comments   Seen Back to Top          Contains abnormal data Kappa/lambda light chains Order: [id number] Status: Final result    Visible to patient: Yes (seen)    Next appt: 03/06/2022 at 01:00 PM in Oncology ([facility])    Dx: MGUS (monoclonal gammopathy of unknow...    0 Result Notes         Component Ref Range & Units 10 d ago 6 mo ago 1 yr ago 2 yr ago 3 yr ago  Kappa free light chain 3.3 - 19.4 mg/L 40.3 High   34.6 High   28.2 High   23.6 High   21.6 High    Lambda free light chains 5.7 - 26.3 mg/L 20.4  17.9  16.1  12.7  11.6   Kappa, lambda light chain ratio 0.26 - 1.65 1.98 High   1.93 High  CM  1.75 High  CM  1.86 High  CM  1.86 High  CM   Comment: (NOTE)  Performed At: [facility]  [address] [place], [place] [id number]  Rush Farmer MD EU:[id number]   Resulting Agency  [place] [facility] [place] [facility] [place] [facility] [place] [facility]         Specimen Collected: 12/03/21 14:14 Last Resulted: 12/04/21 14:47      Lab Flowsheet     Order Details    View Encounter    Lab and Collection Details    Routing    Result History    View All Conversations on this Encounter      CM=Additional comments      Result Care Coordination   Patient Communication   Add Comments   Seen Back to Top         Iron and TIBC Order: [id number] Status: Final result    Visible to patient: Yes (seen)    Next appt: 03/06/2022 at 01:00 PM in Oncology ([facility])    Dx: MGUS (monoclonal gammopathy of unknow...    0 Result Notes        Component Ref Range & Units 10 d ago 3 mo ago 6 mo ago 11 mo ago  Iron 45 - 182 ug/dL 65  57  29 Low   8 Low    TIBC 250 - 450 ug/dL 309  435  398  267   Saturation Ratios 17.9 - 39.5 % 21  13 Low   7 Low   3 Low    UIBC ug/dL 244  378 CM  369 CM  259 CM   Comment: Performed at [facility], [address] [place], [place] [address]  Resulting Agency  [facility] LAB [facility] LAB         Specimen Collected: 12/03/21 14:14 Last Resulted: 12/03/21 15:09      Lab Flowsheet    Order Details    View Encounter    Lab and Collection Details    Routing    Result History  View All Conversations on this Encounter      CM=Additional comments      Result Care Coordination   Patient Communication   Add Comments   Seen Back to Top         Ferritin Order: [id number] Status: Final result    Visible to patient: Yes (seen)    Next appt: 03/06/2022 at 01:00 PM in Oncology ([facility])    Dx: MGUS (monoclonal gammopathy of unknow...    0 Result Notes       Component Ref Range & Units 10 d ago 3 mo ago 6 mo ago  Ferritin 24 - 336 ng/mL 146  10 Low  CM  6 Low  CM   Comment: Performed at [facility], [address] [place], [place] [address]  Resulting Agency  [facility] [place] [facility] [place] [facility]

## 2021-12-14 ENCOUNTER — Other Ambulatory Visit: Payer: Self-pay | Admitting: *Deleted

## 2021-12-14 ENCOUNTER — Encounter: Payer: Self-pay | Admitting: Oncology

## 2021-12-14 NOTE — Telephone Encounter (Signed)
error 

## 2021-12-14 NOTE — Telephone Encounter (Signed)
Pt is aware and understands of normal iron, and M protein being stable.

## 2021-12-31 DIAGNOSIS — N5089 Other specified disorders of the male genital organs: Secondary | ICD-10-CM | POA: Insufficient documentation

## 2022-01-27 ENCOUNTER — Ambulatory Visit
Admission: EM | Admit: 2022-01-27 | Discharge: 2022-01-27 | Disposition: A | Discharge status: Home / Self Care | Payer: Managed Care, Other (non HMO)

## 2022-01-27 ENCOUNTER — Encounter: Payer: Self-pay | Admitting: Oncology

## 2022-01-27 DIAGNOSIS — U071 COVID-19: Secondary | ICD-10-CM | POA: Insufficient documentation

## 2022-01-27 DIAGNOSIS — J069 Acute upper respiratory infection, unspecified: Secondary | ICD-10-CM | POA: Diagnosis not present

## 2022-01-27 LAB — RESP PANEL BY RT-PCR (RSV, FLU A&B, COVID)  RVPGX2
Influenza A by PCR: NEGATIVE
Influenza B by PCR: NEGATIVE
Resp Syncytial Virus by PCR: NEGATIVE
SARS Coronavirus 2 by RT PCR: POSITIVE — AB

## 2022-01-27 NOTE — Discharge Instructions (Addendum)
You were swabbed for viral respiratory pathogens today.  You will find the results in your MyChart account.  If positive, you will be contacted to discuss any possible treatments.  Follow-up here or with your primary care provider if symptoms do not resolve within 1 week.

## 2022-01-27 NOTE — ED Provider Notes (Signed)
Donald Barry    CSN: [id number] Arrival date & time: 01/27/22  4098      History   Chief Complaint Chief Complaint  Patient presents with   Cough   Nasal Congestion    HPI Donald Barry is a 76 y.o. male.    Cough   Patient is accompanied by his wife who reports similar symptoms.   Presents to urgent care with symptoms x2 days including:  nasal congestion, cough with some productivity (milky, airy looking), chills, fever tmax 101.x, body aches. Some frontal pain/pressure.  PMH significant for a-fib. Denies COPD.  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 SCCis   Squamous cell skin cancer 07/2017   Resected from scalp.    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/2019   Interstitial cystitis 03/04/2019   Obstructive sleep apnea 12/29/2018   Thrombocytopenia  ([place]) 09/30/2018   Aortic atherosclerosis ([place]) 07/13/2018   Hepatic steatosis 04/05/2018   Hiatal hernia 09/09/2017   Hx of adenomatous colonic polyps 07/15/2017   Intermittent left lower quadrant abdominal pain 07/15/2017   Allergic rhinitis, seasonal 06/14/2014   Chronic neck pain 06/14/2014   Gastroesophageal reflux disease without esophagitis 06/14/2014    Past Surgical History:  Procedure Laterality Date   BACK SURGERY  2008   L4, L5   COLONOSCOPY     cystectomy with prostatectomy ileal conduit     CYSTO WITH HYDRODISTENSION N/A 06/17/2019   Procedure: CYSTOSCOPY/HYDRODISTENSION;  Surgeon: Billey Co, MD;  Location: [facility] ORS;  Service: Urology;  Laterality: N/A;   CYSTOSCOPY WITH BIOPSY N/A 07/17/2018   Procedure: CYSTOSCOPY WITH BLADDER BIOPSY;  Surgeon: Billey Co, MD;  Location: [facility] ORS;  Service: Urology;  Laterality: N/A;   CYSTOSCOPY WITH BIOPSY N/A 06/17/2019   Procedure: CYSTOSCOPY WITH BLADDER  BIOPSY;  Surgeon: Billey Co, MD;  Location: [facility] OR[facility];  Service: Urology;  Laterality: N/A;   CYSTOSCOPY WITH FULGERATION N/A 07/17/2018   Procedure: CYSTOSCOPY WITH FULGERATION;  Surgeon: Billey Co, MD;  Location: [facility] OR[facility];  Service: Urology;  Laterality: N/A;   CYSTOSCOPY WITH FULGERATION N/A 06/17/2019   Procedure: CYSTOSCOPY WITH FULGERATION;  Surgeon: Billey Co, MD;  Location: [facility] OR[facility];  Service: Urology;  Laterality: N/A;   ESOPHAGOGASTRODUODENOSCOPY     HERNIA REPAIR  2012  umbilical   STOMACH SURGERY     FUNDIC GLAND POLYP       Home Medications    Prior to Admission medications   Medication Sig Start Date End Date Taking? Authorizing Provider  atorvastatin (LIPITOR) 10 MG tablet Take 1 tablet by mouth daily. 08/06/19  Yes [provider]  Coenzyme Q10 (COQ10) 100 MG CAPS Take by mouth.   Yes [provider]  omeprazole (PRILOSEC) 40 MG capsule Take 1 capsule by mouth daily. 10/25/19  Yes [provider]  traZODone (DESYREL) 50 MG tablet  12/20/21  Yes [provider]  aspirin EC 81 MG tablet Take 81 mg by mouth daily.     [provider]  aspirin EC 81 MG tablet Take 1 tablet by mouth daily.    [provider]  atorvastatin (LIPITOR) 10 MG tablet Take 10 mg by mouth daily. 08/06/19   [provider]  ferrous sulfate 325 (65 FE) MG EC tablet TAKE 1 TABLET BY MOUTH EVERY DAY 12/05/21   Sindy Guadeloupe, MD  fluticasone (FLONASE) 50 MCG/ACT nasal spray Place 1 spray into both nostrils daily.    [provider]  fluticasone (FLONASE) 50 MCG/ACT nasal spray Place into the nose.    [provider]  guaiFENesin (MUCINEX) 600 MG 12 hr tablet Take by mouth 2 (two) times daily.    [provider]  ibuprofen (ADVIL) 200 MG tablet Take by mouth.    [provider]  metoprolol tartrate (LOPRESSOR) 25 MG tablet Take 0.5 tablets (12.5 mg total) by mouth 2 (two) times daily. 06/13/21 09/03/21  Delman Kitten, MD  Multiple Vitamin (MULTIVITAMIN WITH MINERALS) TABS tablet Take 1 tablet by mouth daily. Iron Free    [provider]  NON FORMULARY Place 1 Dose/kg into the nose daily as needed. Netty pot for sinuses    [provider]  omeprazole (PRILOSEC) 40 MG capsule Take by mouth. 02/13/21   [provider]    Family History Family History  Problem Relation Age of Onset   Hypertension Mother    Heart disease Father    GER disease Sister    GER disease Brother    Prostate cancer Neg Hx    Kidney cancer Neg Hx     Social History Social History   Tobacco Use   Smoking status: Never   Smokeless tobacco: Never  Vaping Use   Vaping Use: Never used  Substance Use Topics   Alcohol use: Never   Drug use: Never     Allergies   Tape, Wound dressing adhesive, Shellfish allergy, Shellfish-derived products, Onion, Other, and Codeine   Review of Systems Review of Systems   Physical Exam Triage Vital Signs ED  Triage Vitals  Enc Vitals Group     BP 01/27/22 1004 103/70     Pulse Rate 01/27/22 1004 72     Resp 01/27/22 1004 16     Temp 01/27/22 1004 98.4 F (36.9 C)     Temp Source 01/27/22 1004 Oral     SpO2 01/27/22 1004 96 %     Weight --      Height --      Head Circumference --      Peak Flow --      Pain Score 01/27/22 1000 0     Pain Loc --      Pain Edu? --      Excl. in [place]? --    No data found.  Updated  Vital Signs BP 103/70 (BP Location: Left Arm)   Pulse 72   Temp 98.4 F (36.9 C) (Oral)   Resp 16   SpO2 96%   Visual Acuity Right Eye Distance:   Left Eye Distance:   Bilateral Distance:    Right Eye Near:   Left Eye Near:    Bilateral Near:     Physical Exam Vitals reviewed.  Constitutional:      Appearance: Normal appearance. He is not ill-appearing.  HENT:     Head: Normocephalic and atraumatic.     Nose: Congestion present.     Mouth/Throat:     Mouth: Mucous membranes are moist.     Pharynx: No oropharyngeal exudate or posterior oropharyngeal erythema.  Eyes:     Conjunctiva/sclera: Conjunctivae normal.     Pupils: Pupils are equal, round, and reactive to light.  Cardiovascular:     Rate and Rhythm: Normal rate and regular rhythm.     Pulses: Normal pulses.     Heart sounds: Normal heart sounds.  Pulmonary:     Effort: Pulmonary effort is normal.     Breath sounds: Normal breath sounds.  Skin:    General: Skin is warm and dry.  Neurological:     General: No focal deficit present.     Mental Status: He is alert and oriented to person, place, and time.  Psychiatric:        Mood and Affect: Mood normal.        Behavior: Behavior normal.      UC Treatments / Results  Labs (all labs ordered are listed, but only abnormal results are displayed) Labs Reviewed - No data to display  EKG   Radiology No results found.  Procedures Procedures (including critical care time)  Medications Ordered in UC Medications - No data to  display  Initial Impression / Assessment and Plan / UC Course  I have reviewed the triage vital signs and the nursing notes.  Pertinent labs & imaging results that were available during my care of the patient were reviewed by me and considered in my medical decision making (see chart for details).   Exam is unremarkable.  Lungs CTAB.  Viral URI is suspected.  Respiratory swab obtained for COVID/flu/RSV.  Use OTC medication symptom control.   Final Clinical Impressions(s) / UC Diagnoses   Final diagnoses:  None   Discharge Instructions   None    ED Prescriptions   None    PDMP not reviewed this encounter.   Rose Phi, [place] 01/27/22 1051

## 2022-01-27 NOTE — ED Triage Notes (Signed)
Pt. States that since Friday he has been having nasal congestion and a constant cough. Pt. Has been treating himself w/ tylenol.

## 2022-02-05 ENCOUNTER — Emergency Department: Payer: Managed Care, Other (non HMO)

## 2022-02-05 ENCOUNTER — Telehealth: Payer: Self-pay | Admitting: Nurse Practitioner

## 2022-02-05 ENCOUNTER — Emergency Department
Admission: EM | Admit: 2022-02-05 | Discharge: 2022-02-06 | Disposition: A | Discharge status: Home / Self Care | Payer: Managed Care, Other (non HMO) | Attending: Emergency Medicine | Admitting: Emergency Medicine

## 2022-02-05 ENCOUNTER — Encounter: Payer: Self-pay | Admitting: Oncology

## 2022-02-05 DIAGNOSIS — R002 Palpitations: Secondary | ICD-10-CM | POA: Diagnosis present

## 2022-02-05 DIAGNOSIS — R0602 Shortness of breath: Secondary | ICD-10-CM | POA: Insufficient documentation

## 2022-02-05 DIAGNOSIS — U071 COVID-19: Secondary | ICD-10-CM | POA: Insufficient documentation

## 2022-02-05 DIAGNOSIS — Z79899 Other long term (current) drug therapy: Secondary | ICD-10-CM | POA: Diagnosis not present

## 2022-02-05 DIAGNOSIS — I480 Paroxysmal atrial fibrillation: Secondary | ICD-10-CM | POA: Diagnosis not present

## 2022-02-05 LAB — BRAIN NATRIURETIC PEPTIDE: B Natriuretic Peptide: 339.7 pg/mL — ABNORMAL HIGH (ref 0.0–100.0)

## 2022-02-05 LAB — BASIC METABOLIC PANEL
Anion gap: 11 (ref 5–15)
BUN: 20 mg/dL (ref 8–23)
CO2: 21 mmol/L — ABNORMAL LOW (ref 22–32)
Calcium: 9.3 mg/dL (ref 8.9–10.3)
Chloride: 108 mmol/L (ref 98–111)
Creatinine, Ser: 1.23 mg/dL (ref 0.61–1.24)
GFR, Estimated: >60 mL/min (ref >60)
Glucose, Bld: 125 mg/dL — ABNORMAL HIGH (ref 70–99)
Potassium: 4.4 mmol/L (ref 3.5–5.1)
Sodium: 140 mmol/L (ref 135–145)

## 2022-02-05 LAB — CBC
HCT: 43.8 % (ref 39.0–52.0)
Hemoglobin: 14.4 g/dL (ref 13.0–17.0)
MCH: 29.8 pg (ref 26.0–34.0)
MCHC: 32.9 g/dL (ref 30.0–36.0)
MCV: 90.7 fL (ref 80.0–100.0)
Platelets: 255 10*3/uL (ref 150–400)
RBC: 4.83 MIL/uL (ref 4.22–5.81)
RDW: 13.5 % (ref 11.5–15.5)
WBC: 9.5 10*3/uL (ref 4.0–10.5)
nRBC: 0 % (ref 0.0–0.2)

## 2022-02-05 LAB — TROPONIN I (HIGH SENSITIVITY): Troponin I (High Sensitivity): 16 ng/L (ref <18)

## 2022-02-05 MED ORDER — ACETAMINOPHEN 325 MG PO TABS
650.0000 mg | ORAL_TABLET | Freq: Once | ORAL | Status: AC
  Administered 2022-02-05: 650 mg via ORAL
  Filled 2022-02-05: qty 2

## 2022-02-05 MED ORDER — SODIUM CHLORIDE 0.9 % IV BOLUS
500.0000 mL | Freq: Once | INTRAVENOUS | Status: AC
  Administered 2022-02-05: 500 mL via INTRAVENOUS

## 2022-02-05 NOTE — ED Provider Notes (Signed)
[facility] Provider Note    Event Date/Time   First MD Initiated Contact with Patient 02/05/22 2223     (approximate)   History   Palpitations   HPI  Donald Barry is a 76 y.o. male with history of atrial fibrillation, sleep apnea, GERD, thrombocytopenia, and MGUS who presents with palpitations over the course the day today and elevated heart rate.  The patient states that he awoke around 430 this morning with palpitations and a high heart rate in the 140s.  He went back to sleep and then it persisted when he woke up again several hours later.  He called his cardiologist office and was told to take a dose of metoprolol.  The palpitations improved and the heart rate went down to the 120s.  This felt like prior episodes of atrial fibrillation.  However in the evening he started to have palpitations and a higher heart rate again.  He reports associated lightheadedness but no chest pain or difficulty breathing.  Just as he was being triaged, his palpitations resolved.  He still feels mildly lightheaded although better than previously.  I reviewed the past medical records.  The patient was most recently admitted in September 2022.  Per the hospitalist discharge summary who presented with constipation, possible UTI, and work-up showing bilateral pyelonephritis. Saw Dr. Fletcher Anon from cardiology on 2/3.  He is on metoprolol for paroxysmal atrial fibrillation but is not currently on anticoagulation.   Physical Exam   Triage Vital Signs: ED Triage Vitals  Enc Vitals Group     BP 02/05/22 2210 (!) 87/68     Pulse Rate 02/05/22 2208 97     Resp 02/05/22 2208 18     Temp 02/05/22 2208 98.5 F (36.9 C)     Temp Source 02/05/22 2208 Oral     SpO2 02/05/22 2208 96 %     Weight --      Height --      Head Circumference --      Peak Flow --      Pain Score --      Pain Loc --      Pain Edu? --      Excl. in [place]? --     Most recent vital signs: Vitals:   02/05/22  2210 02/05/22 2234  BP: (!) 87/68 103/73  Pulse:  83  Resp:  16  Temp:    SpO2:  97%     General: Alert and oriented, well-appearing. CV:  Good peripheral perfusion.  Normal heart sounds. Resp:  Normal effort.  Lungs CTAB. Abd:  No distention.  Other:  No peripheral edema.   ED Results / Procedures / Treatments   Labs (all labs ordered are listed, but only abnormal results are displayed) Labs Reviewed  BASIC METABOLIC PANEL - Abnormal; Notable for the following components:      Result Value   CO2 21 (*)    Glucose, Bld 125 (*)    All other components within normal limits  CBC  BRAIN NATRIURETIC PEPTIDE  LACTIC ACID, PLASMA  LACTIC ACID, PLASMA  TROPONIN I (HIGH SENSITIVITY)     EKG  ED ECG REPORT I, Arta Silence, the attending physician, personally viewed and interpreted this ECG.  Date: 02/05/2022 EKG Time: 2202 Rate: 99 Rhythm: normal sinus rhythm QRS Axis: normal Intervals: normal ST/T Wave abnormalities: normal Narrative Interpretation: no evidence of acute ischemia    RADIOLOGY  Chest x-ray: I independently viewed and interpreted the images;  there is no focal consolidation or edema  PROCEDURES:  Critical Care performed: No  Procedures   MEDICATIONS ORDERED IN ED: Medications  acetaminophen (TYLENOL) tablet 650 mg (650 mg Oral Given 02/05/22 2247)  sodium chloride 0.9 % bolus 500 mL (500 mLs Intravenous New Bag/Given 02/05/22 2247)     IMPRESSION / MDM / ASSESSMENT AND PLAN / ED COURSE  I reviewed the triage vital signs and the nursing notes.  76 year old male with PMH as noted above including paroxysmal atrial fibrillation presents with palpitations over the course of the day and elevated heart rate that was refractory to 2 additional doses of metoprolol.  The symptoms resolved during triage.  On exam the patient is well-appearing.  His initial blood pressure was low but the repeat is improved.  Other vital signs are normal.  The  physical exam is otherwise unremarkable.  EKG now shows sinus rhythm with no ischemic changes.  Differential diagnosis includes, but is not limited to, paroxysmal atrial fibrillation, other tachydysrhythmia, sinus tachycardia.  Overall I suspect that the patient went into an episode of atrial fibrillation and the metoprolol converted him back to sinus but now he is slightly hypotensive due to effects of the medication.  We will obtain basic labs, cardiac enzymes, give a fluid bolus, and reassess.  I anticipate that if the patient's vital signs stabilize and his atrial fibrillation does not return, he may be appropriate for discharge home.  Patient's presentation is most consistent with acute presentation with potential threat to life or bodily function.  The patient is on the cardiac monitor to evaluate for evidence of arrhythmia and/or significant heart rate changes.  ----------------------------------------- 10:57 PM on 02/05/2022 -----------------------------------------  Initial labs are unremarkable with negative troponin, normal electrolytes and creatinine, and no leukocytosis.  We will plan to observe for approximately 2 hours, check a repeat troponin, and reassess.  I have signed the patient out to the oncoming ED physician Dr. Karma Greaser.   FINAL CLINICAL IMPRESSION(S) / ED DIAGNOSES   Final diagnoses:  Paroxysmal atrial fibrillation ([place])     Rx / DC Orders   ED Discharge Orders     None        Note:  This document was prepared using Dragon voice recognition software and may include unintentional dictation errors.    Arta Silence, MD 02/05/22 2258

## 2022-02-05 NOTE — Telephone Encounter (Signed)
   Pt called this evening to report that likely since sometime in the middle of the night last night, he has been in afib.  When he awoke this AM, his HR on his Garmin watch was > 130 bpm.  He took metoprolol '25mg'$  and HRs have remained elevated throughout the day.  He is not on [place] (CHA2DS2VASc = 2 - prev short duration of afib and h/o anemia).  He has felt mildly dyspneic and shaky today.  He has not had any c/p, presyncope, or syncope.  Current HR is reported @ 148 bpm on his watch.  I advised that if he'd like to avoid the ED, he may take metoprolol '25mg'$  x 1 now.  If he either feels worse of after 60-90 mins his HRs remain rapid, he should present to the ED for eval, anticoagulation, and further med mgmt (? TEE/DCCV in AM).  Ultimately he will likely require either AAD therapy and/or eval for PVI, along w/ initiation of [place].  Caller verbalized understanding and was grateful for the call back.  Murray Hodgkins, NP 02/05/2022, 7:35 PM

## 2022-02-05 NOTE — ED Provider Triage Note (Signed)
Emergency Medicine Provider Triage Evaluation Note  Donald Barry, a 76 y.o. male  was evaluated in triage.  Pt complains of A-fib.  Patient with a history of the same, currently on metoprolol was asked to hold his metoprolol while on a course of Paxlovid for recently diagnosed COVID.  Patient reports he began to experience some heart palpitations today, and did take his Toprol for the first time in several days.  He presents to the ED reporting some mild headache, and some mild shortness of breath secondary to A-fib.  Review of Systems  Positive: Palpitations Negative: CP, SOB  Physical Exam  There were no vitals taken for this visit. Gen:   Awake, no distress  NAD Resp:  Normal effort CTA MSK:   Moves extremities without difficulty  CVS:  RRR  Medical Decision Making  Medically screening exam initiated at 10:01 PM.  Appropriate orders placed.  Donald Barry was informed that the remainder of the evaluation will be completed by another provider, this initial triage assessment does not replace that evaluation, and the importance of remaining in the ED until their evaluation is complete.  Patient to the ED for evaluation of heart palpitations. He has been off of his metoprolol since being treated for covid with Paxlovid.    Melvenia Needles, PA-C 02/05/22 2219

## 2022-02-05 NOTE — ED Triage Notes (Signed)
Pt dx with COVID 01/25/22 and prescribed paxlovid. While on paxlovid pt stopped taking metoprolol. Pt woke this morning and felt he was in afib and took metoprolol and felt like heart rate went down and then this evening felt heart palpitations again and called MD and was advised to come here. While in triage pt fells like he is in afib. Pt rate 96 in triage. Denies chest pain. Slight headache.

## 2022-02-06 ENCOUNTER — Telehealth: Payer: Self-pay | Admitting: Cardiovascular Disease

## 2022-02-06 ENCOUNTER — Other Ambulatory Visit: Payer: Self-pay

## 2022-02-06 DIAGNOSIS — I480 Paroxysmal atrial fibrillation: Secondary | ICD-10-CM | POA: Diagnosis not present

## 2022-02-06 LAB — TROPONIN I (HIGH SENSITIVITY): Troponin I (High Sensitivity): 14 ng/L (ref <18)

## 2022-02-06 NOTE — ED Notes (Signed)
Discharge instructions were discussed with pt. Pt verbalized understanding with no additional questions at this time.

## 2022-02-06 NOTE — Telephone Encounter (Signed)
Pt c/o medication issue:  1. Name of Medication: metoprolol tartrate (LOPRESSOR) 25 MG tablet (Expired)  2. How are you currently taking this medication (dosage and times per day)? As prescribed   3. Are you having a reaction (difficulty breathing--STAT)? No  4. What is your medication issue? Pt called after hour number yesterday to report afib and HR (see previous phone note) and also went to ED, but he now has questions about how he should take this medication with current readings. Please advise.     BP 114/72 HR 76

## 2022-02-06 NOTE — Telephone Encounter (Signed)
Spoke with the patient who states that he is feeling much better today. Due to low BP in the ER last night he was advised to call our office in regards to resuming his Lopressor 12.5 mg twice daily. Patient states that his blood pressure monitor and smart watch are not indicating that he is out of rhythm anymore. Earlier this morning his BP was 114/72 and HR 72. Advised patient to continue to monitor BP and HR and to resume Lopressor as prescribed as long as BP remains normal.

## 2022-02-06 NOTE — Telephone Encounter (Signed)
Continue metoprolol 12.5 mg twice daily.  Schedule an office follow-up visit this week with APP to initiate anticoagulation and see if the dose of metoprolol can be increased.

## 2022-02-06 NOTE — Telephone Encounter (Signed)
Spoke with the patient and scheduled him to come in and see Christell Faith, [place] tomorrow 9/28.

## 2022-02-06 NOTE — Progress Notes (Unsigned)
Cardiology Office Note    Date:  02/07/2022   ID:  Donald Barry, Donald Barry 1945-08-14, MRN [id number]  PCP:  Donald Late, MD  Cardiologist:  Donald Sacramento, MD  Electrophysiologist:  None   Chief Complaint: Follow-up  History of Present Illness:   Donald Barry is a 76 y.o. 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  He was admitted to the hospital in 01/2021 with constipation and pyelonephritis complicated by sepsis due to Enterobacter.  He had A-fib with RVR during the admission which was treated with rate control and subsequently converted to sinus rhythm.  He was not anticoagulated given a CHA2DS2-VASc of 1 and other medical issues making him high risk for bleeding.  Echo during that admission showed an EF of 50 to 55%, calcified aortic valve without significant stenosis, normal diastolic function and a dilated IVC.  He was evaluated in the ED in 06/2021 for A-fib with RVR in the setting of sleep study.  He was discharged on low-dose metoprolol and was noted to be in sinus rhythm following.  He was last seen in the office in 08/2021 and had not had any further recurrence of A-fib since then.  He had undergone extensive GI work-up for blood loss anemia which was unrevealing.  Given lack of recurrence of A-fib, and in the context of his anemia noted at that time with unrevealing GI work-up, anticoagulation was deferred.  He was diagnosed with COVID on 01/27/2022.  He contacted our office on 02/05/2022 noting tachypalpitations with rates greater than 130 bpm with recommendation to take an additional metoprolol 25 mg.  Initially, symptoms did improve with this though recurred.  For persistent symptoms, was recommended he proceed to the ED for further evaluation and discussion of anticoagulation.  In this setting, he was seen in the [facility] ED on  02/05/2022 with tachypalpitations with heart rates at home in the 140s bpm.  Initial BP soft 87/68.  EKG showed NSR, 99 bpm, and no acute ST-T changes.  High-sensitivity troponin negative x2.  BNP 339.  Chest x-ray nonacute.  Given resolution of symptoms he was discharged to outpatient follow-up.  Primary cardiologist has recommended the patient continue metoprolol 12.5 mg twice daily and follow-up today to initiate anticoagulation with possible titration of metoprolol as well.  He comes in accompanied by his wife today and is doing well from a cardiac perspective.  He has not symptoms of angina or decompensation.  Leading up to his above episode of A-fib, he had been diagnosed with COVID and held metoprolol while on the Paxlovid.  Prior to reinitiating metoprolol, he developed the above onset of A-fib with RVR.  Since his ER visit, he has been without symptoms of palpitations.  No syncope, falls, hematochezia, or melena.  He has been somewhat fatigued, more so than baseline, though does remain active at home.  He will be needing to undergo hydrocelectomy on 03/15/2022.   Labs independently reviewed: 01/2022 - Hgb 14.4, PLT 255, potassium 4.4, BUN 20, serum creatinine 1.23, albumin 3.9, AST/ALT normal, TC 101, TG 68, HDL 30, LDL 57 01/2021 - A1c 5.2, TSH normal  Past Medical History:  Diagnosis Date   A-fib ([place])    Actinic keratosis    Hx of PDT Tx., face 1/252021 and scalp 07/05/2019   Allergic rhinitis, seasonal 06/14/2014   Allergy    Arthritis  Chronic neck pain 63/78/5885   Complication of anesthesia    Gastroesophageal reflux disease without esophagitis 06/14/2014   Headache    h/o migraines   Hepatic steatosis 04/05/2018   Hiatal hernia 09/09/2017   Intermittent left lower quadrant abdominal pain 07/15/2017   PONV (postoperative nausea and vomiting)    Sleep apnea    cpap   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 KA-type   Squamous cell carcinoma of skin  12/22/2019   Left vertex scalp. SCCis   Squamous cell skin cancer 07/2017   Resected from scalp.    Past Surgical History:  Procedure Laterality Date   BACK SURGERY  2008   L4, L5   COLONOSCOPY     cystectomy with prostatectomy ileal conduit     CYSTO WITH HYDRODISTENSION N/A 06/17/2019   Procedure: CYSTOSCOPY/HYDRODISTENSION;  Surgeon: Billey Co, MD;  Location: [facility] ORS;  Service: Urology;  Laterality: N/A;   CYSTOSCOPY WITH BIOPSY N/A 07/17/2018   Procedure: CYSTOSCOPY WITH BLADDER BIOPSY;  Surgeon: Billey Co, MD;  Location: [facility] OR[facility];  Service: Urology;  Laterality: N/A;   CYSTOSCOPY WITH BIOPSY N/A 06/17/2019   Procedure: CYSTOSCOPY WITH BLADDER  BIOPSY;  Surgeon: Billey Co, MD;  Location: [facility] OR[facility];  Service: Urology;  Laterality: N/A;   CYSTOSCOPY WITH FULGERATION N/A 07/17/2018   Procedure: CYSTOSCOPY WITH FULGERATION;  Surgeon: Billey Co, MD;  Location: [facility];  Service: Urology;  Laterality: N/A;   CYSTOSCOPY WITH FULGERATION N/A 06/17/2019   Procedure: CYSTOSCOPY WITH FULGERATION;  Surgeon: Billey Co, MD;  Location: [facility];  Service: Urology;  Laterality: N/A;   ESOPHAGOGASTRODUODENOSCOPY     HERNIA REPAIR  0277   umbilical   STOMACH SURGERY     FUNDIC GLAND POLYP    Current Medications: Current Meds  Medication Sig   apixaban (ELIQUIS) 5 MG TABS tablet Take 1 tablet (5 mg total) by mouth 2 (two) times daily.   atorvastatin (LIPITOR) 10 MG tablet Take 1 tablet by mouth daily.   Coenzyme Q10 (COQ10) 100 MG CAPS Take 1 capsule by mouth daily.   ferrous sulfate 325 (65 FE) MG EC tablet TAKE 1 TABLET BY MOUTH EVERY DAY   fluticasone (FLONASE) 50 MCG/ACT nasal spray Place 1 spray into both nostrils daily.   guaiFENesin (MUCINEX) 600 MG 12 hr tablet Take by mouth 2 (two) times daily.   ibuprofen (ADVIL) 200 MG tablet Take by mouth.   metoprolol tartrate (LOPRESSOR) 25 MG tablet Take 0.5 tablets (12.5 mg total) by mouth 2 (two) times  daily.   Multiple Vitamin (MULTIVITAMIN WITH MINERALS) TABS tablet Take 1 tablet by mouth daily. Iron Free   NON FORMULARY Place 1 Dose/kg into the nose daily as needed. Netty pot for sinuses   omeprazole (PRILOSEC) 40 MG capsule Take 1 capsule by mouth daily.   traZODone (DESYREL) 50 MG tablet Take 50 mg by mouth at bedtime as needed.   [DISCONTINUED] aspirin EC 81 MG tablet Take 81 mg by mouth daily.     Allergies:   Tape, Wound dressing adhesive, Shellfish allergy, Shellfish-derived products, Onion, Other, and Codeine   Social History   Socioeconomic History   Marital status: Married    Spouse name: Not on file   Number of children: Not on file   Years of education: Not on file   Highest education level: Not on file  Occupational History   Not on file  Tobacco Use   Smoking status: Never  Smokeless tobacco: Never  Vaping Use   Vaping Use: Never used  Substance and Sexual Activity   Alcohol use: Never   Drug use: Never   Sexual activity: Yes    Birth control/protection: None  Other Topics Concern   Not on file  Social History Narrative   Not on file   Social Determinants of Health   Financial Resource Strain: Not on file  Food Insecurity: Not on file  Transportation Needs: Not on file  Physical Activity: Not on file  Stress: Not on file  Social Connections: Not on file     Family History:  The patient's family history includes GER disease in his brother and sister; Heart disease in his father; Hypertension in his mother. There is no history of Prostate cancer or Kidney cancer.  ROS:   12-point review of systems is negative unless otherwise noted in HPI.   EKGs/Labs/Other Studies Reviewed:    Studies reviewed were summarized above. The additional studies were reviewed today:  2D echo 01/12/2021: 1. Left ventricular ejection fraction, by estimation, is 50 to 55%. The  left ventricle has low normal function. The left ventricle demonstrates  global hypokinesis.  Left ventricular diastolic parameters are consistent  with Grade III diastolic dysfunction   (restrictive).   2. Right ventricular systolic function is normal. The right ventricular  size is normal.   3. 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 No  evidence of mitral stenosis.   4. The aortic valve is normal in structure. Aortic valve regurgitation is  mild. Mild aortic valve sclerosis is present, with no evidence of aortic  valve stenosis.   5. The inferior vena cava is normal in size with greater than 50%  respiratory variability, suggesting right atrial pressure of 3 mmHg.   EKG:  EKG is ordered today.  The EKG ordered today demonstrates NSR, 71 bpm, no acute ST-T changes  Recent Labs: 05/22/2021: ALT 16 02/05/2022: B Natriuretic Peptide 339.7; BUN 20; Creatinine, Ser 1.23; Hemoglobin 14.4; Platelets 255; Potassium 4.4; Sodium 140  Recent Lipid Panel No results found for: "CHOL", "TRIG", "HDL", "CHOLHDL", "VLDL", "LDLCALC", "LDLDIRECT"  PHYSICAL EXAM:    VS:  BP 116/86 (BP Location: Left Arm, Patient Position: Sitting, Cuff Size: Normal)   Pulse 71   Ht 6' (1.829 m)   Wt 191 lb (86.6 kg)   SpO2 98%   BMI 25.90 kg/m   BMI: Body mass index is 25.9 kg/m.  Physical Exam Vitals reviewed.  Constitutional:      Appearance: He is well-developed.  HENT:     Head: Normocephalic and atraumatic.  Eyes:     General:        Right eye: No discharge.        Left eye: No discharge.  Neck:     Vascular: No JVD.  Cardiovascular:     Rate and Rhythm: Normal rate and regular rhythm.     Pulses:          Posterior tibial pulses are 2+ on the right side and 2+ on the left side.     Heart sounds: Normal heart sounds, S1 normal and S2 normal. Heart sounds not distant. No midsystolic click and no opening snap. No murmur heard.    No friction rub.  Pulmonary:     Effort: Pulmonary effort is normal. No respiratory distress.     Breath sounds: Normal breath sounds.  No decreased breath sounds, wheezing or rales.  Chest:     Chest  wall: No tenderness.  Abdominal:     General: There is no distension.  Musculoskeletal:     Cervical back: Normal range of motion.     Right lower leg: No edema.     Left lower leg: No edema.  Skin:    General: Skin is warm and dry.     Nails: There is no clubbing.  Neurological:     Mental Status: He is alert and oriented to person, place, and time.  Psychiatric:        Speech: Speech normal.        Behavior: Behavior normal.        Thought Content: Thought content normal.        Judgment: Judgment normal.     Wt Readings from Last 3 Encounters:  02/07/22 191 lb (86.6 kg)  09/03/21 202 lb 9.6 oz (91.9 kg)  08/14/21 205 lb 8 oz (93.2 kg)     ASSESSMENT & PLAN:   PAF: Maintaining sinus rhythm.  This episode of A-fib may have been in the context of his recently diagnosed COVID illness as well as in the context of temporarily holding metoprolol while on Paxlovid.  Given this, we will continue him on current dose metoprolol CHA2DS2-VASc 3 (aortic atherosclerosis, age x2).  Stop aspirin.  Start apixaban 5 mg twice daily (beginning on 02/08/2022).  Follow-up BMP, CBC, and TSH one week after initiating [place].  History of anemia: Hgb normalized dating back to 11/2021.  No symptoms concerning for GI bleed at this time.  Follow-up CBC 1 week after initiating apixaban.  Aortic atherosclerosis/HLD: LDL 57 in 01/2022.  No symptoms concerning for angina or decompensation.  He remains on atorvastatin 10 mg for primary prevention.  Sleep apnea: CPAP.  This was not discussed in detail at today's visit.  Preoperative cardiac risk stratification: Scheduled to undergo a hydrocelectomy on 03/15/2022.  Per RCRI, he is low risk for noncardiac surgery with an estimated rate of 0.6% adverse cardiac event in the perioperative timeframe.  Per [facility] Activity Status Index, he can achieve greater than 4 METs.  Hold apixaban for 2 days prior to  noncardiac surgery with recommendation to resume apixaban as soon as safely possible postoperatively under the discretion of his surgeon.  No further cardiac testing is indicated and he may proceed at an overall low risk.   Disposition: F/u with Dr. Fletcher Anon or an APP in 4-6 weeks.   Medication Adjustments/Labs and Tests Ordered: Current medicines are reviewed at length with the patient today.  Concerns regarding medicines are outlined above. Medication changes, Labs and Tests ordered today are summarized above and listed in the Patient Instructions accessible in Encounters.   Signed, Christell Faith, PA-C 02/07/2022 12:14 PM     [place] [address] [place], [place] [address] [phone]

## 2022-02-07 ENCOUNTER — Ambulatory Visit: Payer: Managed Care, Other (non HMO) | Attending: Physician Assistant | Admitting: Physician Assistant

## 2022-02-07 ENCOUNTER — Encounter: Payer: Self-pay | Admitting: Physician Assistant

## 2022-02-07 VITALS — BP 116/86 | HR 71 | Ht 72.0 in | Wt 191.0 lb

## 2022-02-07 DIAGNOSIS — Z862 Personal history of diseases of the blood and blood-forming organs and certain disorders involving the immune mechanism: Secondary | ICD-10-CM

## 2022-02-07 DIAGNOSIS — I480 Paroxysmal atrial fibrillation: Secondary | ICD-10-CM

## 2022-02-07 DIAGNOSIS — E785 Hyperlipidemia, unspecified: Secondary | ICD-10-CM | POA: Diagnosis not present

## 2022-02-07 DIAGNOSIS — G4730 Sleep apnea, unspecified: Secondary | ICD-10-CM

## 2022-02-07 DIAGNOSIS — I700 Atherosclerosis of aorta: Secondary | ICD-10-CM

## 2022-02-07 DIAGNOSIS — Z01810 Encounter for preprocedural cardiovascular examination: Secondary | ICD-10-CM

## 2022-02-07 MED ORDER — APIXABAN 5 MG PO TABS: 5.0000 mg | ORAL_TABLET | Freq: Two times a day (BID) | ORAL | 11 refills | Status: DC

## 2022-02-07 NOTE — Patient Instructions (Signed)
Medication Instructions:  Your physician has recommended you make the following change in your medication:   STOP Aspirin START Eliquis 5 mg twice a day  Prior to your surgery on November 3 you will need to hold Eliquis starting on 11/1 and then restart per surgeon.   *If you need a refill on your cardiac medications before your next appointment, please call your pharmacy*   Lab Work: CBC, BMET, and TSH in one week over at the following location: NO appointment is needed [place] [facility] at [facility] 1st desk on the right to check in (REGISTRATION)  Lab hours: Monday- Friday (7:30 am- 5:30 pm)  If you have labs (blood work) drawn today and your tests are completely normal, you will receive your results only by: [place] (if you have MyChart) OR A paper copy in the mail If you have any lab test that is abnormal or we need to change your treatment, we will call you to review the results.   Testing/Procedures: None   Follow-Up: At [facility], you and your health needs are our priority.  As part of our continuing mission to provide you with exceptional heart care, we have created designated Provider Care Teams.  These Care Teams include your primary Cardiologist (physician) and Advanced Practice Providers (APPs -  Physician Assistants and Nurse Practitioners) who all work together to provide you with the care you need, when you need it.  Your next appointment:   4-6 week(s)  The format for your next appointment:   In Person  Provider:   Kathlyn Sacramento, MD or Christell Faith, PA-C       Important Information About Sugar

## 2022-02-15 ENCOUNTER — Other Ambulatory Visit: Payer: Self-pay | Admitting: *Deleted

## 2022-02-15 ENCOUNTER — Other Ambulatory Visit
Admission: RE | Admit: 2022-02-15 | Discharge: 2022-02-15 | Disposition: A | Discharge status: Home / Self Care | Payer: Medicare HMO | Attending: Physician Assistant | Admitting: Physician Assistant

## 2022-02-15 ENCOUNTER — Encounter: Payer: Self-pay | Admitting: Oncology

## 2022-02-15 DIAGNOSIS — I480 Paroxysmal atrial fibrillation: Secondary | ICD-10-CM

## 2022-02-15 DIAGNOSIS — Z862 Personal history of diseases of the blood and blood-forming organs and certain disorders involving the immune mechanism: Secondary | ICD-10-CM

## 2022-02-15 DIAGNOSIS — I700 Atherosclerosis of aorta: Secondary | ICD-10-CM

## 2022-02-15 LAB — BASIC METABOLIC PANEL
Anion gap: 6 (ref 5–15)
BUN: 17 mg/dL (ref 8–23)
CO2: 27 mmol/L (ref 22–32)
Calcium: 9.2 mg/dL (ref 8.9–10.3)
Chloride: 106 mmol/L (ref 98–111)
Creatinine, Ser: 1.02 mg/dL (ref 0.61–1.24)
GFR, Estimated: >60 mL/min (ref >60)
Glucose, Bld: 82 mg/dL (ref 70–99)
Potassium: 4.6 mmol/L (ref 3.5–5.1)
Sodium: 139 mmol/L (ref 135–145)

## 2022-02-15 LAB — CBC
HCT: 38.9 % — ABNORMAL LOW (ref 39.0–52.0)
Hemoglobin: 12.9 g/dL — ABNORMAL LOW (ref 13.0–17.0)
MCH: 30.1 pg (ref 26.0–34.0)
MCHC: 33.2 g/dL (ref 30.0–36.0)
MCV: 90.9 fL (ref 80.0–100.0)
Platelets: 189 10*3/uL (ref 150–400)
RBC: 4.28 MIL/uL (ref 4.22–5.81)
RDW: 13.8 % (ref 11.5–15.5)
WBC: 6.4 10*3/uL (ref 4.0–10.5)
nRBC: 0 % (ref 0.0–0.2)

## 2022-02-15 LAB — TSH: TSH: 2.256 u[IU]/mL (ref 0.350–4.500)

## 2022-03-01 ENCOUNTER — Other Ambulatory Visit
Admission: RE | Admit: 2022-03-01 | Discharge: 2022-03-01 | Disposition: A | Discharge status: Home / Self Care | Payer: Medicare HMO | Attending: Physician Assistant | Admitting: Physician Assistant

## 2022-03-01 DIAGNOSIS — Z862 Personal history of diseases of the blood and blood-forming organs and certain disorders involving the immune mechanism: Secondary | ICD-10-CM | POA: Diagnosis present

## 2022-03-01 DIAGNOSIS — I700 Atherosclerosis of aorta: Secondary | ICD-10-CM | POA: Insufficient documentation

## 2022-03-01 DIAGNOSIS — I480 Paroxysmal atrial fibrillation: Secondary | ICD-10-CM | POA: Diagnosis present

## 2022-03-01 LAB — CBC
HCT: 37.9 % — ABNORMAL LOW (ref 39.0–52.0)
Hemoglobin: 12.4 g/dL — ABNORMAL LOW (ref 13.0–17.0)
MCH: 30 pg (ref 26.0–34.0)
MCHC: 32.7 g/dL (ref 30.0–36.0)
MCV: 91.8 fL (ref 80.0–100.0)
Platelets: 250 10*3/uL (ref 150–400)
RBC: 4.13 MIL/uL — ABNORMAL LOW (ref 4.22–5.81)
RDW: 13.9 % (ref 11.5–15.5)
WBC: 9.2 10*3/uL (ref 4.0–10.5)
nRBC: 0 % (ref 0.0–0.2)

## 2022-03-06 ENCOUNTER — Other Ambulatory Visit: Payer: Medicare HMO

## 2022-03-06 ENCOUNTER — Ambulatory Visit: Payer: Medicare HMO | Admitting: Oncology

## 2022-03-14 ENCOUNTER — Encounter: Payer: Self-pay | Admitting: Cardiovascular Disease

## 2022-03-14 ENCOUNTER — Ambulatory Visit: Payer: Medicare HMO | Attending: Cardiovascular Disease | Admitting: Cardiovascular Disease

## 2022-03-14 VITALS — BP 110/70 | HR 101 | Ht 72.0 in | Wt 192.4 lb

## 2022-03-14 DIAGNOSIS — I480 Paroxysmal atrial fibrillation: Secondary | ICD-10-CM | POA: Diagnosis not present

## 2022-03-14 DIAGNOSIS — E785 Hyperlipidemia, unspecified: Secondary | ICD-10-CM | POA: Diagnosis not present

## 2022-03-14 NOTE — Progress Notes (Signed)
Cardiology Office Note   Date:  03/14/2022   ID:  Barry, Donald [birth year], MRN [id number]  PCP:  Derinda Late, MD  Cardiologist:   Kathlyn Sacramento, MD   Chief Complaint  Patient presents with   Other    6 month f/u no complaints today. Meds reviewed verbally with pt.      History of Present Illness: Donald Barry is a 76 y.o. male who is here today for follow-up visit regarding paroxysmal atrial fibrillation. 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 He was hospitalized in September with constipation and  pyelonephritis complicated by sepsis due to Enterobacter.  He did have atrial fibrillation with RVR during that admission which was treated with rate control and subsequently converted to sinus rhythm.  He was not anticoagulated due to CHA2DS2-VASc score of 1 and other medical issues making him high risk for bleeding. He had an echocardiogram done during that admission which  showed low normal LV systolic function with an EF of 50 to 55%, calcified aortic valve without significant stenosis, normal diastolic function and dilated IVC.  He did have recurrent A-fib with RVR in the setting of having a sleep study done.  He underwent extensive GI work-up for blood loss anemia which has been nonrevealing.    He had recurrent A-fib with RVR in September but was noted to be in sinus rhythm in the ED.  He had COVID just before that.  He was subsequently started on Eliquis for anticoagulation has been doing well.  He has been doing well overall with no chest pain or worsening dyspnea.  No significant palpitations.  He is scheduled for hydrocal ectomy tomorrow and Eliquis is currently on hold.  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 SCCis   Squamous cell skin cancer 07/2017   Resected from scalp.    Past Surgical History:  Procedure Laterality Date   BACK SURGERY  2008   L4, L5   COLONOSCOPY     cystectomy with prostatectomy ileal conduit     CYSTO WITH HYDRODISTENSION N/A 06/17/2019   Procedure: CYSTOSCOPY/HYDRODISTENSION;  Surgeon: Billey Co, MD;  Location: [facility] ORS;  Service: Urology;  Laterality: N/A;   CYSTOSCOPY WITH BIOPSY N/A 07/17/2018   Procedure: CYSTOSCOPY WITH BLADDER BIOPSY;  Surgeon: Billey Co, MD;  Location: [facility] OR[facility];  Service: Urology;  Laterality: N/A;   CYSTOSCOPY WITH BIOPSY N/A 06/17/2019   Procedure: CYSTOSCOPY WITH BLADDER  BIOPSY;  Surgeon: Billey Co, MD;  Location: [facility] OR[facility];  Service: Urology;  Laterality: N/A;   CYSTOSCOPY WITH FULGERATION N/A 07/17/2018   Procedure: CYSTOSCOPY WITH FULGERATION;  Surgeon: Billey Co, MD;  Location:  [facility] OR[facility];  Service: Urology;  Laterality: N/A;   CYSTOSCOPY WITH FULGERATION N/A 06/17/2019   Procedure: CYSTOSCOPY WITH FULGERATION;  Surgeon: Billey Co, MD;  Location: [facility];  Service: Urology;  Laterality: N/A;   ESOPHAGOGASTRODUODENOSCOPY     HERNIA REPAIR  [id number]   umbilical   STOMACH SURGERY     FUNDIC GLAND POLYP     Current Outpatient Medications  Medication Sig Dispense Refill   apixaban (ELIQUIS) 5 MG TABS tablet Take 1 tablet (5 mg total) by mouth 2 (two) times daily. 60 tablet 11   atorvastatin (LIPITOR)  10 MG tablet Take 1 tablet by mouth daily.     Coenzyme Q10 (COQ10) 100 MG CAPS Take 1 capsule by mouth daily.     ferrous sulfate 325 (65 FE) MG EC tablet TAKE 1 TABLET BY MOUTH EVERY DAY 90 tablet 1   fluticasone (FLONASE) 50 MCG/ACT nasal spray Place 1 spray into both nostrils daily.     guaiFENesin (MUCINEX) 600 MG 12 hr tablet Take by mouth 2 (two) times daily.     metoprolol tartrate (LOPRESSOR) 25 MG tablet Take 0.5 tablets (12.5 mg total) by mouth 2 (two) times daily. 30 tablet 0   Multiple Vitamin (MULTIVITAMIN WITH MINERALS) TABS tablet Take 1 tablet by mouth daily. Iron Free     NON FORMULARY Place 1 Dose/kg into the nose daily as needed. Netty pot for sinuses     omeprazole (PRILOSEC) 40 MG capsule Take 1 capsule by mouth daily.     traZODone (DESYREL) 50 MG tablet Take 50 mg by mouth at bedtime as needed.     No current facility-administered medications for this visit.    Allergies:   Tape, Wound dressing adhesive, Shellfish allergy, Shellfish-derived products, Onion, Other, and Codeine    Social History:  The patient  reports that he has never smoked. He has never used smokeless tobacco. He reports that he does not drink alcohol and does not use drugs.   Family History:  The patient's family history includes GER disease in his brother and sister; Heart disease in his father; Hypertension in his mother.    ROS:  Please see the history of present illness.   Otherwise, review of systems are positive for none.   All other systems are reviewed and negative.    PHYSICAL EXAM: VS:  BP 110/70 (BP Location: Left Arm, Patient Position: Sitting, Cuff Size: Normal)   Pulse (!) 101   Ht 6' (1.829 m)   Wt 192 lb 6 oz (87.3 kg)   SpO2 98%   BMI 26.09 kg/m  , BMI Body mass index is 26.09 kg/m. GEN: Well nourished, well developed, in no acute distress  HEENT: normal  Neck: no JVD, carotid bruits, or masses Cardiac: RRR; no murmurs, rubs, or gallops,no edema  Respiratory:  clear to  auscultation bilaterally, normal work of breathing GI: soft, nontender, nondistended, + BS MS: no deformity or atrophy  Skin: warm and dry, no rash Neuro:  Strength and sensation are intact Psych: euthymic mood, full affect   EKG:  EKG is  ordered today. EKG showed sinus tachycardia with a heart rate of 101 bpm.  No significant ST or T wave changes.   Recent Labs: 05/22/2021: ALT 16 02/05/2022: B Natriuretic Peptide 339.7 02/15/2022: BUN 17; Creatinine, Ser 1.02; Potassium 4.6; Sodium 139; TSH 2.256 03/01/2022: Hemoglobin 12.4; Platelets 250    Lipid Panel No results found for: "CHOL", "TRIG", "HDL", "CHOLHDL", "VLDL", "LDLCALC", "LDLDIRECT"    Wt  Readings from Last 3 Encounters:  03/14/22 192 lb 6 oz (87.3 kg)  02/07/22 191 lb (86.6 kg)  09/03/21 202 lb 9.6 oz (91.9 kg)          06/15/2021    8:10 AM  PAD Screen  Previous PAD dx? No  Previous surgical procedure? Yes  Pain with walking? No  Feet/toe relief with dangling? No  Painful, non-healing ulcers? No  Extremities discolored? No      ASSESSMENT AND PLAN:  1.  Paroxysmal atrial fibrillation: He is maintaining in sinus rhythm.  He is tolerating anticoagulation with Eliquis 5 mg twice daily with stable hemoglobin.  No evidence of bleeding at this time.  Continue small dose metoprolol.    2.  Sleep apnea, he is now tolerating CPAP very well and reports significant improvement in symptoms.  3.  Hyperlipidemia: Currently on atorvastatin.  4.  Aortic atherosclerosis: No claudication.    Disposition:   FU in 6 months. Signed,  Kathlyn Sacramento, MD  03/14/2022 5:05 PM    [address]

## 2022-03-14 NOTE — Patient Instructions (Signed)
Medication Instructions:  Your physician recommends that you continue on your current medications as directed. Please refer to the Current Medication list given to you today.  *If you need a refill on your cardiac medications before your next appointment, please call your pharmacy*   Lab Work: None ordered If you have labs (blood work) drawn today and your tests are completely normal, you will receive your results only by: [place] (if you have MyChart) OR A paper copy in the mail If you have any lab test that is abnormal or we need to change your treatment, we will call you to review the results.   Follow-Up: At [facility], you and your health needs are our priority.  As part of our continuing mission to provide you with exceptional heart care, we have created designated Provider Care Teams.  These Care Teams include your primary Cardiologist (physician) and Advanced Practice Providers (APPs -  Physician Assistants and Nurse Practitioners) who all work together to provide you with the care you need, when you need it.  Your next appointment:   6 month(s)  The format for your next appointment:   In Person  Provider:   Kathlyn Sacramento, MD   Important Information About Sugar

## 2022-03-29 ENCOUNTER — Emergency Department: Payer: Medicare HMO

## 2022-03-29 ENCOUNTER — Inpatient Hospital Stay
Admission: EM | Admit: 2022-03-29 | Discharge: 2022-04-01 | DRG: 698 | Disposition: A | Discharge status: Home / Self Care | Payer: Medicare HMO | Attending: Internal Medicine | Admitting: Internal Medicine

## 2022-03-29 ENCOUNTER — Other Ambulatory Visit: Payer: Self-pay

## 2022-03-29 ENCOUNTER — Encounter: Payer: Self-pay | Admitting: Internal Medicine

## 2022-03-29 DIAGNOSIS — E785 Hyperlipidemia, unspecified: Secondary | ICD-10-CM | POA: Diagnosis present

## 2022-03-29 DIAGNOSIS — Z79899 Other long term (current) drug therapy: Secondary | ICD-10-CM

## 2022-03-29 DIAGNOSIS — N12 Tubulo-interstitial nephritis, not specified as acute or chronic: Secondary | ICD-10-CM

## 2022-03-29 DIAGNOSIS — N492 Inflammatory disorders of scrotum: Secondary | ICD-10-CM

## 2022-03-29 DIAGNOSIS — A419 Sepsis, unspecified organism: Secondary | ICD-10-CM | POA: Diagnosis present

## 2022-03-29 DIAGNOSIS — Z1152 Encounter for screening for COVID-19: Secondary | ICD-10-CM | POA: Diagnosis not present

## 2022-03-29 DIAGNOSIS — K760 Fatty (change of) liver, not elsewhere classified: Secondary | ICD-10-CM | POA: Diagnosis present

## 2022-03-29 DIAGNOSIS — B961 Klebsiella pneumoniae [K. pneumoniae] as the cause of diseases classified elsewhere: Secondary | ICD-10-CM | POA: Diagnosis present

## 2022-03-29 DIAGNOSIS — Y833 Surgical operation with formation of external stoma as the cause of abnormal reaction of the patient, or of later complication, without mention of misadventure at the time of the procedure: Secondary | ICD-10-CM | POA: Diagnosis present

## 2022-03-29 DIAGNOSIS — Z91013 Allergy to seafood: Secondary | ICD-10-CM | POA: Diagnosis not present

## 2022-03-29 DIAGNOSIS — N136 Pyonephrosis: Secondary | ICD-10-CM | POA: Diagnosis present

## 2022-03-29 DIAGNOSIS — Z85828 Personal history of other malignant neoplasm of skin: Secondary | ICD-10-CM

## 2022-03-29 DIAGNOSIS — Z885 Allergy status to narcotic agent status: Secondary | ICD-10-CM

## 2022-03-29 DIAGNOSIS — Z91018 Allergy to other foods: Secondary | ICD-10-CM

## 2022-03-29 DIAGNOSIS — Z7901 Long term (current) use of anticoagulants: Secondary | ICD-10-CM | POA: Diagnosis not present

## 2022-03-29 DIAGNOSIS — R509 Fever, unspecified: Secondary | ICD-10-CM

## 2022-03-29 DIAGNOSIS — I480 Paroxysmal atrial fibrillation: Secondary | ICD-10-CM | POA: Diagnosis present

## 2022-03-29 DIAGNOSIS — K5732 Diverticulitis of large intestine without perforation or abscess without bleeding: Secondary | ICD-10-CM

## 2022-03-29 DIAGNOSIS — K5720 Diverticulitis of large intestine with perforation and abscess without bleeding: Secondary | ICD-10-CM

## 2022-03-29 DIAGNOSIS — Z91048 Other nonmedicinal substance allergy status: Secondary | ICD-10-CM

## 2022-03-29 DIAGNOSIS — K219 Gastro-esophageal reflux disease without esophagitis: Secondary | ICD-10-CM | POA: Diagnosis present

## 2022-03-29 DIAGNOSIS — N453 Epididymo-orchitis: Secondary | ICD-10-CM

## 2022-03-29 DIAGNOSIS — G4733 Obstructive sleep apnea (adult) (pediatric): Secondary | ICD-10-CM

## 2022-03-29 DIAGNOSIS — Z936 Other artificial openings of urinary tract status: Secondary | ICD-10-CM

## 2022-03-29 DIAGNOSIS — N10 Acute tubulo-interstitial nephritis: Secondary | ICD-10-CM | POA: Diagnosis not present

## 2022-03-29 DIAGNOSIS — R1032 Left lower quadrant pain: Principal | ICD-10-CM

## 2022-03-29 DIAGNOSIS — T83598A Infection and inflammatory reaction due to other prosthetic device, implant and graft in urinary system, initial encounter: Principal | ICD-10-CM | POA: Diagnosis present

## 2022-03-29 DIAGNOSIS — N390 Urinary tract infection, site not specified: Secondary | ICD-10-CM | POA: Diagnosis not present

## 2022-03-29 DIAGNOSIS — N1330 Unspecified hydronephrosis: Secondary | ICD-10-CM | POA: Diagnosis not present

## 2022-03-29 DIAGNOSIS — N3010 Interstitial cystitis (chronic) without hematuria: Secondary | ICD-10-CM | POA: Diagnosis present

## 2022-03-29 DIAGNOSIS — Z906 Acquired absence of other parts of urinary tract: Secondary | ICD-10-CM | POA: Diagnosis not present

## 2022-03-29 DIAGNOSIS — A4159 Other Gram-negative sepsis: Secondary | ICD-10-CM | POA: Diagnosis present

## 2022-03-29 DIAGNOSIS — Z9889 Other specified postprocedural states: Secondary | ICD-10-CM

## 2022-03-29 LAB — CBC WITH DIFFERENTIAL/PLATELET
Abs Immature Granulocytes: 0.07 10*3/uL (ref 0.00–0.07)
Basophils Absolute: 0 10*3/uL (ref 0.0–0.1)
Basophils Relative: 0 %
Eosinophils Absolute: 0.1 10*3/uL (ref 0.0–0.5)
Eosinophils Relative: 1 %
HCT: 40.2 % (ref 39.0–52.0)
Hemoglobin: 13.4 g/dL (ref 13.0–17.0)
Immature Granulocytes: 1 %
Lymphocytes Relative: 13 %
Lymphs Abs: 1.5 10*3/uL (ref 0.7–4.0)
MCH: 29.6 pg (ref 26.0–34.0)
MCHC: 33.3 g/dL (ref 30.0–36.0)
MCV: 88.9 fL (ref 80.0–100.0)
Monocytes Absolute: 0.8 10*3/uL (ref 0.1–1.0)
Monocytes Relative: 7 %
Neutro Abs: 9.3 10*3/uL — ABNORMAL HIGH (ref 1.7–7.7)
Neutrophils Relative %: 78 %
Platelets: 212 10*3/uL (ref 150–400)
RBC: 4.52 MIL/uL (ref 4.22–5.81)
RDW: 14.5 % (ref 11.5–15.5)
WBC: 11.8 10*3/uL — ABNORMAL HIGH (ref 4.0–10.5)
nRBC: 0 % (ref 0.0–0.2)

## 2022-03-29 LAB — COMPREHENSIVE METABOLIC PANEL
ALT: 15 U/L (ref 0–44)
AST: 21 U/L (ref 15–41)
Albumin: 3.7 g/dL (ref 3.5–5.0)
Alkaline Phosphatase: 35 U/L — ABNORMAL LOW (ref 38–126)
Anion gap: 8 (ref 5–15)
BUN: 23 mg/dL (ref 8–23)
CO2: 25 mmol/L (ref 22–32)
Calcium: 9.2 mg/dL (ref 8.9–10.3)
Chloride: 107 mmol/L (ref 98–111)
Creatinine, Ser: 1.06 mg/dL (ref 0.61–1.24)
GFR, Estimated: >60 mL/min (ref >60)
Glucose, Bld: 104 mg/dL — ABNORMAL HIGH (ref 70–99)
Potassium: 4.3 mmol/L (ref 3.5–5.1)
Sodium: 140 mmol/L (ref 135–145)
Total Bilirubin: 0.6 mg/dL (ref 0.3–1.2)
Total Protein: 7.5 g/dL (ref 6.5–8.1)

## 2022-03-29 LAB — RESP PANEL BY RT-PCR (FLU A&B, COVID) ARPGX2
Influenza A by PCR: NEGATIVE
Influenza B by PCR: NEGATIVE
SARS Coronavirus 2 by RT PCR: NEGATIVE

## 2022-03-29 LAB — URINALYSIS, ROUTINE W REFLEX MICROSCOPIC
Bilirubin Urine: NEGATIVE
Glucose, UA: NEGATIVE mg/dL
Ketones, ur: NEGATIVE mg/dL
Nitrite: NEGATIVE
Protein, ur: NEGATIVE mg/dL
Specific Gravity, Urine: 1.01 (ref 1.005–1.030)
Squamous Epithelial / HPF: NONE SEEN (ref 0–5)
WBC, UA: >50 WBC/hpf — ABNORMAL HIGH (ref 0–5)
pH: 6 (ref 5.0–8.0)

## 2022-03-29 LAB — LACTIC ACID, PLASMA: Lactic Acid, Venous: 1.4 mmol/L (ref 0.5–1.9)

## 2022-03-29 LAB — LIPASE, BLOOD: Lipase: 43 U/L (ref 11–51)

## 2022-03-29 LAB — PROCALCITONIN: Procalcitonin: <0.1 ng/mL

## 2022-03-29 MED ORDER — LACTATED RINGERS IV SOLN: INTRAVENOUS | Status: DC

## 2022-03-29 MED ORDER — HYDROMORPHONE HCL 1 MG/ML IJ SOLN
0.5000 mg | INTRAMUSCULAR | Status: DC | PRN
  Administered 2022-03-29: 0.5 mg via INTRAVENOUS
  Filled 2022-03-29: qty 1

## 2022-03-29 MED ORDER — VANCOMYCIN HCL 2000 MG/400ML IV SOLN
2000.0000 mg | Freq: Once | INTRAVENOUS | Status: AC
  Administered 2022-03-29: 2000 mg via INTRAVENOUS
  Filled 2022-03-29: qty 400

## 2022-03-29 MED ORDER — SODIUM CHLORIDE 0.9 % IV SOLN: INTRAVENOUS | Status: DC

## 2022-03-29 MED ORDER — FENTANYL CITRATE PF 50 MCG/ML IJ SOSY
50.0000 ug | PREFILLED_SYRINGE | Freq: Once | INTRAMUSCULAR | Status: AC
  Administered 2022-03-29: 50 ug via INTRAVENOUS
  Filled 2022-03-29: qty 1

## 2022-03-29 MED ORDER — ONDANSETRON HCL 4 MG/2ML IJ SOLN
4.0000 mg | Freq: Once | INTRAMUSCULAR | Status: AC
  Administered 2022-03-29: 4 mg via INTRAVENOUS
  Filled 2022-03-29: qty 2

## 2022-03-29 MED ORDER — LACTATED RINGERS IV BOLUS (SEPSIS)
1000.0000 mL | Freq: Once | INTRAVENOUS | Status: AC
  Administered 2022-03-29: 1000 mL via INTRAVENOUS

## 2022-03-29 MED ORDER — SODIUM CHLORIDE 0.9 % IV SOLN
2.0000 g | Freq: Once | INTRAVENOUS | Status: AC
  Administered 2022-03-29: 2 g via INTRAVENOUS
  Filled 2022-03-29: qty 12.5

## 2022-03-29 MED ORDER — HYDROMORPHONE HCL 1 MG/ML IJ SOLN
0.5000 mg | Freq: Once | INTRAMUSCULAR | Status: AC
  Administered 2022-03-29: 0.5 mg via INTRAVENOUS
  Filled 2022-03-29: qty 0.5

## 2022-03-29 MED ORDER — PANTOPRAZOLE SODIUM 40 MG PO TBEC
40.0000 mg | DELAYED_RELEASE_TABLET | Freq: Two times a day (BID) | ORAL | Status: DC
  Administered 2022-03-29 – 2022-04-01 (×7): 40 mg via ORAL
  Filled 2022-03-29 (×7): qty 1

## 2022-03-29 MED ORDER — PANTOPRAZOLE SODIUM 40 MG PO TBEC: 40.0000 mg | DELAYED_RELEASE_TABLET | Freq: Every day | ORAL | Status: DC

## 2022-03-29 MED ORDER — ACETAMINOPHEN 650 MG RE SUPP: 650.0000 mg | Freq: Four times a day (QID) | RECTAL | Status: DC | PRN

## 2022-03-29 MED ORDER — MORPHINE SULFATE (PF) 2 MG/ML IV SOLN
2.0000 mg | INTRAVENOUS | Status: DC | PRN
  Administered 2022-03-29: 2 mg via INTRAVENOUS
  Filled 2022-03-29: qty 1

## 2022-03-29 MED ORDER — ATORVASTATIN CALCIUM 10 MG PO TABS
10.0000 mg | ORAL_TABLET | Freq: Every day | ORAL | Status: DC
  Administered 2022-03-29 – 2022-04-01 (×4): 10 mg via ORAL
  Filled 2022-03-29 (×4): qty 1

## 2022-03-29 MED ORDER — TRAZODONE HCL 50 MG PO TABS
50.0000 mg | ORAL_TABLET | Freq: Every evening | ORAL | Status: DC | PRN
  Administered 2022-03-30: 50 mg via ORAL
  Filled 2022-03-29: qty 1

## 2022-03-29 MED ORDER — METOPROLOL TARTRATE 25 MG PO TABS
12.5000 mg | ORAL_TABLET | Freq: Two times a day (BID) | ORAL | Status: DC
  Administered 2022-03-29 – 2022-04-01 (×7): 12.5 mg via ORAL
  Filled 2022-03-29 (×7): qty 1

## 2022-03-29 MED ORDER — ACETAMINOPHEN 325 MG PO TABS
650.0000 mg | ORAL_TABLET | Freq: Four times a day (QID) | ORAL | Status: DC | PRN
  Administered 2022-03-29 – 2022-04-01 (×5): 650 mg via ORAL
  Filled 2022-03-29 (×5): qty 2

## 2022-03-29 MED ORDER — IOHEXOL 300 MG/ML  SOLN
100.0000 mL | Freq: Once | INTRAMUSCULAR | Status: AC | PRN
  Administered 2022-03-29: 100 mL via INTRAVENOUS

## 2022-03-29 MED ORDER — SODIUM CHLORIDE 0.9 % IV SOLN
2.0000 g | Freq: Three times a day (TID) | INTRAVENOUS | Status: DC
  Administered 2022-03-29 – 2022-04-01 (×9): 2 g via INTRAVENOUS
  Filled 2022-03-29 (×11): qty 12.5

## 2022-03-29 MED ORDER — OXYCODONE-ACETAMINOPHEN 5-325 MG PO TABS
1.0000 | ORAL_TABLET | ORAL | Status: DC | PRN
  Administered 2022-03-29 – 2022-03-30 (×5): 1 via ORAL
  Filled 2022-03-29 (×5): qty 1

## 2022-03-29 MED ORDER — HYDROCODONE-ACETAMINOPHEN 5-325 MG PO TABS: 1.0000 | ORAL_TABLET | ORAL | Status: DC | PRN

## 2022-03-29 MED ORDER — METRONIDAZOLE 500 MG/100ML IV SOLN
500.0000 mg | Freq: Two times a day (BID) | INTRAVENOUS | Status: DC
  Administered 2022-03-29 – 2022-04-01 (×7): 500 mg via INTRAVENOUS
  Filled 2022-03-29 (×8): qty 100

## 2022-03-29 MED ORDER — ACETAMINOPHEN 500 MG PO TABS
1000.0000 mg | ORAL_TABLET | Freq: Once | ORAL | Status: AC
  Administered 2022-03-29: 1000 mg via ORAL
  Filled 2022-03-29: qty 2

## 2022-03-29 NOTE — Progress Notes (Signed)
Pharmacy Antibiotic Note  Donald Barry is a 76 y.o. male admitted on 03/29/2022 with intra-abdominal infection.  Pharmacy has been consulted for Cefepime dosing for 7 days.  Plan: Cefepime 2 gm q8hr per indication & renal fxn.  Pharmacy will continue to follow and will adjust abx dosing whenever warranted.  Temp (24hrs), Avg:99.4 F (37.4 C), Min:97.8 F (36.6 C), Max:101.9 F (38.8 C)   Recent Labs  Lab 03/29/22 0037 03/29/22 0322  WBC 11.8*  --   CREATININE 1.06  --   LATICACIDVEN  --  1.4    Estimated Creatinine Clearance: 66.1 mL/min (by C-G formula based on SCr of 1.06 mg/dL).    Allergies  Allergen Reactions   Tape Dermatitis    Skin blistered- steri strip  Skin blistered   Wound Dressing Adhesive Dermatitis    Skin blistered- steri strip   Shellfish Allergy Diarrhea and Nausea And Vomiting    Shrimp    Shellfish-Derived Products Diarrhea, Nausea And Vomiting and Nausea Only   Onion Other (See Comments)    diarrhea Diarrhea- verified avoids all onion ingredient (including onion powder). MM, RD 12/19/20   Other Nausea And Vomiting    general anesthesia   Codeine Nausea Only    Antimicrobials this admission: 11/17 Vancomycin >> x 1 dose 11/17 Cefepime >> x 7 days  Microbiology results: 11/17 BCx: Pending 11/17 UCx: Pending   Thank you for allowing pharmacy to be a part of this patient's care.  Renda Rolls, PharmD, [facility] 03/29/2022 5:46 AM

## 2022-03-29 NOTE — Assessment & Plan Note (Signed)
Chronic anticoagulation Continue metoprolol  Will hold apixaban in case of surgical procedure

## 2022-03-29 NOTE — Assessment & Plan Note (Addendum)
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 Cefepime as above Urology consulted Follow cultures

## 2022-03-29 NOTE — ED Provider Notes (Signed)
[facility] Provider Note    Event Date/Time   First MD Initiated Contact with Patient 03/29/22 0124     (approximate)   History   Abdominal Pain   HPI  Donald Barry is a 76 y.o. male with history of atrial fibrillation on Eliquis, hyperlipidemia, sleep apnea, previous cystectomy with ileal conduit in 12/2020 due to end-stage interstitial cystitis who presents to the emergency department with his wife with complaints of left lower quadrant abdominal pain, left-sided testicular pain.  Patient underwent hydrocelectomy on the left with Dr. [name] with [facility] on 03/15/2022.  States he had a Penrose drain at the base of his scrotum that fell out tonight which he was told would be normal by his urologist.  He denies any fevers, vomiting, diarrhea.  He has a urostomy and has been making good urine output.    History provided by patient and wife.    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 SCCis   Squamous cell skin cancer 07/2017   Resected from scalp.    Past Surgical History:  Procedure Laterality Date   BACK SURGERY  2008   L4, L5   COLONOSCOPY     cystectomy with prostatectomy ileal conduit     CYSTO WITH HYDRODISTENSION N/A 06/17/2019   Procedure: CYSTOSCOPY/HYDRODISTENSION;  Surgeon: Billey Co,  MD;  Location: [facility] ORS;  Service: Urology;  Laterality: N/A;   CYSTOSCOPY WITH BIOPSY N/A 07/17/2018   Procedure: CYSTOSCOPY WITH BLADDER BIOPSY;  Surgeon: Billey Co, MD;  Location: [facility] ORS;  Service: Urology;  Laterality: N/A;   CYSTOSCOPY WITH BIOPSY N/A 06/17/2019   Procedure: CYSTOSCOPY WITH BLADDER  BIOPSY;  Surgeon: Billey Co, MD;  Location: [facility] ORS;  Service: Urology;  Laterality: N/A;   CYSTOSCOPY WITH FULGERATION N/A 07/17/2018   Procedure: CYSTOSCOPY WITH FULGERATION;  Surgeon: Billey Co, MD;  Location: [facility] ORS;  Service: Urology;  Laterality: N/A;   CYSTOSCOPY WITH FULGERATION N/A 06/17/2019   Procedure: CYSTOSCOPY WITH FULGERATION;  Surgeon: Billey Co, MD;  Location: [facility] ORS;  Service: Urology;  Laterality: N/A;   ESOPHAGOGASTRODUODENOSCOPY     HERNIA REPAIR  [id number]   umbilical   STOMACH SURGERY     FUNDIC GLAND POLYP    MEDICATIONS:  Prior to Admission medications   Medication Sig Start Date End Date Taking? Authorizing Provider  apixaban (ELIQUIS) 5 MG TABS tablet Take 1 tablet (5 mg total) by mouth 2 (two) times daily. 02/07/22   Dunn, Areta Haber, PA-C  atorvastatin (LIPITOR) 10 MG tablet Take 1 tablet by mouth daily. 08/06/19   [provider]  Coenzyme Q10 (COQ10) 100 MG CAPS Take 1 capsule by mouth daily.    [provider]  ferrous sulfate 325 (65  FE) MG EC tablet TAKE 1 TABLET BY MOUTH EVERY DAY 12/05/21   Sindy Guadeloupe, MD  fluticasone [facility]) 50 MCG/ACT nasal spray Place 1 spray into both nostrils daily.    [provider]  guaiFENesin (MUCINEX) 600 MG 12 hr tablet Take by mouth 2 (two) times daily.    [provider]  metoprolol tartrate (LOPRESSOR) 25 MG tablet Take 0.5 tablets (12.5 mg total) by mouth 2 (two) times daily. 06/13/21   Delman Kitten, MD  Multiple Vitamin (MULTIVITAMIN WITH MINERALS) TABS tablet Take 1 tablet by mouth daily. Iron Free    [provider]  NON FORMULARY Place 1 Dose/kg  into the nose daily as needed. Netty pot for sinuses    [provider]  omeprazole (PRILOSEC) 40 MG capsule Take 1 capsule by mouth daily. 10/25/19   [provider]  traZODone (DESYREL) 50 MG tablet Take 50 mg by mouth at bedtime as needed. 12/20/21   [provider]    Physical Exam   Triage Vital Signs: ED Triage Vitals  Enc Vitals Group     BP 03/29/22 0033 131/81     Pulse Rate 03/29/22 0033 98     Resp 03/29/22 0033 18     Temp 03/29/22 0033 97.8 F (36.6 C)     Temp Source 03/29/22 0033 Oral     SpO2 03/29/22 0033 100 %     Weight 03/29/22 0030 183 lb (83 kg)     Height 03/29/22 0030 6' (1.829 m)     Head Circumference --      Peak Flow --      Pain Score 03/29/22 0030 8     Pain Loc --      Pain Edu? --      Excl. in [place]? --     Most recent vital signs: Vitals:   03/29/22 0400 03/29/22 0430  BP: 123/75 111/74  Pulse: 92 90  Resp:    Temp: 98.5 F (36.9 C)   SpO2: 97% 97%    CONSTITUTIONAL: Alert and oriented and responds appropriately to questions.  Elderly, appears uncomfortable, nontoxic in appearance HEAD: Normocephalic, atraumatic EYES: Conjunctivae clear, pupils appear equal, sclera nonicteric ENT: normal nose; moist mucous membranes NECK: Supple, normal ROM CARD: Regular and tachycardic; S1 and S2 appreciated; no murmurs, no clicks, no rubs, no gallops RESP: Normal chest excursion without splinting or tachypnea; breath sounds clear and equal bilaterally; no wheezes, no rhonchi, no rales, no hypoxia or respiratory distress, speaking full sentences ABD/GI: Normal bowel sounds; non-distended; soft, tender to palpation of the left lower quadrant without guarding or rebound, stoma in the right lower quadrant appears normal with slightly cloudy yellow appearing urine in the bag GU: Penis appears normal.  There is no blood or discharge at the urethral meatus.  He is quite tender over the scrotum with a small amount of redness, warmth and  induration noted mostly to the left side of the scrotum.  There is a 4 mm incision site that is open that is located at the bottom of the scrotum with small amount of clear drainage.  He is quite tender over the left testicle and not able to tolerate much of an exam but I do not appreciate any masses or that the testicle is high riding.  Right testicle is nontender.  No hernias appreciated. BACK: The back appears normal EXT: Normal ROM in all joints; no deformity noted, no edema; no cyanosis SKIN: Normal color for age and race;  warm; no rash on exposed skin NEURO: Moves all extremities equally, normal speech PSYCH: The patient's mood and manner are appropriate.   ED Results / Procedures / Treatments   LABS: (all labs ordered are listed, but only abnormal results are displayed) Labs Reviewed  CBC WITH DIFFERENTIAL/PLATELET - Abnormal; Notable for the following components:      Result Value   WBC 11.8 (*)    Neutro Abs 9.3 (*)    All other components within normal limits  COMPREHENSIVE METABOLIC PANEL - Abnormal; Notable for the following components:   Glucose, Bld 104 (*)    Alkaline Phosphatase 35 (*)    All other components within normal limits  URINALYSIS, ROUTINE W REFLEX MICROSCOPIC - Abnormal; Notable for the following components:   Color, Urine YELLOW (*)    APPearance CLOUDY (*)    Hgb urine dipstick SMALL (*)    Leukocytes,Ua LARGE (*)    WBC, UA >50 (*)    Bacteria, UA MANY (*)    All other components within normal limits  RESP PANEL BY RT-PCR (FLU A&B, COVID) ARPGX2  URINE CULTURE  CULTURE, BLOOD (ROUTINE X 2)  CULTURE, BLOOD (ROUTINE X 2)  LIPASE, BLOOD  LACTIC ACID, PLASMA  PROCALCITONIN     EKG:    EKG Interpretation  Date/Time:  Friday March 29 2022 04:50:31 EST Ventricular Rate:  86 PR Interval:  170 QRS Duration: 88 QT Interval:  402 QTC Calculation: 481 R Axis:   52 Text Interpretation: Sinus rhythm ST depr, consider ischemia, inferior leads  Borderline prolonged QT interval Confirmed by Pryor Curia [phone]) on 03/29/2022 5:00:52 AM         RADIOLOGY: My personal review and interpretation of imaging: Imaging shows scrotal cellulitis, left epididymoorchitis, pyelonephritis and acute perforated sigmoid diverticulitis without abscess or free air.  I have personally reviewed all radiology reports.   CT ABDOMEN PELVIS W CONTRAST  Result Date: 03/29/2022 CLINICAL DATA:  Left lower quadrant abdominal pain, chills EXAM: CT ABDOMEN AND PELVIS WITH CONTRAST TECHNIQUE: Multidetector CT imaging of the abdomen and pelvis was performed using the standard protocol following bolus administration of intravenous contrast. RADIATION DOSE REDUCTION: This exam was performed according to the departmental dose-optimization program which includes automated exposure control, adjustment of the mA and/or kV according to patient size and/or use of iterative reconstruction technique. CONTRAST:  150m OMNIPAQUE IOHEXOL 300 MG/ML  SOLN COMPARISON:  08/10/2021 FINDINGS: Lower chest: Bibasilar atelectasis. Cardiac size within normal limits. Large hiatal hernia. Hepatobiliary: Cholelithiasis without pericholecystic inflammatory change. Tiny probable cyst within the right hepatic dome. Liver otherwise unremarkable. No intra or extrahepatic biliary ductal dilation. Pancreas: Unremarkable Spleen: Unremarkable Adrenals/Urinary Tract: The adrenal glands are unremarkable. Surgical changes of cystectomy and ileal conduit formation are identified with a right mid abdominal urostomy identified. Small fat containing parastomal hernia. The kidneys are normal in size and position. There is mild right and moderate left hydronephrosis with delayed left renal cortical enhancement noted related to changes of obstructive uropathy. There is, additionally, superimposed mild left periureteric inflammatory stranding and marked urothelial enhancement suggesting superimposed infection. Ureteral  dilation bilaterally extends to the level of the ureteral anastomoses. Immediately adjacent to the distal left ureter are extensive inflammatory changes and punctate foci of extraluminal gas in keeping with changes of acute diverticulitis described below. No perinephric fluid collections are identified. No intrarenal or ureteral calculi are seen. The conduit itself is decompressed. Stomach/Bowel: Moderate sigmoid diverticulosis. There is focal circumferential colonic wall thickening and extensive pericolonic inflammatory stranding  with punctate foci of extraluminal gas adjacent to the mid sigmoid colon in keeping with changes of acute, perforated sigmoid diverticulitis. No evidence of obstruction. No gross free intraperitoneal gas. No free intraperitoneal fluid or loculated intra-abdominal fluid collections. The stomach, small bowel, and large bowel are otherwise unremarkable. Appendix normal. Vascular/Lymphatic: Mild aortoiliac atherosclerotic calcification. No aortic aneurysm. No pathologic adenopathy within the abdomen and pelvis. Reproductive: The prostate gland and seminal vesicles are unremarkable. There is scrotal wall thickening noted. There is punctate gas within the left hemiscrotum, likely postsurgical in nature given the history of recent hydrocelectomy. Small left varicocele noted. Other: None Musculoskeletal: Osseous structures are age-appropriate. No acute bone abnormality. IMPRESSION: 1. Acute, perforated sigmoid diverticulitis. No evidence of obstruction, intra-abdominal abscess formation, or gross free intraperitoneal gas. 2. Surgical changes of cystectomy and ileal conduit formation with a right mid abdominal urostomy. Mild right and moderate left hydronephrosis with delayed left renal cortical enhancement related to changes of obstructive uropathy. Superimposed mild left periureteric inflammatory stranding and marked urothelial enhancement suggesting superimposed infection. Correlation with  urinalysis and urine culture may be helpful. 3. Cholelithiasis. 4. Large hiatal hernia. 5. Scrotal wall thickening with punctate gas within the left hemiscrotum, likely postsurgical in nature given the history of recent hydrocelectomy. Small left varicocele noted. 6.  Aortic Atherosclerosis (ICD10-I70.0). Electronically Signed   By: Fidela Salisbury M.D.   On: 03/29/2022 04:22   US SCROTUM W/DOPPLER  Result Date: 03/29/2022 CLINICAL DATA:  Left testicular pain, status post left hydrocelectomy EXAM: SCROTAL ULTRASOUND DOPPLER ULTRASOUND OF THE TESTICLES TECHNIQUE: Complete ultrasound examination of the testicles, epididymis, and other scrotal structures was performed. Color and spectral Doppler ultrasound were also utilized to evaluate blood flow to the testicles. COMPARISON:  None Available. FINDINGS: Right testicle Measurements: 3.6 x 2.4 x 2.4 cm. Normal echogenicity and echotexture. Normal color flow vascularity. No mass or microlithiasis visualized. Left testicle Measurements: 4.4 x 2.5 x 2.7 cm. There is diffuse mild parenchymal edema of the left testis. Color flow vascularity is normal. No mass or microlithiasis visualized. Right epididymis:  Normal in size and appearance. Left epididymis: The left epididymis is markedly thickened and hypervascular, best appreciated on image # 50 and cine sequence # 2 suggesting changes of acute epididymitis. There is and echogenic linear shadowing structure within the left hemiscrotum adjacent to the epididymis demonstrating "dirty shadowing" which may represent a small amount of gas within the left hemiscrotum, less likely a postsurgical implant. Hydrocele:  Trace right hydrocele noted Varicocele:  None visualized. Pulsed Doppler interrogation of both testes demonstrates normal low resistance arterial and venous waveforms bilaterally. Other: There is marked scrotal wall thickening and hypervascularity noted, asymmetrically more severe involving the left hemiscrotum,  possibly postsurgical in nature though a superimposed acute inflammatory process such as cellulitis could appear similarly. IMPRESSION: 1. Findings most consistent with acute left epididymo-orchitis. 2. Marked left scrotal wall thickening and hypervascularity, asymmetrically more severe involving the left hemiscrotum, possibly postsurgical in nature though a superimposed acute inflammatory process such as cellulitis could appear similarly. 3. Linear echogenic shadowing structure within the left hemiscrotum adjacent to the epididymis demonstrating ultrasonographic shadowing which may represent a small amount of gas within the left hemiscrotum, less likely a postsurgical implant. 4. No evidence of testicular infarction Electronically Signed   By: Fidela Salisbury M.D.   On: 03/29/2022 03:59     PROCEDURES:  Critical Care performed: Yes, see critical care procedure note(s)   CRITICAL CARE Performed by: Cyril Mourning Viyaan Champine   Total critical care  time: 65 minutes  Critical care time was exclusive of separately billable procedures and treating other patients.  Critical care was necessary to treat or prevent imminent or life-threatening deterioration.  Critical care was time spent personally by me on the following activities: development of treatment plan with patient and/or surrogate as well as nursing, discussions with consultants, evaluation of patient's response to treatment, examination of patient, obtaining history from patient or surrogate, ordering and performing treatments and interventions, ordering and review of laboratory studies, ordering and review of radiographic studies, pulse oximetry and re-evaluation of patient's condition.   Marland Kitchen1-3 Lead EKG Interpretation  Performed by: Iris Tatsch, Delice Bison, DO Authorized by: Ariyanah Aguado, Delice Bison, DO     Interpretation: normal     ECG rate:  92   ECG rate assessment: normal     Rhythm: sinus rhythm     Ectopy: none     Conduction: normal       IMPRESSION /  MDM / ASSESSMENT AND PLAN / ED COURSE  I reviewed the triage vital signs and the nursing notes.    Patient here with left lower quadrant abdominal pain, left testicular pain after hydrocelectomy at [facility] 2 weeks ago.  The patient is on the cardiac monitor to evaluate for evidence of arrhythmia and/or significant heart rate changes.   DIFFERENTIAL DIAGNOSIS (includes but not limited to):   Scrotal cellulitis, epididymitis, orchitis, torsion, diverticulitis, colitis, postoperative pain, hematoma, seroma   Patient's presentation is most consistent with acute presentation with potential threat to life or bodily function.   PLAN: We will obtain CBC, CMP, lipase, urinalysis, urine culture, scrotal Doppler, CT of the abdomen pelvis.  Will give IV fluids, pain and nausea medicine.   MEDICATIONS GIVEN IN ED: Medications  0.9 %  sodium chloride infusion ( Intravenous New Bag/Given 03/29/22 0344)  vancomycin (VANCOREADY) IVPB 2000 mg/400 mL (2,000 mg Intravenous New Bag/Given 03/29/22 0415)  lactated ringers bolus 1,000 mL (1,000 mLs Intravenous New Bag/Given 03/29/22 0422)  fentaNYL (SUBLIMAZE) injection 50 mcg (50 mcg Intravenous Given 03/29/22 0152)  ondansetron (ZOFRAN) injection 4 mg (4 mg Intravenous Given 03/29/22 0150)  iohexol (OMNIPAQUE) 300 MG/ML solution 100 mL (100 mLs Intravenous Contrast Given 03/29/22 0333)  ceFEPIme (MAXIPIME) 2 g in sodium chloride 0.9 % 100 mL IVPB (0 g Intravenous Stopped 03/29/22 0415)  acetaminophen (TYLENOL) tablet 1,000 mg (1,000 mg Oral Given 03/29/22 0326)  HYDROmorphone (DILAUDID) injection 0.5 mg (0.5 mg Intravenous Given 03/29/22 0323)     ED COURSE: Patient has now spiked a temperature of 101.9 rectally.  Will give Tylenol.  Still in significant pain after fentanyl.  Will give Dilaudid.  Will activate code sepsis and obtain cultures.  Will give vancomycin, cefepime.  Will add on lactic, procalcitonin.    Patient's lactic is negative.   Procalcitonin negative.  Patient's blood pressures continue to be stable.  He is not in septic shock and does not need 30 mL/kg IV fluid bolus at this time.  We will continue hydration.    Ultrasound reviewed and interpreted by myself and the radiologist and is concerning for scrotal cellulitis and also epididymo orchitis of the left testicle.  There is gas noted on ultrasound but I suspect this is likely from where the Penrose drain was in place and has now come out rather than from a necrotizing fasciitis/Fournier's gangrene.     CT scan reviewed and interpreted by myself and is concerning for acute perforated sigmoid diverticulitis without abscess or free air.  He has a  known peritoneal exam.  He also appears to be developing pyelonephritis and urine appears infected versus contaminant from his ileal conduit.  Culture is pending.   Discussed with patient and wife at the bedside at length.  His recent surgery was with [facility] but he states that he lives here in this area and his wife states there is no way that she would be able to drive that far to visit him while in the hospital and they were requesting admission here.  Given none of his urologic findings seem to be surgical in nature, I feel he can be admitted here for continued IV antibiotics and pain control.  Will discuss with our urologist.   CONSULTS: Discussed with Dr. Diamantina Providence on-call for urology.  Appreciate his help.  Patient is well-known to him.  He agrees with vancomycin and cefepime and will see patient in the morning.      Consulted and discussed patient's case with hospitalist, Dr. Damita Dunnings.  I have recommended admission and consulting physician agrees and will place admission orders.  Patient (and family if present) agree with this plan.  Appreciate hospitalist help.  I reviewed all nursing notes, vitals, pertinent previous records.  All labs, EKGs, imaging ordered have been independently reviewed and interpreted by  myself.\   OUTSIDE RECORDS REVIEWED: Reviewed patient's recent urology notes.       FINAL CLINICAL IMPRESSION(S) / ED DIAGNOSES   Final diagnoses:  LLQ abdominal pain  Fever in adult  Cellulitis, scrotum  Epididymoorchitis  Pyelonephritis  Perforation of sigmoid colon due to diverticulitis     Rx / DC Orders   ED Discharge Orders     None        Note:  This document was prepared using Dragon voice recognition software and may include unintentional dictation errors.   Theotis Gerdeman, Delice Bison, DO 03/29/22 [id number]

## 2022-03-29 NOTE — Progress Notes (Signed)
PHARMACY -  BRIEF ANTIBIOTIC NOTE   Pharmacy has received consult(s) for Cefepime & Vancomycin from an ED provider.  The patient's profile has been reviewed for ht/wt/allergies/indication/available labs.    One time order(s) placed for Cefepime 2 gm & Vancomycin 2 gm per pt wt: 83 kg.  Further antibiotics/pharmacy consults should be ordered by admitting physician if indicated.                       Thank you, Renda Rolls, PharmD, [facility] 03/29/2022 2:44 AM

## 2022-03-29 NOTE — ED Triage Notes (Signed)
Pt to triage via w/c with no distress noted; c/o left lower abd pain since last night accomp by chills; recent hydocectomy on left testicle

## 2022-03-29 NOTE — Assessment & Plan Note (Signed)
Continue CPAP.  

## 2022-03-29 NOTE — Consult Note (Signed)
Urology Consult   I have been asked to see the patient by Dr. Damita Dunnings, for evaluation and management of possible infection in the setting of recent hydrocelectomy, chronic bilateral hydronephrosis with ileal conduit.  Chief Complaint: Left lower abdominal pain  HPI:  Donald Barry is a 76 y.o. male with complex urologic history.  He has been taking care of by Dr. Amalia Hailey at [facility].  He has a history of severe interstitial cystitis and ultimately underwent open cystectomy with urinary diversion with ileal conduit in August 2022.  He also recently underwent a left hydrocelectomy by Dr. Amalia Hailey on 03/15/2022.  He presented to the ER overnight with severe left lower quadrant abdominal pain and left scrotal pain.  His left scrotal drain fell out in the shower yesterday.  Febrile to 101.9 on admission.  CT was concerning for perforated diverticulitis, left ureteral enhancement and stranding with stable bilateral hydronephrosis, as well as suspected scrotal cellulitis on scrotal ultrasound.  He reports he feels much better after pain medications.  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 SCCis   Squamous cell skin cancer 07/2017   Resected from scalp.    Surgical History: Past Surgical History:  Procedure Laterality Date   BACK  SURGERY  2008   L4, L5   COLONOSCOPY     cystectomy with prostatectomy ileal conduit     CYSTO WITH HYDRODISTENSION N/A 06/17/2019   Procedure: CYSTOSCOPY/HYDRODISTENSION;  Surgeon: Billey Co, MD;  Location: [facility];  Service: Urology;  Laterality: N/A;   CYSTOSCOPY WITH BIOPSY N/A 07/17/2018   Procedure: CYSTOSCOPY WITH BLADDER BIOPSY;  Surgeon: Billey Co, MD;  Location: [facility];  Service: Urology;  Laterality: N/A;   CYSTOSCOPY WITH BIOPSY N/A 06/17/2019   Procedure: CYSTOSCOPY WITH BLADDER  BIOPSY;  Surgeon: Billey Co, MD;  Location: [facility];  Service: Urology;  Laterality: N/A;   CYSTOSCOPY WITH FULGERATION N/A 07/17/2018   Procedure: CYSTOSCOPY WITH FULGERATION;  Surgeon: Billey Co, MD;  Location: [facility];  Service: Urology;  Laterality: N/A;   CYSTOSCOPY WITH FULGERATION N/A 06/17/2019   Procedure: CYSTOSCOPY WITH FULGERATION;  Surgeon: Billey Co, MD;  Location: [facility];  Service: Urology;  Laterality: N/A;   ESOPHAGOGASTRODUODENOSCOPY     HERNIA REPAIR  [id number]   umbilical   STOMACH SURGERY     FUNDIC GLAND POLYP     Family History: Family History  Problem Relation Age of Onset   Hypertension Mother    Heart disease Father    GER disease Sister    GER disease Brother    Prostate cancer Neg Hx    Kidney cancer Neg Hx     Social History:  reports that he has never smoked. He has  never used smokeless tobacco. He reports that he does not drink alcohol and does not use drugs.   Physical Exam: BP 128/78 (BP Location: Right Arm)   Pulse 74   Temp 98.3 F (36.8 C)   Resp 18   Ht 6' (1.829 m)   Wt 87.4 kg   SpO2 100%   BMI 26.13 kg/m    Constitutional:  Alert and oriented, No acute distress. Cardiovascular: No clubbing, cyanosis, or edema. Respiratory: Normal respiratory effort, no increased work of breathing. GI: Ileal conduit in right lower quadrant with yellow urine, abdomen soft, nontender, nondistended, no peritoneal  signs GU: Phallus with patent meatus, no lesions, changes in the left hemiscrotum from recent hydrocelectomy with some erythema and edema, tenderness especially in the left upper scrotum, no drainage or purulence from incision, sutures remain intact, no evidence of crepitus, induration, or drainable abscess   Laboratory Data: Reviewed in epic  Pertinent Imaging: I have personally reviewed the scrotal ultrasound and CT.  CT notable for perforated diverticulitis, stable bilateral hydronephrosis with some left ureteral enhancement, no evidence of nephrolithiasis.  Scrotal ultrasound with scrotal cellulitis and no definitive fluid collection or abscess  Assessment & Plan:   Complex 76 year old male with history of severe interstitial cystitis managed with an open cystectomy and urinary diversion at [facility] in August 2022, currently admitted with fevers and left lower abdominal and left scrotal pain with recent left hydrocelectomy at [facility] on 03/15/2022.  CT also shows perforated diverticulitis as well as some ureteral enhancement on the left side, urinalysis, as expected, looks infected from the ileal conduit.  No evidence of ureteral stones, hydronephrosis is at baseline from prior CT in April 2023, and is expected in the setting of refluxing ureteral anastomoses.  No evidence of drainable fluid collection in the scrotum, findings more consistent with scrotal cellulitis.  Recommendations: -No plan for surgical intervention from a urology perspective -Agree with broad-spectrum antibiotics with possible sources including perforated diverticulitis, left scrotal cellulitis, or UTI/pyelonephritis -Recommend holding Eliquis while hospitalized in case develops need for intervention with either abdominal drain for diverticulitis or I&D of left scrotal hydrocelectomy, but do not anticipate any need for urologic surgical intervention -Snug fitting underwear, elevate scrotum, icing as  needed for scrotal pain  Billey Co, MD  Total time spent on the floor was 60 minutes, with greater than 50% spent in counseling and coordination of care with the patient regarding history of ileal conduit, CT findings, left scrotal cellulitis after hydrocelectomy, as well as perforated diverticulitis on CT.  [facility] [address], [place], [place] [address] [phone]

## 2022-03-29 NOTE — Progress Notes (Signed)
Patient arrived to room 155 in stable condition. Aox4. Reports 4/10 abdominal pain. Pain medication offered but pt refused. Denies nausea. Plan of care reviewed with patient and spouse. NPO. Room/unit orientation completed. Call bell within reach.

## 2022-03-29 NOTE — ED Notes (Signed)
Pt taken via stretcher to room 155 by Juliane Lack.

## 2022-03-29 NOTE — Assessment & Plan Note (Signed)
Sepsis criteria includes fever, tachycardia, leukocytosis and source of infection Continue cefepime for suspected urinary source as well as GI Sepsis fluids Treat etiologies as outlined below

## 2022-03-29 NOTE — H&P (Signed)
History and Physical    Patient: Donald Barry YTK:[id number] DOB: [birth year] DOA: 03/29/2022 DOS: the patient was seen and examined on 03/29/2022 PCP: Derinda Late, MD  Patient coming from: Home  Chief Complaint:  Chief Complaint  Patient presents with   Abdominal Pain    HPI: Donald Barry is a 76 y.o. 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  He denies febrile, nausea vomiting or diarrhea.  He has good urostomy output. ED course and data review: Tmax 101.9 with pulse 102 and otherwise normal vitals.  WBC 11,800 with normal lactic acid of 1.4 and procalcitonin less than 0.10.  CMP unremarkable.  Urinalysis consistent with UTI.  EKG, personally viewed and interpreted shows sinus at 86 with nonspecific ST-T wave changes. Imaging included CT abdomen and pelvis and scrotal ultrasound which was significant for  perforated diverticulitis and possible pyelonephritis as outlined below:   CT abdomen and pelvis IMPRESSION: 1. Acute, perforated sigmoid diverticulitis. No evidence of obstruction, intra-abdominal abscess formation, or gross free intraperitoneal gas. 2. Surgical changes of cystectomy and ileal conduit formation with a right mid abdominal urostomy. Mild right and moderate left hydronephrosis with delayed left renal cortical enhancement related to changes of obstructive uropathy. Superimposed mild left periureteric inflammatory stranding and marked urothelial enhancement suggesting superimposed infection. Correlation with urinalysis and urine culture may be helpful. 3. Cholelithiasis. 4. Large hiatal hernia. 5. Scrotal wall thickening with punctate gas within the left hemiscrotum, likely  postsurgical in nature given the history of recent hydrocelectomy. Small left varicocele noted. 6.  Aortic Atherosclerosis (ICD10-I70.0).    Patient started on cefepime and vancomycin and sepsis fluids. : Patient did not want to be transferred back to [facility] where he had his procedure as he lives here locally.  The ED provider subsequently spoke with urologist, Dr. Diamantina Providence who recommended admission here as treatment is likely nonsurgical and was in agreement with the above antibiotics.  Hospitalist consulted for admission.   Review of Systems: As mentioned in the history of present illness. All other systems reviewed and are negative.  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 SCCis   Squamous cell skin cancer 07/2017   Resected from scalp.   Past Surgical History:  Procedure Laterality Date   BACK SURGERY  2008   L4, L5   COLONOSCOPY     cystectomy with prostatectomy ileal conduit     CYSTO WITH HYDRODISTENSION N/A 06/17/2019   Procedure: CYSTOSCOPY/HYDRODISTENSION;  Surgeon: Billey Co, MD;  Location: [facility] ORS;  Service: Urology;  Laterality: N/A;   CYSTOSCOPY  WITH BIOPSY N/A 07/17/2018   Procedure: CYSTOSCOPY WITH BLADDER BIOPSY;  Surgeon: Billey Co, MD;  Location: [facility] ORS;  Service: Urology;  Laterality: N/A;   CYSTOSCOPY WITH BIOPSY  N/A 06/17/2019   Procedure: CYSTOSCOPY WITH BLADDER  BIOPSY;  Surgeon: Billey Co, MD;  Location: [facility] ORS;  Service: Urology;  Laterality: N/A;   CYSTOSCOPY WITH FULGERATION N/A 07/17/2018   Procedure: CYSTOSCOPY WITH FULGERATION;  Surgeon: Billey Co, MD;  Location: [facility] OR[facility];  Service: Urology;  Laterality: N/A;   CYSTOSCOPY WITH FULGERATION N/A 06/17/2019   Procedure: CYSTOSCOPY WITH FULGERATION;  Surgeon: Billey Co, MD;  Location: [facility] OR[facility];  Service: Urology;  Laterality: N/A;   ESOPHAGOGASTRODUODENOSCOPY     HERNIA REPAIR  [id number]   umbilical   STOMACH SURGERY     FUNDIC GLAND POLYP   Social History:  reports that he has never smoked. He has never used smokeless tobacco. He reports that he does not drink alcohol and does not use drugs.  Allergies  Allergen Reactions   Tape Dermatitis    Skin blistered- steri strip  Skin blistered   Wound Dressing Adhesive Dermatitis    Skin blistered- steri strip   Shellfish Allergy Diarrhea and Nausea And Vomiting    Shrimp    Shellfish-Derived Products Diarrhea, Nausea And Vomiting and Nausea Only   Onion Other (See Comments)    diarrhea Diarrhea- verified avoids all onion ingredient (including onion powder). MM, RD 12/19/20   Other Nausea And Vomiting    general anesthesia   Codeine Nausea Only    Family History  Problem Relation Age of Onset   Hypertension Mother    Heart disease Father    GER disease Sister    GER disease Brother    Prostate cancer Neg Hx    Kidney cancer Neg Hx     Prior to Admission medications   Medication Sig Start Date End Date Taking? Authorizing Provider  apixaban (ELIQUIS) 5 MG TABS tablet Take 1 tablet (5 mg total) by mouth 2 (two) times daily. 02/07/22   Dunn, Areta Haber, PA-C  atorvastatin (LIPITOR) 10 MG tablet Take 1 tablet by mouth daily. 08/06/19   [provider]  Coenzyme Q10 (COQ10) 100 MG CAPS Take 1 capsule by mouth daily.    [provider]  ferrous sulfate 325  (65 FE) MG EC tablet TAKE 1 TABLET BY MOUTH EVERY DAY 12/05/21   Sindy Guadeloupe, MD  fluticasone (FLONASE) 50 MCG/ACT nasal spray Place 1 spray into both nostrils daily.    [provider]  guaiFENesin (MUCINEX) 600 MG 12 hr tablet Take by mouth 2 (two) times daily.    [provider]  metoprolol tartrate (LOPRESSOR) 25 MG tablet Take 0.5 tablets (12.5 mg total) by mouth 2 (two) times daily. 06/13/21   Delman Kitten, MD  Multiple Vitamin (MULTIVITAMIN WITH MINERALS) TABS tablet Take 1 tablet by mouth daily. Iron Free    [provider]  NON FORMULARY Place 1 Dose/kg into the nose daily as needed. Netty pot for sinuses    [provider]  omeprazole (PRILOSEC) 40 MG capsule Take 1 capsule by mouth daily. 10/25/19   [provider]  traZODone (DESYREL) 50 MG tablet Take 50 mg by mouth at bedtime as needed. 12/20/21   [provider]    Physical Exam: Vitals:   03/29/22 0130 03/29/22 0330 03/29/22 0400 03/29/22 0430  BP: 124/75 115/77 123/75 111/74  Pulse: 84 (!) 102 92 90  Resp:  Temp: (!) 101.9 F (38.8 C)  98.5 F (36.9 C)   TempSrc: Rectal  Oral   SpO2: 99% 98% 97% 97%  Weight:      Height:       Physical Exam Vitals and nursing note reviewed.  Constitutional:      General: He is not in acute distress. HENT:     Head: Normocephalic and atraumatic.  Cardiovascular:     Rate and Rhythm: Regular rhythm. Tachycardia present.     Heart sounds: Normal heart sounds.  Pulmonary:     Effort: Pulmonary effort is normal.     Breath sounds: Normal breath sounds.  Abdominal:     Palpations: Abdomen is soft.     Tenderness: There is abdominal tenderness in the left lower quadrant.     Comments: Urostomy RLQ  Neurological:     Mental Status: Mental status is at baseline.     Labs on Admission: I have personally reviewed following labs and imaging studies  CBC: Recent Labs  Lab 03/29/22 0037  WBC 11.8*  NEUTROABS 9.3*  HGB 13.4   HCT 40.2  MCV 88.9  PLT 326   Basic Metabolic Panel: Recent Labs  Lab 03/29/22 0037  NA 140  K 4.3  CL 107  CO2 25  GLUCOSE 104*  BUN 23  CREATININE 1.06  CALCIUM 9.2   GFR: Estimated Creatinine Clearance: 66.1 mL/min (by C-G formula based on SCr of 1.06 mg/dL). Liver Function Tests: Recent Labs  Lab 03/29/22 0037  AST 21  ALT 15  ALKPHOS 35*  BILITOT 0.6  PROT 7.5  ALBUMIN 3.7   Recent Labs  Lab 03/29/22 0037  LIPASE 43   No results for input(s): "AMMONIA" in the last 168 hours. Coagulation Profile: No results for input(s): "INR", "PROTIME" in the last 168 hours. Cardiac Enzymes: No results for input(s): "CKTOTAL", "CKMB", "CKMBINDEX", "TROPONINI" in the last 168 hours. BNP (last 3 results) No results for input(s): "PROBNP" in the last 8760 hours. HbA1C: No results for input(s): "HGBA1C" in the last 72 hours. CBG: No results for input(s): "GLUCAP" in the last 168 hours. Lipid Profile: No results for input(s): "CHOL", "HDL", "LDLCALC", "TRIG", "CHOLHDL", "LDLDIRECT" in the last 72 hours. Thyroid Function Tests: No results for input(s): "TSH", "T4TOTAL", "FREET4", "T3FREE", "THYROIDAB" in the last 72 hours. Anemia Panel: No results for input(s): "VITAMINB12", "FOLATE", "FERRITIN", "TIBC", "IRON", "RETICCTPCT" in the last 72 hours. Urine analysis:    Component Value Date/Time   COLORURINE YELLOW (A) 03/29/2022 0309   APPEARANCEUR CLOUDY (A) 03/29/2022 0309   APPEARANCEUR Hazy (A) 06/09/2019 1324   LABSPEC 1.010 03/29/2022 0309   PHURINE 6.0 03/29/2022 0309   GLUCOSEU NEGATIVE 03/29/2022 0309   HGBUR SMALL (A) 03/29/2022 0309   BILIRUBINUR NEGATIVE 03/29/2022 0309   BILIRUBINUR Negative 06/09/2019 1324   KETONESUR NEGATIVE 03/29/2022 0309   PROTEINUR NEGATIVE 03/29/2022 0309   NITRITE NEGATIVE 03/29/2022 0309   LEUKOCYTESUR LARGE (A) 03/29/2022 0309    Radiological Exams on Admission: CT ABDOMEN PELVIS W CONTRAST  Result Date:  03/29/2022 CLINICAL DATA:  Left lower quadrant abdominal pain, chills EXAM: CT ABDOMEN AND PELVIS WITH CONTRAST TECHNIQUE: Multidetector CT imaging of the abdomen and pelvis was performed using the standard protocol following bolus administration of intravenous contrast. RADIATION DOSE REDUCTION: This exam was performed according to the departmental dose-optimization program which includes automated exposure control, adjustment of the mA and/or kV according to patient size and/or use of iterative reconstruction technique. CONTRAST:  171m OMNIPAQUE IOHEXOL 300 MG/ML  SOLN COMPARISON:  08/10/2021 FINDINGS: Lower chest: Bibasilar atelectasis. Cardiac size within normal limits. Large hiatal hernia. Hepatobiliary: Cholelithiasis without pericholecystic inflammatory change. Tiny probable cyst within the right hepatic dome. Liver otherwise unremarkable. No intra or extrahepatic biliary ductal dilation. Pancreas: Unremarkable Spleen: Unremarkable Adrenals/Urinary Tract: The adrenal glands are unremarkable. Surgical changes of cystectomy and ileal conduit formation are identified with a right mid abdominal urostomy identified. Small fat containing parastomal hernia. The kidneys are normal in size and position. There is mild right and moderate left hydronephrosis with delayed left renal cortical enhancement noted related to changes of obstructive uropathy. There is, additionally, superimposed mild left periureteric inflammatory stranding and marked urothelial enhancement suggesting superimposed infection. Ureteral dilation bilaterally extends to the level of the ureteral anastomoses. Immediately adjacent to the distal left ureter are extensive inflammatory changes and punctate foci of extraluminal gas in keeping with changes of acute diverticulitis described below. No perinephric fluid collections are identified. No intrarenal or ureteral calculi are seen. The conduit itself is decompressed. Stomach/Bowel: Moderate sigmoid  diverticulosis. There is focal circumferential colonic wall thickening and extensive pericolonic inflammatory stranding with punctate foci of extraluminal gas adjacent to the mid sigmoid colon in keeping with changes of acute, perforated sigmoid diverticulitis. No evidence of obstruction. No gross free intraperitoneal gas. No free intraperitoneal fluid or loculated intra-abdominal fluid collections. The stomach, small bowel, and large bowel are otherwise unremarkable. Appendix normal. Vascular/Lymphatic: Mild aortoiliac atherosclerotic calcification. No aortic aneurysm. No pathologic adenopathy within the abdomen and pelvis. Reproductive: The prostate gland and seminal vesicles are unremarkable. There is scrotal wall thickening noted. There is punctate gas within the left hemiscrotum, likely postsurgical in nature given the history of recent hydrocelectomy. Small left varicocele noted. Other: None Musculoskeletal: Osseous structures are age-appropriate. No acute bone abnormality. IMPRESSION: 1. Acute, perforated sigmoid diverticulitis. No evidence of obstruction, intra-abdominal abscess formation, or gross free intraperitoneal gas. 2. Surgical changes of cystectomy and ileal conduit formation with a right mid abdominal urostomy. Mild right and moderate left hydronephrosis with delayed left renal cortical enhancement related to changes of obstructive uropathy. Superimposed mild left periureteric inflammatory stranding and marked urothelial enhancement suggesting superimposed infection. Correlation with urinalysis and urine culture may be helpful. 3. Cholelithiasis. 4. Large hiatal hernia. 5. Scrotal wall thickening with punctate gas within the left hemiscrotum, likely postsurgical in nature given the history of recent hydrocelectomy. Small left varicocele noted. 6.  Aortic Atherosclerosis (ICD10-I70.0). Electronically Signed   By: Fidela Salisbury M.D.   On: 03/29/2022 04:22   US SCROTUM W/DOPPLER  Result Date:  03/29/2022 CLINICAL DATA:  Left testicular pain, status post left hydrocelectomy EXAM: SCROTAL ULTRASOUND DOPPLER ULTRASOUND OF THE TESTICLES TECHNIQUE: Complete ultrasound examination of the testicles, epididymis, and other scrotal structures was performed. Color and spectral Doppler ultrasound were also utilized to evaluate blood flow to the testicles. COMPARISON:  None Available. FINDINGS: Right testicle Measurements: 3.6 x 2.4 x 2.4 cm. Normal echogenicity and echotexture. Normal color flow vascularity. No mass or microlithiasis visualized. Left testicle Measurements: 4.4 x 2.5 x 2.7 cm. There is diffuse mild parenchymal edema of the left testis. Color flow vascularity is normal. No mass or microlithiasis visualized. Right epididymis:  Normal in size and appearance. Left epididymis: The left epididymis is markedly thickened and hypervascular, best appreciated on image # 50 and cine sequence # 2 suggesting changes of acute epididymitis. There is and echogenic linear shadowing structure within the left hemiscrotum adjacent to the epididymis demonstrating "dirty shadowing" which may represent a small amount  of gas within the left hemiscrotum, less likely a postsurgical implant. Hydrocele:  Trace right hydrocele noted Varicocele:  None visualized. Pulsed Doppler interrogation of both testes demonstrates normal low resistance arterial and venous waveforms bilaterally. Other: There is marked scrotal wall thickening and hypervascularity noted, asymmetrically more severe involving the left hemiscrotum, possibly postsurgical in nature though a superimposed acute inflammatory process such as cellulitis could appear similarly. IMPRESSION: 1. Findings most consistent with acute left epididymo-orchitis. 2. Marked left scrotal wall thickening and hypervascularity, asymmetrically more severe involving the left hemiscrotum, possibly postsurgical in nature though a superimposed acute inflammatory process such as cellulitis  could appear similarly. 3. Linear echogenic shadowing structure within the left hemiscrotum adjacent to the epididymis demonstrating ultrasonographic shadowing which may represent a small amount of gas within the left hemiscrotum, less likely a postsurgical implant. 4. No evidence of testicular infarction Electronically Signed   By: Fidela Salisbury M.D.   On: 03/29/2022 03:59     Data Reviewed: Relevant notes from primary care and specialist visits, past discharge summaries as available in EHR, including Care Everywhere. Prior diagnostic testing as pertinent to current admission diagnoses Updated medications and problem lists for reconciliation ED course, including vitals, labs, imaging, treatment and response to treatment Triage notes, nursing and pharmacy notes and ED provider's notes Notable results as noted in HPI   Assessment and Plan: * Sepsis ([place]) Sepsis criteria includes fever, tachycardia, leukocytosis and source of infection Continue cefepime for suspected urinary source as well as GI Sepsis fluids Treat etiologies as outlined below  Perforation of sigmoid colon due to diverticulitis CT showing perforated sigmoid diverticulitis without obstruction, intra-abdominal abscess formation or gross free intraperitoneal gas Abdominal exam is nonacute but surgical consult requested Continue cefepime Surgery consulted Keep n.p.o. Pain management and supportive care  Complicated UTI (urinary tract infection) S/p hydrocelectomy 03/15/2022 Bilateral hydronephrosis History of cystectomy with ileal conduit and urostomy 12/2020 secondary to end-stage interstitial cystitis Urinalysis consistent with UTI and CT abdomen and pelvis showing mild left periureteric inflammatory stranding. Cefepime as above Urology consulted Follow cultures  Paroxysmal atrial fibrillation (HCC) Chronic anticoagulation Continue metoprolol  Will hold apixaban in case of surgical procedure  OSA on CPAP Continue  CPAP        DVT prophylaxis: SCD  Consults: Surgery, Dr. Hampton Abbot, urology, Dr. Diamantina Providence  Advance Care Planning:   Code Status: Prior   Family Communication: none  Disposition Plan: Back to previous home environment  Severity of Illness: The appropriate patient status for this patient is INPATIENT. Inpatient status is judged to be reasonable and necessary in order to provide the required intensity of service to ensure the patient's safety. The patient's presenting symptoms, physical exam findings, and initial radiographic and laboratory data in the context of their chronic comorbidities is felt to place them at high risk for further clinical deterioration. Furthermore, it is not anticipated that the patient will be medically stable for discharge from the hospital within 2 midnights of admission.   * I certify that at the point of admission it is my clinical judgment that the patient will require inpatient hospital care spanning beyond 2 midnights from the point of admission due to high intensity of service, high risk for further deterioration and high frequency of surveillance required.*  Author: Athena Masse, MD 03/29/2022 5:01 AM  For on call review [email or web address].

## 2022-03-29 NOTE — Sepsis Progress Note (Signed)
Elink monitoring for the code sepsis protocol.  

## 2022-03-29 NOTE — TOC Progression Note (Signed)
Transition of Care [facility]) - Progression Note    Patient Details  Name: Donald Barry MRN: [id number] Date of Birth: January 23, 1946  Transition of Care [facility]) CM/SW [place], RN Phone Number: 03/29/2022, 9:16 AM  Clinical Narrative:     Transition of Care [facility]) Screening Note   Patient Details  Name: Donald Barry Date of Birth: 03-02-1946   Transition of Care [facility]) CM/SW Contact:    Conception Oms, RN Phone Number: 03/29/2022, 9:16 AM  From home with his wife [facility]   Transition of Care Department [facility]) has reviewed patient and no TOC needs have been identified at this time. We will continue to monitor patient advancement through interdisciplinary progression rounds. If new patient transition needs arise, please place a TOC consult.          Expected Discharge Plan and Services                                                 Social Determinants of Health (SDOH) Interventions    Readmission Risk Interventions     No data to display

## 2022-03-29 NOTE — Hospital Course (Addendum)
Donald Barry is a 76 y.o. 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  Patient had temperature 101.9, pulse rate 102, leukocytosis 11800.   CT scan abdomen/pelvis showed acute sigmoid diverticulitis with evidence of perforation.  Postsurgical changes with ilieoconduit and a cystectomy.  Left hydronephrosis with a mild periureteric inflammation stranding suggest possible pyelonephritis. Patient has been evaluated by urology, no intervention needed.  Patient also has been reevaluated by general surgery, recommend antibiotics and clinical follow-up.  Patient will be placed on n.p.o. in case needed surgery. Patient is treated with Flagyl and cefepime.  Blood culture came back no growth.  Urine culture with Klebsiella.  Condition gradually improving, started liquid diet 2 days ago, soft diet since yesterday.  Patient tolerated well without any significant abdominal pain.  Patient has been followed by general surgery, no need for intervention.  Cleared for discharge today.  Patient will be followed by general surgery and PCP as outpatient.

## 2022-03-29 NOTE — Consult Note (Signed)
[facility] SURGICAL CONSULTATION NOTE (initial) - cpt: [id number]   HISTORY OF PRESENT ILLNESS (HPI):  76 y.o. male presented to [facility] ED overnight for evaluation of abdominal pain. Patient reports around a 2 day history of LLQ abdominal pain. Thought this was "gas pain" at first. No associated fever, chills, nausea, emesis. He is able to pass flatus and BM have been normal. Also endorses left scrotal pain but is s/p left hydrocelectomy on 11/03 with Dr Amalia Hailey, MD [facility]). He does believe he has a history of diverticulitis in the past. Last colonoscopy was reportedly 2 years ago, but I can not find record of this. 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 Work up in the ED revealed a mild leukocytosis to 11.8K, CMP was reassuring, and lactic acid level was normal at 1.4. He did undergo CT abdomen/Pelvis which was concerning for diverticulitis. He was started on Cefepime, Flagyl, and vancomycin. He was admitted to the hospitalist. Urology consult also obtained.   Surgery is consulted by hospitalist physician Dr. Judd Gaudier, MD in this context for evaluation and management of diverticulitis.  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 KA-type   Squamous cell carcinoma of skin 12/22/2019    Left vertex scalp. SCCis   Squamous cell skin cancer 07/2017   Resected from scalp.     PAST SURGICAL HISTORY [facility]):  Past Surgical History:  Procedure Laterality Date   BACK SURGERY  2008   L4, L5   COLONOSCOPY     cystectomy with prostatectomy ileal conduit     CYSTO WITH HYDRODISTENSION N/A 06/17/2019   Procedure: CYSTOSCOPY/HYDRODISTENSION;  Surgeon: Billey Co, MD;  Location: [facility] ORS;  Service: Urology;  Laterality: N/A;   CYSTOSCOPY WITH BIOPSY N/A 07/17/2018   Procedure: CYSTOSCOPY WITH BLADDER BIOPSY;  Surgeon: Billey Co, MD;  Location: [facility] ORS;  Service: Urology;  Laterality: N/A;   CYSTOSCOPY WITH BIOPSY N/A 06/17/2019   Procedure: CYSTOSCOPY WITH BLADDER  BIOPSY;  Surgeon: Billey Co, MD;  Location: [facility] ORS;  Service: Urology;  Laterality: N/A;   CYSTOSCOPY WITH FULGERATION N/A 07/17/2018   Procedure: CYSTOSCOPY WITH FULGERATION;  Surgeon: Billey Co, MD;  Location: [facility] ORS;  Service: Urology;  Laterality: N/A;   CYSTOSCOPY WITH FULGERATION N/A 06/17/2019   Procedure: CYSTOSCOPY WITH FULGERATION;  Surgeon: Billey Co, MD;  Location: [facility] ORS;  Service: Urology;  Laterality: N/A;   ESOPHAGOGASTRODUODENOSCOPY     HERNIA REPAIR  1601   umbilical   STOMACH SURGERY     FUNDIC GLAND POLYP     MEDICATIONS:  Prior to Admission medications   Medication Sig Start Date End Date Taking? Authorizing Provider  apixaban (ELIQUIS) 5 MG TABS tablet Take 1 tablet (5  mg total) by mouth 2 (two) times daily. 02/07/22  Yes Dunn, Areta Haber, PA-C  atorvastatin (LIPITOR) 10 MG tablet Take 1 tablet by mouth daily. 08/06/19  Yes [provider]  Coenzyme Q10 (COQ10) 100 MG CAPS Take 1 capsule by mouth daily.   Yes [provider]  ferrous sulfate 325 (65 FE) MG EC tablet TAKE 1 TABLET BY MOUTH EVERY DAY 12/05/21  Yes Sindy Guadeloupe, MD  guaiFENesin (MUCINEX) 600 MG 12 hr tablet Take by mouth 2 (two) times daily.   Yes [provider]   HYDROcodone-acetaminophen (NORCO/VICODIN) 5-325 MG tablet Take 1 tablet by mouth every 6 (six) hours as needed. 03/15/22  Yes [provider]  loratadine (CLARITIN) 10 MG tablet Take 1 tablet by mouth daily.   Yes [provider]  methylPREDNISolone (MEDROL DOSEPAK) 4 MG TBPK tablet See admin instructions. 03/27/22 04/02/22 Yes [provider]  metoprolol tartrate (LOPRESSOR) 25 MG tablet Take 0.5 tablets (12.5 mg total) by mouth 2 (two) times daily. 06/13/21  Yes Delman Kitten, MD  Multiple Vitamin (MULTIVITAMIN WITH MINERALS) TABS tablet Take 1 tablet by mouth daily. Iron Free   Yes [provider]  omeprazole (PRILOSEC) 40 MG capsule Take 1 capsule by mouth daily. 10/25/19  Yes [provider]  fluticasone (FLONASE) 50 MCG/ACT nasal spray Place 1 spray into both nostrils daily.    [provider]  NON FORMULARY Place 1 Dose/kg into the nose daily as needed. Netty pot for sinuses    [provider]  traZODone (DESYREL) 50 MG tablet Take 50 mg by mouth at bedtime as needed. 12/20/21   [provider]     ALLERGIES:  Allergies  Allergen Reactions   Tape Dermatitis    Skin blistered- steri strip  Skin blistered   Wound Dressing Adhesive Dermatitis    Skin blistered- steri strip   Shellfish Allergy Diarrhea and Nausea And Vomiting    Shrimp    Shellfish-Derived Products Diarrhea, Nausea And Vomiting and Nausea Only   Onion Other (See Comments)    diarrhea Diarrhea- verified avoids all onion ingredient (including onion powder). MM, RD 12/19/20   Other Nausea And Vomiting    general anesthesia   Codeine Nausea Only     SOCIAL HISTORY:  Social History   Socioeconomic History   Marital status: Married    Spouse name: Not on file   Number of children: Not on file   Years of education: Not on file   Highest education level: Not on file  Occupational History   Not on file  Tobacco Use   Smoking status: Never    Smokeless tobacco: Never  Vaping Use   Vaping Use: Never used  Substance and Sexual Activity   Alcohol use: Never   Drug use: Never   Sexual activity: Yes    Birth control/protection: None  Other Topics Concern   Not on file  Social History Narrative   Not on file   Social Determinants of Health   Financial Resource Strain: Not on file  Food Insecurity: No Food Insecurity (03/29/2022)   Hunger Vital Sign    Worried About Running Out of Food in the Last Year: Never true    Ran Out of Food in the Last Year: Never true  Transportation Needs: No Transportation Needs (03/29/2022)   PRAPARE - Hydrologist (Medical): No    Lack of Transportation (Non-Medical): No  Physical Activity: Not on file  Stress: Not on  file  Social Connections: Not on file  Intimate Partner Violence: Not on file     FAMILY HISTORY:  Family History  Problem Relation Age of Onset   Hypertension Mother    Heart disease Father    GER disease Sister    GER disease Brother    Prostate cancer Neg Hx    Kidney cancer Neg Hx       REVIEW OF SYSTEMS:  Review of Systems  Constitutional:  Negative for chills and fever.  HENT:  Negative for congestion and sore throat.   Respiratory:  Negative for cough and shortness of breath.   Cardiovascular:  Negative for chest pain and palpitations.  Gastrointestinal:  Positive for abdominal pain. Negative for constipation, diarrhea, nausea and vomiting.  Genitourinary:  Negative for dysuria and urgency.       + Scrotal Pain   All other systems reviewed and are negative.   VITAL SIGNS:  Temp:  [97.8 F (36.6 C)-101.9 F (38.8 C)] 98.8 F (37.1 C) (11/17 0759) Pulse Rate:  [72-102] 76 (11/17 0759) Resp:  [16-18] 16 (11/17 0759) BP: (103-131)/(73-81) 103/73 (11/17 0759) SpO2:  [96 %-100 %] 96 % (11/17 0759) Weight:  [83 kg-87.4 kg] 87.4 kg (11/17 0619)     Height: 6' (182.9 cm) Weight: 87.4 kg BMI (Calculated): 26.13   INTAKE/OUTPUT:   11/16 0701 - 11/17 0700 In: -  Out: 800 [Urine:800]  PHYSICAL EXAM:  Physical Exam Vitals and nursing note reviewed. Exam conducted with a chaperone present.  Constitutional:      General: He is not in acute distress.    Appearance: He is well-developed and normal weight. He is not ill-appearing.     Comments: Patient resting comfortably in bed; NAD. Wife at bedside   HENT:     Head: Normocephalic and atraumatic.  Cardiovascular:     Rate and Rhythm: Normal rate.     Heart sounds: Normal heart sounds.  Pulmonary:     Effort: Pulmonary effort is normal. No respiratory distress.  Abdominal:     General: Abdomen is flat. There is no distension.     Palpations: Abdomen is soft.     Tenderness: There is abdominal tenderness in the left lower quadrant. There is no guarding or rebound.     Comments: Abdomen is soft, he is tender in LLQ but this is mild, non-distended, no rebound/guarding. He is certainly not peritonitic. Ileo conduit in RLQ, patent, healthy appearing, yellow urine present   Skin:    General: Skin is warm and dry.  Neurological:     General: No focal deficit present.     Mental Status: He is alert and oriented to person, place, and time.  Psychiatric:        Mood and Affect: Mood normal.        Behavior: Behavior normal.      Labs:     Latest Ref Rng & Units 03/29/2022   12:37 AM 03/01/2022   11:37 AM 02/15/2022   10:37 AM  CBC  WBC 4.0 - 10.5 K/uL 11.8  9.2  6.4   Hemoglobin 13.0 - 17.0 g/dL 13.4  12.4  12.9   Hematocrit 39.0 - 52.0 % 40.2  37.9  38.9   Platelets 150 - 400 K/uL 212  250  189       Latest Ref Rng & Units 03/29/2022   12:37 AM 02/15/2022   10:37 AM 02/05/2022   10:17 PM  CMP  Glucose 70 - 99 mg/dL 104  82  125   BUN 8 - 23 mg/dL '23  17  20   '$ Creatinine 0.61 - 1.24 mg/dL 1.06  1.02  1.23   Sodium 135 - 145 mmol/L 140  139  140   Potassium 3.5 - 5.1 mmol/L 4.3  4.6  4.4   Chloride 98 - 111 mmol/L 107  106  108   CO2 22 - 32 mmol/L '25   27  21   '$ Calcium 8.9 - 10.3 mg/dL 9.2  9.2  9.3   Total Protein 6.5 - 8.1 g/dL 7.5     Total Bilirubin 0.3 - 1.2 mg/dL 0.6     Alkaline Phos 38 - 126 U/L 35     AST 15 - 41 U/L 21     ALT 0 - 44 U/L 15        Imaging studies:   CT Abdomen/Pelvis (03/29/2022) personally reviewed which shows changes consistent with known urinary diversion, there are changes in the sigmoid colon consistent with diverticulitis, no gross pneumoperitoneum nor abscess, and radiologist report reviewed below:  IMPRESSION: 1. Acute, perforated sigmoid diverticulitis. No evidence of obstruction, intra-abdominal abscess formation, or gross free intraperitoneal gas. 2. Surgical changes of cystectomy and ileal conduit formation with a right mid abdominal urostomy. Mild right and moderate left hydronephrosis with delayed left renal cortical enhancement related to changes of obstructive uropathy. Superimposed mild left periureteric inflammatory stranding and marked urothelial enhancement suggesting superimposed infection. Correlation with urinalysis and urine culture may be helpful. 3. Cholelithiasis. 4. Large hiatal hernia. 5. Scrotal wall thickening with punctate gas within the left hemiscrotum, likely postsurgical in nature given the history of recent hydrocelectomy. Small left varicocele noted. 6.  Aortic Atherosclerosis (ICD10-I70.0).    Assessment/Plan: (ICD-10's: K39.92) 76 y.o. male with what appears to be uncomplicated diverticulitis with possible microperforation without gross free air nor abscess   - Recommend continuation of NPO for today to allow for bowel rest. I think it is reasonable to allow sips with medications or ice chips for comfort  - No indication for emergent surgical intervention. He, and his wife, understand that should he clinically deteriorate in any fashion, or fail to improve, we may need to consider this.   - Appreciate urology input/recommendations  - Continue Abx (Cefepime,  Flagyl)  - Monitor abdominal examination; on-going bowel function - Pain control prn; antiemetics prn   - Monitor leukocytosis; mild  - Mobilization as tolerated  - Further management per primary service; we will follow   All of the above findings and recommendations were discussed with the patient and her family, and all of their questions were answered to their expressed satisfaction.  Thank you for the opportunity to participate in this patient's care.   -- Edison Simon, PA-C [facility] 03/29/2022, 8:03 AM M-F: 7am - 4pm

## 2022-03-29 NOTE — Progress Notes (Signed)
CODE SEPSIS - PHARMACY COMMUNICATION  **Broad Spectrum Antibiotics should be administered within 1 hour of Sepsis diagnosis**  Time Code Sepsis Called/Page Received: 1607  Antibiotics Ordered: Cefepime & Vancomycin  Time of 1st antibiotic administration: 0345  Renda Rolls, PharmD, MBA 03/29/2022 4:05 AM

## 2022-03-29 NOTE — ED Notes (Signed)
Reported pt shaking, flushed face and oral temp of 100.4 to Dr Leonides Schanz... orders received. Pt transported to [place] via stretcher.

## 2022-03-29 NOTE — Assessment & Plan Note (Addendum)
CT showing perforated sigmoid diverticulitis without obstruction, intra-abdominal abscess formation or gross free intraperitoneal gas Abdominal exam is nonacute but surgical consult requested Continue cefepime and will add Flagyl Surgery consulted Keep n.p.o. Pain management and supportive care

## 2022-03-29 NOTE — Progress Notes (Signed)
  Progress Note   Patient: Donald Barry [id number]:[id number] DOB: November 18, 1945 DOA: 03/29/2022     0 DOS: the patient was seen and examined on 03/29/2022   Brief hospital course: Donald Barry is a 76 y.o. 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  Patient had temperature 101.9, pulse rate 102, leukocytosis 11800.   CT scan abdomen/pelvis showed acute sigmoid diverticulitis with evidence of perforation.  Postsurgical changes with ED at that conduit and a cystectomy.  Left hydronephrosis with a mild periureteric inflammation stranding suggest possible pyelonephritis. Patient has been evaluated by urology, no intervention needed.  Patient also has been reevaluated by general surgery, recommend antibiotics and clinical follow-up.  Patient will be placed on n.p.o. in case needed surgery. Antibiotics was started on cefepime and Flagyl.  Assessment and Plan: * Sepsis ([place]) Perforation of sigmoid colon due to diverticulitis Sepsis criteria includes fever, tachycardia, leukocytosis and source of infection CT showing perforated sigmoid diverticulitis without obstruction, intra-abdominal abscess formation or gross free intraperitoneal gas. Discussed with general surgery, will continue antibiotics. NPO in case patient needs surgery. Continue iv antibiotics with cefepime and flagyl. Continue IVF and symptomatic treatment.   Acute pyelonephritis S/p hydrocelectomy 03/15/2022 Bilateral hydronephrosis History of cystectomy with ileal conduit and urostomy 12/2020 secondary to end-stage interstitial cystitis Urine culture sent out, but limited value due to ileoconduit. Blood culture pending.  Continue cefepime.  Paroxysmal atrial  fibrillation ([facility]) Chronic anticoagulation Continue metoprolol  Hold anticoagulation.  OSA on CPAP Continue CPAP       Subjective:  Patient is doing better today, still has significant abdominal pain require pain medicine.  No nausea vomiting.  No additional fever or chills.  Physical Exam: Vitals:   03/29/22 0500 03/29/22 0600 03/29/22 0619 03/29/22 0759  BP: 111/78 103/73 128/78 103/73  Pulse: 74 72 74 76  Resp:  '18 18 16  '$ Temp:  98.7 F (37.1 C) 98.3 F (36.8 C) 98.8 F (37.1 C)  TempSrc:  Oral    SpO2: 98% 99% 100% 96%  Weight:   87.4 kg   Height:       General exam: Appears calm and comfortable  Respiratory system: Clear to auscultation. Respiratory effort normal. Cardiovascular system: S1 & S2 heard, RRR. No JVD, murmurs, rubs, gallops or clicks. No pedal edema. Gastrointestinal system: Abdomen is nondistended, soft and LLQ tender, no rebound tenderness. No organomegaly or masses felt. Normal bowel sounds heard. Central nervous system: Alert and oriented. No focal neurological deficits. Extremities: Symmetric 5 x 5 power. Skin: No rashes, lesions or ulcers Psychiatry: Judgement and insight appear normal. Mood & affect appropriate. ' Data Reviewed:  Reviewed CT scan results, all lab results.  Family Communication: Wife updated at the bedside.  Disposition: Status is: Inpatient Remains inpatient appropriate because: Severity of disease, IV treatment.  May need inpatient procedure.  Planned Discharge Destination: Home with Home Health    Time spent: 50 minutes  Author: Sharen Hones, MD 03/29/2022 12:06 PM  For on call review [email or web address].

## 2022-03-30 DIAGNOSIS — A419 Sepsis, unspecified organism: Secondary | ICD-10-CM

## 2022-03-30 DIAGNOSIS — N390 Urinary tract infection, site not specified: Secondary | ICD-10-CM

## 2022-03-30 DIAGNOSIS — K5720 Diverticulitis of large intestine with perforation and abscess without bleeding: Secondary | ICD-10-CM | POA: Diagnosis not present

## 2022-03-30 DIAGNOSIS — N10 Acute tubulo-interstitial nephritis: Secondary | ICD-10-CM | POA: Diagnosis not present

## 2022-03-30 LAB — BASIC METABOLIC PANEL
Anion gap: 5 (ref 5–15)
BUN: 16 mg/dL (ref 8–23)
CO2: 24 mmol/L (ref 22–32)
Calcium: 8.4 mg/dL — ABNORMAL LOW (ref 8.9–10.3)
Chloride: 113 mmol/L — ABNORMAL HIGH (ref 98–111)
Creatinine, Ser: 0.81 mg/dL (ref 0.61–1.24)
GFR, Estimated: >60 mL/min (ref >60)
Glucose, Bld: 78 mg/dL (ref 70–99)
Potassium: 3.6 mmol/L (ref 3.5–5.1)
Sodium: 142 mmol/L (ref 135–145)

## 2022-03-30 LAB — CBC
HCT: 33.3 % — ABNORMAL LOW (ref 39.0–52.0)
Hemoglobin: 11.1 g/dL — ABNORMAL LOW (ref 13.0–17.0)
MCH: 29.8 pg (ref 26.0–34.0)
MCHC: 33.3 g/dL (ref 30.0–36.0)
MCV: 89.3 fL (ref 80.0–100.0)
Platelets: 151 10*3/uL (ref 150–400)
RBC: 3.73 MIL/uL — ABNORMAL LOW (ref 4.22–5.81)
RDW: 14.7 % (ref 11.5–15.5)
WBC: 7.6 10*3/uL (ref 4.0–10.5)
nRBC: 0 % (ref 0.0–0.2)

## 2022-03-30 LAB — PROTIME-INR
INR: 1.3 — ABNORMAL HIGH (ref 0.8–1.2)
Prothrombin Time: 16.2 seconds — ABNORMAL HIGH (ref 11.4–15.2)

## 2022-03-30 LAB — MAGNESIUM: Magnesium: 1.6 mg/dL — ABNORMAL LOW (ref 1.7–2.4)

## 2022-03-30 LAB — CORTISOL-AM, BLOOD: Cortisol - AM: 2.8 ug/dL — ABNORMAL LOW (ref 6.7–22.6)

## 2022-03-30 LAB — PROCALCITONIN: Procalcitonin: <0.1 ng/mL

## 2022-03-30 MED ORDER — MAGNESIUM SULFATE 2 GM/50ML IV SOLN
2.0000 g | Freq: Once | INTRAVENOUS | Status: AC
  Administered 2022-03-30: 2 g via INTRAVENOUS
  Filled 2022-03-30: qty 50

## 2022-03-30 NOTE — Progress Notes (Signed)
  Progress Note   Patient: Donald Barry ZOX:[id number] DOB: [birth year] DOA: 03/29/2022     1 DOS: the patient was seen and examined on 03/30/2022   Brief hospital course: Donald Barry is a 76 y.o. 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  Patient had temperature 101.9, pulse rate 102, leukocytosis 11800.   CT scan abdomen/pelvis showed acute sigmoid diverticulitis with evidence of perforation.  Postsurgical changes with ilieoconduit and a cystectomy.  Left hydronephrosis with a mild periureteric inflammation stranding suggest possible pyelonephritis. Patient has been evaluated by urology, no intervention needed.  Patient also has been reevaluated by general surgery, recommend antibiotics and clinical follow-up.  Patient will be placed on n.p.o. in case needed surgery. Antibiotics was started on cefepime and Flagyl.  Assessment and Plan: * Sepsis (Donald Barry) Perforation of sigmoid colon due to diverticulitis Sepsis criteria includes fever, tachycardia, leukocytosis and source of infection CT showing perforated sigmoid diverticulitis without obstruction, intra-abdominal abscess formation or gross free intraperitoneal gas. Patient condition is more stable today, has not required any pain medicine since midnight. Antibiotics continued. Patient is followed by general surgery, diet per surgery, continue IV fluids until patient can tolerate diet.   Acute pyelonephritis S/p hydrocelectomy 03/15/2022 Bilateral hydronephrosis History of cystectomy with ileal conduit and urostomy 12/2020 secondary to end-stage interstitial cystitis Urine culture sent out, but limited value due to ileoconduit.  Blood cultures so far no growth,  urine culture showed gram-negative rods.  Paroxysmal atrial fibrillation (HCC) Chronic anticoagulation Continue metoprolol  Hold anticoagulation.   OSA on CPAP Continue CPAP      Subjective:  Patient doing much better today, not requiring any pain medicine since midnight.  No nausea vomiting.  No additional fever or chills.  Physical Exam: Vitals:   03/29/22 0759 03/29/22 1537 03/29/22 2356 03/30/22 0752  BP: 103/73 117/68 106/66 105/70  Pulse: 76 74 66 75  Resp: '16 16 16 16  '$ Temp: 98.8 F (37.1 C) 97.9 F (36.6 C) 98 F (36.7 C) 98.1 F (36.7 C)  TempSrc:      SpO2: 96% 98% 96% 95%  Weight:      Height:       General exam: Appears calm and comfortable  Respiratory system: Clear to auscultation. Respiratory effort normal. Cardiovascular system: S1 & S2 heard, RRR. No JVD, murmurs, rubs, gallops or clicks. No pedal edema. Gastrointestinal system: Abdomen is nondistended, soft and nontender, no rebound. No organomegaly or masses felt. Normal bowel sounds heard. Central nervous system: Alert and oriented. No focal neurological deficits. Extremities: Symmetric 5 x 5 power. Skin: No rashes, lesions or ulcers Psychiatry: Judgement and insight appear normal. Mood & affect appropriate.   Data Reviewed:  Lab results reviewed.  Family Communication:   Disposition: Status is: Inpatient Remains inpatient appropriate because: Severity of disease, IV treatment.  Planned Discharge Destination: Home with Home Health    Time spent: 35 minutes  Author: Sharen Hones, MD 03/30/2022 10:44 AM  For on call review [email or web address].

## 2022-03-30 NOTE — Progress Notes (Signed)
Subjective: Donald Barry is doing well this morning without pain or fever.  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  His UCx has 20K GNR but it is impossible to get a clean catch from the conduit.  His Cr has declined and he has good UOP.  ROS:  Review of Systems  Constitutional:  Negative for chills and fever.  Gastrointestinal:  Negative for abdominal pain and nausea.  Genitourinary:  Negative for flank pain.    Anti-infectives: Anti-infectives (From admission, onward)    Start     Dose/Rate Route Frequency Ordered Stop   03/29/22 1300  ceFEPIme (MAXIPIME) 2 g in sodium chloride 0.9 % 100 mL IVPB        2 g 200 mL/hr over 30 Minutes Intravenous Every 8 hours 03/29/22 0546 04/05/22 0559   03/29/22 0545  metroNIDAZOLE (FLAGYL) IVPB 500 mg        500 mg 100 mL/hr over 60 Minutes Intravenous Every 12 hours 03/29/22 0533 04/05/22 0544   03/29/22 0245  ceFEPIme (MAXIPIME) 2 g in sodium chloride 0.9 % 100 mL IVPB        2 g 200 mL/hr over 30 Minutes Intravenous  Once 03/29/22 0242 03/29/22 0415   03/29/22 0245  vancomycin (VANCOREADY) IVPB 2000 mg/400 mL        2,000 mg 200 mL/hr over 120 Minutes Intravenous  Once 03/29/22 0243 03/29/22 0641       Current Facility-Administered Medications  Medication Dose Route Frequency Provider Last Rate Last Admin   acetaminophen (TYLENOL) tablet 650 mg  650 mg Oral Q6H PRN Athena Masse, MD   650 mg at 03/30/22 0501   Or   acetaminophen (TYLENOL) suppository 650 mg  650 mg Rectal Q6H PRN Athena Masse, MD       atorvastatin (LIPITOR) tablet 10 mg  10 mg Oral Daily Judd Gaudier V, MD   10 mg at 03/30/22 0840   ceFEPIme (MAXIPIME) 2 g in sodium chloride 0.9 % 100 mL IVPB  2 g Intravenous Q8H Renda Rolls, RPH   Stopped at 03/30/22 0640   HYDROmorphone (DILAUDID) injection 0.5  mg  0.5 mg Intravenous Q4H PRN Sharen Hones, MD   0.5 mg at 03/29/22 0930   lactated ringers infusion   Intravenous Continuous Sharen Hones, MD 75 mL/hr at 03/30/22 0658 Infusion Verify at 03/30/22 0658   metoprolol tartrate (LOPRESSOR) tablet 12.5 mg  12.5 mg Oral BID Judd Gaudier V, MD   12.5 mg at 03/30/22 0839   metroNIDAZOLE (FLAGYL) IVPB 500 mg  500 mg Intravenous Q12H Athena Masse, MD   Stopped at 03/30/22 0602   oxyCODONE-acetaminophen (PERCOCET/ROXICET) 5-325 MG per tablet 1 tablet  1 tablet Oral Q4H PRN Sharen Hones, MD   1 tablet at 03/30/22 0839   pantoprazole (PROTONIX) EC tablet 40 mg  40 mg Oral BID Sharen Hones, MD   40 mg at 03/30/22 0839   traZODone (DESYREL) tablet 50 mg  50 mg Oral QHS PRN Athena Masse, MD         Objective: Vital signs in last 24 hours: Temp:  [97.9 F (36.6 C)-98.1 F (36.7 C)] 98.1 F (36.7 C) (11/18 0752) Pulse Rate:  [66-75] 75 (11/18 0752) Resp:  [16] 16 (11/18 0752) BP: (105-117)/(66-70) 105/70 (11/18 0752) SpO2:  [95 %-98 %]  95 % (11/18 0752)  Intake/Output from previous day: 11/17 0701 - 11/18 0700 In: 1593.3 [I.V.:993.3; IV Piggyback:600] Out: 3100 [Urine:3100] Intake/Output this shift: Total I/O In: -  Out: 875 [Urine:875]   Physical Exam Vitals reviewed.  Constitutional:      Appearance: He is well-developed.  Abdominal:     General: Abdomen is flat.     Palpations: Abdomen is soft.     Tenderness: There is no abdominal tenderness.     Comments: RLQ stoma is pink and productive.   Genitourinary:    Comments: Left scrotal incision is intact with expected induration and enlargement of the left testicle following a hydrocelectomy.   Neurological:     Mental Status: He is alert.     Lab Results:  Recent Labs    03/29/22 0037 03/30/22 0403  WBC 11.8* 7.6  HGB 13.4 11.1*  HCT 40.2 33.3*  PLT 212 151   BMET Recent Labs    03/29/22 0037 03/30/22 0403  NA 140 142  K 4.3 3.6  CL 107 113*  CO2 25 24   GLUCOSE 104* 78  BUN 23 16  CREATININE 1.06 0.81  CALCIUM 9.2 8.4*   PT/INR Recent Labs    03/30/22 0403  LABPROT 16.2*  INR 1.3*   ABG No results for input(s): "PHART", "HCO3" in the last 72 hours.  Invalid input(s): "PCO2", "PO2"  Studies/Results: CT ABDOMEN PELVIS W CONTRAST  Result Date: 03/29/2022 CLINICAL DATA:  Left lower quadrant abdominal pain, chills EXAM: CT ABDOMEN AND PELVIS WITH CONTRAST TECHNIQUE: Multidetector CT imaging of the abdomen and pelvis was performed using the standard protocol following bolus administration of intravenous contrast. RADIATION DOSE REDUCTION: This exam was performed according to the departmental dose-optimization program which includes automated exposure control, adjustment of the mA and/or kV according to patient size and/or use of iterative reconstruction technique. CONTRAST:  163m OMNIPAQUE IOHEXOL 300 MG/ML  SOLN COMPARISON:  08/10/2021 FINDINGS: Lower chest: Bibasilar atelectasis. Cardiac size within normal limits. Large hiatal hernia. Hepatobiliary: Cholelithiasis without pericholecystic inflammatory change. Tiny probable cyst within the right hepatic dome. Liver otherwise unremarkable. No intra or extrahepatic biliary ductal dilation. Pancreas: Unremarkable Spleen: Unremarkable Adrenals/Urinary Tract: The adrenal glands are unremarkable. Surgical changes of cystectomy and ileal conduit formation are identified with a right mid abdominal urostomy identified. Small fat containing parastomal hernia. The kidneys are normal in size and position. There is mild right and moderate left hydronephrosis with delayed left renal cortical enhancement noted related to changes of obstructive uropathy. There is, additionally, superimposed mild left periureteric inflammatory stranding and marked urothelial enhancement suggesting superimposed infection. Ureteral dilation bilaterally extends to the level of the ureteral anastomoses. Immediately adjacent to the  distal left ureter are extensive inflammatory changes and punctate foci of extraluminal gas in keeping with changes of acute diverticulitis described below. No perinephric fluid collections are identified. No intrarenal or ureteral calculi are seen. The conduit itself is decompressed. Stomach/Bowel: Moderate sigmoid diverticulosis. There is focal circumferential colonic wall thickening and extensive pericolonic inflammatory stranding with punctate foci of extraluminal gas adjacent to the mid sigmoid colon in keeping with changes of acute, perforated sigmoid diverticulitis. No evidence of obstruction. No gross free intraperitoneal gas. No free intraperitoneal fluid or loculated intra-abdominal fluid collections. The stomach, small bowel, and large bowel are otherwise unremarkable. Appendix normal. Vascular/Lymphatic: Mild aortoiliac atherosclerotic calcification. No aortic aneurysm. No pathologic adenopathy within the abdomen and pelvis. Reproductive: The prostate gland and seminal vesicles are unremarkable. There is scrotal wall thickening noted.  There is punctate gas within the left hemiscrotum, likely postsurgical in nature given the history of recent hydrocelectomy. Small left varicocele noted. Other: None Musculoskeletal: Osseous structures are age-appropriate. No acute bone abnormality. IMPRESSION: 1. Acute, perforated sigmoid diverticulitis. No evidence of obstruction, intra-abdominal abscess formation, or gross free intraperitoneal gas. 2. Surgical changes of cystectomy and ileal conduit formation with a right mid abdominal urostomy. Mild right and moderate left hydronephrosis with delayed left renal cortical enhancement related to changes of obstructive uropathy. Superimposed mild left periureteric inflammatory stranding and marked urothelial enhancement suggesting superimposed infection. Correlation with urinalysis and urine culture may be helpful. 3. Cholelithiasis. 4. Large hiatal hernia. 5. Scrotal wall  thickening with punctate gas within the left hemiscrotum, likely postsurgical in nature given the history of recent hydrocelectomy. Small left varicocele noted. 6.  Aortic Atherosclerosis (ICD10-I70.0). Electronically Signed   By: Fidela Salisbury M.D.   On: 03/29/2022 04:22   US SCROTUM W/DOPPLER  Result Date: 03/29/2022 CLINICAL DATA:  Left testicular pain, status post left hydrocelectomy EXAM: SCROTAL ULTRASOUND DOPPLER ULTRASOUND OF THE TESTICLES TECHNIQUE: Complete ultrasound examination of the testicles, epididymis, and other scrotal structures was performed. Color and spectral Doppler ultrasound were also utilized to evaluate blood flow to the testicles. COMPARISON:  None Available. FINDINGS: Right testicle Measurements: 3.6 x 2.4 x 2.4 cm. Normal echogenicity and echotexture. Normal color flow vascularity. No mass or microlithiasis visualized. Left testicle Measurements: 4.4 x 2.5 x 2.7 cm. There is diffuse mild parenchymal edema of the left testis. Color flow vascularity is normal. No mass or microlithiasis visualized. Right epididymis:  Normal in size and appearance. Left epididymis: The left epididymis is markedly thickened and hypervascular, best appreciated on image # 50 and cine sequence # 2 suggesting changes of acute epididymitis. There is and echogenic linear shadowing structure within the left hemiscrotum adjacent to the epididymis demonstrating "dirty shadowing" which may represent a small amount of gas within the left hemiscrotum, less likely a postsurgical implant. Hydrocele:  Trace right hydrocele noted Varicocele:  None visualized. Pulsed Doppler interrogation of both testes demonstrates normal low resistance arterial and venous waveforms bilaterally. Other: There is marked scrotal wall thickening and hypervascularity noted, asymmetrically more severe involving the left hemiscrotum, possibly postsurgical in nature though a superimposed acute inflammatory process such as cellulitis could  appear similarly. IMPRESSION: 1. Findings most consistent with acute left epididymo-orchitis. 2. Marked left scrotal wall thickening and hypervascularity, asymmetrically more severe involving the left hemiscrotum, possibly postsurgical in nature though a superimposed acute inflammatory process such as cellulitis could appear similarly. 3. Linear echogenic shadowing structure within the left hemiscrotum adjacent to the epididymis demonstrating ultrasonographic shadowing which may represent a small amount of gas within the left hemiscrotum, less likely a postsurgical implant. 4. No evidence of testicular infarction Electronically Signed   By: Fidela Salisbury M.D.   On: 03/29/2022 03:59    Images reviewed.   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 Amalia Hailey, his primary urologist after discharge.  I have notified him via the Outside Message function in Epic but he does have follow up scheduled with Dr. Amalia Hailey in December.   Left hydrocele s/p repair.  Wound is  intact without evidence of infection.  Swelling and induration are expected.  Sepsis with diverticulitis and microperforation.  He is afebrile without abdominal pain.  Surgery managing this.      LOS: 1 day    Irine Seal 11/18/2023Patient ID: Donald Barry, male   DOB: October 27, 1945, 76 y.o.   MRN: [id number]

## 2022-03-30 NOTE — Plan of Care (Signed)
  Problem: Clinical Measurements: Goal: Diagnostic test results will improve Outcome: Progressing   Problem: Nutrition: Goal: Adequate nutrition will be maintained Outcome: Progressing   Problem: Coping: Goal: Level of anxiety will decrease Outcome: Progressing

## 2022-03-30 NOTE — Progress Notes (Signed)
Subjective:  CC: Donald Barry is a 76 y.o. male  Hospital stay day 1,   diverticulitis  HPI: No acute issues overnight, diverticulitis standpoint.  Pain improved.  ROS:  General: Denies weight loss, weight gain, fatigue, fevers, chills, and night sweats. Heart: Denies chest pain, palpitations, racing heart, irregular heartbeat, leg pain or swelling, and decreased activity tolerance. Respiratory: Denies breathing difficulty, shortness of breath, wheezing, cough, and sputum. GI: Denies change in appetite, heartburn, nausea, vomiting, constipation, diarrhea, and blood in stool. GU: Denies difficulty urinating, pain with urinating, urgency, frequency, blood in urine.   Objective:   Temp:  [97.9 F (36.6 C)-98.1 F (36.7 C)] 98.1 F (36.7 C) (11/18 0752) Pulse Rate:  [66-75] 75 (11/18 0752) Resp:  [16] 16 (11/18 0752) BP: (105-117)/(66-70) 105/70 (11/18 0752) SpO2:  [95 %-98 %] 95 % (11/18 0752)     Height: 6' (182.9 cm) Weight: 87.4 kg BMI (Calculated): 26.13   Intake/Output this shift:   Intake/Output Summary (Last 24 hours) at 03/30/2022 1148 Last data filed at 03/30/2022 0749 Gross per 24 hour  Intake 1593.31 ml  Output 3975 ml  Net -2381.69 ml    Constitutional :  alert, cooperative, appears stated age, and no distress  Respiratory:  clear to auscultation bilaterally  Cardiovascular:  regular rate and rhythm  Gastrointestinal: Soft, no guarding, minimal tenderness to palpation left lower quadrant .  Ileal conduit in place  Skin: Cool and moist.   Psychiatric: Normal affect, non-agitated, not confused       LABS:     Latest Ref Rng & Units 03/30/2022    4:03 AM 03/29/2022   12:37 AM 02/15/2022   10:37 AM  CMP  Glucose 70 - 99 mg/dL 78  104  82   BUN 8 - 23 mg/dL '16  23  17   '$ Creatinine 0.61 - 1.24 mg/dL 0.81  1.06  1.02   Sodium 135 - 145 mmol/L 142  140  139   Potassium 3.5 - 5.1 mmol/L 3.6  4.3  4.6   Chloride 98 - 111 mmol/L 113  107  106   CO2 22 - 32  mmol/L '24  25  27   '$ Calcium 8.9 - 10.3 mg/dL 8.4  9.2  9.2   Total Protein 6.5 - 8.1 g/dL  7.5    Total Bilirubin 0.3 - 1.2 mg/dL  0.6    Alkaline Phos 38 - 126 U/L  35    AST 15 - 41 U/L  21    ALT 0 - 44 U/L  15        Latest Ref Rng & Units 03/30/2022    4:03 AM 03/29/2022   12:37 AM 03/01/2022   11:37 AM  CBC  WBC 4.0 - 10.5 K/uL 7.6  11.8  9.2   Hemoglobin 13.0 - 17.0 g/dL 11.1  13.4  12.4   Hematocrit 39.0 - 52.0 % 33.3  40.2  37.9   Platelets 150 - 400 K/uL 151  212  250     RADS: N/a Assessment:   76 y.o. male with what appears to be uncomplicated diverticulitis with possible microperforation without gross free air nor abscess.  Clinically doing well.  We will advance to clear liquid diet and continue to monitor.  Okay to stop IV fluids.  Left pyelonephritis read on the CT scan likely from irritation due to adjacent diverticulitis.  Continue IV antibiotics for the diverticulitis.   labs/images/medications/previous chart entries reviewed personally and relevant changes/updates noted above.

## 2022-03-31 DIAGNOSIS — N10 Acute tubulo-interstitial nephritis: Secondary | ICD-10-CM | POA: Diagnosis not present

## 2022-03-31 DIAGNOSIS — N1330 Unspecified hydronephrosis: Secondary | ICD-10-CM

## 2022-03-31 DIAGNOSIS — A419 Sepsis, unspecified organism: Secondary | ICD-10-CM | POA: Diagnosis not present

## 2022-03-31 DIAGNOSIS — K5720 Diverticulitis of large intestine with perforation and abscess without bleeding: Secondary | ICD-10-CM | POA: Diagnosis not present

## 2022-03-31 DIAGNOSIS — N390 Urinary tract infection, site not specified: Secondary | ICD-10-CM | POA: Diagnosis not present

## 2022-03-31 LAB — BASIC METABOLIC PANEL
Anion gap: 6 (ref 5–15)
BUN: 13 mg/dL (ref 8–23)
CO2: 24 mmol/L (ref 22–32)
Calcium: 8.3 mg/dL — ABNORMAL LOW (ref 8.9–10.3)
Chloride: 112 mmol/L — ABNORMAL HIGH (ref 98–111)
Creatinine, Ser: 0.72 mg/dL (ref 0.61–1.24)
GFR, Estimated: >60 mL/min (ref >60)
Glucose, Bld: 90 mg/dL (ref 70–99)
Potassium: 3.6 mmol/L (ref 3.5–5.1)
Sodium: 142 mmol/L (ref 135–145)

## 2022-03-31 LAB — MAGNESIUM: Magnesium: 2 mg/dL (ref 1.7–2.4)

## 2022-03-31 LAB — CBC
HCT: 34.4 % — ABNORMAL LOW (ref 39.0–52.0)
Hemoglobin: 11.7 g/dL — ABNORMAL LOW (ref 13.0–17.0)
MCH: 30 pg (ref 26.0–34.0)
MCHC: 34 g/dL (ref 30.0–36.0)
MCV: 88.2 fL (ref 80.0–100.0)
Platelets: 166 10*3/uL (ref 150–400)
RBC: 3.9 MIL/uL — ABNORMAL LOW (ref 4.22–5.81)
RDW: 14.6 % (ref 11.5–15.5)
WBC: 7.2 10*3/uL (ref 4.0–10.5)
nRBC: 0 % (ref 0.0–0.2)

## 2022-03-31 LAB — URINE CULTURE: Culture: 20000 — AB

## 2022-03-31 NOTE — Progress Notes (Signed)
  Progress Note   Patient: Donald Barry RSW:[id number] DOB: 04-07-1946 DOA: 03/29/2022     2 DOS: the patient was seen and examined on 03/31/2022   Brief hospital course: Donald Barry is a 76 y.o. 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  Patient had temperature 101.9, pulse rate 102, leukocytosis 11800.   CT scan abdomen/pelvis showed acute sigmoid diverticulitis with evidence of perforation.  Postsurgical changes with ilieoconduit and a cystectomy.  Left hydronephrosis with a mild periureteric inflammation stranding suggest possible pyelonephritis. Patient has been evaluated by urology, no intervention needed.  Patient also has been reevaluated by general surgery, recommend antibiotics and clinical follow-up.  Patient will be placed on n.p.o. in case needed surgery. Antibiotics was started on cefepime and Flagyl.  Assessment and Plan:  Sepsis ([place]) Perforation of sigmoid colon due to diverticulitis Sepsis criteria includes fever, tachycardia, leukocytosis and source of infection CT showing perforated sigmoid diverticulitis without obstruction, intra-abdominal abscess formation or gross free intraperitoneal gas. Condition continued to improve, no tenderness in the left lower quadrant.  Discussed with surgery, diet advanced to full liquid.  Continue antibiotics with cefepime and Flagyl.   Acute pyelonephritis S/p hydrocelectomy 03/15/2022 Bilateral hydronephrosis History of cystectomy with ileal conduit and urostomy 12/2020 secondary to end-stage interstitial cystitis Urine culture sent out, but limited value due to ileoconduit.  Blood cultures so far no growth, urine culture growing Klebsiella pneumoniae.  No  change antibiotics.   Paroxysmal atrial fibrillation (HCC) Chronic anticoagulation Continue metoprolol  Hold anticoagulation.   OSA on CPAP Continue CPAP      Subjective:  Condition much improved, abdominal pain essentially resolved.  Patient was able to walk in the hallway.  Physical Exam: Vitals:   03/30/22 1700 03/30/22 1950 03/31/22 0508 03/31/22 0831  BP: 120/74 131/75 117/80 115/81  Pulse: 75 70 70 72  Resp: '18 18 18   '$ Temp: 97.6 F (36.4 C) 99.5 F (37.5 C) 98.5 F (36.9 C) 98 F (36.7 C)  TempSrc:      SpO2: 100% 98% 98% 98%  Weight:      Height:       General exam: Appears calm and comfortable  Respiratory system: Clear to auscultation. Respiratory effort normal. Cardiovascular system: S1 & S2 heard, RRR. No JVD, murmurs, rubs, gallops or clicks. No pedal edema. Gastrointestinal system: Abdomen is nondistended, soft and nontender. No organomegaly or masses felt. Normal bowel sounds heard. Central nervous system: Alert and oriented. No focal neurological deficits. Extremities: Symmetric 5 x 5 power. Skin: No rashes, lesions or ulcers Psychiatry: Judgement and insight appear normal. Mood & affect appropriate.   Data Reviewed:  Lab results reviewed.  Family Communication: Son updated at bedside.  Disposition: Status is: Inpatient Remains inpatient appropriate because: Severity of disease, IV treatment.  Planned Discharge Destination: Home    Time spent: 35 minutes  Author: Sharen Hones, MD 03/31/2022 9:52 AM  For on call review [email or web address].

## 2022-03-31 NOTE — Plan of Care (Signed)
  Problem: Clinical Measurements: Goal: Will remain free from infection Outcome: Progressing   Problem: Clinical Measurements: Goal: Diagnostic test results will improve Outcome: Progressing   Problem: Coping: Goal: Level of anxiety will decrease Outcome: Progressing   Problem: Elimination: Goal: Will not experience complications related to bowel motility Outcome: Progressing   Problem: Pain Managment: Goal: General experience of comfort will improve Outcome: Progressing

## 2022-03-31 NOTE — Progress Notes (Signed)
Subjective:  CC: Donald Barry is a 76 y.o. male  Hospital stay day 2,   diverticulitis  HPI: No acute issues overnight, diverticulitis standpoint.  Pain resolved.  Tolerating clear liquid diet.  Bowel movements normal this AM.  ROS:  General: Denies weight loss, weight gain, fatigue, fevers, chills, and night sweats. Heart: Denies chest pain, palpitations, racing heart, irregular heartbeat, leg pain or swelling, and decreased activity tolerance. Respiratory: Denies breathing difficulty, shortness of breath, wheezing, cough, and sputum. GI: Denies change in appetite, heartburn, nausea, vomiting, constipation, diarrhea, and blood in stool. GU: Denies difficulty urinating, pain with urinating, urgency, frequency, blood in urine.   Objective:   Temp:  [97.6 F (36.4 C)-99.5 F (37.5 C)] 98 F (36.7 C) (11/19 0831) Pulse Rate:  [70-75] 72 (11/19 0831) Resp:  [18] 18 (11/19 0508) BP: (115-131)/(74-81) 115/81 (11/19 0831) SpO2:  [98 %-100 %] 98 % (11/19 0831)     Height: 6' (182.9 cm) Weight: 87.4 kg BMI (Calculated): 26.13   Intake/Output this shift:   Intake/Output Summary (Last 24 hours) at 03/31/2022 1059 Last data filed at 03/31/2022 0700 Gross per 24 hour  Intake 1107.91 ml  Output 3225 ml  Net -2117.09 ml    Constitutional :  alert, cooperative, appears stated age, and no distress  Respiratory:  clear to auscultation bilaterally  Cardiovascular:  regular rate and rhythm  Gastrointestinal: Soft, no guarding, no tenderness to palpation left lower quadrant .  Ileal conduit in place  Skin: Cool and moist.   Psychiatric: Normal affect, non-agitated, not confused       LABS:     Latest Ref Rng & Units 03/31/2022    5:29 AM 03/30/2022    4:03 AM 03/29/2022   12:37 AM  CMP  Glucose 70 - 99 mg/dL 90  78  104   BUN 8 - 23 mg/dL '13  16  23   '$ Creatinine 0.61 - 1.24 mg/dL 0.72  0.81  1.06   Sodium 135 - 145 mmol/L 142  142  140   Potassium 3.5 - 5.1 mmol/L 3.6  3.6  4.3    Chloride 98 - 111 mmol/L 112  113  107   CO2 22 - 32 mmol/L '24  24  25   '$ Calcium 8.9 - 10.3 mg/dL 8.3  8.4  9.2   Total Protein 6.5 - 8.1 g/dL   7.5   Total Bilirubin 0.3 - 1.2 mg/dL   0.6   Alkaline Phos 38 - 126 U/L   35   AST 15 - 41 U/L   21   ALT 0 - 44 U/L   15       Latest Ref Rng & Units 03/31/2022    5:29 AM 03/30/2022    4:03 AM 03/29/2022   12:37 AM  CBC  WBC 4.0 - 10.5 K/uL 7.2  7.6  11.8   Hemoglobin 13.0 - 17.0 g/dL 11.7  11.1  13.4   Hematocrit 39.0 - 52.0 % 34.4  33.3  40.2   Platelets 150 - 400 K/uL 166  151  212     RADS: N/a Assessment:   76 y.o. male with what appears to be uncomplicated diverticulitis with possible microperforation without gross free air nor abscess.  Clinically continues to do well.  Advance to soft diet at this point, monitor for another 24 hours to make sure he tolerates.  Home hopefully in the next day or 2 from diverticulitis standpoint  Left pyelonephritis read on the CT scan  likely from irritation due to adjacent diverticulitis.  Continue IV antibiotics for the diverticulitis.   labs/images/medications/previous chart entries reviewed personally and relevant changes/updates noted above.

## 2022-03-31 NOTE — Progress Notes (Signed)
Subjective: Donald Barry is doing well this morning without significant pain or fever.   He is a little sore in the LLQ when he has been standing for a period of time. His Cr has declined and he has good UOP.  ROS:  Review of Systems  Constitutional:  Negative for chills and fever.  Gastrointestinal:  Negative for abdominal pain and nausea.  Genitourinary:  Negative for flank pain.    Anti-infectives: Anti-infectives (From admission, onward)    Start     Dose/Rate Route Frequency Ordered Stop   03/29/22 1300  ceFEPIme (MAXIPIME) 2 g in sodium chloride 0.9 % 100 mL IVPB        2 g 200 mL/hr over 30 Minutes Intravenous Every 8 hours 03/29/22 0546 04/05/22 0559   03/29/22 0545  metroNIDAZOLE (FLAGYL) IVPB 500 mg        500 mg 100 mL/hr over 60 Minutes Intravenous Every 12 hours 03/29/22 0533 04/05/22 0544   03/29/22 0245  ceFEPIme (MAXIPIME) 2 g in sodium chloride 0.9 % 100 mL IVPB        2 g 200 mL/hr over 30 Minutes Intravenous  Once 03/29/22 0242 03/29/22 0415   03/29/22 0245  vancomycin (VANCOREADY) IVPB 2000 mg/400 mL        2,000 mg 200 mL/hr over 120 Minutes Intravenous  Once 03/29/22 0243 03/29/22 0641       Current Facility-Administered Medications  Medication Dose Route Frequency Provider Last Rate Last Admin   acetaminophen (TYLENOL) tablet 650 mg  650 mg Oral Q6H PRN Athena Masse, MD   650 mg at 03/31/22 1021   Or   acetaminophen (TYLENOL) suppository 650 mg  650 mg Rectal Q6H PRN Athena Masse, MD       atorvastatin (LIPITOR) tablet 10 mg  10 mg Oral Daily Judd Gaudier V, MD   10 mg at 03/31/22 1021   ceFEPIme (MAXIPIME) 2 g in sodium chloride 0.9 % 100 mL IVPB  2 g Intravenous Q8H Belue, Alver Sorrow, RPH 200 mL/hr at 03/31/22 1419 2 g at 03/31/22 1419   HYDROmorphone (DILAUDID) injection 0.5 mg  0.5 mg Intravenous Q4H PRN Sharen Hones, MD   0.5 mg at 03/29/22 0930   metoprolol tartrate (LOPRESSOR) tablet 12.5 mg  12.5 mg Oral BID Judd Gaudier V, MD   12.5 mg at  03/31/22 1021   metroNIDAZOLE (FLAGYL) IVPB 500 mg  500 mg Intravenous Q12H Athena Masse, MD   Stopped at 03/31/22 [phone]   oxyCODONE-acetaminophen (PERCOCET/ROXICET) 5-325 MG per tablet 1 tablet  1 tablet Oral Q4H PRN Sharen Hones, MD   1 tablet at 03/30/22 0839   pantoprazole (PROTONIX) EC tablet 40 mg  40 mg Oral BID Sharen Hones, MD   40 mg at 03/31/22 1021   traZODone (DESYREL) tablet 50 mg  50 mg Oral QHS PRN Athena Masse, MD   50 mg at 03/30/22 2132     Objective: Vital signs in last 24 hours: Temp:  [97.6 F (36.4 C)-99.5 F (37.5 C)] 98 F (36.7 C) (11/19 0831) Pulse Rate:  [70-75] 72 (11/19 0831) Resp:  [18] 18 (11/19 0508) BP: (115-131)/(74-81) 115/81 (11/19 0831) SpO2:  [98 %-100 %] 98 % (11/19 0831)  Intake/Output from previous day: 11/18 0701 - 11/19 0700 In: 1107.9 [P.O.:720; IV Piggyback:387.9] Out: 4100 [Urine:4100] Intake/Output this shift: No intake/output data recorded.   Physical Exam Vitals reviewed.  Constitutional:      Appearance: He is well-developed.  Neurological:  Mental Status: He is alert.     Lab Results:  Recent Labs    03/30/22 0403 03/31/22 0529  WBC 7.6 7.2  HGB 11.1* 11.7*  HCT 33.3* 34.4*  PLT 151 166    BMET Recent Labs    03/30/22 0403 03/31/22 0529  NA 142 142  K 3.6 3.6  CL 113* 112*  CO2 24 24  GLUCOSE 78 90  BUN 16 13  CREATININE 0.81 0.72  CALCIUM 8.4* 8.3*    PT/INR Recent Labs    03/30/22 0403  LABPROT 16.2*  INR 1.3*    ABG No results for input(s): "PHART", "HCO3" in the last 72 hours.  Invalid input(s): "PCO2", "PO2"  Studies/Results: No results found.  Images reviewed.   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  His continues to do well and his Cr is down further.  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 Amalia Hailey, his primary urologist after discharge.  I have notified him via the Outside Message function in Epic but he does have follow up scheduled with Dr. Amalia Hailey in December.   Left hydrocele s/p repair.  He has no associated complaints.   Sepsis with diverticulitis and microperforation.  He is afebrile without abdominal pain.  Surgery managing this.      LOS: 2 days    Donald Barry 11/19/2023Patient ID: Donald Barry, male   DOB: [birth year], 76 y.o.   MRN: [id number] Patient ID: Donald Barry, male   DOB: 12-11-1945, 76 y.o.   MRN: [id number]

## 2022-04-01 ENCOUNTER — Other Ambulatory Visit: Payer: Self-pay

## 2022-04-01 DIAGNOSIS — K5720 Diverticulitis of large intestine with perforation and abscess without bleeding: Secondary | ICD-10-CM | POA: Diagnosis not present

## 2022-04-01 DIAGNOSIS — N390 Urinary tract infection, site not specified: Secondary | ICD-10-CM | POA: Diagnosis not present

## 2022-04-01 DIAGNOSIS — D508 Other iron deficiency anemias: Secondary | ICD-10-CM

## 2022-04-01 DIAGNOSIS — N10 Acute tubulo-interstitial nephritis: Secondary | ICD-10-CM | POA: Diagnosis not present

## 2022-04-01 DIAGNOSIS — A419 Sepsis, unspecified organism: Secondary | ICD-10-CM | POA: Diagnosis not present

## 2022-04-01 LAB — IRON AND TIBC
Iron: 31 ug/dL — ABNORMAL LOW (ref 45–182)
Saturation Ratios: 16 % — ABNORMAL LOW (ref 17.9–39.5)
TIBC: 192 ug/dL — ABNORMAL LOW (ref 250–450)
UIBC: 161 ug/dL

## 2022-04-01 MED ORDER — METRONIDAZOLE 500 MG PO TABS: 500.0000 mg | ORAL_TABLET | Freq: Two times a day (BID) | ORAL | 0 refills | Status: AC

## 2022-04-01 MED ORDER — APIXABAN 5 MG PO TABS: 5.0000 mg | ORAL_TABLET | Freq: Two times a day (BID) | ORAL | Status: DC

## 2022-04-01 MED ORDER — CEPHALEXIN 500 MG PO CAPS: 500.0000 mg | ORAL_CAPSULE | Freq: Three times a day (TID) | ORAL | 0 refills | Status: AC

## 2022-04-01 NOTE — Discharge Summary (Signed)
Physician Discharge Summary   Patient: Donald Barry MRN: [id number] DOB: 05-15-1945  Admit date:     03/29/2022  Discharge date: 04/01/22  Discharge Physician: Sharen Hones   PCP: Derinda Late, MD   Recommendations at discharge:   Follow-up with PCP in 1 week. Follow-up with general surgery as scheduled. Follow-up with urology as scheduled.   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 *  Hospital Course: Donald Barry is a 76 y.o. 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  Patient had temperature 101.9, pulse rate 102, leukocytosis 11800.   CT scan abdomen/pelvis showed acute sigmoid diverticulitis with evidence of perforation.  Postsurgical changes with ilieoconduit and a cystectomy.  Left hydronephrosis with a mild periureteric inflammation stranding suggest possible pyelonephritis. Patient has been evaluated by urology, no intervention needed.  Patient also has been reevaluated by general surgery, recommend antibiotics and clinical follow-up.  Patient will be placed on n.p.o. in case needed surgery. Patient is treated with Flagyl and cefepime.  Blood culture came back no growth.  Urine culture with  Klebsiella.  Condition gradually improving, started liquid diet 2 days ago, soft diet since yesterday.  Patient tolerated well without any significant abdominal pain.  Patient has been followed by general surgery, no need for intervention.  Cleared for discharge today.  Patient will be followed by general surgery and PCP as outpatient.   Assessment and Plan: Sepsis ([place]) Perforation of sigmoid colon due to diverticulitis Sepsis criteria includes fever, tachycardia, leukocytosis and source of infection CT showing perforated sigmoid diverticulitis without obstruction, intra-abdominal abscess formation or gross free intraperitoneal gas. Condition much improved, patient never had a symptoms or signs suggest acute peritonitis.  Patient is treated with cefepime and Flagyl, condition so far had improved.  He was started on soft diet yesterday, he tolerated well.  General surgery has cleared patient for discharge,   Acute pyelonephritis secondary to Klebsiella. S/p hydrocelectomy 03/15/2022 Bilateral hydronephrosis History of cystectomy with ileal conduit and urostomy 12/2020 secondary to end-stage interstitial cystitis Urine culture sent out, but limited value due to ileoconduit.  Blood cultures so far no growth, urine culture growing Klebsiella pneumoniae.   We will continue Keflex for additional 7 days.  Patient will be followed with urology outpatient.   Paroxysmal atrial fibrillation (HCC) Chronic anticoagulation Continue metoprolol  Resume anticoagulation.   OSA on CPAP Continue CPAP         Consultants: Urology and general surgery. Procedures performed: None  Disposition: Home Diet recommendation:  Discharge Diet Orders (From admission, onward)     Start     Ordered   04/01/22 0000  Diet - low sodium heart healthy        04/01/22 1125           Cardiac diet  DISCHARGE MEDICATION: Allergies as of 04/01/2022       Reactions   Tape Dermatitis   Skin blistered- steri  strip Skin blistered   Wound Dressing Adhesive Dermatitis   Skin blistered- steri strip   Shellfish Allergy Diarrhea, Nausea And Vomiting   Shrimp   Shellfish-derived Products Diarrhea, Nausea And Vomiting, Nausea Only   Onion Other (See Comments)   diarrhea Diarrhea- verified avoids all onion ingredient (including onion powder). MM, RD 12/19/20   Other Nausea And Vomiting   general anesthesia   Codeine Nausea Only        Medication List     STOP taking these medications    methylPREDNISolone 4 MG Tbpk tablet Commonly known as: MEDROL DOSEPAK       TAKE these medications    apixaban 5 MG Tabs tablet Commonly known as: ELIQUIS Take 1 tablet (5 mg total) by mouth 2 (two) times daily.   atorvastatin 10 MG tablet Commonly known as: LIPITOR Take 1 tablet by mouth daily.   cephALEXin 500 MG capsule Commonly known as: KEFLEX Take 1 capsule (500 mg total) by mouth 3 (three) times daily for 7 days.   CoQ10 100 MG Caps Take 1 capsule by mouth daily.   ferrous sulfate 325 (65 FE) MG EC tablet TAKE 1 TABLET BY MOUTH EVERY DAY   fluticasone 50 MCG/ACT nasal spray Commonly known as: FLONASE Place 1 spray into both nostrils daily.   guaiFENesin 600 MG 12 hr tablet Commonly known as: MUCINEX Take by mouth 2 (two) times daily.   HYDROcodone-acetaminophen 5-325 MG tablet Commonly known as: NORCO/VICODIN Take 1 tablet by mouth every 6 (six) hours as needed.   loratadine 10 MG tablet Commonly known as: CLARITIN Take 1 tablet by mouth daily.   metoprolol tartrate 25 MG tablet Commonly known as: LOPRESSOR Take 0.5 tablets (12.5 mg total) by mouth 2 (two) times daily.   metroNIDAZOLE 500 MG tablet Commonly known as: Flagyl Take 1 tablet (500 mg total) by mouth 2 (two) times daily for 7 days.   multivitamin with minerals Tabs tablet Take 1 tablet by mouth daily. Iron Free   NON FORMULARY Place 1 Dose/kg into the nose daily as needed. Netty pot for sinuses    omeprazole 40 MG capsule Commonly known as: PRILOSEC Take 1 capsule by mouth daily.   traZODone 50 MG tablet Commonly known as: DESYREL Take 50 mg by mouth at bedtime as needed.        Follow-up Information     Piscoya, Jacqulyn Bath, MD. Schedule an appointment as soon as possible for a visit in 3 week(s).   Specialty: General Surgery Why: Hospital follow up; diverticulitis Contact information: [address] [place] [address] [phone]         Derinda Late, MD Follow up in 1 week(s).   Specialty: Family Medicine Contact information: [address] [facility] [place] [address] [phone]                Discharge Exam: Danley Danker Weights   03/29/22 0030 03/29/22 0619  Weight: 83 kg 87.4 kg   General exam: Appears calm and comfortable  Respiratory system: Clear to auscultation. Respiratory effort normal. Cardiovascular system: S1 & S2 heard, RRR. No JVD, murmurs, rubs, gallops or clicks. No pedal edema. Gastrointestinal system: Abdomen is nondistended, soft and nontender. No organomegaly or masses felt. Normal bowel sounds heard. Central nervous system: Alert and oriented. No focal neurological deficits. Extremities:  Symmetric 5 x 5 power. Skin: No rashes, lesions or ulcers Psychiatry: Judgement and insight appear normal. Mood & affect appropriate.    Condition at discharge: good  The results of significant diagnostics from this hospitalization (including imaging, microbiology, ancillary and laboratory) are listed below for reference.   Imaging Studies: CT ABDOMEN PELVIS W CONTRAST  Result Date: 03/29/2022 CLINICAL DATA:  Left lower quadrant abdominal pain, chills EXAM: CT ABDOMEN AND PELVIS WITH CONTRAST TECHNIQUE: Multidetector CT imaging of the abdomen and pelvis was performed using the standard protocol following bolus administration of intravenous contrast. RADIATION DOSE REDUCTION: This  exam was performed according to the departmental dose-optimization program which includes automated exposure control, adjustment of the mA and/or kV according to patient size and/or use of iterative reconstruction technique. CONTRAST:  188m OMNIPAQUE IOHEXOL 300 MG/ML  SOLN COMPARISON:  08/10/2021 FINDINGS: Lower chest: Bibasilar atelectasis. Cardiac size within normal limits. Large hiatal hernia. Hepatobiliary: Cholelithiasis without pericholecystic inflammatory change. Tiny probable cyst within the right hepatic dome. Liver otherwise unremarkable. No intra or extrahepatic biliary ductal dilation. Pancreas: Unremarkable Spleen: Unremarkable Adrenals/Urinary Tract: The adrenal glands are unremarkable. Surgical changes of cystectomy and ileal conduit formation are identified with a right mid abdominal urostomy identified. Small fat containing parastomal hernia. The kidneys are normal in size and position. There is mild right and moderate left hydronephrosis with delayed left renal cortical enhancement noted related to changes of obstructive uropathy. There is, additionally, superimposed mild left periureteric inflammatory stranding and marked urothelial enhancement suggesting superimposed infection. Ureteral dilation bilaterally extends to the level of the ureteral anastomoses. Immediately adjacent to the distal left ureter are extensive inflammatory changes and punctate foci of extraluminal gas in keeping with changes of acute diverticulitis described below. No perinephric fluid collections are identified. No intrarenal or ureteral calculi are seen. The conduit itself is decompressed. Stomach/Bowel: Moderate sigmoid diverticulosis. There is focal circumferential colonic wall thickening and extensive pericolonic inflammatory stranding with punctate foci of extraluminal gas adjacent to the mid sigmoid colon in keeping with changes of acute, perforated sigmoid diverticulitis. No evidence of obstruction. No gross free  intraperitoneal gas. No free intraperitoneal fluid or loculated intra-abdominal fluid collections. The stomach, small bowel, and large bowel are otherwise unremarkable. Appendix normal. Vascular/Lymphatic: Mild aortoiliac atherosclerotic calcification. No aortic aneurysm. No pathologic adenopathy within the abdomen and pelvis. Reproductive: The prostate gland and seminal vesicles are unremarkable. There is scrotal wall thickening noted. There is punctate gas within the left hemiscrotum, likely postsurgical in nature given the history of recent hydrocelectomy. Small left varicocele noted. Other: None Musculoskeletal: Osseous structures are age-appropriate. No acute bone abnormality. IMPRESSION: 1. Acute, perforated sigmoid diverticulitis. No evidence of obstruction, intra-abdominal abscess formation, or gross free intraperitoneal gas. 2. Surgical changes of cystectomy and ileal conduit formation with a right mid abdominal urostomy. Mild right and moderate left hydronephrosis with delayed left renal cortical enhancement related to changes of obstructive uropathy. Superimposed mild left periureteric inflammatory stranding and marked urothelial enhancement suggesting superimposed infection. Correlation with urinalysis and urine culture may be helpful. 3. Cholelithiasis. 4. Large hiatal hernia. 5. Scrotal wall thickening with punctate gas within the left hemiscrotum, likely postsurgical in nature given the history of recent hydrocelectomy. Small left varicocele noted. 6.  Aortic Atherosclerosis (ICD10-I70.0). Electronically Signed   By: AFidela SalisburyM.D.   On: 03/29/2022 04:22   UKoreaSCROTUM W/DOPPLER  Result Date: 03/29/2022 CLINICAL DATA:  Left testicular pain, status post left hydrocelectomy EXAM: SCROTAL ULTRASOUND DOPPLER ULTRASOUND OF THE TESTICLES TECHNIQUE: Complete ultrasound  examination of the testicles, epididymis, and other scrotal structures was performed. Color and spectral Doppler ultrasound were also  utilized to evaluate blood flow to the testicles. COMPARISON:  None Available. FINDINGS: Right testicle Measurements: 3.6 x 2.4 x 2.4 cm. Normal echogenicity and echotexture. Normal color flow vascularity. No mass or microlithiasis visualized. Left testicle Measurements: 4.4 x 2.5 x 2.7 cm. There is diffuse mild parenchymal edema of the left testis. Color flow vascularity is normal. No mass or microlithiasis visualized. Right epididymis:  Normal in size and appearance. Left epididymis: The left epididymis is markedly thickened and hypervascular, best appreciated on image # 50 and cine sequence # 2 suggesting changes of acute epididymitis. There is and echogenic linear shadowing structure within the left hemiscrotum adjacent to the epididymis demonstrating "dirty shadowing" which may represent a small amount of gas within the left hemiscrotum, less likely a postsurgical implant. Hydrocele:  Trace right hydrocele noted Varicocele:  None visualized. Pulsed Doppler interrogation of both testes demonstrates normal low resistance arterial and venous waveforms bilaterally. Other: There is marked scrotal wall thickening and hypervascularity noted, asymmetrically more severe involving the left hemiscrotum, possibly postsurgical in nature though a superimposed acute inflammatory process such as cellulitis could appear similarly. IMPRESSION: 1. Findings most consistent with acute left epididymo-orchitis. 2. Marked left scrotal wall thickening and hypervascularity, asymmetrically more severe involving the left hemiscrotum, possibly postsurgical in nature though a superimposed acute inflammatory process such as cellulitis could appear similarly. 3. Linear echogenic shadowing structure within the left hemiscrotum adjacent to the epididymis demonstrating ultrasonographic shadowing which may represent a small amount of gas within the left hemiscrotum, less likely a postsurgical implant. 4. No evidence of testicular infarction  Electronically Signed   By: Fidela Salisbury M.D.   On: 03/29/2022 03:59    Microbiology: Results for orders placed or performed during the hospital encounter of 03/29/22  Urine Culture     Status: Abnormal   Collection Time: 03/29/22  3:09 AM   Specimen: Urine, Random  Result Value Ref Range Status   Specimen Description   Final    URINE, RANDOM Performed at [facility], [address] [place], [place] [address]    Special Requests   Final    NONE Performed at [facility], [place], [place] [address]    Culture 20,000 COLONIES/mL KLEBSIELLA PNEUMONIAE (A)  Final   Report Status 03/31/2022 FINAL  Final   Organism ID, Bacteria KLEBSIELLA PNEUMONIAE (A)  Final      Susceptibility   Klebsiella pneumoniae - MIC*    AMPICILLIN >=32 RESISTANT Resistant     CEFAZOLIN <=4 SENSITIVE Sensitive     CEFEPIME <=0.12 SENSITIVE Sensitive     CEFTRIAXONE <=0.25 SENSITIVE Sensitive     CIPROFLOXACIN <=0.25 SENSITIVE Sensitive     GENTAMICIN <=1 SENSITIVE Sensitive     IMIPENEM <=0.25 SENSITIVE Sensitive     NITROFURANTOIN 64 INTERMEDIATE Intermediate     TRIMETH/SULFA <=20 SENSITIVE Sensitive     AMPICILLIN/SULBACTAM 4 SENSITIVE Sensitive     PIP/TAZO <=4 SENSITIVE Sensitive     * 20,000 COLONIES/mL KLEBSIELLA PNEUMONIAE  Blood culture (routine x 2)     Status: None (Preliminary result)   Collection Time: 03/29/22  3:09 AM   Specimen: BLOOD  Result Value Ref Range Status   Specimen Description BLOOD LEFT FOREARM  Final   Special Requests   Final    BOTTLES DRAWN AEROBIC AND ANAEROBIC Blood Culture adequate volume   Culture   Final  NO GROWTH 3 DAYS Performed at [facility], [address] [place], [place] [address]    Report Status PENDING  Incomplete  Blood culture (routine x 2)     Status: None (Preliminary result)   Collection Time: 03/29/22  3:09 AM   Specimen: BLOOD  Result Value Ref Range Status   Specimen Description BLOOD RIGHT  HAND  Final   Special Requests   Final    BOTTLES DRAWN AEROBIC AND ANAEROBIC Blood Culture results may not be optimal due to an inadequate volume of blood received in culture bottles   Culture   Final    NO GROWTH 3 DAYS Performed at [facility], [address] [place], [place] [address]    Report Status PENDING  Incomplete  Resp Panel by RT-PCR (Flu A&B, Covid) Anterior Nasal Swab     Status: None   Collection Time: 03/29/22  3:52 AM   Specimen: Anterior Nasal Swab  Result Value Ref Range Status   SARS Coronavirus 2 by RT PCR NEGATIVE NEGATIVE Final    Comment: (NOTE) SARS-CoV-2 target nucleic acids are NOT DETECTED.  The SARS-CoV-2 RNA is generally detectable in upper respiratory specimens during the acute phase of infection. The lowest concentration of SARS-CoV-2 viral copies this assay can detect is 138 copies/mL. A negative result does not preclude SARS-Cov-2 infection and should not be used as the sole basis for treatment or other patient management decisions. A negative result may occur with  improper specimen collection/handling, submission of specimen other than nasopharyngeal swab, presence of viral mutation(s) within the areas targeted by this assay, and inadequate number of viral copies(<138 copies/mL). A negative result must be combined with clinical observations, patient history, and epidemiological information. The expected result is Negative.  Fact Sheet for Patients:  EntrepreneurPulse.com.au  Fact Sheet for Healthcare Providers:  IncredibleEmployment.be  This test is no t yet approved or cleared by the [place] FDA and  has been authorized for detection and/or diagnosis of SARS-CoV-2 by FDA under an Emergency Use Authorization (EUA). This EUA will remain  in effect (meaning this test can be used) for the duration of the COVID-19 declaration under Section 564(b)(1) of the Act, 21 U.S.C.section  360bbb-3(b)(1), unless the authorization is terminated  or revoked sooner.       Influenza A by PCR NEGATIVE NEGATIVE Final   Influenza B by PCR NEGATIVE NEGATIVE Final    Comment: (NOTE) The Xpert Xpress SARS-CoV-2/FLU/RSV plus assay is intended as an aid in the diagnosis of influenza from Nasopharyngeal swab specimens and should not be used as a sole basis for treatment. Nasal washings and aspirates are unacceptable for Xpert Xpress SARS-CoV-2/FLU/RSV testing.  Fact Sheet for Patients: EntrepreneurPulse.com.au  Fact Sheet for Healthcare Providers: IncredibleEmployment.be  This test is not yet approved or cleared by the [place] FDA and has been authorized for detection and/or diagnosis of SARS-CoV-2 by FDA under an Emergency Use Authorization (EUA). This EUA will remain in effect (meaning this test can be used) for the duration of the COVID-19 declaration under Section 564(b)(1) of the Act, 21 U.S.C. section 360bbb-3(b)(1), unless the authorization is terminated or revoked.  Performed at [facility], [address] [place], [place] [address]     Labs: CBC: Recent Labs  Lab 03/29/22 0037 03/30/22 0403 03/31/22 0529  WBC 11.8* 7.6 7.2  NEUTROABS 9.3*  --   --   HGB 13.4 11.1* 11.7*  HCT 40.2 33.3* 34.4*  MCV 88.9 89.3 88.2  PLT 212  151 757   Basic Metabolic Panel: Recent Labs  Lab 03/29/22 0037 03/30/22 0403 03/31/22 0529  NA 140 142 142  K 4.3 3.6 3.6  CL 107 113* 112*  CO2 '25 24 24  '$ GLUCOSE 104* 78 90  BUN '23 16 13  '$ CREATININE 1.06 0.81 0.72  CALCIUM 9.2 8.4* 8.3*  MG  --  1.6* 2.0   Liver Function Tests: Recent Labs  Lab 03/29/22 0037  AST 21  ALT 15  ALKPHOS 35*  BILITOT 0.6  PROT 7.5  ALBUMIN 3.7   CBG: No results for input(s): "GLUCAP" in the last 168 hours.  Discharge time spent: greater than 30 minutes.  Signed: Sharen Hones, MD [facility] 04/01/2022

## 2022-04-01 NOTE — Plan of Care (Signed)
  Problem: Clinical Measurements: Goal: Will remain free from infection Outcome: Progressing   Problem: Clinical Measurements: Goal: Diagnostic test results will improve Outcome: Progressing   Problem: Nutrition: Goal: Adequate nutrition will be maintained Outcome: Progressing   Problem: Activity: Goal: Risk for activity intolerance will decrease Outcome: Progressing   Problem: Pain Managment: Goal: General experience of comfort will improve Outcome: Progressing

## 2022-04-01 NOTE — Discharge Instructions (Signed)
In addition to included general instructions,  From diverticulitis perspective....  Diet: Recommend following low fiber diet for the first few weeks while recovering from this episodes of diverticulitis. After 4-6 weeks, transition to high fiber diet and avoid constipation. .   Call office [phone]) at any time if any questions, worsening pain, fevers/chills

## 2022-04-01 NOTE — Progress Notes (Signed)
[facility] SURGICAL PROGRESS NOTE (cpt [phone])  Hospital Day(s): 3.   Interval History: Patient seen and examined, no acute events or new complaints overnight. Patient reports he is doing well; significant scrotal swelling is bigger issue. Minimal LLQ pain. No fever, chills, nausea, emesis. No new labs this morning. Good UO from ileo conduit. He was advanced to soft diet; tolerating. Continues on Cefepime + flagyl.  Review of Systems:  Constitutional: denies fever, chills  HEENT: denies cough or congestion  Respiratory: denies any shortness of breath  Cardiovascular: denies chest pain or palpitations  Gastrointestinal: denies abdominal pain, N/V Genitourinary: denies burning with urination or urinary frequency Musculoskeletal: denies pain, decreased motor or sensation  Vital signs in last 24 hours: [min-max] current  Temp:  [98 F (36.7 C)-98.8 F (37.1 C)] 98.6 F (37 C) (11/20 0442) Pulse Rate:  [71-73] 72 (11/20 0442) Resp:  [18] 18 (11/20 0442) BP: (108-119)/(65-81) 117/75 (11/20 0442) SpO2:  [96 %-100 %] 97 % (11/20 0442)     Height: 6' (182.9 cm) Weight: 87.4 kg BMI (Calculated): 26.13   Intake/Output last 2 shifts:  11/19 0701 - 11/20 0700 In: 528.4 [P.O.:120; IV Piggyback:408.4] Out: 2150 [Urine:2150]   Physical Exam:  Constitutional: alert, cooperative and no distress  HENT: normocephalic without obvious abnormality  Eyes: PERRL, EOM's grossly intact and symmetric  Respiratory: breathing non-labored at rest  Cardiovascular: regular rate and sinus rhythm  Gastrointestinal: Soft, non-tender, non-distended, no rebound/guarding. Ileal conduit in RLQ; urine in bag\ Genitourinary: Scrotal support in place  Musculoskeletal: no edema or wounds, motor and sensation grossly intact, NT    Labs:     Latest Ref Rng & Units 03/31/2022    5:29 AM 03/30/2022    4:03 AM 03/29/2022   12:37 AM  CBC  WBC 4.0 - 10.5 K/uL 7.2  7.6  11.8   Hemoglobin 13.0 - 17.0  g/dL 11.7  11.1  13.4   Hematocrit 39.0 - 52.0 % 34.4  33.3  40.2   Platelets 150 - 400 K/uL 166  151  212       Latest Ref Rng & Units 03/31/2022    5:29 AM 03/30/2022    4:03 AM 03/29/2022   12:37 AM  CMP  Glucose 70 - 99 mg/dL 90  78  104   BUN 8 - 23 mg/dL '13  16  23   '$ Creatinine 0.61 - 1.24 mg/dL 0.72  0.81  1.06   Sodium 135 - 145 mmol/L 142  142  140   Potassium 3.5 - 5.1 mmol/L 3.6  3.6  4.3   Chloride 98 - 111 mmol/L 112  113  107   CO2 22 - 32 mmol/L '24  24  25   '$ Calcium 8.9 - 10.3 mg/dL 8.3  8.4  9.2   Total Protein 6.5 - 8.1 g/dL   7.5   Total Bilirubin 0.3 - 1.2 mg/dL   0.6   Alkaline Phos 38 - 126 U/L   35   AST 15 - 41 U/L   21   ALT 0 - 44 U/L   15      Imaging studies: No new pertinent imaging studies   Assessment/Plan: (ICD-10's: K57.92) 76 y.o. male with uncomplicated diverticulitis with possible microperforation without gross free air nor abscess    - Okay to continue diet as tolerated - Recommend completion of 7 days PO Abx for home   - Monitor abdominal examination; on-going bowel function - Pain control prn; antiemetics prn    -  Mobilize as tolerated   - Further management per primary service    - Discharge Planning; Okay for discharge from surgical perspective; Abx as above. I will place follow up for a few weeks from now.   All of the above findings and recommendations were discussed with the patient, and the medical team, and all of patient's questions were answered to his expressed satisfaction.  -- Edison Simon, PA-C [facility] 04/01/2022, 7:39 AM M-F: 7am - 4pm

## 2022-04-02 ENCOUNTER — Inpatient Hospital Stay: Payer: Medicare HMO | Admitting: Oncology

## 2022-04-02 ENCOUNTER — Inpatient Hospital Stay: Payer: Medicare HMO

## 2022-04-03 LAB — CULTURE, BLOOD (ROUTINE X 2)
Culture: NO GROWTH
Culture: NO GROWTH
Special Requests: ADEQUATE

## 2022-04-08 ENCOUNTER — Telehealth: Payer: Self-pay | Admitting: *Deleted

## 2022-04-08 NOTE — Telephone Encounter (Signed)
He does not need iv iron based on these studies. Labs more suggestive of anemia of chronic disease. He will keep his appt with me next month as planned

## 2022-04-08 NOTE — Telephone Encounter (Signed)
CAll returned to patient and informed of doctor response that he does not need IV iron and it looks more like anemia of chronic disease. He said that takes a huge weight off his shoulders an tanked me for calling back

## 2022-04-08 NOTE — Telephone Encounter (Signed)
Call for patient reporting that his Iron levels checked in hospital was low and asking if he needs an infusion Please advise  Iron and TIBC Order: [id number] Status: Final result     Visible to patient: Yes (seen)     Next appt: 04/22/2022 at 11:00 AM in General Surgery Olean Ree, MD)     Dx: Other iron deficiency anemia   0 Result Notes         Component Ref Range & Units 8 d ago 4 mo ago 7 mo ago 10 mo ago 1 yr ago  Iron 45 - 182 ug/dL 31 Low  65 57 29 Low  8 Low   TIBC 250 - 450 ug/dL 192 Low  309 435 398 267  Saturation Ratios 17.9 - 39.5 % 16 Low  21 13 Low  7 Low  3 Low   UIBC ug/dL 161 244 CM 378 CM 369 CM 259 CM  Comment: Performed at [facility], [address] [place], [place] [address]  Resulting Agency  [facility] [place] [facility] [place] [facility] [place] [facility]         Specimen Collected: 03/31/22 05:27 Last Resulted: 04/01/22 09:52

## 2022-04-14 ENCOUNTER — Emergency Department: Payer: Medicare HMO

## 2022-04-14 ENCOUNTER — Other Ambulatory Visit: Payer: Self-pay

## 2022-04-14 ENCOUNTER — Observation Stay
Admission: EM | Admit: 2022-04-14 | Discharge: 2022-04-15 | Disposition: A | Discharge status: Home / Self Care | Payer: Medicare HMO | Attending: Internal Medicine | Admitting: Internal Medicine

## 2022-04-14 DIAGNOSIS — I480 Paroxysmal atrial fibrillation: Secondary | ICD-10-CM | POA: Diagnosis present

## 2022-04-14 DIAGNOSIS — Z79899 Other long term (current) drug therapy: Secondary | ICD-10-CM | POA: Diagnosis not present

## 2022-04-14 DIAGNOSIS — Z85828 Personal history of other malignant neoplasm of skin: Secondary | ICD-10-CM | POA: Diagnosis not present

## 2022-04-14 DIAGNOSIS — A4150 Gram-negative sepsis, unspecified: Principal | ICD-10-CM | POA: Diagnosis present

## 2022-04-14 DIAGNOSIS — E785 Hyperlipidemia, unspecified: Secondary | ICD-10-CM | POA: Diagnosis not present

## 2022-04-14 DIAGNOSIS — N1330 Unspecified hydronephrosis: Secondary | ICD-10-CM | POA: Diagnosis not present

## 2022-04-14 DIAGNOSIS — N10 Acute tubulo-interstitial nephritis: Secondary | ICD-10-CM | POA: Diagnosis not present

## 2022-04-14 DIAGNOSIS — N12 Tubulo-interstitial nephritis, not specified as acute or chronic: Principal | ICD-10-CM

## 2022-04-14 DIAGNOSIS — N390 Urinary tract infection, site not specified: Secondary | ICD-10-CM | POA: Diagnosis present

## 2022-04-14 DIAGNOSIS — N134 Hydroureter: Secondary | ICD-10-CM | POA: Diagnosis not present

## 2022-04-14 DIAGNOSIS — G4733 Obstructive sleep apnea (adult) (pediatric): Secondary | ICD-10-CM | POA: Diagnosis not present

## 2022-04-14 DIAGNOSIS — R1032 Left lower quadrant pain: Secondary | ICD-10-CM | POA: Diagnosis present

## 2022-04-14 DIAGNOSIS — Z7901 Long term (current) use of anticoagulants: Secondary | ICD-10-CM | POA: Insufficient documentation

## 2022-04-14 LAB — CBC
HCT: 40.2 % (ref 39.0–52.0)
HCT: 33.1 % — ABNORMAL LOW (ref 39.0–52.0)
Hemoglobin: 13.4 g/dL (ref 13.0–17.0)
Hemoglobin: 11.3 g/dL — ABNORMAL LOW (ref 13.0–17.0)
MCH: 30.1 pg (ref 26.0–34.0)
MCH: 30.5 pg (ref 26.0–34.0)
MCHC: 33.3 g/dL (ref 30.0–36.0)
MCHC: 34.1 g/dL (ref 30.0–36.0)
MCV: 90.3 fL (ref 80.0–100.0)
MCV: 89.5 fL (ref 80.0–100.0)
Platelets: 196 10*3/uL (ref 150–400)
Platelets: 148 10*3/uL — ABNORMAL LOW (ref 150–400)
RBC: 4.45 MIL/uL (ref 4.22–5.81)
RBC: 3.7 MIL/uL — ABNORMAL LOW (ref 4.22–5.81)
RDW: 14.9 % (ref 11.5–15.5)
RDW: 15 % (ref 11.5–15.5)
WBC: 12.1 10*3/uL — ABNORMAL HIGH (ref 4.0–10.5)
WBC: 9.9 10*3/uL (ref 4.0–10.5)
nRBC: 0 % (ref 0.0–0.2)
nRBC: 0 % (ref 0.0–0.2)

## 2022-04-14 LAB — PROTIME-INR
INR: 1.5 — ABNORMAL HIGH (ref 0.8–1.2)
Prothrombin Time: 18.1 seconds — ABNORMAL HIGH (ref 11.4–15.2)

## 2022-04-14 LAB — URINALYSIS, ROUTINE W REFLEX MICROSCOPIC
Bacteria, UA: NONE SEEN
Bilirubin Urine: NEGATIVE
Glucose, UA: NEGATIVE mg/dL
Ketones, ur: NEGATIVE mg/dL
Nitrite: POSITIVE — AB
Protein, ur: 100 mg/dL — AB
RBC / HPF: >50 RBC/hpf — ABNORMAL HIGH (ref 0–5)
Specific Gravity, Urine: 1.012 (ref 1.005–1.030)
Squamous Epithelial / HPF: NONE SEEN (ref 0–5)
WBC, UA: >50 WBC/hpf — ABNORMAL HIGH (ref 0–5)
pH: 7 (ref 5.0–8.0)

## 2022-04-14 LAB — BASIC METABOLIC PANEL
Anion gap: 7 (ref 5–15)
BUN: 17 mg/dL (ref 8–23)
CO2: 22 mmol/L (ref 22–32)
Calcium: 8.2 mg/dL — ABNORMAL LOW (ref 8.9–10.3)
Chloride: 111 mmol/L (ref 98–111)
Creatinine, Ser: 0.88 mg/dL (ref 0.61–1.24)
GFR, Estimated: >60 mL/min (ref >60)
Glucose, Bld: 113 mg/dL — ABNORMAL HIGH (ref 70–99)
Potassium: 3.9 mmol/L (ref 3.5–5.1)
Sodium: 140 mmol/L (ref 135–145)

## 2022-04-14 LAB — COMPREHENSIVE METABOLIC PANEL
ALT: 15 U/L (ref 0–44)
AST: 23 U/L (ref 15–41)
Albumin: 3.6 g/dL (ref 3.5–5.0)
Alkaline Phosphatase: 27 U/L — ABNORMAL LOW (ref 38–126)
Anion gap: 8 (ref 5–15)
BUN: 20 mg/dL (ref 8–23)
CO2: 23 mmol/L (ref 22–32)
Calcium: 9.1 mg/dL (ref 8.9–10.3)
Chloride: 110 mmol/L (ref 98–111)
Creatinine, Ser: 0.96 mg/dL (ref 0.61–1.24)
GFR, Estimated: >60 mL/min (ref >60)
Glucose, Bld: 97 mg/dL (ref 70–99)
Potassium: 3.5 mmol/L (ref 3.5–5.1)
Sodium: 141 mmol/L (ref 135–145)
Total Bilirubin: 0.7 mg/dL (ref 0.3–1.2)
Total Protein: 6.8 g/dL (ref 6.5–8.1)

## 2022-04-14 LAB — LACTIC ACID, PLASMA
Lactic Acid, Venous: 1.1 mmol/L (ref 0.5–1.9)
Lactic Acid, Venous: 0.9 mmol/L (ref 0.5–1.9)

## 2022-04-14 LAB — LIPASE, BLOOD: Lipase: 46 U/L (ref 11–51)

## 2022-04-14 LAB — PROCALCITONIN: Procalcitonin: <0.1 ng/mL

## 2022-04-14 LAB — CORTISOL-AM, BLOOD: Cortisol - AM: 4.4 ug/dL — ABNORMAL LOW (ref 6.7–22.6)

## 2022-04-14 MED ORDER — PIPERACILLIN-TAZOBACTAM 3.375 G IVPB 30 MIN
3.3750 g | Freq: Once | INTRAVENOUS | Status: AC
  Administered 2022-04-14: 3.375 g via INTRAVENOUS
  Filled 2022-04-14: qty 50

## 2022-04-14 MED ORDER — FLUTICASONE PROPIONATE 50 MCG/ACT NA SUSP: 1.0000 | Freq: Every day | NASAL | Status: DC | PRN

## 2022-04-14 MED ORDER — ADULT MULTIVITAMIN W/MINERALS CH
1.0000 | ORAL_TABLET | Freq: Every day | ORAL | Status: DC
  Administered 2022-04-14 – 2022-04-15 (×2): 1 via ORAL
  Filled 2022-04-14 (×2): qty 1

## 2022-04-14 MED ORDER — FERROUS SULFATE 325 (65 FE) MG PO TABS
325.0000 mg | ORAL_TABLET | Freq: Every day | ORAL | Status: DC
  Administered 2022-04-14 – 2022-04-15 (×2): 325 mg via ORAL
  Filled 2022-04-14 (×2): qty 1

## 2022-04-14 MED ORDER — ACETAMINOPHEN 650 MG RE SUPP: 650.0000 mg | Freq: Four times a day (QID) | RECTAL | Status: DC | PRN

## 2022-04-14 MED ORDER — MAGNESIUM HYDROXIDE 400 MG/5ML PO SUSP
30.0000 mL | Freq: Every day | ORAL | Status: DC | PRN
  Administered 2022-04-14: 30 mL via ORAL
  Filled 2022-04-14: qty 30

## 2022-04-14 MED ORDER — APIXABAN 5 MG PO TABS
5.0000 mg | ORAL_TABLET | Freq: Two times a day (BID) | ORAL | Status: DC
  Administered 2022-04-14 – 2022-04-15 (×3): 5 mg via ORAL
  Filled 2022-04-14 (×3): qty 1

## 2022-04-14 MED ORDER — ONDANSETRON HCL 4 MG/2ML IJ SOLN
4.0000 mg | Freq: Once | INTRAMUSCULAR | Status: AC
  Administered 2022-04-14: 4 mg via INTRAVENOUS
  Filled 2022-04-14: qty 2

## 2022-04-14 MED ORDER — ONDANSETRON HCL 4 MG PO TABS: 4.0000 mg | ORAL_TABLET | Freq: Four times a day (QID) | ORAL | Status: DC | PRN

## 2022-04-14 MED ORDER — COQ10 100 MG PO CAPS: 1.0000 | ORAL_CAPSULE | Freq: Every day | ORAL | Status: DC

## 2022-04-14 MED ORDER — PANTOPRAZOLE SODIUM 40 MG PO TBEC
40.0000 mg | DELAYED_RELEASE_TABLET | Freq: Every day | ORAL | Status: DC
  Administered 2022-04-14 – 2022-04-15 (×2): 40 mg via ORAL
  Filled 2022-04-14 (×2): qty 1

## 2022-04-14 MED ORDER — SODIUM CHLORIDE 0.9 % IV SOLN: INTRAVENOUS | Status: DC

## 2022-04-14 MED ORDER — ACETAMINOPHEN 325 MG PO TABS
650.0000 mg | ORAL_TABLET | Freq: Four times a day (QID) | ORAL | Status: DC | PRN
  Administered 2022-04-14: 650 mg via ORAL
  Filled 2022-04-14: qty 2

## 2022-04-14 MED ORDER — FENTANYL CITRATE PF 50 MCG/ML IJ SOSY
50.0000 ug | PREFILLED_SYRINGE | Freq: Once | INTRAMUSCULAR | Status: AC
  Administered 2022-04-14: 50 ug via INTRAVENOUS
  Filled 2022-04-14: qty 1

## 2022-04-14 MED ORDER — ATORVASTATIN CALCIUM 20 MG PO TABS
10.0000 mg | ORAL_TABLET | Freq: Every day | ORAL | Status: DC
  Administered 2022-04-14 – 2022-04-15 (×2): 10 mg via ORAL
  Filled 2022-04-14 (×2): qty 1

## 2022-04-14 MED ORDER — HYDROCODONE-ACETAMINOPHEN 5-325 MG PO TABS: 1.0000 | ORAL_TABLET | Freq: Four times a day (QID) | ORAL | Status: DC | PRN

## 2022-04-14 MED ORDER — METOPROLOL TARTRATE 25 MG PO TABS
12.5000 mg | ORAL_TABLET | Freq: Two times a day (BID) | ORAL | Status: DC
  Administered 2022-04-14 – 2022-04-15 (×2): 12.5 mg via ORAL
  Filled 2022-04-14 (×3): qty 1

## 2022-04-14 MED ORDER — ACETAMINOPHEN 500 MG PO TABS
1000.0000 mg | ORAL_TABLET | Freq: Once | ORAL | Status: AC
  Administered 2022-04-14: 1000 mg via ORAL
  Filled 2022-04-14: qty 2

## 2022-04-14 MED ORDER — TRAZODONE HCL 50 MG PO TABS: 50.0000 mg | ORAL_TABLET | Freq: Every evening | ORAL | Status: DC | PRN

## 2022-04-14 MED ORDER — LORATADINE 10 MG PO TABS
10.0000 mg | ORAL_TABLET | Freq: Every day | ORAL | Status: DC
  Administered 2022-04-15: 10 mg via ORAL
  Filled 2022-04-14 (×2): qty 1

## 2022-04-14 MED ORDER — IOHEXOL 300 MG/ML  SOLN
100.0000 mL | Freq: Once | INTRAMUSCULAR | Status: AC | PRN
  Administered 2022-04-14: 100 mL via INTRAVENOUS

## 2022-04-14 MED ORDER — ONDANSETRON HCL 4 MG/2ML IJ SOLN: 4.0000 mg | Freq: Four times a day (QID) | INTRAMUSCULAR | Status: DC | PRN

## 2022-04-14 MED ORDER — SODIUM CHLORIDE 0.9 % IV SOLN
2.0000 g | INTRAVENOUS | Status: DC
  Administered 2022-04-14 – 2022-04-15 (×2): 2 g via INTRAVENOUS
  Filled 2022-04-14 (×2): qty 20

## 2022-04-14 NOTE — Assessment & Plan Note (Signed)
-   The patient will be admitted to a medical telemetry bed. - We will continue antibiotic therapy with IV Rocephin as it showed sensitivity to previously grown Klebsiella pneumoniae on 03/29/2022. - We will continue hydration with IV normal saline. - We will follow blood and urine cultures. - We will obtain a urology consult given recurrence of UTI with pyelonephritis and sepsis in less than a month. - I notified Dr. Claudia Desanctis about the patient.

## 2022-04-14 NOTE — ED Triage Notes (Signed)
LLQ abd pain. Reports recent admission for perforated bowel and sepsis. States discharged 2 wks ago. Reports nausea w/o emesis. Pt alert and oriented following commands. Reports normal urine output in foley. Denies diarrhea.

## 2022-04-14 NOTE — Assessment & Plan Note (Signed)
-   We will continue statin therapy. 

## 2022-04-14 NOTE — Progress Notes (Signed)
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  He is also status post left hydrocelectomy last month.  He was was admitted early this morning with LLQ pain with nausea and vomiting and cloudy urine.  Workup in the ED revealed complicated UTI and sepsis.  Patient was evaluated by urology and started on ceftriaxone.  Patient seen this morning with wife and daughter at bedside.  He states he feels markedly improved.'s notes that LLQ pain is resolved.  Also notes that nausea is much improved and he has not had any further vomiting.  Physical exam is notable for relatively well-appearing gentleman with attentive family at bedside.  Abdominal exam is benign with no elicited tenderness to either light or deep palpation.  Complicated UTI/pyelonephritis with sepsis Patient has responded very well to IV fluid resuscitation and ceftriaxone Urology has evaluated the patient and they note that he may have a potential stricture at the ureteral anastomosis but at this point they are recommending outpatient follow-up with Dr. Stevie Kern has an appointment with him on 04/16/2022 at [facility].  Urology is hoping that patient can be discharged home in time to make that outpatient appointment. Sepsis physiology seems to have resolved  Atrial fibrillation Rate is well-controlled on metoprolol She is continued on Eliquis

## 2022-04-14 NOTE — Consult Note (Signed)
I have been asked to see the patient by Dr. Eugenie Norrie for evaluation and management of UTI/pyelonephritis.  History of present illness: 76 yo man with complex urologic history.  He has been taking care of by Dr. Amalia Hailey at [facility].  He has a history of severe interstitial cystitis and ultimately underwent open cystectomy with urinary diversion with ileal conduit in August 2022.  He also underwent a left hydrocelectomy by Dr. Amalia Hailey on 03/15/2022. He was last evaluated by Urology on 03/29/22 while presenting with acute perforated diverticulitis.  He now returns with acute onset left lower quadrant pain that began on 04/12/2022.  Repeat CT the abdomen pelvis showed increased left pelviectasis with perinephric stranding and stable bilateral hydroureter.  Patient's pain is since resolved.  Blood cultures up-to-date do not show any growth but are still pending.  He does have an appoint with Dr. Amalia Hailey on 04/16/2022.  Urine culture during last hospitalization showed Klebsiella resistant to ampicillin and intermediate sensitivity to nitrofurantoin.  He was ultimately discharged with Keflex.  Review of systems: A 12 point comprehensive review of systems was obtained and is negative unless otherwise stated in the history of present illness.  Patient Active Problem List   Diagnosis Date Noted   Sepsis due to gram-negative UTI ([place]) 04/14/2022   Dyslipidemia 04/14/2022   Hypomagnesemia 03/30/2022   Paroxysmal atrial fibrillation ([place]) 03/29/2022   Chronic anticoagulation 03/29/2022   s/p hydrocelectomy 03/15/22 03/29/2022   Perforation of sigmoid colon due to diverticulitis 03/29/2022   Bilateral hydronephrosis 41/93/7902   Complicated UTI (urinary tract infection) 03/29/2022   Sepsis secondary to UTI ([place]) 03/29/2022   Diverticulitis of colon 03/29/2022   Swelling of left half of scrotum 12/31/2021   Iron deficiency anemia 08/28/2021   Diverticulosis of colon 08/14/2021   History of  iron deficiency anemia 08/14/2021   MGUS (monoclonal gammopathy of unknown significance) 05/21/2021   OSA on CPAP 05/21/2021   CPAP use counseling 05/21/2021   Acute pyelonephritis 01/13/2021   Sepsis ([place]) 01/12/2021   Hx of cystectomy with lileal conduit 12/2020 secondary to interstitial cystitis 12/21/2020   Primary osteoarthritis of right knee 09/02/2019   Primary osteoarthritis of right shoulder 09/02/2019   Interstitial cystitis 03/04/2019   Thrombocytopenia ([place]) 09/30/2018   Aortic atherosclerosis ([place]) 07/13/2018   Hepatic steatosis 04/05/2018   Hiatal hernia 09/09/2017   Hx of adenomatous colonic polyps 07/15/2017   Intermittent left lower quadrant abdominal pain 07/15/2017   Allergic rhinitis, seasonal 06/14/2014   Chronic neck pain 06/14/2014   Gastroesophageal reflux disease without esophagitis 06/14/2014    No current facility-administered medications on file prior to encounter.   Current Outpatient Medications on File Prior to Encounter  Medication Sig Dispense Refill   apixaban (ELIQUIS) 5 MG TABS tablet Take 1 tablet (5 mg total) by mouth 2 (two) times daily. 60 tablet 11   atorvastatin (LIPITOR) 10 MG tablet Take 1 tablet by mouth daily.     Coenzyme Q10 (COQ10) 100 MG CAPS Take 1 capsule by mouth daily.     ferrous sulfate 325 (65 FE) MG EC tablet TAKE 1 TABLET BY MOUTH EVERY DAY 90 tablet 1   fluticasone (FLONASE) 50 MCG/ACT nasal spray Place 1 spray into both nostrils daily.     guaiFENesin (MUCINEX) 600 MG 12 hr tablet Take by mouth 2 (two) times daily.     loratadine (CLARITIN) 10 MG tablet Take 1 tablet by mouth daily.     metoprolol tartrate (LOPRESSOR) 25 MG tablet  Take 0.5 tablets (12.5 mg total) by mouth 2 (two) times daily. 30 tablet 0   Multiple Vitamin (MULTIVITAMIN WITH MINERALS) TABS tablet Take 1 tablet by mouth daily. Iron Free     omeprazole (PRILOSEC) 40 MG capsule Take 1 capsule by mouth daily.     HYDROcodone-acetaminophen (NORCO/VICODIN) 5-325  MG tablet Take 1 tablet by mouth every 6 (six) hours as needed.     NON FORMULARY Place 1 Dose/kg into the nose daily as needed. Netty pot for sinuses     traZODone (DESYREL) 50 MG tablet Take 50 mg by mouth at bedtime as needed.      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 SCCis   Squamous cell skin cancer 07/2017   Resected from scalp.    Past Surgical History:  Procedure Laterality Date   BACK SURGERY  2008   L4, L5   COLONOSCOPY     cystectomy with prostatectomy ileal conduit     CYSTO WITH HYDRODISTENSION N/A 06/17/2019   Procedure: CYSTOSCOPY/HYDRODISTENSION;  Surgeon: Billey Co, MD;  Location: [facility] ORS;  Service: Urology;  Laterality: N/A;   CYSTOSCOPY WITH BIOPSY N/A 07/17/2018   Procedure: CYSTOSCOPY WITH BLADDER BIOPSY;  Surgeon: Billey Co, MD;  Location: [facility] ORS;  Service: Urology;  Laterality: N/A;   CYSTOSCOPY WITH BIOPSY N/A 06/17/2019   Procedure: CYSTOSCOPY WITH BLADDER  BIOPSY;  Surgeon: Billey Co, MD;  Location: [facility] ORS;  Service: Urology;  Laterality: N/A;   CYSTOSCOPY WITH FULGERATION N/A 07/17/2018   Procedure: CYSTOSCOPY WITH FULGERATION;  Surgeon: Billey Co, MD;  Location: [facility] ORS;  Service: Urology;  Laterality: N/A;   CYSTOSCOPY WITH FULGERATION N/A 06/17/2019   Procedure: CYSTOSCOPY WITH  FULGERATION;  Surgeon: Billey Co, MD;  Location: [facility] ORS;  Service: Urology;  Laterality: N/A;   ESOPHAGOGASTRODUODENOSCOPY     HERNIA REPAIR  0626   umbilical   STOMACH SURGERY     FUNDIC GLAND POLYP    Social History   Tobacco Use   Smoking status: Never   Smokeless tobacco: Never  Vaping Use   Vaping Use: Never used  Substance Use Topics   Alcohol use: Never   Drug use: Never    Family History  Problem Relation Age of Onset   Hypertension Mother    Heart disease Father    GER disease Sister    GER disease Brother    Prostate cancer Neg Hx    Kidney cancer Neg Hx     PE: Vitals:   04/14/22 0154 04/14/22 0332 04/14/22 0433 04/14/22 0731  BP: 123/72 1'09/73 99/71 93/68 '$  Pulse: 96 99 85 77  Resp: '14 18 14 '$ (!) 21  Temp: 98.5 F (36.9 C) 100 F (37.8 C)    TempSrc: Oral Oral  SpO2: 97% 96% 97%   Weight:      Height:       Patient appears to be in no acute distress  patient is alert and oriented x3 Atraumatic normocephalic head No cervical or supraclavicular lymphadenopathy appreciated No increased work of breathing, no audible wheezes/rhonchi Regular sinus rhythm/rate Abdomen is soft, mildly tender to palpation left lower quadrant, ileal conduit right lower abdomen pink patent and productive with clear yellow urine with mucus in bag GU: Well-healed scrotal incision, mildly tender to palpation, no evidence of erythema or drainage, no recurrence of hydrocele Lower extremities are symmetric without appreciable edema Grossly neurologically intact No identifiable skin lesions  Recent Labs    04/14/22 0030 04/14/22 0619  WBC 12.1* 9.9  HGB 13.4 11.3*  HCT 40.2 33.1*   Recent Labs    04/14/22 0030 04/14/22 0619  NA 141 140  K 3.5 3.9  CL 110 111  CO2 23 22  GLUCOSE 97 113*  BUN 20 17  CREATININE 0.96 0.88  CALCIUM 9.1 8.2*   Recent Labs    04/14/22 0619  INR 1.5*   No results for input(s): "LABURIN" in the last 72 hours. Results for  orders placed or performed during the hospital encounter of 04/14/22  Blood culture (routine x 2)     Status: None (Preliminary result)   Collection Time: 04/14/22  4:11 AM   Specimen: BLOOD  Result Value Ref Range Status   Specimen Description BLOOD BLOOD RIGHT FOREARM  Final   Special Requests   Final    BOTTLES DRAWN AEROBIC AND ANAEROBIC Blood Culture results may not be optimal due to an excessive volume of blood received in culture bottles   Culture   Final    NO GROWTH < 12 HOURS Performed at [facility], [address], [place], [place] [address]    Report Status PENDING  Incomplete  Blood culture (routine x 2)     Status: None (Preliminary result)   Collection Time: 04/14/22  4:12 AM   Specimen: BLOOD  Result Value Ref Range Status   Specimen Description BLOOD BLOOD LEFT HAND  Final   Special Requests   Final    BOTTLES DRAWN AEROBIC AND ANAEROBIC Blood Culture results may not be optimal due to an excessive volume of blood received in culture bottles   Culture   Final    NO GROWTH < 12 HOURS Performed at [facility], [address], [place], [place] [address]    Report Status PENDING  Incomplete      Component 2 wk ago  Specimen Description URINE, RANDOM Performed at [facility], [address] [place], [place] [address]  Special Requests NONE Performed at [facility], [address] [place], [place] [address]  Culture 20,000 COLONIES/mL KLEBSIELLA PNEUMONIAE Abnormal   Report Status 03/31/2022 FINAL  Organism ID, Bacteria KLEBSIELLA PNEUMONIAE Abnormal   Resulting Agency [facility]     Susceptibility   Klebsiella pneumoniae    MIC    AMPICILLIN >=32 RESIST... Resistant    AMPICILLIN/SULBACTAM 4 SENSITIVE Sensitive    CEFAZOLIN <=4 SENSITIVE Sensitive    CEFEPIME <=0.12 SENS... Sensitive    CEFTRIAXONE <=0.25 SENS... Sensitive    CIPROFLOXACIN <=0.25 SENS... Sensitive    GENTAMICIN <=1 SENSITIVE Sensitive     IMIPENEM <=0.25 SENS... Sensitive    NITROFURANTOIN 64 INTERMED... Intermediate    PIP/TAZO <=4 SENSITIVE Sensitive    TRIMETH/SULFA <=20 SENSIT... Sensitive  Imaging: CT Abd/Pelvis  Narrative & Impression CLINICAL DATA:  Left lower quadrant abdominal pain   EXAM: CT ABDOMEN AND PELVIS WITH CONTRAST   TECHNIQUE: Multidetector CT imaging of the abdomen and pelvis was performed using the standard protocol following bolus administration of intravenous contrast.   RADIATION DOSE REDUCTION: This exam was performed according to the departmental dose-optimization program which includes automated exposure control, adjustment of the mA and/or kV according to patient size and/or use of iterative reconstruction technique.   CONTRAST:  175m OMNIPAQUE IOHEXOL 300 MG/ML  SOLN   COMPARISON:  03/29/2022   FINDINGS: Lower Chest: Basilar atelectasis   Hepatobiliary: Normal hepatic contours. No intra- or extrahepatic biliary dilatation. There is cholelithiasis without acute inflammation.   Pancreas: Normal pancreas. No ductal dilatation or peripancreatic fluid collection.   Spleen: Normal.   Adrenals/Urinary Tract: The adrenal glands are normal. 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 Unchanged bilateral hydroureter. There is worsened left pelviectasis, perinephric stranding and periureteral stranding.   Stomach/Bowel: Small duodenal diverticulum and intermediate sized hiatal hernia. No small bowel dilatation or inflammation. Mild residual inflammation at the site of recent sigmoid colon diverticulitis. No new site of colonic inflammation. Normal appendix.   Vascular/Lymphatic: There is calcific atherosclerosis of the abdominal aorta. No lymphadenopathy.   Reproductive: Normal prostate size with symmetric seminal vesicles.   Other: None.   Musculoskeletal: No bony spinal canal stenosis or focal osseous abnormality.    IMPRESSION: 1. 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 Increased left pelviectasis, perinephric stranding and periureteral stranding likely indicates ascending urinary tract infection. 2. Mild residual inflammation at the site of recent sigmoid colon diverticulitis.   Aortic atherosclerosis (ICD10-I70.0).     Electronically Signed   By: KUlyses JarredM.D.   On: 04/14/2022 02:48    Imp/Recommendations: Complicated UTI/pyelonephritis Left hydroureteronephrosis/pelviectasis  -Recommend treating patient with antibiotics based on urine culture sensitivities.  If patient needs to be discharged for his outpatient urology appointment Dr. EAmalia Haileyprior to urine culture sensitivities returning would recommend treating based on last prior culture and possibly choosing different antibiotic than keflex as he was just treated with that. -We also discussed the possibility of potential stricture at the ureteral anastomosis on the left side.  We discussed the nature of refluxing anastomoses and that the stable bilateral hydroureter appeared stable on right, possibly slightly worsened on left.   -As patient's pain is resolved and he does not appear toxic, VSS and labs improving, no recommendation for acute intervention -Would highly recommend discharging patient on 04/15/2022 as long as vital signs are stable and pain is resolved so that he may make his appointment [facility] Thank you for involving me in this patient's care. Please page with any further questions or concerns. Stefana Lodico D Asante Blanda

## 2022-04-14 NOTE — Progress Notes (Signed)
PHARMACIST - PHYSICIAN ORDER COMMUNICATION  CONCERNING: P&T Medication Policy on Herbal Medications  DESCRIPTION:  This patient's order(s) for: CoQ10 CAPS 1 capsule  has been noted.  This product(s) is classified as an "herbal" or natural product. Due to a lack of definitive safety studies or FDA approval, nonstandard manufacturing practices, plus the potential risk of unknown drug-drug interactions while on inpatient medications, the Pharmacy and Therapeutics Committee does not permit the use of "herbal" or natural products of this type within [facility].   ACTION TAKEN: The pharmacy department is unable to verify this order at this time.  Please reevaluate patient's clinical condition at discharge and address if the herbal or natural product(s) should be resumed at that time.  Renda Rolls, PharmD, [facility] 04/14/2022 4:12 AM

## 2022-04-14 NOTE — H&P (Signed)
[facility]   PATIENT NAME: Donald Barry    MR#:  [id number]  DATE OF BIRTH:  08/19/1945  DATE OF ADMISSION:  04/14/2022  PRIMARY CARE PHYSICIAN: Derinda Late, MD   Patient is coming from: Home  REQUESTING/REFERRING PHYSICIAN: Ward, Delice Bison, DO  CHIEF COMPLAINT:   Chief Complaint  Patient presents with   Abdominal Pain    HISTORY OF PRESENT ILLNESS:  Donald Barry is a 76 y.o. Caucasian male with medical history significant for atrial fibrillation, GERD, OSA on CPAP, and osteoarthritis, who presented to the emergency room with a [place] of left lower quadrant abdominal pain that started yesterday with associated nausea and dry heaves.  He admits to persistent cloudy urine in his Foley.  No hematuria in his urostomy bag.  No fever or chills at home however temperature was up to 100 in the ER.  He denies any flank pain.  No chest pain or palpitations but no cough or wheezing or dyspnea.  No melena or bright red being per rectum.  No other bleeding diathesis.  ED Course: Upon presentation to the emergency room, heart rate was 101 with otherwise normal vital signs.  Later temperature was 100.  CMP revealed a potassium of 3.5 with otherwise unremarkable levels.  CBC showed leukocytosis 12.1.  UA was strongly positive for UTI.  Urine culture was sent.  Imaging: Abdominal pelvic CT scan revealed the following cardiac 1. 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 Increased left pelviectasis, perinephric stranding and periureteral stranding likely indicates ascending urinary tract infection. 2. Mild residual inflammation at the site of recent sigmoid colon diverticulitis. 3. Aortic atherosclerosis.  The patient was given 50 mcg of IV fentanyl, 4 mg of IV Zofran, 1 g of p.o. Tylenol, IV Zosyn and hydration with IV normal saline at 125 mill per hour.  He will be admitted to a medical telemetry bed for further evaluation and  management.  PAST MEDICAL HISTORY:   Past Medical History:  Diagnosis Date   A-fib [facility])    Actinic keratosis    Hx of PDT Tx., face 1/252021 and scalp 07/05/2019   Allergic rhinitis, seasonal 06/14/2014   Allergy    Arthritis    Chronic neck pain 02/40/9735   Complication of anesthesia    Gastroesophageal reflux disease without esophagitis 06/14/2014   Headache    h/o migraines   Hepatic steatosis 04/05/2018   Hiatal hernia 09/09/2017   Intermittent left lower quadrant abdominal pain 07/15/2017   PONV (postoperative nausea and vomiting)    Sleep apnea    cpap   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 SCCis   Squamous cell skin cancer 07/2017   Resected from scalp.    PAST SURGICAL HISTORY:   Past Surgical History:  Procedure Laterality Date   BACK SURGERY  2008   L4, L5   COLONOSCOPY     cystectomy with prostatectomy ileal conduit     CYSTO WITH HYDRODISTENSION N/A 06/17/2019   Procedure: CYSTOSCOPY/HYDRODISTENSION;  Surgeon: Billey Co, MD;  Location: [facility] ORS;  Service: Urology;  Laterality: N/A;   CYSTOSCOPY WITH BIOPSY N/A 07/17/2018   Procedure: CYSTOSCOPY WITH BLADDER BIOPSY;  Surgeon: Billey Co, MD;  Location: [facility] ORS;  Service: Urology;  Laterality: N/A;   CYSTOSCOPY WITH BIOPSY N/A 06/17/2019   Procedure: CYSTOSCOPY WITH BLADDER  BIOPSY;  Surgeon:  Billey Co, MD;  Location: [facility] ORS;  Service: Urology;  Laterality: N/A;   CYSTOSCOPY WITH FULGERATION N/A 07/17/2018   Procedure: CYSTOSCOPY WITH FULGERATION;  Surgeon: Billey Co, MD;  Location: [facility] ORS;  Service: Urology;  Laterality: N/A;   CYSTOSCOPY WITH FULGERATION N/A 06/17/2019   Procedure: CYSTOSCOPY WITH FULGERATION;  Surgeon: Billey Co, MD;  Location: [facility] ORS;  Service: Urology;  Laterality: N/A;   ESOPHAGOGASTRODUODENOSCOPY     HERNIA REPAIR  [id number]   umbilical    STOMACH SURGERY     FUNDIC GLAND POLYP    SOCIAL HISTORY:   Social History   Tobacco Use   Smoking status: Never   Smokeless tobacco: Never  Substance Use Topics   Alcohol use: Never    FAMILY HISTORY:   Family History  Problem Relation Age of Onset   Hypertension Mother    Heart disease Father    GER disease Sister    GER disease Brother    Prostate cancer Neg Hx    Kidney cancer Neg Hx     DRUG ALLERGIES:   Allergies  Allergen Reactions   Tape Dermatitis    Skin blistered- steri strip  Skin blistered   Wound Dressing Adhesive Dermatitis    Skin blistered- steri strip   Shellfish Allergy Diarrhea and Nausea And Vomiting    Shrimp    Shellfish-Derived Products Diarrhea, Nausea And Vomiting and Nausea Only   Onion Other (See Comments)    diarrhea Diarrhea- verified avoids all onion ingredient (including onion powder). MM, RD 12/19/20   Other Nausea And Vomiting    general anesthesia   Codeine Nausea Only    REVIEW OF SYSTEMS:   ROS As per history of present illness. All pertinent systems were reviewed above. Constitutional, HEENT, cardiovascular, respiratory, GI, GU, musculoskeletal, neuro, psychiatric, endocrine, integumentary and hematologic systems were reviewed and are otherwise negative/unremarkable except for positive findings mentioned above in the HPI.   MEDICATIONS AT HOME:   Prior to Admission medications   Medication Sig Start Date End Date Taking? Authorizing Provider  apixaban (ELIQUIS) 5 MG TABS tablet Take 1 tablet (5 mg total) by mouth 2 (two) times daily. 02/07/22  Yes Dunn, Areta Haber, PA-C  atorvastatin (LIPITOR) 10 MG tablet Take 1 tablet by mouth daily. 08/06/19  Yes [provider]  Coenzyme Q10 (COQ10) 100 MG CAPS Take 1 capsule by mouth daily.   Yes [provider]  ferrous sulfate 325 (65 FE) MG EC tablet TAKE 1 TABLET BY MOUTH EVERY DAY 12/05/21  Yes Sindy Guadeloupe, MD  fluticasone (FLONASE) 50 MCG/ACT nasal spray Place  1 spray into both nostrils daily.   Yes [provider]  guaiFENesin (MUCINEX) 600 MG 12 hr tablet Take by mouth 2 (two) times daily.   Yes [provider]  loratadine (CLARITIN) 10 MG tablet Take 1 tablet by mouth daily.   Yes [provider]  metoprolol tartrate (LOPRESSOR) 25 MG tablet Take 0.5 tablets (12.5 mg total) by mouth 2 (two) times daily. 06/13/21  Yes Delman Kitten, MD  Multiple Vitamin (MULTIVITAMIN WITH MINERALS) TABS tablet Take 1 tablet by mouth daily. Iron Free   Yes [provider]  omeprazole (PRILOSEC) 40 MG capsule Take 1 capsule by mouth daily. 10/25/19  Yes [provider]  HYDROcodone-acetaminophen (NORCO/VICODIN) 5-325 MG tablet Take 1 tablet by mouth every 6 (six) hours as needed. 03/15/22   [provider]  NON FORMULARY Place 1 Dose/kg into the nose daily as  needed. Netty pot for sinuses    [provider]  traZODone (DESYREL) 50 MG tablet Take 50 mg by mouth at bedtime as needed. 12/20/21   [provider]      VITAL SIGNS:  Blood pressure 109/73, pulse 99, temperature 100 F (37.8 C), temperature source Oral, resp. rate 18, height 6' (1.829 m), weight 82.6 kg, SpO2 96 %.  PHYSICAL EXAMINATION:  Physical Exam  GENERAL:  76 y.o.-year-old Caucasian male patient lying in the bed with no acute distress.  EYES: Pupils equal, round, reactive to light and accommodation. No scleral icterus. Extraocular muscles intact.  HEENT: Head atraumatic, normocephalic. Oropharynx and nasopharynx clear.  NECK:  Supple, no jugular venous distention. No thyroid enlargement, no tenderness.  LUNGS: Normal breath sounds bilaterally, no wheezing, rales,rhonchi or crepitation. No use of accessory muscles of respiration.  CARDIOVASCULAR: Regular rate and rhythm, S1, S2 normal. No murmurs, rubs, or gallops.  ABDOMEN: Soft, nondistended, nontender. Bowel sounds present. No organomegaly or mass.  Right lower quadrant urostomy in  place with mild left lower quadrant tenderness without rebound tenderness guarding or rigidity. EXTREMITIES: No pedal edema, cyanosis, or clubbing.  NEUROLOGIC: Cranial nerves II through XII are intact. Muscle strength 5/5 in all extremities. Sensation intact. Gait not checked.  PSYCHIATRIC: The patient is alert and oriented x 3.  Normal affect and good eye contact. SKIN: No obvious rash, lesion, or ulcer.   LABORATORY PANEL:   CBC Recent Labs  Lab 04/14/22 0030  WBC 12.1*  HGB 13.4  HCT 40.2  PLT 196   ------------------------------------------------------------------------------------------------------------------  Chemistries  Recent Labs  Lab 04/14/22 0030  NA 141  K 3.5  CL 110  CO2 23  GLUCOSE 97  BUN 20  CREATININE 0.96  CALCIUM 9.1  AST 23  ALT 15  ALKPHOS 27*  BILITOT 0.7   ------------------------------------------------------------------------------------------------------------------  Cardiac Enzymes No results for input(s): "TROPONINI" in the last 168 hours. ------------------------------------------------------------------------------------------------------------------  RADIOLOGY:  CT ABDOMEN PELVIS W CONTRAST  Result Date: 04/14/2022 CLINICAL DATA:  Left lower quadrant abdominal pain EXAM: CT ABDOMEN AND PELVIS WITH CONTRAST TECHNIQUE: Multidetector CT imaging of the abdomen and pelvis was performed using the standard protocol following bolus administration of intravenous contrast. RADIATION DOSE REDUCTION: This exam was performed according to the departmental dose-optimization program which includes automated exposure control, adjustment of the mA and/or kV according to patient size and/or use of iterative reconstruction technique. CONTRAST:  161m OMNIPAQUE IOHEXOL 300 MG/ML  SOLN COMPARISON:  03/29/2022 FINDINGS: Lower Chest: Basilar atelectasis Hepatobiliary: Normal hepatic contours. No intra- or extrahepatic biliary dilatation. There is cholelithiasis  without acute inflammation. Pancreas: Normal pancreas. No ductal dilatation or peripancreatic fluid collection. Spleen: Normal. Adrenals/Urinary Tract: The adrenal glands are normal. 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 Unchanged bilateral hydroureter. There is worsened left pelviectasis, perinephric stranding and periureteral stranding. Stomach/Bowel: Small duodenal diverticulum and intermediate sized hiatal hernia. No small bowel dilatation or inflammation. Mild residual inflammation at the site of recent sigmoid colon diverticulitis. No new site of colonic inflammation. Normal appendix. Vascular/Lymphatic: There is calcific atherosclerosis of the abdominal aorta. No lymphadenopathy. Reproductive: Normal prostate size with symmetric seminal vesicles. Other: None. Musculoskeletal: No bony spinal canal stenosis or focal osseous abnormality. IMPRESSION: 1. 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 Increased left pelviectasis, perinephric stranding and periureteral stranding likely indicates ascending urinary tract infection. 2. Mild residual inflammation at the site of recent sigmoid colon diverticulitis. Aortic atherosclerosis (ICD10-I70.0). Electronically Signed  By: Ulyses Jarred M.D.   On: 04/14/2022 02:48      IMPRESSION AND PLAN:  Assessment and Plan: * Sepsis due to gram-negative UTI [facility]) - The patient will be admitted to a medical telemetry bed. - We will continue antibiotic therapy with IV Rocephin as it showed sensitivity to previously grown Klebsiella pneumoniae on 03/29/2022. - We will continue hydration with IV normal saline. - We will follow blood and urine cultures. - We will obtain a urology consult given recurrence of UTI with pyelonephritis and sepsis in less than a month. - I notified Dr. Claudia Desanctis about the patient.  Acute pyelonephritis - Management as above.  Paroxysmal atrial fibrillation (HCC) - We will  continue Eliquis and Lopressor.  Dyslipidemia - We will continue statin therapy.  OSA on CPAP We will resume CPAP nightly.   DVT prophylaxis: Eliquis. Advanced Care Planning:  Code Status: full code.  Family Communication:  The plan of care was discussed in details with the patient (and family). I answered all questions. The patient agreed to proceed with the above mentioned plan. Further management will depend upon hospital course. Disposition Plan: Back to previous home environment Consults called: Urology. All the records are reviewed and case discussed with ED provider.  Status is: Inpatient  At the time of the admission, it appears that the appropriate admission status for this patient is inpatient.  This is judged to be reasonable and necessary in order to provide the required intensity of service to ensure the patient's safety given the presenting symptoms, physical exam findings and initial radiographic and laboratory data in the context of comorbid conditions.  The patient requires inpatient status due to high intensity of service, high risk of further deterioration and high frequency of surveillance required.  I certify that at the time of admission, it is my clinical judgment that the patient will require inpatient hospital care extending more than 2 midnights.                            Dispo: The patient is from: Home              Anticipated d/c is to: Home              Patient currently is not medically stable to d/c.              Difficult to place patient: No  Christel Mormon M.D on 04/14/2022 at 4:23 AM  [facility]   From 7 PM-7 AM, contact night-coverage [email or web address]  CC: Primary care physician; Derinda Late, MD

## 2022-04-14 NOTE — ED Notes (Signed)
1st set of BC collected from IV start on right forearm, 2nd set of BC collect from venipuncture in left hand

## 2022-04-14 NOTE — ED Notes (Signed)
Report given to leala, RN

## 2022-04-14 NOTE — Assessment & Plan Note (Signed)
-   We will continue Eliquis and Lopressor. 

## 2022-04-14 NOTE — Assessment & Plan Note (Signed)
Management as above °

## 2022-04-14 NOTE — ED Provider Notes (Addendum)
[facility] Provider Note    Event Date/Time   First MD Initiated Contact with Patient 04/14/22 0104     (approximate)   History   Abdominal Pain   HPI  Donald Barry is a 76 y.o. male with history of A-fib, recent admission for perforated sigmoid diverticulitis, epididymoorchitis, scrotal cellulitis and pyelonephritis who presents to the emergency department with left lower quadrant pain that started yesterday and progressively worsened.  Has had nausea but no vomiting.  No diarrhea, bloody stools or melena.  No fever.  No previous abdominal surgery.  States he is no longer having any pain or swelling in the scrotum.  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   History provided by patient and wife.    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 SCCis   Squamous cell skin cancer 07/2017   Resected from scalp.    Past Surgical History:  Procedure Laterality Date   BACK SURGERY  2008   L4, L5   COLONOSCOPY     cystectomy with prostatectomy ileal conduit     CYSTO WITH HYDRODISTENSION N/A 06/17/2019   Procedure: CYSTOSCOPY/HYDRODISTENSION;  Surgeon: Billey Co, MD;  Location: [facility] ORS;  Service: Urology;  Laterality: N/A;   CYSTOSCOPY WITH BIOPSY N/A 07/17/2018    Procedure: CYSTOSCOPY WITH BLADDER BIOPSY;  Surgeon: Billey Co, MD;  Location: [facility] OR[facility];  Service: Urology;  Laterality: N/A;   CYSTOSCOPY WITH BIOPSY N/A 06/17/2019   Procedure: CYSTOSCOPY WITH BLADDER  BIOPSY;  Surgeon: Billey Co, MD;  Location: [facility];  Service: Urology;  Laterality: N/A;   CYSTOSCOPY WITH FULGERATION N/A 07/17/2018   Procedure: CYSTOSCOPY WITH FULGERATION;  Surgeon: Billey Co, MD;  Location: [facility];  Service: Urology;  Laterality: N/A;   CYSTOSCOPY WITH FULGERATION N/A 06/17/2019   Procedure: CYSTOSCOPY WITH FULGERATION;  Surgeon: Billey Co, MD;  Location: [facility];  Service: Urology;  Laterality: N/A;   ESOPHAGOGASTRODUODENOSCOPY     HERNIA REPAIR  2542   umbilical   STOMACH SURGERY     FUNDIC GLAND POLYP    MEDICATIONS:  Prior to Admission medications   Medication Sig Start Date End Date Taking? Authorizing Provider  apixaban (ELIQUIS) 5 MG TABS tablet Take 1 tablet (5 mg total) by mouth 2 (two) times daily. 02/07/22   Dunn, Areta Haber, PA-C  atorvastatin (LIPITOR) 10 MG tablet Take 1 tablet by mouth daily. 08/06/19   [provider]  Coenzyme Q10 (COQ10) 100 MG CAPS Take 1 capsule by mouth daily.    [provider]  ferrous sulfate 325 (65 FE) MG EC tablet TAKE 1 TABLET BY MOUTH EVERY DAY 12/05/21   Sindy Guadeloupe, MD  fluticasone (FLONASE) 50 MCG/ACT  nasal spray Place 1 spray into both nostrils daily.    [provider]  guaiFENesin (MUCINEX) 600 MG 12 hr tablet Take by mouth 2 (two) times daily.    [provider]  HYDROcodone-acetaminophen (NORCO/VICODIN) 5-325 MG tablet Take 1 tablet by mouth every 6 (six) hours as needed. 03/15/22   [provider]  loratadine (CLARITIN) 10 MG tablet Take 1 tablet by mouth daily.    [provider]  metoprolol tartrate (LOPRESSOR) 25 MG tablet Take 0.5 tablets (12.5 mg total) by mouth 2 (two) times daily. 06/13/21   Delman Kitten, MD  Multiple Vitamin  (MULTIVITAMIN WITH MINERALS) TABS tablet Take 1 tablet by mouth daily. Iron Free    [provider]  NON FORMULARY Place 1 Dose/kg into the nose daily as needed. Netty pot for sinuses    [provider]  omeprazole (PRILOSEC) 40 MG capsule Take 1 capsule by mouth daily. 10/25/19   [provider]  traZODone (DESYREL) 50 MG tablet Take 50 mg by mouth at bedtime as needed. 12/20/21   [provider]    Physical Exam   Triage Vital Signs: ED Triage Vitals  Enc Vitals Group     BP 04/14/22 0019 119/75     Pulse Rate 04/14/22 0019 (!) 101     Resp 04/14/22 0019 18     Temp 04/14/22 0019 97.6 F (36.4 C)     Temp Source 04/14/22 0019 Oral     SpO2 04/14/22 0019 96 %     Weight 04/14/22 0018 182 lb (82.6 kg)     Height 04/14/22 0018 6' (1.829 m)     Head Circumference --      Peak Flow --      Pain Score 04/14/22 0018 8     Pain Loc --      Pain Edu? --      Excl. in [place]? --     Most recent vital signs: Vitals:   04/14/22 0154 04/14/22 0332  BP: 123/72 109/73  Pulse: 96 99  Resp: 14 18  Temp: 98.5 F (36.9 C) 100 F (37.8 C)  SpO2: 97% 96%    CONSTITUTIONAL: Alert and oriented and responds appropriately to questions. Well-appearing; well-nourished HEAD: Normocephalic, atraumatic EYES: Conjunctivae clear, pupils appear equal, sclera nonicteric ENT: normal nose; moist mucous membranes NECK: Supple, normal ROM CARD: RRR; S1 and S2 appreciated; no murmurs, no clicks, no rubs, no gallops RESP: Normal chest excursion without splinting or tachypnea; breath sounds clear and equal bilaterally; no wheezes, no rhonchi, no rales, no hypoxia or respiratory distress, speaking full sentences ABD/GI: Normal bowel sounds; non-distended; soft, tender palpation of the left lower quadrant without guarding or rebound; ostomy in the right lower quadrant BACK: The back appears normal EXT: Normal ROM in all joints; no deformity noted, no edema; no cyanosis SKIN:  Normal color for age and race; warm; no rash on exposed skin NEURO: Moves all extremities equally, normal speech PSYCH: The patient's mood and manner are appropriate.   ED Results / Procedures / Treatments   LABS: (all labs ordered are listed, but only abnormal results are displayed) Labs Reviewed  COMPREHENSIVE METABOLIC PANEL - Abnormal; Notable for the following components:      Result Value   Alkaline Phosphatase 27 (*)    All other components within normal limits  CBC - Abnormal; Notable for the following components:   WBC 12.1 (*)    All other components within normal limits  URINALYSIS, ROUTINE W  REFLEX MICROSCOPIC - Abnormal; Notable for the following components:   Color, Urine YELLOW (*)    APPearance TURBID (*)    Hgb urine dipstick SMALL (*)    Protein, ur 100 (*)    Nitrite POSITIVE (*)    Leukocytes,Ua LARGE (*)    RBC / HPF >50 (*)    WBC, UA >50 (*)    All other components within normal limits  URINE CULTURE  LIPASE, BLOOD     EKG:  RADIOLOGY: My personal review and interpretation of imaging: CT scan shows perinephric stranding, periureteral stranding concerning for an ascending UTI/pyelonephritis.  I have personally reviewed all radiology reports.   CT ABDOMEN PELVIS W CONTRAST  Result Date: 04/14/2022 CLINICAL DATA:  Left lower quadrant abdominal pain EXAM: CT ABDOMEN AND PELVIS WITH CONTRAST TECHNIQUE: Multidetector CT imaging of the abdomen and pelvis was performed using the standard protocol following bolus administration of intravenous contrast. RADIATION DOSE REDUCTION: This exam was performed according to the departmental dose-optimization program which includes automated exposure control, adjustment of the mA and/or kV according to patient size and/or use of iterative reconstruction technique. CONTRAST:  156m OMNIPAQUE IOHEXOL 300 MG/ML  SOLN COMPARISON:  03/29/2022 FINDINGS: Lower Chest: Basilar atelectasis Hepatobiliary: Normal hepatic contours. No  intra- or extrahepatic biliary dilatation. There is cholelithiasis without acute inflammation. Pancreas: Normal pancreas. No ductal dilatation or peripancreatic fluid collection. Spleen: Normal. Adrenals/Urinary Tract: The adrenal glands are normal. 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 Unchanged bilateral hydroureter. There is worsened left pelviectasis, perinephric stranding and periureteral stranding. Stomach/Bowel: Small duodenal diverticulum and intermediate sized hiatal hernia. No small bowel dilatation or inflammation. Mild residual inflammation at the site of recent sigmoid colon diverticulitis. No new site of colonic inflammation. Normal appendix. Vascular/Lymphatic: There is calcific atherosclerosis of the abdominal aorta. No lymphadenopathy. Reproductive: Normal prostate size with symmetric seminal vesicles. Other: None. Musculoskeletal: No bony spinal canal stenosis or focal osseous abnormality. IMPRESSION: 1. 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 Increased left pelviectasis, perinephric stranding and periureteral stranding likely indicates ascending urinary tract infection. 2. Mild residual inflammation at the site of recent sigmoid colon diverticulitis. Aortic atherosclerosis (ICD10-I70.0). Electronically Signed   By: KUlyses JarredM.D.   On: 04/14/2022 02:48     PROCEDURES:  Critical Care performed: No    Procedures    IMPRESSION / MDM / ASSESSMENT AND PLAN / ED COURSE  I reviewed the triage vital signs and the nursing notes.    Patient here with left lower quadrant abdominal pain.  The patient is on the cardiac monitor to evaluate for evidence of arrhythmia and/or significant heart rate changes.   DIFFERENTIAL DIAGNOSIS (includes but not limited to):   Diverticulitis, colitis, UTI, pyelonephritis, kidney stone   Patient's presentation is most consistent with acute presentation with potential threat  to life or bodily function.   PLAN: Obtain CBC, CMP, lipase, urinalysis, urine culture, CT of the abdomen pelvis.  Will give IV fluids, pain and nausea medicine.   MEDICATIONS GIVEN IN ED: Medications  0.9 %  sodium chloride infusion ( Intravenous New Bag/Given 04/14/22 0203)  piperacillin-tazobactam (ZOSYN) IVPB 3.375 g (3.375 g Intravenous New Bag/Given 04/14/22 0323)  fentaNYL (SUBLIMAZE) injection 50 mcg (50 mcg Intravenous Given 04/14/22 0204)  ondansetron (ZOFRAN) injection 4 mg (4 mg Intravenous Given 04/14/22 0204)  iohexol (OMNIPAQUE) 300 MG/ML solution 100 mL (100 mLs Intravenous Contrast Given 04/14/22 0225)     ED COURSE: Labs show leukocytosis  of 12.1.  Did have a heart rate of 101.  No fever here.  Normal blood pressures.  Normal renal function, LFTs, lipase.  Urine appears infected.  Culture pending.  CT scan reviewed and interpreted by myself and the radiologist and shows signs of an ascending UTI, pyelonephritis.  No diverticulitis, perforation or colitis today.  Will give IV Zosyn as his last culture grew Klebsiella.  Recommended admission and patient agrees.  3:36 AM  Pt now has oral temp of 100.  Will give Tylenol.  Will obtain blood cultures, lactic.  Hospitalist updated.  CONSULTS:  Consulted and discussed patient's case with hospitalist, Dr. Sidney Ace.  I have recommended admission and consulting physician agrees and will place admission orders.  Patient (and family if present) agree with this plan.   I reviewed all nursing notes, vitals, pertinent previous records.  All labs, EKGs, imaging ordered have been independently reviewed and interpreted by myself.    OUTSIDE RECORDS REVIEWED: Reviewed patient's recent admission in November 2023.       FINAL CLINICAL IMPRESSION(S) / ED DIAGNOSES   Final diagnoses:  Pyelonephritis     Rx / DC Orders   ED Discharge Orders     None        Note:  This document was prepared using Dragon voice recognition software  and may include unintentional dictation errors.   Marca Gadsby, Delice Bison, DO 04/14/22 [place], Delice Bison, DO 04/14/22 [phone]

## 2022-04-14 NOTE — ED Notes (Signed)
Pt states coming in for abdominal pain, which has now resolved. Pt noted to have urostomy with clear yellow urin in the bag. Pt alert and oriented.

## 2022-04-14 NOTE — Assessment & Plan Note (Signed)
We will resume CPAP nightly.

## 2022-04-15 DIAGNOSIS — N390 Urinary tract infection, site not specified: Secondary | ICD-10-CM | POA: Diagnosis not present

## 2022-04-15 DIAGNOSIS — I480 Paroxysmal atrial fibrillation: Secondary | ICD-10-CM | POA: Diagnosis not present

## 2022-04-15 DIAGNOSIS — N10 Acute tubulo-interstitial nephritis: Secondary | ICD-10-CM | POA: Diagnosis not present

## 2022-04-15 DIAGNOSIS — A4150 Gram-negative sepsis, unspecified: Secondary | ICD-10-CM | POA: Diagnosis not present

## 2022-04-15 MED ORDER — CIPROFLOXACIN HCL 500 MG PO TABS: 500.0000 mg | ORAL_TABLET | Freq: Two times a day (BID) | ORAL | 0 refills | Status: AC

## 2022-04-15 NOTE — ED Notes (Signed)
Donald Barry to be D/C'd Home per MD order.  Discussed prescriptions and follow up appointments with the patient. Prescriptions given to patient, medication list explained in detail. Pt verbalized understanding.  Allergies as of 04/15/2022       Reactions   Tape Dermatitis   Skin blistered- steri strip Skin blistered   Wound Dressing Adhesive Dermatitis   Skin blistered- steri strip   Shellfish Allergy Diarrhea, Nausea And Vomiting   Shrimp   Shellfish-derived Products Diarrhea, Nausea And Vomiting, Nausea Only   Onion Other (See Comments)   diarrhea Diarrhea- verified avoids all onion ingredient (including onion powder). MM, RD 12/19/20   Other Nausea And Vomiting   general anesthesia   Codeine Nausea Only        Medication List     TAKE these medications    apixaban 5 MG Tabs tablet Commonly known as: ELIQUIS Take 1 tablet (5 mg total) by mouth 2 (two) times daily.   atorvastatin 10 MG tablet Commonly known as: LIPITOR Take 1 tablet by mouth daily.   ciprofloxacin 500 MG tablet Commonly known as: Cipro Take 1 tablet (500 mg total) by mouth 2 (two) times daily for 10 days.   CoQ10 100 MG Caps Take 1 capsule by mouth daily.   ferrous sulfate 325 (65 FE) MG EC tablet TAKE 1 TABLET BY MOUTH EVERY DAY   fluticasone 50 MCG/ACT nasal spray Commonly known as: FLONASE Place 1 spray into both nostrils daily.   guaiFENesin 600 MG 12 hr tablet Commonly known as: MUCINEX Take by mouth 2 (two) times daily.   HYDROcodone-acetaminophen 5-325 MG tablet Commonly known as: NORCO/VICODIN Take 1 tablet by mouth every 6 (six) hours as needed.   loratadine 10 MG tablet Commonly known as: CLARITIN Take 1 tablet by mouth daily.   metoprolol tartrate 25 MG tablet Commonly known as: LOPRESSOR Take 0.5 tablets (12.5 mg total) by mouth 2 (two) times daily.   multivitamin with minerals Tabs tablet Take 1 tablet by mouth daily. Iron Free   NON FORMULARY Place 1 Dose/kg into  the nose daily as needed. Netty pot for sinuses   omeprazole 40 MG capsule Commonly known as: PRILOSEC Take 1 capsule by mouth daily.   traZODone 50 MG tablet Commonly known as: DESYREL Take 50 mg by mouth at bedtime as needed.        Vitals:   04/15/22 0800 04/15/22 1100  BP: (!) 141/77 116/69  Pulse: (!) 52 65  Resp: 20 13  Temp:  98 F (36.7 C)  SpO2: 93% 95%    IV catheter discontinued intact. Site without signs and symptoms of complications. Dressing and pressure applied. Pt denies pain at this time. No complaints noted.  An After Visit Summary was printed and given to the patient. Patient escorted via [place], and D/C home via private auto. Wife at bedside to transport pt home.  [name]

## 2022-04-15 NOTE — Care Management Obs Status (Signed)
[facility] NOTIFICATION   Patient Details  Name: Donald Barry MRN: [id number] Date of Birth: 1945/10/24   Medicare Observation Status Notification Given:  Yes    Shelbie Hutching, RN 04/15/2022, 12:45 PM

## 2022-04-15 NOTE — Care Management CC44 (Signed)
Condition Code 44 Documentation Completed  Patient Details  Name: Donald Barry MRN: [id number] Date of Birth: 1945-11-16   Condition Code 44 given:  Yes Patient signature on Condition Code 44 notice:  Yes Documentation of 2 MD's agreement:  Yes Code 44 added to claim:  Yes    Shelbie Hutching, RN 04/15/2022, 12:46 PM

## 2022-04-15 NOTE — Discharge Summary (Signed)
Physician Discharge Summary  Donald Barry MRN:[id number] DOB: 12/14/1945 DOA: 04/14/2022  PCP: Babaoff, Marcus, MD  Admit date: 04/14/2022 Discharge date: 04/15/2022  Admitted From: home  Disposition:  home   Recommendations for Outpatient Follow-up:  Follow up with PCP in 1-2 weeks F/u w/ urology at [facility] tomorrow as previously scheduled  Your urologist at [facility] will need to follow-up on urine culture from [facility] taken 04/14/22 to confirm you were prescribed the correct abx as the culture was not finalized when you were d/c from [facility]  Home Health: no  Equipment/Devices:  Discharge Condition: stable  CODE STATUS: full  Diet recommendation: Heart Healthy  Brief/Interim Summary: HPI was taken from Dr. Mansy: Donald Barry is a 76 y.o. Caucasian male with medical history significant for atrial fibrillation, GERD, OSA on CPAP, and osteoarthritis, who presented to the emergency room with a Kalisetti of left lower quadrant abdominal pain that started yesterday with associated nausea and dry heaves.  He admits to persistent cloudy urine in his Foley.  No hematuria in his urostomy bag.  No fever or chills at home however temperature was up to 100 in the ER.  He denies any flank pain.  No chest pain or palpitations but no cough or wheezing or dyspnea.  No melena or bright red being per rectum.  No other bleeding diathesis.   ED Course: Upon presentation to the emergency room, heart rate was 101 with otherwise normal vital signs.  Later temperature was 100.  CMP revealed a potassium of 3.5 with otherwise unremarkable levels.  CBC showed leukocytosis 12.1.  UA was strongly positive for UTI.  Urine culture was sent.   Imaging: Abdominal pelvic CT scan revealed the following cardiac 1. 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 Increased left pelviectasis, perinephric stranding and periureteral stranding likely indicates ascending urinary  tract infection. 2. Mild residual inflammation at the site of recent sigmoid colon diverticulitis. 3. Aortic atherosclerosis.   The patient was given 50 mcg of IV fentanyl, 4 mg of IV Zofran, 1 g of p.o. Tylenol, IV Zosyn and hydration with IV normal saline at 125 mill per hour.  He will be admitted to a medical telemetry bed for further evaluation and management.  As per Dr. Williams 04/15/22: By the time I saw the pt, pt's vital signs were stable and pt was not c/o any pain. Urine cx was pending at time of d/c and pt was sent home on po cipro based off of previous urine cx results on 03/29/22. Pt has a previously scheduled urology appt at [facility] tomorrow and really wanted to go to his appointment & it was per urology recs as long as vitals were stable & pain was resolved. Pt understands that his urologist at [facility] will need to f/u urine cx results from 04/14/22 here at [facility] to ensure the correct abx was prescribed to him. Pt verbalized his understanding.    Discharge Diagnoses:  Principal Problem:   Sepsis due to gram-negative UTI (HCC) Active Problems:   Acute pyelonephritis   Paroxysmal atrial fibrillation (HCC)   OSA on CPAP   Dyslipidemia  Sepsis: due to gram-negative UTI. See Dr. Mansy's note to see how pt met sepsis criteria. Vital signs are WNL. Continue on IV rocephin while in the hospital and d/c home on po cipro as per urine cx results on 03/29/22. Urine cx is pending. Blood cxs NGTD. Continue on IVFs. No acute intervention as per urology.    Pt's urologist at [facility] will have to f/u on urine cx results on 04/14/22 here at [facility]. Pt verbalized his understanding.   Acute pyelonephritis: continue on IV rocephin. Urine cx is pending. D/c home on po cipro    PAF: continue on metoprolol, eliquis    HLD: continue on statin    OSA: CPAP qhs   Discharge Instructions  Discharge Instructions     Diet - low sodium heart healthy   Complete by: As directed    Discharge  instructions   Complete by: As directed    F/u w/ urology tomorrow at [facility] w/ previously scheduled appointment. Your urologist at [facility] will need to follow-up on urine culture from [facility] taken 04/14/22 to confirm you were prescribed the correct abx as the culture was not finalized when you were d/c from [facility]. F/u w/ PCP in 1-2 weeks   Increase activity slowly   Complete by: As directed       Allergies as of 04/15/2022       Reactions   Tape Dermatitis   Skin blistered- steri strip Skin blistered   Wound Dressing Adhesive Dermatitis   Skin blistered- steri strip   Shellfish Allergy Diarrhea, Nausea And Vomiting   Shrimp   Shellfish-derived Products Diarrhea, Nausea And Vomiting, Nausea Only   Onion Other (See Comments)   diarrhea Diarrhea- verified avoids all onion ingredient (including onion powder). MM, RD 12/19/20   Other Nausea And Vomiting   general anesthesia   Codeine Nausea Only        Medication List     TAKE these medications    apixaban 5 MG Tabs tablet Commonly known as: ELIQUIS Take 1 tablet (5 mg total) by mouth 2 (two) times daily.   atorvastatin 10 MG tablet Commonly known as: LIPITOR Take 1 tablet by mouth daily.   ciprofloxacin 500 MG tablet Commonly known as: Cipro Take 1 tablet (500 mg total) by mouth 2 (two) times daily for 10 days.   CoQ10 100 MG Caps Take 1 capsule by mouth daily.   ferrous sulfate 325 (65 FE) MG EC tablet TAKE 1 TABLET BY MOUTH EVERY DAY   fluticasone 50 MCG/ACT nasal spray Commonly known as: FLONASE Place 1 spray into both nostrils daily.   guaiFENesin 600 MG 12 hr tablet Commonly known as: MUCINEX Take by mouth 2 (two) times daily.   HYDROcodone-acetaminophen 5-325 MG tablet Commonly known as: NORCO/VICODIN Take 1 tablet by mouth every 6 (six) hours as needed.   loratadine 10 MG tablet Commonly known as: CLARITIN Take 1 tablet by mouth daily.   metoprolol tartrate 25 MG tablet Commonly known as:  LOPRESSOR Take 0.5 tablets (12.5 mg total) by mouth 2 (two) times daily.   multivitamin with minerals Tabs tablet Take 1 tablet by mouth daily. Iron Free   NON FORMULARY Place 1 Dose/kg into the nose daily as needed. Netty pot for sinuses   omeprazole 40 MG capsule Commonly known as: PRILOSEC Take 1 capsule by mouth daily.   traZODone 50 MG tablet Commonly known as: DESYREL Take 50 mg by mouth at bedtime as needed.        Allergies  Allergen Reactions   Tape Dermatitis    Skin blistered- steri strip  Skin blistered   Wound Dressing Adhesive Dermatitis    Skin blistered- steri strip   Shellfish Allergy Diarrhea and Nausea And Vomiting    Shrimp    Shellfish-Derived Products Diarrhea, Nausea And Vomiting and Nausea Only     Onion Other (See Comments)    diarrhea Diarrhea- verified avoids all onion ingredient (including onion powder). MM, RD 12/19/20   Other Nausea And Vomiting    general anesthesia   Codeine Nausea Only    Consultations: Urology    Procedures/Studies: CT ABDOMEN PELVIS W CONTRAST  Result Date: 04/14/2022 CLINICAL DATA:  Left lower quadrant abdominal pain EXAM: CT ABDOMEN AND PELVIS WITH CONTRAST TECHNIQUE: Multidetector CT imaging of the abdomen and pelvis was performed using the standard protocol following bolus administration of intravenous contrast. RADIATION DOSE REDUCTION: This exam was performed according to the departmental dose-optimization program which includes automated exposure control, adjustment of the mA and/or kV according to patient size and/or use of iterative reconstruction technique. CONTRAST:  100mL OMNIPAQUE IOHEXOL 300 MG/ML  SOLN COMPARISON:  03/29/2022 FINDINGS: Lower Chest: Basilar atelectasis Hepatobiliary: Normal hepatic contours. No intra- or extrahepatic biliary dilatation. There is cholelithiasis without acute inflammation. Pancreas: Normal pancreas. No ductal dilatation or peripancreatic fluid collection. Spleen: Normal.  Adrenals/Urinary Tract: The adrenal glands are normal. 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 Unchanged bilateral hydroureter. There is worsened left pelviectasis, perinephric stranding and periureteral stranding. Stomach/Bowel: Small duodenal diverticulum and intermediate sized hiatal hernia. No small bowel dilatation or inflammation. Mild residual inflammation at the site of recent sigmoid colon diverticulitis. No new site of colonic inflammation. Normal appendix. Vascular/Lymphatic: There is calcific atherosclerosis of the abdominal aorta. No lymphadenopathy. Reproductive: Normal prostate size with symmetric seminal vesicles. Other: None. Musculoskeletal: No bony spinal canal stenosis or focal osseous abnormality. IMPRESSION: 1. 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 Increased left pelviectasis, perinephric stranding and periureteral stranding likely indicates ascending urinary tract infection. 2. Mild residual inflammation at the site of recent sigmoid colon diverticulitis. Aortic atherosclerosis (ICD10-I70.0). Electronically Signed   By: Kevin  Herman M.D.   On: 04/14/2022 02:48   CT ABDOMEN PELVIS W CONTRAST  Result Date: 03/29/2022 CLINICAL DATA:  Left lower quadrant abdominal pain, chills EXAM: CT ABDOMEN AND PELVIS WITH CONTRAST TECHNIQUE: Multidetector CT imaging of the abdomen and pelvis was performed using the standard protocol following bolus administration of intravenous contrast. RADIATION DOSE REDUCTION: This exam was performed according to the departmental dose-optimization program which includes automated exposure control, adjustment of the mA and/or kV according to patient size and/or use of iterative reconstruction technique. CONTRAST:  100mL OMNIPAQUE IOHEXOL 300 MG/ML  SOLN COMPARISON:  08/10/2021 FINDINGS: Lower chest: Bibasilar atelectasis. Cardiac size within normal limits. Large hiatal hernia. Hepatobiliary:  Cholelithiasis without pericholecystic inflammatory change. Tiny probable cyst within the right hepatic dome. Liver otherwise unremarkable. No intra or extrahepatic biliary ductal dilation. Pancreas: Unremarkable Spleen: Unremarkable Adrenals/Urinary Tract: The adrenal glands are unremarkable. Surgical changes of cystectomy and ileal conduit formation are identified with a right mid abdominal urostomy identified. Small fat containing parastomal hernia. The kidneys are normal in size and position. There is mild right and moderate left hydronephrosis with delayed left renal cortical enhancement noted related to changes of obstructive uropathy. There is, additionally, superimposed mild left periureteric inflammatory stranding and marked urothelial enhancement suggesting superimposed infection. Ureteral dilation bilaterally extends to the level of the ureteral anastomoses. Immediately adjacent to the distal left ureter are extensive inflammatory changes and punctate foci of extraluminal gas in keeping with changes of acute diverticulitis described below. No perinephric fluid collections are identified. No intrarenal or ureteral calculi are seen. The conduit itself is decompressed. Stomach/Bowel: Moderate sigmoid diverticulosis. There is focal circumferential colonic wall thickening and extensive   pericolonic inflammatory stranding with punctate foci of extraluminal gas adjacent to the mid sigmoid colon in keeping with changes of acute, perforated sigmoid diverticulitis. No evidence of obstruction. No gross free intraperitoneal gas. No free intraperitoneal fluid or loculated intra-abdominal fluid collections. The stomach, small bowel, and large bowel are otherwise unremarkable. Appendix normal. Vascular/Lymphatic: Mild aortoiliac atherosclerotic calcification. No aortic aneurysm. No pathologic adenopathy within the abdomen and pelvis. Reproductive: The prostate gland and seminal vesicles are unremarkable. There is scrotal  wall thickening noted. There is punctate gas within the left hemiscrotum, likely postsurgical in nature given the history of recent hydrocelectomy. Small left varicocele noted. Other: None Musculoskeletal: Osseous structures are age-appropriate. No acute bone abnormality. IMPRESSION: 1. Acute, perforated sigmoid diverticulitis. No evidence of obstruction, intra-abdominal abscess formation, or gross free intraperitoneal gas. 2. Surgical changes of cystectomy and ileal conduit formation with a right mid abdominal urostomy. Mild right and moderate left hydronephrosis with delayed left renal cortical enhancement related to changes of obstructive uropathy. Superimposed mild left periureteric inflammatory stranding and marked urothelial enhancement suggesting superimposed infection. Correlation with urinalysis and urine culture may be helpful. 3. Cholelithiasis. 4. Large hiatal hernia. 5. Scrotal wall thickening with punctate gas within the left hemiscrotum, likely postsurgical in nature given the history of recent hydrocelectomy. Small left varicocele noted. 6.  Aortic Atherosclerosis (ICD10-I70.0). Electronically Signed   By: Ashesh  Parikh M.D.   On: 03/29/2022 04:22   US SCROTUM W/DOPPLER  Result Date: 03/29/2022 CLINICAL DATA:  Left testicular pain, status post left hydrocelectomy EXAM: SCROTAL ULTRASOUND DOPPLER ULTRASOUND OF THE TESTICLES TECHNIQUE: Complete ultrasound examination of the testicles, epididymis, and other scrotal structures was performed. Color and spectral Doppler ultrasound were also utilized to evaluate blood flow to the testicles. COMPARISON:  None Available. FINDINGS: Right testicle Measurements: 3.6 x 2.4 x 2.4 cm. Normal echogenicity and echotexture. Normal color flow vascularity. No mass or microlithiasis visualized. Left testicle Measurements: 4.4 x 2.5 x 2.7 cm. There is diffuse mild parenchymal edema of the left testis. Color flow vascularity is normal. No mass or microlithiasis  visualized. Right epididymis:  Normal in size and appearance. Left epididymis: The left epididymis is markedly thickened and hypervascular, best appreciated on image # 50 and cine sequence # 2 suggesting changes of acute epididymitis. There is and echogenic linear shadowing structure within the left hemiscrotum adjacent to the epididymis demonstrating "dirty shadowing" which may represent a small amount of gas within the left hemiscrotum, less likely a postsurgical implant. Hydrocele:  Trace right hydrocele noted Varicocele:  None visualized. Pulsed Doppler interrogation of both testes demonstrates normal low resistance arterial and venous waveforms bilaterally. Other: There is marked scrotal wall thickening and hypervascularity noted, asymmetrically more severe involving the left hemiscrotum, possibly postsurgical in nature though a superimposed acute inflammatory process such as cellulitis could appear similarly. IMPRESSION: 1. Findings most consistent with acute left epididymo-orchitis. 2. Marked left scrotal wall thickening and hypervascularity, asymmetrically more severe involving the left hemiscrotum, possibly postsurgical in nature though a superimposed acute inflammatory process such as cellulitis could appear similarly. 3. Linear echogenic shadowing structure within the left hemiscrotum adjacent to the epididymis demonstrating ultrasonographic shadowing which may represent a small amount of gas within the left hemiscrotum, less likely a postsurgical implant. 4. No evidence of testicular infarction Electronically Signed   By: Ashesh  Parikh M.D.   On: 03/29/2022 03:59   (Echo, Carotid, EGD, Colonoscopy, ERCP)    Subjective: Pt denies any complaints    Discharge Exam: Vitals:   04/15/22 0800   04/15/22 1100  BP: (!) 141/77 116/69  Pulse: (!) 52 65  Resp: 20 13  Temp:    SpO2: 93% 95%   Vitals:   04/15/22 0400 04/15/22 0758 04/15/22 0800 04/15/22 1100  BP: 105/66  (!) 141/77 116/69  Pulse: 63   (!) 52 65  Resp: 18  20 13  Temp: 98.3 F (36.8 C) 98.1 F (36.7 C)    TempSrc: Oral Oral    SpO2: 99%  93% 95%  Weight:      Height:        General: Pt is alert, awake, not in acute distress Cardiovascular:  S1/S2 +, no rubs, no gallops Respiratory: CTA bilaterally, no wheezing, no rhonchi Abdominal: Soft, NT, ND, bowel sounds + Extremities: no edema, no cyanosis    The results of significant diagnostics from this hospitalization (including imaging, microbiology, ancillary and laboratory) are listed below for reference.     Microbiology: Recent Results (from the past 240 hour(s))  Urine Culture     Status: Abnormal (Preliminary result)   Collection Time: 04/14/22  1:13 AM   Specimen: Urine, Clean Catch  Result Value Ref Range Status   Specimen Description   Final    URINE, CLEAN CATCH Performed at [facility], [address] [place], [place] [address]    Special Requests   Final    NONE Performed at [facility], [address] [place], [place] [address]    Culture (A)  Final    40,000 COLONIES/mL KLEBSIELLA PNEUMONIAE 10,000 COLONIES/mL PSEUDOMONAS AERUGINOSA SUSCEPTIBILITIES TO FOLLOW Performed at [facility], [address] [place], [place] [address]    Report Status PENDING  Incomplete  Blood culture (routine x 2)     Status: None (Preliminary result)   Collection Time: 04/14/22  4:11 AM   Specimen: BLOOD  Result Value Ref Range Status   Specimen Description BLOOD BLOOD RIGHT FOREARM  Final   Special Requests   Final    BOTTLES DRAWN AEROBIC AND ANAEROBIC Blood Culture results may not be optimal due to an excessive volume of blood received in culture bottles   Culture   Final    NO GROWTH 1 DAY Performed at [facility], [address] [place], [place] [address]    Report Status PENDING  Incomplete  Blood culture (routine x 2)     Status: None (Preliminary result)   Collection Time: 04/14/22  4:12 AM   Specimen:  BLOOD  Result Value Ref Range Status   Specimen Description BLOOD BLOOD LEFT HAND  Final   Special Requests   Final    BOTTLES DRAWN AEROBIC AND ANAEROBIC Blood Culture results may not be optimal due to an excessive volume of blood received in culture bottles   Culture   Final    NO GROWTH 1 DAY Performed at [facility], [address] [place], [place] [address]    Report Status PENDING  Incomplete     Labs: BNP (last 3 results) Recent Labs    02/05/22 2217  BNP 339.7*   Basic Metabolic Panel: Recent Labs  Lab 04/14/22 0030 04/14/22 0619  NA 141 140  K 3.5 3.9  CL 110 111  CO2 23 22  GLUCOSE 97 113*  BUN 20 17  CREATININE 0.96 0.88  CALCIUM 9.1 8.2*   Liver Function Tests: Recent Labs  Lab 04/14/22 0030  AST 23  ALT 15  ALKPHOS 27*  BILITOT 0.7  PROT 6.8  ALBUMIN 3.6     Recent Labs  Lab 04/14/22 0030  LIPASE 46   No results for input(s): "AMMONIA" in the last 168 hours. CBC: Recent Labs  Lab 04/14/22 0030 04/14/22 0619  WBC 12.1* 9.9  HGB 13.4 11.3*  HCT 40.2 33.1*  MCV 90.3 89.5  PLT 196 148*   Cardiac Enzymes: No results for input(s): "CKTOTAL", "CKMB", "CKMBINDEX", "TROPONINI" in the last 168 hours. BNP: Invalid input(s): "POCBNP" CBG: No results for input(s): "GLUCAP" in the last 168 hours. D-Dimer No results for input(s): "DDIMER" in the last 72 hours. Hgb A1c No results for input(s): "HGBA1C" in the last 72 hours. Lipid Profile No results for input(s): "CHOL", "HDL", "LDLCALC", "TRIG", "CHOLHDL", "LDLDIRECT" in the last 72 hours. Thyroid function studies No results for input(s): "TSH", "T4TOTAL", "T3FREE", "THYROIDAB" in the last 72 hours.  Invalid input(s): "FREET3" Anemia work up No results for input(s): "VITAMINB12", "FOLATE", "FERRITIN", "TIBC", "IRON", "RETICCTPCT" in the last 72 hours. Urinalysis    Component Value Date/Time   COLORURINE YELLOW (A) 04/14/2022 0113   APPEARANCEUR TURBID (A) 04/14/2022 0113    APPEARANCEUR Hazy (A) 06/09/2019 1324   LABSPEC 1.012 04/14/2022 0113   PHURINE 7.0 04/14/2022 0113   GLUCOSEU NEGATIVE 04/14/2022 0113   HGBUR SMALL (A) 04/14/2022 0113   BILIRUBINUR NEGATIVE 04/14/2022 0113   BILIRUBINUR Negative 06/09/2019 1324   KETONESUR NEGATIVE 04/14/2022 0113   PROTEINUR 100 (A) 04/14/2022 0113   NITRITE POSITIVE (A) 04/14/2022 0113   LEUKOCYTESUR LARGE (A) 04/14/2022 0113   Sepsis Labs Recent Labs  Lab 04/14/22 0030 04/14/22 0619  WBC 12.1* 9.9   Microbiology Recent Results (from the past 240 hour(s))  Urine Culture     Status: Abnormal (Preliminary result)   Collection Time: 04/14/22  1:13 AM   Specimen: Urine, Clean Catch  Result Value Ref Range Status   Specimen Description   Final    URINE, CLEAN CATCH Performed at [facility], [address] [place], [place] [address]    Special Requests   Final    NONE Performed at [facility], [address] [place], [place] [address]    Culture (A)  Final    40,000 COLONIES/mL KLEBSIELLA PNEUMONIAE 10,000 COLONIES/mL PSEUDOMONAS AERUGINOSA SUSCEPTIBILITIES TO FOLLOW Performed at [facility], [address] [place], [place] [address]    Report Status PENDING  Incomplete  Blood culture (routine x 2)     Status: None (Preliminary result)   Collection Time: 04/14/22  4:11 AM   Specimen: BLOOD  Result Value Ref Range Status   Specimen Description BLOOD BLOOD RIGHT FOREARM  Final   Special Requests   Final    BOTTLES DRAWN AEROBIC AND ANAEROBIC Blood Culture results may not be optimal due to an excessive volume of blood received in culture bottles   Culture   Final    NO GROWTH 1 DAY Performed at [facility], [address] [place], [place] [address]    Report Status PENDING  Incomplete  Blood culture (routine x 2)     Status: None (Preliminary result)   Collection Time: 04/14/22  4:12 AM   Specimen: BLOOD  Result Value Ref Range Status   Specimen  Description BLOOD BLOOD LEFT HAND  Final   Special Requests   Final    BOTTLES DRAWN AEROBIC AND ANAEROBIC Blood Culture results may not be optimal due to an excessive volume of blood received in culture bottles   Culture   Final    NO GROWTH 1 DAY Performed at   [facility], [address] [place], [place] [address]    Report Status PENDING  Incomplete     Time coordinating discharge: Over 30 minutes  SIGNED:   Jamiese M Williams, MD  [facility] 04/15/2022, 12:01 PM Pager   If 7PM-7AM, please contact night-coverage [email or web address]   

## 2022-04-15 NOTE — TOC Initial Note (Signed)
Transition of Care [facility]) - Initial/Assessment Note    Patient Details  Name: Donald Barry MRN: [id number] Date of Birth: 25-Jan-1946  Transition of Care [facility]) CM/SW Contact:    Shelbie Hutching, RN Phone Number: 04/15/2022, 12:53 PM  Clinical Narrative:                  Transition of Care [facility]) Screening Note   Patient Details  Name: Donald Barry Date of Birth: May 12, 1946   Transition of Care [facility]) CM/SW Contact:    Shelbie Hutching, RN Phone Number: 04/15/2022, 12:53 PM    Transition of Care Department [facility]) has reviewed patient and no TOC needs have been identified at this time. We will continue to monitor patient advancement through interdisciplinary progression rounds. If new patient transition needs arise, please place a TOC consult.          Patient Goals and CMS Choice        Expected Discharge Plan and Services           Expected Discharge Date: 04/15/22                                    Prior Living Arrangements/Services                       Activities of Daily Living      Permission Sought/Granted                  Emotional Assessment              Admission diagnosis:  Sepsis due to gram-negative UTI ([place]) [A41.50, N39.0] Patient Active Problem List   Diagnosis Date Noted   Sepsis due to gram-negative UTI ([place]) 04/14/2022   Dyslipidemia 04/14/2022   Hypomagnesemia 03/30/2022   Paroxysmal atrial fibrillation ([place]) 03/29/2022   Chronic anticoagulation 03/29/2022   s/p hydrocelectomy 03/15/22 03/29/2022   Perforation of sigmoid colon due to diverticulitis 03/29/2022   Bilateral hydronephrosis 40/98/1191   Complicated UTI (urinary tract infection) 03/29/2022   Sepsis secondary to UTI ([place]) 03/29/2022   Diverticulitis of colon 03/29/2022   Swelling of left half of scrotum 12/31/2021   Iron deficiency anemia 08/28/2021   Diverticulosis of colon 08/14/2021   History of iron deficiency anemia 08/14/2021    MGUS (monoclonal gammopathy of unknown significance) 05/21/2021   OSA on CPAP 05/21/2021   CPAP use counseling 05/21/2021   Acute pyelonephritis 01/13/2021   Sepsis ([place]) 01/12/2021   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) 07/13/2018   Hepatic steatosis 04/05/2018   Hiatal hernia 09/09/2017   Hx of adenomatous colonic polyps 07/15/2017   Intermittent left lower quadrant abdominal pain 07/15/2017   Allergic rhinitis, seasonal 06/14/2014   Chronic neck pain 06/14/2014   Gastroesophageal reflux disease without esophagitis 06/14/2014   PCP:  Derinda Late, MD Pharmacy:   [id number]- [place], [id number]: [phone]: [phone]    Social Determinants of Health (SDOH) Interventions    Readmission Risk Interventions     No data to display

## 2022-04-15 NOTE — ED Notes (Signed)
Informed RN bed assigned 

## 2022-04-16 LAB — URINE CULTURE: Culture: 40000 — AB

## 2022-04-19 LAB — CULTURE, BLOOD (ROUTINE X 2)
Culture: NO GROWTH
Culture: NO GROWTH

## 2022-04-22 ENCOUNTER — Encounter: Payer: Self-pay | Admitting: Surgery

## 2022-04-22 ENCOUNTER — Ambulatory Visit (INDEPENDENT_AMBULATORY_CARE_PROVIDER_SITE_OTHER): Payer: Medicare HMO | Admitting: Surgery

## 2022-04-22 VITALS — BP 119/71 | HR 64 | Temp 97.7°F | Ht 72.0 in | Wt 190.4 lb

## 2022-04-22 DIAGNOSIS — K5792 Diverticulitis of intestine, part unspecified, without perforation or abscess without bleeding: Secondary | ICD-10-CM | POA: Diagnosis not present

## 2022-04-22 NOTE — Patient Instructions (Addendum)
Advised to pursue a goal of 25 to 30 g of fiber daily.  Made aware that the majority of this may be through natural sources, but advised to be aware of actual consumption and to ensure minimal consumption by daily supplementation.  Various forms of supplements discussed.  Recommended Psyllium husk, that mixes well with applesauce, or the powder which goes down well shaken with chocolate milk.  Strongly advised to consume more fluids to ensure adequate hydration, instructed to watch color of urine to determine adequacy of hydration.  Clarity is pursued in urine output, and bowel activity that correlates to significant meal intake.   We need to avoid deferring having bowel movements, advised to take the time at the first sign of sensation, typically following meals, and in the morning.   Subsequent utilization of MiraLAX may be needed ensure at least daily movement, ideally twice daily bowel movements.  If multiple doses of MiraLAX are necessary utilize them. Never skip a day...  To be regular, we must do the above EVERY day.    If you have any concerns or questions, please feel free to call our office. Follow up as needed.   Diverticulitis  Diverticulitis is when small pouches in your colon (large intestine) get infected or swollen. This causes pain in the belly (abdomen) and watery poop (diarrhea). These pouches are called diverticula. The pouches form in people who have a condition called diverticulosis. What are the causes? This condition may be caused by poop (stool) that gets trapped in the pouches in your colon. The poop lets germs (bacteria) grow in the pouches. This causes the infection. What increases the risk? You are more likely to get this condition if you have small pouches in your colon. The risk is higher if: You are overweight or very overweight (obese). You do not exercise enough. You drink alcohol. You smoke or use products with tobacco in them. You eat a diet that has a lot of  red meat such as beef, pork, or lamb. You eat a diet that does not have enough fiber in it. You are older than 75 years of age. What are the signs or symptoms? Pain in the belly. Pain is often on the left side, but it may be in other areas. Fever and feeling cold. Feeling like you may vomit. Vomiting. Having cramps. Feeling full. Changes to how often you poop. Blood in your poop. How is this treated? Most cases are treated at home by: Taking over-the-counter pain medicines. Following a clear liquid diet. Taking antibiotic medicines. Resting. Very bad cases may need to be treated at a hospital. This may include: Not eating or drinking. Taking prescription pain medicine. Getting antibiotic medicines through an IV tube. Getting fluid and food through an IV tube. Having surgery. When you are feeling better, your doctor may tell you to have a test to check your colon (colonoscopy). Follow these instructions at home: Medicines Take over-the-counter and prescription medicines only as told by your doctor. These include: Antibiotics. Pain medicines. Fiber pills. Probiotics. Stool softeners. If you were prescribed an antibiotic medicine, take it as told by your doctor. Do not stop taking the antibiotic even if you start to feel better. Ask your doctor if the medicine prescribed to you requires you to avoid driving or using machinery. Eating and drinking  Follow a diet as told by your doctor. When you feel better, your doctor may tell you to change your diet. You may need to eat a lot of fiber.  Fiber makes it easier to poop (have a bowel movement). Foods with fiber include: Berries. Beans. Lentils. Green vegetables. Avoid eating red meat. General instructions Do not use any products that contain nicotine or tobacco, such as cigarettes, e-cigarettes, and chewing tobacco. If you need help quitting, ask your doctor. Exercise 3 or more times a week. Try to get 30 minutes each time.  Exercise enough to sweat and make your heart beat faster. Keep all follow-up visits as told by your doctor. This is important. Contact a doctor if: Your pain does not get better. You are not pooping like normal. Get help right away if: Your pain gets worse. Your symptoms do not get better. Your symptoms get worse very fast. You have a fever. You vomit more than one time. You have poop that is: Bloody. Black. Tarry. Summary This condition happens when small pouches in your colon get infected or swollen. Take medicines only as told by your doctor. Follow a diet as told by your doctor. Keep all follow-up visits as told by your doctor. This is important. This information is not intended to replace advice given to you by your health care provider. Make sure you discuss any questions you have with your health care provider. Document Revised: 02/08/2019 Document Reviewed: 02/08/2019 Elsevier Patient Education  [place].

## 2022-04-22 NOTE — Progress Notes (Signed)
04/22/2022  History of Present Illness: Donald Barry is a 76 y.o. male presenting for follow up of acute diverticulitis with microperforation.  He was admitted on 03/29/22 with diverticulitis with foci of extraluminal gas, without free air or abscess.  There was also concern for UTI and has a history of cystectomy and ileal conduit.  On CT scan, the ureters were dilated as well and Dr. Diamantina Providence saw the patient.  He was treated with antibiotics and discharged on 11/20.  He reports that he finished the antibiotic course but a few days after that started having pain again in the left lower side.  He was re-admitted on 04/14/22 and on repeat CT scan, he was noted to have left sided pyelonephritis, with some improvement in the diverticulitis changes.  He was discharged the next day on cipro/flagyl.  He still has a few days left of abx course and reports that his main issue now is diarrhea.  His urine is very clear, and he denies any abdominal pain.  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 SCCis   Squamous cell skin cancer 07/2017   Resected from scalp.     Past Surgical History: Past Surgical History:  Procedure Laterality Date   BACK SURGERY  2008   L4, L5   COLONOSCOPY     cystectomy with prostatectomy ileal conduit     CYSTO WITH  HYDRODISTENSION N/A 06/17/2019   Procedure: CYSTOSCOPY/HYDRODISTENSION;  Surgeon: Billey Co, MD;  Location: [facility] ORS;  Service: Urology;  Laterality: N/A;   CYSTOSCOPY WITH BIOPSY N/A 07/17/2018   Procedure: CYSTOSCOPY WITH BLADDER BIOPSY;  Surgeon: Billey Co, MD;  Location: [facility] ORS;  Service: Urology;  Laterality: N/A;   CYSTOSCOPY WITH BIOPSY N/A 06/17/2019   Procedure: CYSTOSCOPY WITH BLADDER  BIOPSY;  Surgeon: Billey Co, MD;  Location: [facility] OR[facility];  Service: Urology;  Laterality: N/A;   CYSTOSCOPY WITH FULGERATION N/A 07/17/2018   Procedure: CYSTOSCOPY WITH FULGERATION;  Surgeon: Billey Co, MD;  Location: [facility];  Service: Urology;  Laterality: N/A;   CYSTOSCOPY WITH FULGERATION N/A 06/17/2019   Procedure: CYSTOSCOPY WITH FULGERATION;  Surgeon: Billey Co, MD;  Location: [facility] OR[facility];  Service: Urology;  Laterality: N/A;   ESOPHAGOGASTRODUODENOSCOPY     HERNIA REPAIR  [id number]   umbilical   STOMACH SURGERY     FUNDIC GLAND POLYP    Home Medications: Prior to Admission medications   Medication Sig Start Date End Date Taking? Authorizing Provider  apixaban (ELIQUIS) 5 MG TABS tablet Take 1 tablet (5 mg total) by mouth 2 (two) times daily. 02/07/22  Yes Dunn, Areta Haber, PA-C  atorvastatin (LIPITOR) 10 MG tablet Take 1 tablet by mouth daily. 08/06/19  Yes [provider]  ciprofloxacin (CIPRO) 500  MG tablet Take 1 tablet (500 mg total) by mouth 2 (two) times daily for 10 days. 04/15/22 04/25/22 Yes Wyvonnia Dusky, MD  Coenzyme Q10 (COQ10) 100 MG CAPS Take 1 capsule by mouth daily.   Yes [provider]  ferrous sulfate 325 (65 FE) MG EC tablet TAKE 1 TABLET BY MOUTH EVERY DAY 12/05/21  Yes Sindy Guadeloupe, MD  fluticasone (FLONASE) 50 MCG/ACT nasal spray Place 1 spray into both nostrils daily.   Yes [provider]  guaiFENesin (MUCINEX) 600 MG 12 hr tablet Take by mouth 2 (two) times daily.   Yes [provider]  loratadine  (CLARITIN) 10 MG tablet Take 1 tablet by mouth daily.   Yes [provider]  metoprolol tartrate (LOPRESSOR) 25 MG tablet Take 0.5 tablets (12.5 mg total) by mouth 2 (two) times daily. 06/13/21  Yes Delman Kitten, MD  Multiple Vitamin (MULTIVITAMIN WITH MINERALS) TABS tablet Take 1 tablet by mouth daily. Iron Free   Yes [provider]  NON FORMULARY Place 1 Dose/kg into the nose daily as needed. Netty pot for sinuses   Yes [provider]  omeprazole (PRILOSEC) 40 MG capsule Take 1 capsule by mouth daily. 10/25/19  Yes [provider]  traZODone (DESYREL) 50 MG tablet Take 50 mg by mouth at bedtime as needed. 12/20/21  Yes [provider]    Allergies: Allergies  Allergen Reactions   Tape Dermatitis    Skin blistered- steri strip  Skin blistered   Wound Dressing Adhesive Dermatitis    Skin blistered- steri strip   Shellfish Allergy Diarrhea and Nausea And Vomiting    Shrimp    Shellfish-Derived Products Diarrhea, Nausea And Vomiting and Nausea Only   Onion Other (See Comments)    diarrhea Diarrhea- verified avoids all onion ingredient (including onion powder). MM, RD 12/19/20   Other Nausea And Vomiting    general anesthesia   Codeine Nausea Only    Review of Systems: Review of Systems  Constitutional:  Negative for chills and fever.  Respiratory:  Negative for shortness of breath.   Cardiovascular:  Negative for chest pain.  Gastrointestinal:  Positive for diarrhea. Negative for abdominal pain, nausea and vomiting.  Genitourinary:  Negative for dysuria.    Physical Exam BP 119/71   Pulse 64   Temp 97.7 F (36.5 C) (Oral)   Ht 6' (1.829 m)   Wt 190 lb 6.4 oz (86.4 kg)   SpO2 98%   BMI 25.82 kg/m  CONSTITUTIONAL: No acute distress, well nourished. HEENT:  Normocephalic, atraumatic, extraocular motion intact. RESPIRATORY:  Normal respiratory effort without pathologic use of accessory muscles. CARDIOVASCULAR: Regular rhythm and  rate. GI: The abdomen is soft, non-distended, currently non-tender to palpation.  Right sided urostomy with clear yellow urine.  No left lower quadrant tenderness.  NEUROLOGIC:  Motor and sensation is grossly normal.  Cranial nerves are grossly intact. PSYCH:  Alert and oriented to person, place and time. Affect is normal.  Labs/Imaging: CT abdomen/pelvis on 03/29/22: IMPRESSION: 1. Acute, perforated sigmoid diverticulitis. No evidence of obstruction, intra-abdominal abscess formation, or gross free intraperitoneal gas. 2. Surgical changes of cystectomy and ileal conduit formation with a right mid abdominal urostomy. Mild right and moderate left hydronephrosis with delayed left renal cortical enhancement related to changes of obstructive uropathy. Superimposed mild left periureteric inflammatory stranding and marked urothelial enhancement suggesting superimposed infection. Correlation with urinalysis and urine culture may be helpful. 3. Cholelithiasis. 4. Large hiatal hernia. 5. Scrotal wall thickening with  punctate gas within the left hemiscrotum, likely postsurgical in nature given the history of recent hydrocelectomy. Small left varicocele noted. 6.  Aortic Atherosclerosis (ICD10-I70.0).   CT abdomen/pelvis on 04/14/22: IMPRESSION: 1. 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 Increased left pelviectasis, perinephric stranding and periureteral stranding likely indicates ascending urinary tract infection. 2. Mild residual inflammation at the site of recent sigmoid colon diverticulitis.   Assessment and Plan: This is a 76 y.o. male with diverticulitis  --Discussed with the patient the findings on his two CT scans.  His repeat CT scan had improved inflammatory changes about his diverticulitis.  Discussed with him that his episode is a complicated episode of diverticulitis, though I would say on the lower end of spectrum within the complicated range, given  only minute foci of gas.  Although surgery could be recommended in this scenario, my bigger concern is that the area involved is adjacent to the left ureter, and I think there's a high risk of injury to complications to the left ureter if we were to try sigmoidectomy.  Given that this episode was less of a complicated episode, I think in his particular scenario, we can continue with watchful waiting and conservative measures.  Discussed with him to complete his antibiotic course and next week he can start with a high fiber diet.  If any surgery is needed in an elective basis, I think it would be best for it to be done at [facility] where his ileal conduit was done, so that both General Surgery and Urology can be involved in his case.   --For now, follow up as needed.  Return precautions given.  I spent 20 minutes dedicated to the care of this patient on the date of this encounter to include pre-visit review of records, face-to-face time with the patient discussing diagnosis and management, and any post-visit coordination of care.   Melvyn Neth, [facility]

## 2022-04-25 ENCOUNTER — Ambulatory Visit: Payer: Managed Care, Other (non HMO) | Admitting: Dermatology

## 2022-05-01 ENCOUNTER — Encounter: Payer: Self-pay | Admitting: Oncology

## 2022-05-02 ENCOUNTER — Other Ambulatory Visit: Payer: Self-pay

## 2022-05-02 ENCOUNTER — Encounter: Payer: Medicare HMO | Admitting: Dermatology

## 2022-05-02 DIAGNOSIS — D508 Other iron deficiency anemias: Secondary | ICD-10-CM

## 2022-05-03 ENCOUNTER — Inpatient Hospital Stay: Payer: Medicare HMO | Attending: Oncology | Admitting: Oncology

## 2022-05-03 ENCOUNTER — Inpatient Hospital Stay: Payer: Medicare HMO

## 2022-05-03 ENCOUNTER — Inpatient Hospital Stay: Payer: Medicare HMO | Admitting: Oncology

## 2022-05-03 ENCOUNTER — Encounter: Payer: Self-pay | Admitting: Oncology

## 2022-05-03 VITALS — BP 112/82 | HR 89 | Temp 97.8°F | Resp 16 | Ht 72.0 in | Wt 187.0 lb

## 2022-05-03 DIAGNOSIS — D472 Monoclonal gammopathy: Secondary | ICD-10-CM | POA: Insufficient documentation

## 2022-05-03 DIAGNOSIS — D509 Iron deficiency anemia, unspecified: Secondary | ICD-10-CM | POA: Insufficient documentation

## 2022-05-03 DIAGNOSIS — Z85828 Personal history of other malignant neoplasm of skin: Secondary | ICD-10-CM | POA: Insufficient documentation

## 2022-05-03 DIAGNOSIS — D508 Other iron deficiency anemias: Secondary | ICD-10-CM

## 2022-05-03 LAB — CBC WITH DIFFERENTIAL/PLATELET
Abs Immature Granulocytes: 0.05 10*3/uL (ref 0.00–0.07)
Basophils Absolute: 0 10*3/uL (ref 0.0–0.1)
Basophils Relative: 1 %
Eosinophils Absolute: 0.1 10*3/uL (ref 0.0–0.5)
Eosinophils Relative: 1 %
HCT: 39.6 % (ref 39.0–52.0)
Hemoglobin: 13.3 g/dL (ref 13.0–17.0)
Immature Granulocytes: 1 %
Lymphocytes Relative: 15 %
Lymphs Abs: 1 10*3/uL (ref 0.7–4.0)
MCH: 30.6 pg (ref 26.0–34.0)
MCHC: 33.6 g/dL (ref 30.0–36.0)
MCV: 91.2 fL (ref 80.0–100.0)
Monocytes Absolute: 0.8 10*3/uL (ref 0.1–1.0)
Monocytes Relative: 13 %
Neutro Abs: 4.3 10*3/uL (ref 1.7–7.7)
Neutrophils Relative %: 69 %
Platelets: 163 10*3/uL (ref 150–400)
RBC: 4.34 MIL/uL (ref 4.22–5.81)
RDW: 14.9 % (ref 11.5–15.5)
WBC: 6.2 10*3/uL (ref 4.0–10.5)
nRBC: 0 % (ref 0.0–0.2)

## 2022-05-03 LAB — IRON AND TIBC
Iron: 43 ug/dL — ABNORMAL LOW (ref 45–182)
Saturation Ratios: 16 % — ABNORMAL LOW (ref 17.9–39.5)
TIBC: 263 ug/dL (ref 250–450)
UIBC: 220 ug/dL

## 2022-05-03 LAB — FERRITIN: Ferritin: 337 ng/mL — ABNORMAL HIGH (ref 24–336)

## 2022-05-03 NOTE — Progress Notes (Signed)
Hematology/Oncology Consult note [facility]  Telephone:([phone] [phone]  Patient Care Team: Derinda Late, MD as PCP - General (Family Medicine) Wellington Hampshire, MD as PCP - Cardiology (Cardiology) Sindy Guadeloupe, MD as Consulting Physician (Hematology and Oncology)   Name of the patient: Donald Barry  [id number]  1945/12/22   Date of visit: 05/03/22  Diagnosis-iron deficiency anemia IgM MGUS  Chief complaint/ Reason for visit- routine f/u of anemia and MGUS  Heme/Onc history: patient is a 76 year old male with a past medical history of fatty liver and GERD who was seen by pulmonology clinic GI for symptoms of abdominal pain.  As a part of that work-up patient had SPEP done Which showed mildly elevated IgM levels of 202.  IgM monoclonal kappa light chain was noted on immunofixation 0.2 g.  Hence the patient has been referred to [place].  Of note patient had a normal CBC with a white count of 4.1, H&H of 14.8/43.2 in February 2020.  His platelet counts have always been mildly low in the 120s.  CMP was unremarkable.  Iron studies were normal.  Patient also had CT of the abdomen done as a part of microscopic hematuria in February 2020 which did not reveal any evidence of splenomegaly   Results of blood work from 09/24/2018 were as follows: CBC showed white count of 4.7, H&H of 14.7/43.1.  Count of 147.  CMP was within normal limits.  Multiple myeloma panel showed M protein IgM kappa 0.3 g.  Serum free light chains revealed a mildly elevated kappa free light chain 21.6 with a kappa lambda light chain ratio of 1.86.    Interval history- reports fatigue.Was in the hospital about 2 weeks ago with symptoms of urinary tract infection requiring antibiotics.  ECOG PS- 1 Pain scale- 0  Review of systems- Review of Systems  Constitutional:  Positive for malaise/fatigue. Negative for chills, fever and weight loss.  HENT:  Negative for congestion, ear  discharge and nosebleeds.   Eyes:  Negative for blurred vision.  Respiratory:  Negative for cough, hemoptysis, sputum production, shortness of breath and wheezing.   Cardiovascular:  Negative for chest pain, palpitations, orthopnea and claudication.  Gastrointestinal:  Negative for abdominal pain, blood in stool, constipation, diarrhea, heartburn, melena, nausea and vomiting.  Genitourinary:  Negative for dysuria, flank pain, frequency, hematuria and urgency.  Musculoskeletal:  Negative for back pain, joint pain and myalgias.  Skin:  Negative for rash.  Neurological:  Negative for dizziness, tingling, focal weakness, seizures, weakness and headaches.  Endo/Heme/Allergies:  Does not bruise/bleed easily.  Psychiatric/Behavioral:  Negative for depression and suicidal ideas. The patient does not have insomnia.       Allergies  Allergen Reactions   Tape Dermatitis    Skin blistered- steri strip  Skin blistered   Wound Dressing Adhesive Dermatitis    Skin blistered- steri strip   Shellfish Allergy Diarrhea and Nausea And Vomiting    Shrimp    Shellfish-Derived Products Diarrhea, Nausea And Vomiting and Nausea Only   Methenamine Diarrhea and Nausea And Vomiting   Onion Other (See Comments)    diarrhea Diarrhea- verified avoids all onion ingredient (including onion powder). MM, RD 12/19/20   Other Nausea And Vomiting    general anesthesia   Codeine Nausea Only     Past Medical History:  Diagnosis Date   A-fib ([place])    Actinic keratosis    Hx of PDT Tx., face 1/252021 and scalp 07/05/2019  Allergic rhinitis, seasonal 06/14/2014   Allergy    Arthritis    Chronic neck pain 46/96/2952   Complication of anesthesia    Gastroesophageal reflux disease without esophagitis 06/14/2014   Headache    h/o migraines   Hepatic steatosis 04/05/2018   Hiatal hernia 09/09/2017   Intermittent left lower quadrant abdominal pain 07/15/2017   PONV (postoperative nausea and vomiting)    Sleep  apnea    cpap   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 SCCis   Squamous cell skin cancer 07/2017   Resected from scalp.     Past Surgical History:  Procedure Laterality Date   BACK SURGERY  2008   L4, L5   COLONOSCOPY     cystectomy with prostatectomy ileal conduit     CYSTO WITH HYDRODISTENSION N/A 06/17/2019   Procedure: CYSTOSCOPY/HYDRODISTENSION;  Surgeon: Billey Co, MD;  Location: [facility] ORS;  Service: Urology;  Laterality: N/A;   CYSTOSCOPY WITH BIOPSY N/A 07/17/2018   Procedure: CYSTOSCOPY WITH BLADDER BIOPSY;  Surgeon: Billey Co, MD;  Location: [facility] ORS;  Service: Urology;  Laterality: N/A;   CYSTOSCOPY WITH BIOPSY N/A 06/17/2019   Procedure: CYSTOSCOPY WITH BLADDER  BIOPSY;  Surgeon: Billey Co, MD;  Location: [facility] ORS;  Service: Urology;  Laterality: N/A;   CYSTOSCOPY WITH FULGERATION N/A 07/17/2018   Procedure: CYSTOSCOPY WITH FULGERATION;  Surgeon: Billey Co, MD;  Location: [facility] OR[facility];  Service: Urology;  Laterality: N/A;   CYSTOSCOPY WITH FULGERATION N/A 06/17/2019   Procedure: CYSTOSCOPY WITH FULGERATION;  Surgeon: Billey Co, MD;  Location: [facility] OR[facility];  Service: Urology;  Laterality: N/A;   ESOPHAGOGASTRODUODENOSCOPY     HERNIA REPAIR  8413   umbilical   STOMACH SURGERY     FUNDIC GLAND POLYP    Social History   Socioeconomic History   Marital status: Married    Spouse name: Not on file   Number of children: Not on file   Years of education: Not on file   Highest education level: Not on file  Occupational History   Not on file  Tobacco Use   Smoking status: Never   Smokeless tobacco: Never  Vaping Use   Vaping Use: Never used  Substance and Sexual Activity   Alcohol use: Never   Drug use: Never   Sexual activity: Yes    Birth control/protection: None  Other Topics Concern   Not on file  Social History Narrative   Not on  file   Social Determinants of Health   Financial Resource Strain: Not on file  Food Insecurity: No Food Insecurity (03/29/2022)   Hunger Vital Sign    Worried About Running Out of Food in the Last Year: Never true    Ran Out of Food in the Last Year: Never true  Transportation Needs: No Transportation Needs (03/29/2022)   PRAPARE - Hydrologist (Medical): No    Lack of Transportation (Non-Medical): No  Physical Activity: Not on file  Stress: Not on file  Social Connections: Not on file  Intimate Partner Violence: Not on file    Family History  Problem Relation Age of Onset   Hypertension Mother    Heart disease Father    GER disease Sister    GER disease Brother    Prostate cancer Neg Hx    Kidney cancer Neg Hx  Current Outpatient Medications:    apixaban (ELIQUIS) 5 MG TABS tablet, Take 1 tablet (5 mg total) by mouth 2 (two) times daily., Disp: 60 tablet, Rfl: 11   atorvastatin (LIPITOR) 10 MG tablet, Take 1 tablet by mouth daily., Disp: , Rfl:    Coenzyme Q10 (COQ10) 100 MG CAPS, Take 1 capsule by mouth daily., Disp: , Rfl:    ferrous sulfate 325 (65 FE) MG EC tablet, TAKE 1 TABLET BY MOUTH EVERY DAY, Disp: 90 tablet, Rfl: 1   fluticasone (FLONASE) 50 MCG/ACT nasal spray, Place 1 spray into both nostrils daily., Disp: , Rfl:    guaiFENesin (MUCINEX) 600 MG 12 hr tablet, Take by mouth 2 (two) times daily., Disp: , Rfl:    loratadine (CLARITIN) 10 MG tablet, Take 1 tablet by mouth daily., Disp: , Rfl:    meclizine (ANTIVERT) 25 MG tablet, Take 25 mg by mouth 3 (three) times daily., Disp: , Rfl:    metoprolol tartrate (LOPRESSOR) 25 MG tablet, Take 0.5 tablets (12.5 mg total) by mouth 2 (two) times daily., Disp: 30 tablet, Rfl: 0   Multiple Vitamin (MULTIVITAMIN WITH MINERALS) TABS tablet, Take 1 tablet by mouth daily. Iron Free, Disp: , Rfl:    NON FORMULARY, Place 1 Dose/kg into the nose daily as needed. Netty pot for sinuses, Disp: , Rfl:     omeprazole (PRILOSEC) 40 MG capsule, Take 1 capsule by mouth daily., Disp: , Rfl:    traZODone (DESYREL) 50 MG tablet, Take 50 mg by mouth at bedtime as needed., Disp: , Rfl:   Physical exam:  Vitals:   05/03/22 1438  BP: 112/82  Pulse: 89  Resp: 16  Temp: 97.8 F (36.6 C)  TempSrc: Tympanic  SpO2: 98%  Weight: 187 lb (84.8 kg)  Height: 6' (1.829 m)   Physical Exam Cardiovascular:     Rate and Rhythm: Normal rate and regular rhythm.     Heart sounds: Normal heart sounds.  Pulmonary:     Effort: Pulmonary effort is normal.     Breath sounds: Normal breath sounds.  Abdominal:     General: Bowel sounds are normal.     Palpations: Abdomen is soft.  Skin:    General: Skin is warm and dry.  Neurological:     Mental Status: He is alert and oriented to person, place, and time.         Latest Ref Rng & Units 04/14/2022    6:19 AM  CMP  Glucose 70 - 99 mg/dL 113   BUN 8 - 23 mg/dL 17   Creatinine 0.61 - 1.24 mg/dL 0.88   Sodium 135 - 145 mmol/L 140   Potassium 3.5 - 5.1 mmol/L 3.9   Chloride 98 - 111 mmol/L 111   CO2 22 - 32 mmol/L 22   Calcium 8.9 - 10.3 mg/dL 8.2       Latest Ref Rng & Units 05/03/2022    1:57 PM  CBC  WBC 4.0 - 10.5 K/uL 6.2   Hemoglobin 13.0 - 17.0 g/dL 13.3   Hematocrit 39.0 - 52.0 % 39.6   Platelets 150 - 400 K/uL 163     No images are attached to the encounter.  CT ABDOMEN PELVIS W CONTRAST  Result Date: 04/14/2022 CLINICAL DATA:  Left lower quadrant abdominal pain EXAM: CT ABDOMEN AND PELVIS WITH CONTRAST TECHNIQUE: Multidetector CT imaging of the abdomen and pelvis was performed using the standard protocol following bolus administration of intravenous contrast. RADIATION DOSE REDUCTION: This exam was performed  according to the departmental dose-optimization program which includes automated exposure control, adjustment of the mA and/or kV according to patient size and/or use of iterative reconstruction technique. CONTRAST:  125m OMNIPAQUE  IOHEXOL 300 MG/ML  SOLN COMPARISON:  03/29/2022 FINDINGS: Lower Chest: Basilar atelectasis Hepatobiliary: Normal hepatic contours. No intra- or extrahepatic biliary dilatation. There is cholelithiasis without acute inflammation. Pancreas: Normal pancreas. No ductal dilatation or peripancreatic fluid collection. Spleen: Normal. Adrenals/Urinary Tract: The adrenal glands are normal. 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 Unchanged bilateral hydroureter. There is worsened left pelviectasis, perinephric stranding and periureteral stranding. Stomach/Bowel: Small duodenal diverticulum and intermediate sized hiatal hernia. No small bowel dilatation or inflammation. Mild residual inflammation at the site of recent sigmoid colon diverticulitis. No new site of colonic inflammation. Normal appendix. Vascular/Lymphatic: There is calcific atherosclerosis of the abdominal aorta. No lymphadenopathy. Reproductive: Normal prostate size with symmetric seminal vesicles. Other: None. Musculoskeletal: No bony spinal canal stenosis or focal osseous abnormality. IMPRESSION: 1. 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 Increased left pelviectasis, perinephric stranding and periureteral stranding likely indicates ascending urinary tract infection. 2. Mild residual inflammation at the site of recent sigmoid colon diverticulitis. Aortic atherosclerosis (ICD10-I70.0). Electronically Signed   By: KUlyses JarredM.D.   On: 04/14/2022 02:48     Assessment and plan- Patient is a 76y.o. male here for routine follow-up of iron deficiency anemia and MGUS  Patient is not presently anemic with a normal hemoglobin of 13.3.   Visit Diagnosis 1. Iron deficiency anemia, unspecified iron deficiency anemia type   2. MGUS (monoclonal gammopathy of unknown significance)      Dr. ARanda Evens MD, MPH [id number]/22/2023 4:10  PM

## 2022-05-31 ENCOUNTER — Other Ambulatory Visit: Payer: Self-pay | Admitting: Oncology

## 2022-06-11 ENCOUNTER — Ambulatory Visit: Payer: Medicare HMO | Admitting: Dermatology

## 2022-06-11 ENCOUNTER — Encounter: Payer: Self-pay | Admitting: Dermatology

## 2022-06-11 DIAGNOSIS — Z1283 Encounter for screening for malignant neoplasm of skin: Secondary | ICD-10-CM

## 2022-06-11 DIAGNOSIS — L820 Inflamed seborrheic keratosis: Secondary | ICD-10-CM

## 2022-06-11 DIAGNOSIS — L814 Other melanin hyperpigmentation: Secondary | ICD-10-CM

## 2022-06-11 DIAGNOSIS — K130 Diseases of lips: Secondary | ICD-10-CM | POA: Diagnosis not present

## 2022-06-11 DIAGNOSIS — L570 Actinic keratosis: Secondary | ICD-10-CM | POA: Diagnosis not present

## 2022-06-11 DIAGNOSIS — Z85828 Personal history of other malignant neoplasm of skin: Secondary | ICD-10-CM | POA: Diagnosis not present

## 2022-06-11 DIAGNOSIS — D492 Neoplasm of unspecified behavior of bone, soft tissue, and skin: Secondary | ICD-10-CM

## 2022-06-11 DIAGNOSIS — Z86007 Personal history of in-situ neoplasm of skin: Secondary | ICD-10-CM

## 2022-06-11 DIAGNOSIS — L929 Granulomatous disorder of the skin and subcutaneous tissue, unspecified: Secondary | ICD-10-CM | POA: Diagnosis not present

## 2022-06-11 DIAGNOSIS — L821 Other seborrheic keratosis: Secondary | ICD-10-CM

## 2022-06-11 MED ORDER — KETOCONAZOLE 2 % EX CREA: 1.0000 | TOPICAL_CREAM | Freq: Two times a day (BID) | CUTANEOUS | 0 refills | Status: AC

## 2022-06-11 MED ORDER — MUPIROCIN 2 % EX OINT: 1.0000 | TOPICAL_OINTMENT | Freq: Two times a day (BID) | CUTANEOUS | 0 refills | Status: DC

## 2022-06-11 MED ORDER — HYDROCORTISONE 2.5 % EX CREA: TOPICAL_CREAM | Freq: Two times a day (BID) | CUTANEOUS | 0 refills | Status: DC | PRN

## 2022-06-11 NOTE — Patient Instructions (Addendum)
Cryotherapy Aftercare  Wash gently with soap and water everyday.   Apply Vaseline and Band-Aid daily until healed.   Start ketoconazole 2% cream twice daily followed by [facility] 2.5% cream and then mupirocin for up to 2 weeks to corners of mouth.   Wound Care Instructions  Cleanse wound gently with soap and water once a day then pat dry with clean gauze. Apply a thin coat of Petrolatum (petroleum jelly, "Vaseline") over the wound (unless you have an allergy to this). We recommend that you use a new, sterile tube of Vaseline. Do not pick or remove scabs. Do not remove the yellow or white "healing tissue" from the base of the wound.  Cover the wound with fresh, clean, nonstick gauze and secure with paper tape. You may use Band-Aids in place of gauze and tape if the wound is small enough, but would recommend trimming much of the tape off as there is often too much. Sometimes Band-Aids can irritate the skin.  You should call the office for your biopsy report after 1 week if you have not already been contacted.  If you experience any problems, such as abnormal amounts of bleeding, swelling, significant bruising, significant pain, or evidence of infection, please call the office immediately.  FOR ADULT SURGERY PATIENTS: If you need something for pain relief you may take 1 extra strength Tylenol (acetaminophen) AND 2 Ibuprofen ('200mg'$  each) together every 4 hours as needed for pain. (do not take these if you are allergic to them or if you have a reason you should not take them.) Typically, you may only need pain medication for 1 to 3 days.   Recommend taking Heliocare sun protection supplement daily in sunny weather for additional sun protection. For maximum protection on the sunniest days, you can take up to 2 capsules of regular Heliocare OR take 1 capsule of Heliocare Ultra. For prolonged exposure (such as a full day in the sun), you can repeat your dose of the supplement 4 hours after your first dose.  Heliocare can be purchased at [employer], at some [facility] or at [email or web address].    Melanoma ABCDEs  Melanoma is the most dangerous type of skin cancer, and is the leading cause of death from skin disease.  You are more likely to develop melanoma if you: Have light-colored skin, light-colored eyes, or red or blond hair Spend a lot of time in the sun Tan regularly, either outdoors or in a tanning bed Have had blistering sunburns, especially during childhood Have a close family member who has had a melanoma Have atypical moles or large birthmarks  Early detection of melanoma is key since treatment is typically straightforward and cure rates are extremely high if we catch it early.   The first sign of melanoma is often a change in a mole or a new dark spot.  The ABCDE system is a way of remembering the signs of melanoma.  A for asymmetry:  The two halves do not match. B for border:  The edges of the growth are irregular. C for color:  A mixture of colors are present instead of an even brown color. D for diameter:  Melanomas are usually (but not always) greater than 37m - the size of a pencil eraser. E for evolution:  The spot keeps changing in size, shape, and color.  Please check your skin once per month between visits. You can use a small mirror in front and a large mirror behind you to keep an eye on  the back side or your body.   If you see any new or changing lesions before your next follow-up, please call to schedule a visit.  Please continue daily skin protection including broad spectrum sunscreen SPF 30+ to sun-exposed areas, reapplying every 2 hours as needed when you're outdoors.    Due to recent changes in healthcare laws, you may see results of your pathology and/or laboratory studies on MyChart before the doctors have had a chance to review them. We understand that in some cases there may be results that are confusing or concerning to you. Please understand that not  all results are received at the same time and often the doctors may need to interpret multiple results in order to provide you with the best plan of care or course of treatment. Therefore, we ask that you please give [place] 2 business days to thoroughly review all your results before contacting the office for clarification. Should we see a critical lab result, you will be contacted sooner.   If You Need Anything After Your Visit  If you have any questions or concerns for your doctor, please call our main line at [phone] and press option 4 to reach your doctor's medical assistant. If no one answers, please leave a voicemail as directed and we will return your call as soon as possible. Messages left after 4 pm will be answered the following business day.   You may also send [place] a message via [place]. We typically respond to MyChart messages within 1-2 business days.  For prescription refills, please ask your pharmacy to contact our office. Our fax number is [phone].  If you have an urgent issue when the clinic is closed that cannot wait until the next business day, you can page your doctor at the number below.    Please note that while we do our best to be available for urgent issues outside of office hours, we are not available 24/7.   If you have an urgent issue and are unable to reach [place], you may choose to seek medical care at your doctor's office, retail clinic, urgent care center, or emergency room.  If you have a medical emergency, please immediately call 911 or go to the emergency department.  Pager Numbers  - Dr. Nehemiah Massed: [phone]  - Dr. Laurence Ferrari: [phone]  - Dr. Nicole Kindred: [phone]  In the event of inclement weather, please call our main line at [phone] for an update on the status of any delays or closures.  Dermatology Medication Tips: Please keep the boxes that topical medications come in in order to help keep track of the instructions about where and how to  use these. Pharmacies typically print the medication instructions only on the boxes and not directly on the medication tubes.   If your medication is too expensive, please contact our office at [phone] option 4 or send [place] a message through [place].   We are unable to tell what your co-pay for medications will be in advance as this is different depending on your insurance coverage. However, we may be able to find a substitute medication at lower cost or fill out paperwork to get insurance to cover a needed medication.   If a prior authorization is required to get your medication covered by your insurance company, please allow [place] 1-2 business days to complete this process.  Drug prices often vary depending on where the prescription is filled and some pharmacies may offer cheaper prices.  The website www.goodrx.com contains coupons for  medications through different pharmacies. The prices here do not account for what the cost may be with help from insurance (it may be cheaper with your insurance), but the website can give you the price if you did not use any insurance.  - You can print the associated coupon and take it with your prescription to the pharmacy.  - You may also stop by our office during regular business hours and pick up a GoodRx coupon card.  - If you need your prescription sent electronically to a different pharmacy, notify our office through [facility] or by phone at [phone] option 4.     Si Usted Necesita Algo Despus de Su Visita  Tambin puede enviarnos un mensaje a travs de Pharmacist, community. Por lo general respondemos a los mensajes de MyChart en el transcurso de 1 a 2 das hbiles.  Para renovar recetas, por favor pida a su farmacia que se ponga en contacto con nuestra oficina. Harland Dingwall de fax es [place] [phone].  Si tiene un asunto urgente cuando la clnica est cerrada y que no puede esperar hasta el siguiente da hbil, puede llamar/localizar a su  doctor(a) al nmero que aparece a continuacin.   Por favor, tenga en cuenta que aunque hacemos todo lo posible para estar disponibles para asuntos urgentes fuera del horario de [place], no estamos disponibles las 24 horas del da, los 7 das de la [place].   Si tiene un problema urgente y no puede comunicarse con nosotros, puede optar por buscar atencin mdica  en el consultorio de su doctor(a), en una clnica privada, en un centro de atencin urgente o en una sala de emergencias.  Si tiene Engineering geologist, por favor llame inmediatamente al 911 o vaya a la sala de emergencias.  Nmeros de bper  - Dr. Nehemiah Massed: [phone]  - Dra. Moye: [phone]  - Dra. Nicole Kindred: [phone]  En caso de inclemencias del [place], por favor llame a Johnsie Kindred principal al [phone] para una actualizacin sobre el [place] de cualquier retraso o cierre.  Consejos para la medicacin en dermatologa: Por favor, guarde las cajas en las que vienen los medicamentos de uso tpico para ayudarle a seguir las instrucciones sobre dnde y cmo usarlos. Las farmacias generalmente imprimen las instrucciones del medicamento slo en las cajas y no directamente en los tubos del [place].   Si su medicamento es muy caro, por favor, pngase en contacto con Zigmund Daniel llamando al [phone] y presione la opcin 4 o envenos un mensaje a travs de Pharmacist, community.   No podemos decirle cul ser su copago por los medicamentos por adelantado ya que esto es diferente dependiendo de la cobertura de su seguro. Sin embargo, es posible que podamos encontrar un medicamento sustituto a Electrical engineer un formulario para que el seguro cubra el medicamento que se considera necesario.   Si se requiere una autorizacin previa para que su compaa de seguros [place] su medicamento, por favor permtanos de 1 a 2 das hbiles para completar este proceso.  Los precios de los medicamentos varan con frecuencia dependiendo del  Environmental consultant de dnde se surte la receta y alguna farmacias pueden ofrecer precios ms baratos.  El sitio web www.goodrx.com tiene cupones para medicamentos de Airline pilot. Los precios aqu no tienen en cuenta lo que podra costar con la ayuda del seguro (puede ser ms barato con su seguro), pero el sitio web puede darle el precio si no utiliz Research scientist (physical sciences).  - Puede imprimir el cupn correspondiente y  llevarlo con su receta a la farmacia.  - Tambin puede pasar por nuestra oficina durante el horario de atencin regular y Charity fundraiser una tarjeta de cupones de GoodRx.  - Si necesita que su receta se enve electrnicamente a una farmacia diferente, informe a nuestra oficina a travs de MyChart de [place] o por telfono llamando al [phone] y presione la opcin 4.

## 2022-06-11 NOTE — Progress Notes (Signed)
Follow-Up Visit   Subjective  Donald Barry is a 77 y.o. male who presents for the following: FBSE (Hx SCC, [place]. Patient with an area at right side that has gotten darker and is sometimes irritating. ).  The patient presents for Total-Body Skin Exam (TBSE) for skin cancer screening and mole check.  The patient has spots, moles and lesions to be evaluated, some may be new or changing and the patient has concerns that these could be cancer.  The following portions of the chart were reviewed this encounter and updated as appropriate:   Tobacco  Allergies  Meds  Problems  Med Hx  Surg Hx  Fam Hx      Review of Systems:  No other skin or systemic complaints except as noted in HPI or Assessment and Plan.  Objective  Well appearing patient in no apparent distress; mood and affect are within normal limits.  A full examination was performed including scalp, head, eyes, ears, nose, lips, neck, chest, axillae, abdomen, back, buttocks, bilateral upper extremities, bilateral lower extremities, hands, feet, fingers, toes, fingernails, and toenails. All findings within normal limits unless otherwise noted below.  Right Upper Back Erythematous stuck-on, waxy papule or plaque  Upper Mid Chest 0.4 cm pink papule R/o BCC     mid forehead x 1, R cheek x 1, R preauricular cheek x 1, scalp x 4, L cheek x 1 (8) Erythematous thin papules/macules with gritty scale.   Lips Scaly pink plaques at angles of mouth    Assessment & Plan  Inflamed seborrheic keratosis Right Upper Back  Symptomatic, irritating, patient would like treated. Rubs on clothing.  Benign-appearing.  Call clinic for new or changing lesions.   Prior to procedure, discussed risks of blister formation, small wound, skin dyspigmentation, or rare scar following cryotherapy. Recommend Vaseline ointment to treated areas while healing.   Destruction of lesion - Right Upper Back  Destruction method: cryotherapy   Informed  consent: discussed and consent obtained   Lesion destroyed using liquid nitrogen: Yes   Cryotherapy cycles:  2 Outcome: patient tolerated procedure well with no complications   Post-procedure details: wound care instructions given    Neoplasm of skin Upper Mid Chest  Skin / nail biopsy Type of biopsy: tangential   Informed consent: discussed and consent obtained   Timeout: patient name, date of birth, surgical site, and procedure verified   Procedure prep:  Patient was prepped and draped in usual sterile fashion Prep type:  Isopropyl alcohol Anesthesia: the lesion was anesthetized in a standard fashion   Anesthetic:  1% lidocaine w/ epinephrine 1-100,000 buffered w/ 8.4% NaHCO3 Instrument used: flexible razor blade   Hemostasis achieved with: aluminum chloride   Outcome: patient tolerated procedure well   Post-procedure details: wound care instructions given   Additional details:  Petrolatum and a pressure bandage applied  Specimen 1 - Surgical pathology Differential Diagnosis: R/o BCC  Check Margins: No 0.4 cm pink papule   AK (actinic keratosis) (8) mid forehead x 1, R cheek x 1, R preauricular cheek x 1, scalp x 4, L cheek x 1  Actinic keratoses are precancerous spots that appear secondary to cumulative UV radiation exposure/sun exposure over time. They are chronic with expected duration over 1 year. A portion of actinic keratoses will progress to squamous cell carcinoma of the skin. It is not possible to reliably predict which spots will progress to skin cancer and so treatment is recommended to prevent development of skin cancer.  Recommend  daily broad spectrum sunscreen SPF 30+ to sun-exposed areas, reapply every 2 hours as needed.  Recommend staying in the shade or wearing long sleeves, sun glasses (UVA+UVB protection) and wide brim hats (4-inch brim around the entire circumference of the hat). Call for new or changing lesions.  Prior to procedure, discussed risks of  blister formation, small wound, skin dyspigmentation, or rare scar following cryotherapy. Recommend Vaseline ointment to treated areas while healing.   Destruction of lesion - mid forehead x 1, R cheek x 1, R preauricular cheek x 1, scalp x 4, L cheek x 1  Destruction method: cryotherapy   Informed consent: discussed and consent obtained   Lesion destroyed using liquid nitrogen: Yes   Cryotherapy cycles:  2 Outcome: patient tolerated procedure well with no complications   Post-procedure details: wound care instructions given    Cheilitis Lips  Start ketoconazole 2% cream twice daily followed by [facility] 2.5% cream and then mupirocin for up to 2 weeks to corners of mouth.   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  A moist environment from saliva promotes overgrowth of yeast and bacteria, resulting in chronic inflammation in this area.  ketoconazole (NIZORAL) 2 % cream - Lips Apply 1 Application topically 2 (two) times daily.  hydrocortisone 2.5 % cream - Lips Apply topically 2 (two) times daily as needed (Rash). For up to 2 weeks as directed  mupirocin ointment (BACTROBAN) 2 % - Lips Apply 1 Application topically 2 (two) times daily.   History of Squamous Cell Carcinoma of the Skin - No evidence of recurrence today - No lymphadenopathy - Recommend regular full body skin exams - Recommend daily broad spectrum sunscreen SPF 30+ to sun-exposed areas, reapply every 2 hours as needed.  - Call if any new or changing lesions are noted between office visits  History of Squamous Cell Carcinoma in Situ of the Skin - No evidence of recurrence today - Recommend regular full body skin exams - Recommend daily broad spectrum sunscreen SPF 30+ to sun-exposed areas, reapply every 2 hours as needed.  - Call if any new or changing lesions are noted between office visits  Lentigines - Scattered tan macules - Due to sun exposure - Benign-appearing, observe -  Recommend daily broad spectrum sunscreen SPF 30+ to sun-exposed areas, reapply every 2 hours as needed. - Call for any changes  Seborrheic Keratoses - Stuck-on, waxy, tan-brown papules and/or plaques  - Benign-appearing - Discussed benign etiology and prognosis. - Observe - Call for any changes  Melanocytic Nevi - Tan-brown and/or pink-flesh-colored symmetric macules and papules - Benign appearing on exam today - Observation - Call clinic for new or changing moles - Recommend daily use of broad spectrum spf 30+ sunscreen to sun-exposed areas.   Hemangiomas - Red papules - Discussed benign nature - Observe - Call for any changes  Actinic Damage - Chronic condition, secondary to cumulative UV/sun exposure - diffuse scaly erythematous macules with underlying dyspigmentation - Recommend daily broad spectrum sunscreen SPF 30+ to sun-exposed areas, reapply every 2 hours as needed.  - Staying in the shade or wearing long sleeves, sun glasses (UVA+UVB protection) and wide brim hats (4-inch brim around the entire circumference of the hat) are also recommended for sun protection.  - Call for new or changing lesions.  Skin cancer screening performed today.  Return in about 6 months (around 12/10/2022) for TBSE, Hx SCC.  Graciella Belton, RMA, am acting as scribe for [name], MD .  Documentation: I have reviewed the above documentation for accuracy and completeness, and I agree with the above.  [name], MD

## 2022-06-12 ENCOUNTER — Encounter: Payer: Self-pay | Admitting: Dermatology

## 2022-06-24 ENCOUNTER — Telehealth: Payer: Self-pay

## 2022-06-24 NOTE — Telephone Encounter (Signed)
Called patient. N/A. LMOVM to C/B regarding pathology results

## 2022-06-24 NOTE — Telephone Encounter (Signed)
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 Nothing dangerous or life threatening in the meantime.   MAs please call with results and schedule. Thank you!

## 2022-07-03 ENCOUNTER — Other Ambulatory Visit: Payer: Self-pay | Admitting: Dermatology

## 2022-07-03 ENCOUNTER — Ambulatory Visit: Payer: Medicare HMO | Admitting: Dermatology

## 2022-07-03 ENCOUNTER — Encounter: Payer: Self-pay | Admitting: Dermatology

## 2022-07-03 VITALS — BP 125/64 | HR 74

## 2022-07-03 DIAGNOSIS — L905 Scar conditions and fibrosis of skin: Secondary | ICD-10-CM | POA: Diagnosis not present

## 2022-07-03 DIAGNOSIS — D4861 Neoplasm of uncertain behavior of right breast: Secondary | ICD-10-CM | POA: Diagnosis not present

## 2022-07-03 DIAGNOSIS — D489 Neoplasm of uncertain behavior, unspecified: Secondary | ICD-10-CM

## 2022-07-03 DIAGNOSIS — D4862 Neoplasm of uncertain behavior of left breast: Secondary | ICD-10-CM | POA: Diagnosis not present

## 2022-07-03 NOTE — Patient Instructions (Addendum)
Continue mupirocin 2 % ointment apply to wound at chest after washing with soap and water then cover with bandaid daily    Biopsy Wound Care Instructions  Leave the original bandage on for 24 hours if possible.  If the bandage becomes soaked or soiled before that time, it is OK to remove it and examine the wound.  A small amount of post-operative bleeding is normal.  If excessive bleeding occurs, remove the bandage, place gauze over the site and apply continuous pressure (no peeking) over the area for 30 minutes. If this does not work, please call our clinic as soon as possible or page your doctor if it is after hours.   Once a day, cleanse the wound with soap and water. It is fine to shower. If a thick crust develops you may use a Q-tip dipped into dilute hydrogen peroxide (mix 1:1 with water) to dissolve it.  Hydrogen peroxide can slow the healing process, so use it only as needed.    After washing, apply petroleum jelly (Vaseline) or an antibiotic ointment if your doctor prescribed one for you, followed by a bandage.    For best healing, the wound should be covered with a layer of ointment at all times. If you are not able to keep the area covered with a bandage to hold the ointment in place, this may mean re-applying the ointment several times a day.  Continue this wound care until the wound has healed and is no longer open.   Itching and mild discomfort is normal during the healing process. However, if you develop pain or severe itching, please call our office.   If you have any discomfort, you can take Tylenol (acetaminophen) or ibuprofen as directed on the bottle. (Please do not take these if you have an allergy to them or cannot take them for another reason).  Some redness, tenderness and white or yellow material in the wound is normal healing.  If the area becomes very sore and red, or develops a thick yellow-green material (pus), it may be infected; please notify us.    If you have  stitches, return to clinic as directed to have the stitches removed. You will continue wound care for 2-3 days after the stitches are removed.   Wound healing continues for up to one year following surgery. It is not unusual to experience pain in the scar from time to time during the interval.  If the pain becomes severe or the scar thickens, you should notify the office.    A slight amount of redness in a scar is expected for the first six months.  After six months, the redness will fade and the scar will soften and fade.  The color difference becomes less noticeable with time.  If there are any problems, return for a post-op surgery check at your earliest convenience.  To improve the appearance of the scar, you can use silicone scar gel, cream, or sheets (such as Mederma or Serica) every night for up to one year. These are available over the counter (without a prescription).  Please call our office at [phone] for any questions or concerns.       Due to recent changes in healthcare laws, you may see results of your pathology and/or laboratory studies on MyChart before the doctors have had a chance to review them. We understand that in some cases there may be results that are confusing or concerning to you. Please understand that not all results are received at  the same time and often the doctors may need to interpret multiple results in order to provide you with the best plan of care or course of treatment. Therefore, we ask that you please give [place] 2 business days to thoroughly review all your results before contacting the office for clarification. Should we see a critical lab result, you will be contacted sooner.   If You Need Anything After Your Visit  If you have any questions or concerns for your doctor, please call our main line at [phone] and press option 4 to reach your doctor's medical assistant. If no one answers, please leave a voicemail as directed and we will return your  call as soon as possible. Messages left after 4 pm will be answered the following business day.   You may also send [place] a message via [place]. We typically respond to MyChart messages within 1-2 business days.  For prescription refills, please ask your pharmacy to contact our office. Our fax number is [phone].  If you have an urgent issue when the clinic is closed that cannot wait until the next business day, you can page your doctor at the number below.    Please note that while we do our best to be available for urgent issues outside of office hours, we are not available 24/7.   If you have an urgent issue and are unable to reach [place], you may choose to seek medical care at your doctor's office, retail clinic, urgent care center, or emergency room.  If you have a medical emergency, please immediately call 911 or go to the emergency department.  Pager Numbers  - Dr. Nehemiah Massed: [phone]  - Dr. Laurence Ferrari: [phone]  - Dr. Nicole Kindred: [phone]  In the event of inclement weather, please call our main line at [phone] for an update on the status of any delays or closures.  Dermatology Medication Tips: Please keep the boxes that topical medications come in in order to help keep track of the instructions about where and how to use these. Pharmacies typically print the medication instructions only on the boxes and not directly on the medication tubes.   If your medication is too expensive, please contact our office at [phone] option 4 or send [place] a message through [place].   We are unable to tell what your co-pay for medications will be in advance as this is different depending on your insurance coverage. However, we may be able to find a substitute medication at lower cost or fill out paperwork to get insurance to cover a needed medication.   If a prior authorization is required to get your medication covered by your insurance company, please allow [place] 1-2 business days to  complete this process.  Drug prices often vary depending on where the prescription is filled and some pharmacies may offer cheaper prices.  The website www.goodrx.com contains coupons for medications through different pharmacies. The prices here do not account for what the cost may be with help from insurance (it may be cheaper with your insurance), but the website can give you the price if you did not use any insurance.  - You can print the associated coupon and take it with your prescription to the pharmacy.  - You may also stop by our office during regular business hours and pick up a GoodRx coupon card.  - If you need your prescription sent electronically to a different pharmacy, notify our office through [facility] or by phone at [phone] option 4.  Si Usted Necesita Algo Despus de Su Visita  Tambin puede enviarnos un mensaje a travs de Pharmacist, community. Por lo general respondemos a los mensajes de MyChart en el transcurso de 1 a 2 das hbiles.  Para renovar recetas, por favor pida a su farmacia que se ponga en contacto con nuestra oficina. Harland Dingwall de fax es [place] [phone].  Si tiene un asunto urgente cuando la clnica est cerrada y que no puede esperar hasta el siguiente da hbil, puede llamar/localizar a su doctor(a) al nmero que aparece a continuacin.   Por favor, tenga en cuenta que aunque hacemos todo lo posible para estar disponibles para asuntos urgentes fuera del horario de [place], no estamos disponibles las 24 horas del da, los 7 das de la [place].   Si tiene un problema urgente y no puede comunicarse con nosotros, puede optar por buscar atencin mdica  en el consultorio de su doctor(a), en una clnica privada, en un centro de atencin urgente o en una sala de emergencias.  Si tiene Engineering geologist, por favor llame inmediatamente al 911 o vaya a la sala de emergencias.  Nmeros de bper  - Dr. Nehemiah Massed: [phone]  - Dra. Moye:  [phone]  - Dra. Nicole Kindred: [phone]  En caso de inclemencias del [place], por favor llame a Johnsie Kindred principal al [phone] para una actualizacin sobre el [place] de cualquier retraso o cierre.  Consejos para la medicacin en dermatologa: Por favor, guarde las cajas en las que vienen los medicamentos de uso tpico para ayudarle a seguir las instrucciones sobre dnde y cmo usarlos. Las farmacias generalmente imprimen las instrucciones del medicamento slo en las cajas y no directamente en los tubos del [place].   Si su medicamento es muy caro, por favor, pngase en contacto con Zigmund Daniel llamando al [phone] y presione la opcin 4 o envenos un mensaje a travs de Pharmacist, community.   No podemos decirle cul ser su copago por los medicamentos por adelantado ya que esto es diferente dependiendo de la cobertura de su seguro. Sin embargo, es posible que podamos encontrar un medicamento sustituto a Electrical engineer un formulario para que el seguro cubra el medicamento que se considera necesario.   Si se requiere una autorizacin previa para que su compaa de seguros [place] su medicamento, por favor permtanos de 1 a 2 das hbiles para completar este proceso.  Los precios de los medicamentos varan con frecuencia dependiendo del Environmental consultant de dnde se surte la receta y alguna farmacias pueden ofrecer precios ms baratos.  El sitio web www.goodrx.com tiene cupones para medicamentos de Airline pilot. Los precios aqu no tienen en cuenta lo que podra costar con la ayuda del seguro (puede ser ms barato con su seguro), pero el sitio web puede darle el precio si no utiliz Research scientist (physical sciences).  - Puede imprimir el cupn correspondiente y llevarlo con su receta a la farmacia.  - Tambin puede pasar por nuestra oficina durante el horario de atencin regular y Charity fundraiser una tarjeta de cupones de GoodRx.  - Si necesita que su receta se enve electrnicamente a una farmacia diferente,  informe a nuestra oficina a travs de MyChart de [place] o por telfono llamando al [phone] y presione la opcin 4.

## 2022-07-03 NOTE — Progress Notes (Signed)
   Follow-Up Visit   Subjective  Donald Barry is a 77 y.o. male who presents for the following: Other (Here today for punch bx at mid upper chest. ).   The following portions of the chart were reviewed this encounter and updated as appropriate:  Tobacco  Allergies  Meds  Problems  Med Hx  Surg Hx  Fam Hx      Review of Systems: No other skin or systemic complaints except as noted in HPI or Assessment and Plan.   Objective  Well appearing patient in no apparent distress; mood and affect are within normal limits.  A focused examination was performed including upper mid chest. Relevant physical exam findings are noted in the Assessment and Plan.  upper mid chest 0.4 cm pink papule    Assessment & Plan  Neoplasm of uncertain behavior upper mid chest left  Skin / nail biopsy Type of biopsy: punch   Informed consent: discussed and consent obtained   Timeout: patient name, date of birth, surgical site, and procedure verified   Patient was prepped and draped in usual sterile fashion: Area prepped with isopropyl alcohol. Anesthesia: the lesion was anesthetized in a standard fashion   Anesthetic:  1% lidocaine w/ epinephrine 1-100,000 buffered w/ 8.4% NaHCO3 Punch size:  4 mm Suture size:  4-0 Suture type: Prolene (polypropylene)   Hemostasis achieved with: suture and aluminum chloride   Outcome: patient tolerated procedure well   Post-procedure details: wound care instructions given   Additional details:  Mupirocin and a dressing applied  Upper mid chest right Skin / nail biopsy Type of biopsy: punch   Informed consent: discussed and consent obtained   Timeout: patient name, date of birth, surgical site, and procedure verified   Patient was prepped and draped in usual sterile fashion: Area prepped with isopropyl alcohol. Anesthesia: the lesion was anesthetized in a standard fashion   Anesthetic:  1% lidocaine w/ epinephrine 1-100,000 buffered w/ 8.4% NaHCO3 Punch size:   4 mm Suture size:  4-0 Suture type: Prolene (polypropylene)   Hemostasis achieved with: suture and aluminum chloride   Outcome: patient tolerated procedure well   Post-procedure details: wound care instructions given   Additional details:  Mupirocin and a dressing applied  Specimen 1 - Surgical pathology Differential Diagnosis: r/o granuloma annulare vs sarcoidosis vs infection vs other  Check Margins: No  Punch bx x 2 to r/o granuloma annulare, sarcoidosis, infection, other. One specimen sent for H&E, the other sent for tissue cultures  Will send for mycobacterial, fungal and aerobic and anaerobic culture Mycobacterial culture to be incubated at 30 degrees and 37 degrees     Related Procedures AFB culture with smear Culture, Fungus with Smear Anaerobic/Aerobic/Gram Stain   Return for 2 week suture removal and follow up.  I, Ruthell Rummage, CMA, am acting as scribe for [name], MD.  Documentation: I have reviewed the above documentation for accuracy and completeness, and I agree with the above.  [name], MD

## 2022-07-05 ENCOUNTER — Encounter: Payer: Self-pay | Admitting: Dermatology

## 2022-07-05 ENCOUNTER — Telehealth: Payer: Self-pay | Admitting: Dermatology

## 2022-07-05 NOTE — Telephone Encounter (Addendum)
error 

## 2022-07-07 LAB — GRAM STAIN

## 2022-07-07 LAB — TISSUE CULTURE AEROBE/ANAEROBE

## 2022-07-07 LAB — FUNGUS CULTURE W SMEAR

## 2022-07-09 ENCOUNTER — Telehealth: Payer: Self-pay

## 2022-07-09 NOTE — Telephone Encounter (Addendum)
Called and discussed bx results with patient. Explained we are still awaiting culture results. He denied further questions and verbalized understanding.    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 SEE DESCRIPTION  Biopsy suggests chronic inflammation around hair follicle.  Initial gram stain also shows some rare bacteria which may just be from surface of the skin. We are still awaiting culture results.   No treatment needed for now.   MAs please call. Thank you!

## 2022-07-17 ENCOUNTER — Encounter: Payer: Self-pay | Admitting: Dermatology

## 2022-07-17 ENCOUNTER — Ambulatory Visit: Payer: Medicare HMO | Admitting: Dermatology

## 2022-07-17 VITALS — BP 104/65 | HR 68

## 2022-07-17 DIAGNOSIS — L905 Scar conditions and fibrosis of skin: Secondary | ICD-10-CM | POA: Diagnosis not present

## 2022-07-17 NOTE — Progress Notes (Signed)
   Follow-Up Visit   Subjective  Donald Barry is a 77 y.o. male who presents for the following: suture removal and follow up (Of the upper mid chest right and left - culture results not back yet, but patient states that he received a letter from his insurance that cultures were not covered and [facility] wasn't going to perform the test).  The following portions of the chart were reviewed this encounter and updated as appropriate:   Tobacco  Allergies  Meds  Problems  Med Hx  Surg Hx  Fam Hx      Review of Systems:  No other skin or systemic complaints except as noted in HPI or Assessment and Plan.  Objective  Well appearing patient in no apparent distress; mood and affect are within normal limits.  A focused examination was performed including the face and chest. Relevant physical exam findings are noted in the Assessment and Plan.  upper mid chest right and left Healing punch biopsy site.    Assessment & Plan  Scar upper mid chest right and left  Bx proven - SUPERFICIAL SCAR WITH PERIFOLLICULAR LYMPHOHISTIOCYTIC INFILTRATE SUGGESTIVE OF RESOLVING CHRONIC FOLLICULITIS.    Biopsy suggests chronic inflammation around hair follicle.   Initial gram stain also shows some rare bacteria which may just be from surface of the skin. We are still awaiting culture results.  Patient notes that he received a message that Medicare part B will not cover tissue cultures and he believes they are not running them with [facility]. He will try to find the message and send it to [place] so we can figure out what is happening. As repeat biopsy for H&E was reassuring and the area is small (mostly removed) and there are no other involved sites, may be ok to not run cultures and monitor.    No treatment needed for now.  Patient will find the letter he received from his insurance and try to upload it to [place] for [place] to review. Based on recent pathology results I think we can observe area and patient should  RTC if any changes.  Encounter for Removal of Sutures - Incision site at the upper mid chest right and left is clean, dry and intact - Wound cleansed, sutures removed, wound cleansed and steri strips applied.  - Discussed pathology results showing superficial scar with perifollicular lymphohistiocytic infiltrate suggestive of resolving chronic folliculitis.  - Patient advised to keep steri-strips dry until they fall off. - Scars remodel for a full year. - Once steri-strips fall off, patient can apply over-the-counter silicone scar cream each night to help with scar remodeling if desired. - Patient advised to call with any concerns or if they notice any new or changing lesions.    Return for appointment as scheduled.  Luther Redo, CMA, am acting as scribe for [name], MD .  Documentation: I have reviewed the above documentation for accuracy and completeness, and I agree with the above.  [name], MD

## 2022-07-17 NOTE — Patient Instructions (Signed)
Due to recent changes in healthcare laws, you may see results of your pathology and/or laboratory studies on MyChart before the doctors have had a chance to review them. We understand that in some cases there may be results that are confusing or concerning to you. Please understand that not all results are received at the same time and often the doctors may need to interpret multiple results in order to provide you with the best plan of care or course of treatment. Therefore, we ask that you please give us 2 business days to thoroughly review all your results before contacting the office for clarification. Should we see a critical lab result, you will be contacted sooner.   If You Need Anything After Your Visit  If you have any questions or concerns for your doctor, please call our main line at [phone] and press option 4 to reach your doctor's medical assistant. If no one answers, please leave a voicemail as directed and we will return your call as soon as possible. Messages left after 4 pm will be answered the following business day.   You may also send us a message via MyChart. We typically respond to MyChart messages within 1-2 business days.  For prescription refills, please ask your pharmacy to contact our office. Our fax number is [phone].  If you have an urgent issue when the clinic is closed that cannot wait until the next business day, you can page your doctor at the number below.    Please note that while we do our best to be available for urgent issues outside of office hours, we are not available 24/7.   If you have an urgent issue and are unable to reach us, you may choose to seek medical care at your doctor's office, retail clinic, urgent care center, or emergency room.  If you have a medical emergency, please immediately call 911 or go to the emergency department.  Pager Numbers  - Dr. Kowalski: [phone]  - Dr. Moye: [phone]  - Dr. Stewart:  [phone]  In the event of inclement weather, please call our main line at [phone] for an update on the status of any delays or closures.  Dermatology Medication Tips: Please keep the boxes that topical medications come in in order to help keep track of the instructions about where and how to use these. Pharmacies typically print the medication instructions only on the boxes and not directly on the medication tubes.   If your medication is too expensive, please contact our office at [phone] option 4 or send us a message through MyChart.   We are unable to tell what your co-pay for medications will be in advance as this is different depending on your insurance coverage. However, we may be able to find a substitute medication at lower cost or fill out paperwork to get insurance to cover a needed medication.   If a prior authorization is required to get your medication covered by your insurance company, please allow us 1-2 business days to complete this process.  Drug prices often vary depending on where the prescription is filled and some pharmacies may offer cheaper prices.  The website www.goodrx.com contains coupons for medications through different pharmacies. The prices here do not account for what the cost may be with help from insurance (it may be cheaper with your insurance), but the website can give you the price if you did not use any insurance.  - You can print the associated coupon and take it with   your prescription to the pharmacy.  - You may also stop by our office during regular business hours and pick up a GoodRx coupon card.  - If you need your prescription sent electronically to a different pharmacy, notify our office through [facility] MyChart or by phone at [phone] option 4.     Si Usted Necesita Algo Despus de Su Visita  Tambin puede enviarnos un mensaje a travs de MyChart. Por lo general respondemos a los mensajes de MyChart en el transcurso de 1 a 2  das hbiles.  Para renovar recetas, por favor pida a su farmacia que se ponga en contacto con nuestra oficina. Nuestro nmero de fax es el [phone].  Si tiene un asunto urgente cuando la clnica est cerrada y que no puede esperar hasta el siguiente da hbil, puede llamar/localizar a su doctor(a) al nmero que aparece a continuacin.   Por favor, tenga en cuenta que aunque hacemos todo lo posible para estar disponibles para asuntos urgentes fuera del horario de oficina, no estamos disponibles las 24 horas del da, los 7 das de la semana.   Si tiene un problema urgente y no puede comunicarse con nosotros, puede optar por buscar atencin mdica  en el consultorio de su doctor(a), en una clnica privada, en un centro de atencin urgente o en una sala de emergencias.  Si tiene una emergencia mdica, por favor llame inmediatamente al 911 o vaya a la sala de emergencias.  Nmeros de bper  - Dr. Kowalski: [phone]  - Dra. Moye: [phone]  - Dra. Stewart: [phone]  En caso de inclemencias del tiempo, por favor llame a nuestra lnea principal al [phone] para una actualizacin sobre el estado de cualquier retraso o cierre.  Consejos para la medicacin en dermatologa: Por favor, guarde las cajas en las que vienen los medicamentos de uso tpico para ayudarle a seguir las instrucciones sobre dnde y cmo usarlos. Las farmacias generalmente imprimen las instrucciones del medicamento slo en las cajas y no directamente en los tubos del medicamento.   Si su medicamento es muy caro, por favor, pngase en contacto con nuestra oficina llamando al [phone] y presione la opcin 4 o envenos un mensaje a travs de MyChart.   No podemos decirle cul ser su copago por los medicamentos por adelantado ya que esto es diferente dependiendo de la cobertura de su seguro. Sin embargo, es posible que podamos encontrar un medicamento sustituto a menor costo o llenar un formulario para que el  seguro cubra el medicamento que se considera necesario.   Si se requiere una autorizacin previa para que su compaa de seguros cubra su medicamento, por favor permtanos de 1 a 2 das hbiles para completar este proceso.  Los precios de los medicamentos varan con frecuencia dependiendo del lugar de dnde se surte la receta y alguna farmacias pueden ofrecer precios ms baratos.  El sitio web www.goodrx.com tiene cupones para medicamentos de diferentes farmacias. Los precios aqu no tienen en cuenta lo que podra costar con la ayuda del seguro (puede ser ms barato con su seguro), pero el sitio web puede darle el precio si no utiliz ningn seguro.  - Puede imprimir el cupn correspondiente y llevarlo con su receta a la farmacia.  - Tambin puede pasar por nuestra oficina durante el horario de atencin regular y recoger una tarjeta de cupones de GoodRx.  - Si necesita que su receta se enve electrnicamente a una farmacia diferente, informe a nuestra oficina a travs de MyChart de [place]   o por telfono llamando al [phone] y presione la opcin 4.  

## 2022-07-18 LAB — SPECIMEN STATUS REPORT

## 2022-07-18 LAB — GRAM STAIN: Organism ID, Bacteria: NONE SEEN

## 2022-07-18 LAB — RESULT

## 2022-07-18 LAB — TISSUE CULTURE AEROBE/ANAEROBE

## 2022-07-20 ENCOUNTER — Other Ambulatory Visit: Payer: Self-pay

## 2022-07-20 ENCOUNTER — Emergency Department (HOSPITAL_BASED_OUTPATIENT_CLINIC_OR_DEPARTMENT_OTHER): Payer: Medicare HMO

## 2022-07-20 ENCOUNTER — Emergency Department (HOSPITAL_BASED_OUTPATIENT_CLINIC_OR_DEPARTMENT_OTHER)
Admission: EM | Admit: 2022-07-20 | Discharge: 2022-07-20 | Disposition: A | Discharge status: Home / Self Care | Payer: Medicare HMO | Attending: Emergency Medicine | Admitting: Emergency Medicine

## 2022-07-20 ENCOUNTER — Encounter (HOSPITAL_BASED_OUTPATIENT_CLINIC_OR_DEPARTMENT_OTHER): Payer: Self-pay

## 2022-07-20 DIAGNOSIS — W268XXA Contact with other sharp object(s), not elsewhere classified, initial encounter: Secondary | ICD-10-CM | POA: Insufficient documentation

## 2022-07-20 DIAGNOSIS — Y93G3 Activity, cooking and baking: Secondary | ICD-10-CM | POA: Insufficient documentation

## 2022-07-20 DIAGNOSIS — S6991XA Unspecified injury of right wrist, hand and finger(s), initial encounter: Secondary | ICD-10-CM | POA: Diagnosis present

## 2022-07-20 DIAGNOSIS — Z7901 Long term (current) use of anticoagulants: Secondary | ICD-10-CM | POA: Diagnosis not present

## 2022-07-20 DIAGNOSIS — S61222A Laceration with foreign body of right middle finger without damage to nail, initial encounter: Secondary | ICD-10-CM | POA: Diagnosis not present

## 2022-07-20 MED ORDER — LIDOCAINE HCL (PF) 1 % IJ SOLN
5.0000 mL | Freq: Once | INTRAMUSCULAR | Status: AC
  Administered 2022-07-20: 5 mL
  Filled 2022-07-20: qty 5

## 2022-07-20 NOTE — ED Triage Notes (Signed)
Patient here POV from Home.  Endorses Cutting his Finger while Cooking 1.5 Hours ago. Distal Portion missing from Right Third Digit.    Takes Eliquis. Tetanus is UTD.  NAD noted during Triage. A&Ox4. GCS 15. Ambulatory.

## 2022-07-20 NOTE — Discharge Instructions (Signed)
Please call the hand specialist I have attached here for you to be evaluated regarding recent injury and ER visit.  Please read the laceration attachment I have here with your discharge papers.  If symptoms worsen please return to ER.

## 2022-07-20 NOTE — ED Provider Notes (Signed)
[name] Note   CSN: [id number] Arrival date & time: 07/20/22  1643     History  Chief Complaint  Patient presents with   Finger Injury    ELPHEGE PORRAS is a 77 y.o. male on Donald Barry scented with a right third digit laceration to the distal end of his finger after cutting an hour ago.  Patient was chopping up potatoes when he excellently sliced finger.  Patient's tetanus is within the last 5 years.  Patient states he still sensation distally and has been able to move his hand has not noticed any overlying skin color changes but states his finger still bleeding.  Last tetanus was in April 2021.  Patient denied syncope, pain out of proportion  Home Medications Prior to Admission medications   Medication Sig Start Date End Date Taking? Authorizing Provider  apixaban (Donald Barry) 5 MG TABS tablet Take 1 tablet (5 mg total) by mouth 2 (two) times daily. 02/07/22   Dunn, Areta Haber, PA-C  atorvastatin (LIPITOR) 10 MG tablet Take 1 tablet by mouth daily. 08/06/19   [provider]  Coenzyme Q10 (COQ10) 100 MG CAPS Take 1 capsule by mouth daily.    [provider]  ferrous sulfate 325 (65 FE) MG EC tablet TAKE 1 TABLET BY MOUTH EVERY DAY 05/31/22   Donald Guadeloupe, MD  fluticasone (FLONASE) 50 MCG/ACT nasal spray Place 1 spray into both nostrils daily.    [provider]  guaiFENesin (MUCINEX) 600 MG 12 hr tablet Take by mouth 2 (two) times daily.    [provider]  hydrocortisone 2.5 % cream Apply topically 2 (two) times daily as needed (Rash). For up to 2 weeks as directed 06/11/22   Moye, [place], MD  ketoconazole (NIZORAL) 2 % cream Apply 1 Application topically 2 (two) times daily. 06/11/22 07/23/22  Moye, [place], MD  loratadine (CLARITIN) 10 MG tablet Take 1 tablet by mouth daily.    [provider]  metoprolol tartrate (LOPRESSOR) 25 MG tablet Take 0.5 tablets (12.5 mg total) by mouth 2 (two)  times daily. 06/13/21   Delman Kitten, MD  Multiple Vitamin (MULTIVITAMIN WITH MINERALS) TABS tablet Take 1 tablet by mouth daily. Iron Free    [provider]  mupirocin ointment (BACTROBAN) 2 % Apply 1 Application topically 2 (two) times daily. 06/11/22   Moye, [place], MD  NON FORMULARY Place 1 Dose/kg into the nose daily as needed. Netty pot for sinuses    [provider]  omeprazole (PRILOSEC) 40 MG capsule Take 1 capsule by mouth daily. 10/25/19   [provider]  traZODone (DESYREL) 50 MG tablet Take 50 mg by mouth at bedtime as needed. 12/20/21   [provider]      Allergies    Tape, Wound dressing adhesive, Shellfish allergy, Shellfish-derived products, Methenamine, Onion, Other, and Codeine    Review of Systems   Review of Systems See HPI Physical Exam Updated Vital Signs BP 135/85 (BP Location: Left Arm)   Pulse 79   Temp (!) 96.7 F (35.9 C)   Resp 18   Ht 6' (1.829 m)   Wt 88 kg   SpO2 99%   BMI 26.31 kg/m  Physical Exam Constitutional:      General: He is not in acute distress. Cardiovascular:     Pulses: Normal pulses.     Comments: 2+ bilateral radial pulse with regular rate Musculoskeletal:     Comments: 5 out of 5 bilateral  grip strength No tendon tenderness  Skin:    General: Skin is warm and dry.     Comments: No overlying skin color changes No bony protrusions 1 cm circular superficial amputation of soft tissue at the distal aspect of the right third digit that is actively hemorrhaging  Neurological:     Mental Status: He is alert.     Comments: Sensation intact distally in both hands     ED Results / Procedures / Treatments   Labs (all labs ordered are listed, but only abnormal results are displayed) Labs Reviewed - No data to display  EKG None  Radiology DG Finger Middle Right  Result Date: 07/20/2022 CLINICAL DATA:  Right distal third digit laceration while cooking EXAM: RIGHT MIDDLE FINGER 3V COMPARISON:   None Available. FINDINGS: There is no evidence of fracture or dislocation. Degenerative changes of the partially imaged hand. Soft tissue amputation of the distal middle finger. No radiopaque foreign body the distal finger. Linear 4 mm radiodensity projects along the dorsal aspect of the middle finger proximal phalanx IMPRESSION: 1. Soft tissue amputation of the distal middle finger. No radiopaque foreign body in the distal finger. 2. Linear 4 mm radiodensity projects along the dorsal aspect of the middle finger proximal phalanx and would be amenable to direct inspection. Electronically Signed   By: Donald Barry M.D.   On: 07/20/2022 17:16    Procedures .Marland KitchenLaceration Repair  Date/Time: 07/20/2022 6:30 PM  Performed by: Chuck Hint, PA-C Authorized by: Chuck Hint, PA-C   Consent:    Consent obtained:  Verbal   Consent given by:  Patient   Risks, benefits, and alternatives were discussed: yes     Risks discussed:  Infection, need for additional repair, nerve damage, pain, poor cosmetic result and poor wound healing   Alternatives discussed:  No treatment Universal protocol:    Imaging studies available: yes   Anesthesia:    Anesthesia method:  Nerve block   Block needle gauge:  25 G   Block anesthetic:  Lidocaine 1% w/o epi   Block technique:  Digital block   Block outcome:  Anesthesia achieved Laceration details:    Location:  Finger   Finger location:  R long finger   Length (cm):  1   Depth (mm):  1 Exploration:    Imaging obtained: x-ray     Imaging outcome: foreign body noted   Treatment:    Area cleansed with:  Saline   Amount of cleaning:  Standard   Irrigation solution:  Sterile water   Irrigation volume:  1000 mL   Irrigation method:  Pressure wash   Visualized foreign bodies/material removed: no   Post-procedure details:    Dressing:  Non-adherent dressing and bulky dressing   Procedure completion:  Tolerated Comments:     No sutures could be placed as the  laceration was too superficial and too wide to be closed at the distal aspect of the patient's finger.  A nonadherent dressing was placed with bulky dressing to stop the bleeding.     Medications Ordered in ED Medications  lidocaine (PF) (XYLOCAINE) 1 % injection 5 mL (5 mLs Infiltration Given 07/20/22 1727)    ED Course/ Medical Decision Making/ A&P                             Medical Decision Making Amount and/or Complexity of Data Reviewed Radiology: ordered.   [name] 77 y.o.  presented today for finger laceration. Working DDx that I considered at this time includes, but not limited to, fracture, ischemic limb, osteomyelitis.  Review of prior external notes: None  Unique Tests and My Interpretation:  Right hand x-ray: Soft tissue amputation of right third digit at the distal end, no acute fractures, radiopaque foreign body noted  Discussion with Independent Historian: Daughter  Discussion of Management of Tests: None  Risk: Low:  - based on diagnostic testing/clinical impression and treatment plan  Risk Stratification Score: None  R/o DDx: Ischemic limb: Patient good pulses sensation and motor skill, no overlying skin color changes indicative of ischemic limb Fracture: X-ray negative Osteomyelitis: No bony protrusions that would suspect bone would be exposed  Plan: Patient presented for laceration to right third digit.  On exam patient was stable and was nondistressed.  Patient is on Donald Barry and so the distal end of the finger was actively hemorrhaging.  Laceration was too superficial and wide to be repaired with a suture and so a non adhesive was placed to stop the bleeding and dressing was applied afterwards.  Patient will follow-up with a hand specialist and given instruction on how to care for laceration.  Patient stated he has an allergy to Steri-Strips and not to not adhesives.  At this time patient does not need any antibiotics for this laceration.  Foreign  body was not noted on exam but noted on the x-ray.  Patient's tetanus is within the 5 years and this is a clean wound and so status will not be updated today.  Patient had good pulses, motor, sensation before and after wound repair.  Patient was given return precautions.patient stable for discharge at this time.  Patient verbalized understanding of plan.         Final Clinical Impression(s) / ED Diagnoses Final diagnoses:  Laceration of right middle finger with foreign body without damage to nail, initial encounter    Rx / DC Orders ED Discharge Orders     None         Elvina Sidle 07/20/22 Darryll Capers, MD 07/20/22 2128

## 2022-07-25 ENCOUNTER — Telehealth: Payer: Self-pay

## 2022-07-25 NOTE — Telephone Encounter (Signed)
-----   Message from Alfonso Patten, MD sent at 07/18/2022  5:49 PM EST ----- Bacterial culture results so far not showing any infection.   MAs please call. Thank you!

## 2022-07-25 NOTE — Telephone Encounter (Signed)
Advised pt that the culture results so far are negative./sh

## 2022-08-01 ENCOUNTER — Telehealth: Payer: Self-pay

## 2022-08-01 LAB — FUNGUS CULTURE W SMEAR

## 2022-08-01 NOTE — Telephone Encounter (Signed)
-----   Message from Alfonso Patten, MD sent at 08/01/2022  1:42 PM EDT ----- Fungal culture showed no growth. No treatment needed at this time.  MAs please call. Thank you!

## 2022-08-01 NOTE — Telephone Encounter (Signed)
Patient advised culture showed no growth, no treatment needed. Lurlean Horns., RMA

## 2022-08-02 ENCOUNTER — Inpatient Hospital Stay: Payer: Medicare HMO | Attending: Oncology

## 2022-08-02 DIAGNOSIS — D472 Monoclonal gammopathy: Secondary | ICD-10-CM | POA: Diagnosis not present

## 2022-08-02 DIAGNOSIS — D509 Iron deficiency anemia, unspecified: Secondary | ICD-10-CM | POA: Diagnosis not present

## 2022-08-02 LAB — IRON AND TIBC
Iron: 77 ug/dL (ref 45–182)
Saturation Ratios: 24 % (ref 17.9–39.5)
TIBC: 321 ug/dL (ref 250–450)
UIBC: 244 ug/dL

## 2022-08-02 LAB — CBC WITH DIFFERENTIAL/PLATELET
Abs Immature Granulocytes: 0.02 10*3/uL (ref 0.00–0.07)
Basophils Absolute: 0 10*3/uL (ref 0.0–0.1)
Basophils Relative: 0 %
Eosinophils Absolute: 0.1 10*3/uL (ref 0.0–0.5)
Eosinophils Relative: 1 %
HCT: 42.8 % (ref 39.0–52.0)
Hemoglobin: 14.2 g/dL (ref 13.0–17.0)
Immature Granulocytes: 0 %
Lymphocytes Relative: 16 %
Lymphs Abs: 1.1 10*3/uL (ref 0.7–4.0)
MCH: 31.4 pg (ref 26.0–34.0)
MCHC: 33.2 g/dL (ref 30.0–36.0)
MCV: 94.7 fL (ref 80.0–100.0)
Monocytes Absolute: 0.7 10*3/uL (ref 0.1–1.0)
Monocytes Relative: 10 %
Neutro Abs: 5 10*3/uL (ref 1.7–7.7)
Neutrophils Relative %: 73 %
Platelets: 148 10*3/uL — ABNORMAL LOW (ref 150–400)
RBC: 4.52 MIL/uL (ref 4.22–5.81)
RDW: 13.2 % (ref 11.5–15.5)
WBC: 6.8 10*3/uL (ref 4.0–10.5)
nRBC: 0 % (ref 0.0–0.2)

## 2022-08-02 LAB — FERRITIN: Ferritin: 167 ng/mL (ref 24–336)

## 2022-08-24 LAB — AFB CULTURE WITH SMEAR (NOT AT ARMC)
Acid Fast Culture: NEGATIVE
Acid Fast Smear: NEGATIVE

## 2022-08-24 LAB — SPECIMEN STATUS REPORT

## 2022-08-27 ENCOUNTER — Telehealth: Payer: Self-pay

## 2022-08-27 NOTE — Telephone Encounter (Signed)
Discussed C&S results negative. Observe. Patient voiced understanding.

## 2022-08-27 NOTE — Telephone Encounter (Signed)
-----   Message from Sandi Mealy, MD sent at 08/27/2022 10:54 AM EDT ----- Acid fast bacterial culture negative. No treatment needed. Will monitor site.   MAs please call. Thank you!

## 2022-09-13 ENCOUNTER — Ambulatory Visit: Payer: Medicare HMO | Attending: Cardiovascular Disease | Admitting: Cardiovascular Disease

## 2022-09-13 ENCOUNTER — Encounter: Payer: Self-pay | Admitting: Cardiovascular Disease

## 2022-09-13 VITALS — BP 110/60 | HR 74 | Ht 72.0 in | Wt 199.5 lb

## 2022-09-13 DIAGNOSIS — G4730 Sleep apnea, unspecified: Secondary | ICD-10-CM | POA: Diagnosis not present

## 2022-09-13 DIAGNOSIS — I480 Paroxysmal atrial fibrillation: Secondary | ICD-10-CM | POA: Diagnosis not present

## 2022-09-13 DIAGNOSIS — E785 Hyperlipidemia, unspecified: Secondary | ICD-10-CM | POA: Diagnosis not present

## 2022-09-13 DIAGNOSIS — I700 Atherosclerosis of aorta: Secondary | ICD-10-CM | POA: Diagnosis not present

## 2022-09-13 NOTE — Patient Instructions (Signed)
Medication Instructions:  No changes *If you need a refill on your cardiac medications before your next appointment, please call your pharmacy*   Lab Work: None ordered If you have labs (blood work) drawn today and your tests are completely normal, you will receive your results only by: MyChart Message (if you have MyChart) OR A paper copy in the mail If you have any lab test that is abnormal or we need to change your treatment, we will call you to review the results.   Testing/Procedures: None ordered   Follow-Up: At [facility], you and your health needs are our priority.  As part of our continuing mission to provide you with exceptional heart care, we have created designated Provider Care Teams.  These Care Teams include your primary Cardiologist (physician) and Advanced Practice Providers (APPs -  Physician Assistants and Nurse Practitioners) who all work together to provide you with the care you need, when you need it.  We recommend signing up for the patient portal called "MyChart".  Sign up information is provided on this After Visit Summary.  MyChart is used to connect with patients for Virtual Visits (Telemedicine).  Patients are able to view lab/test results, encounter notes, upcoming appointments, etc.  Non-urgent messages can be sent to your provider as well.   To learn more about what you can do with MyChart, go to [email or web address]://[email or web address].com.    Your next appointment:   12 month(s)  Provider:   You may see Muhammad Arida, MD or one of the following Advanced Practice Providers on your designated Care Team:   Christopher Berge, NP Ryan Dunn, PA-C Cadence Furth, PA-C Sheri Hammock, NP    

## 2022-09-13 NOTE — Progress Notes (Signed)
Cardiology Office Note   Date:  09/13/2022   ID:  [name], [name] 1945-09-27, MRN [id number]  PCP:  Kandyce Rud, MD  Cardiologist:   Lorine Bears, MD   Chief Complaint  Patient presents with   Follow-up    6 month f/u no complaints today. Meds reviewed verbally with pt.      History of Present Illness: Donald Barry is a 77 y.o. male who is here today for follow-up visit regarding paroxysmal atrial fibrillation. 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 He was hospitalized in September 2022 with constipation and  pyelonephritis complicated by sepsis due to Enterobacter.  He had atrial fibrillation with RVR during that admission which was treated with rate control and subsequently converted to sinus rhythm.  He was initially not anticoagulated due to CHA2DS2-VASc score of 1 and other medical issues making him high risk for bleeding. He had an echocardiogram done during that admission which  showed low normal LV systolic function with an EF of 50 to 55%, calcified aortic valve without significant stenosis, normal diastolic function and dilated IVC.  He did have recurrent A-fib with RVR in the setting of having a sleep study done.   He underwent extensive GI work-up for blood loss anemia which has been nonrevealing.    He had recurrent A-fib with RVR in September of 2023 but was noted to be in sinus rhythm in the ED.  He had COVID just before that.  He was subsequently started on Eliquis for anticoagulation has been doing well.  He was hospitalized at [facility] in late December with MSSA bacteremia due to UTI.  He was treated with antibiotics.  He had another admission in January for recurrent infection.  He has been doing well from a cardiac standpoint with no chest pain, shortness of breath or palpitations.  No dizziness or syncope.  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 SCCis   Squamous cell skin cancer 07/2017   Resected from scalp.    Past Surgical History:  Procedure Laterality Date   BACK SURGERY  2008   L4, L5   COLONOSCOPY     cystectomy with prostatectomy ileal conduit     CYSTO WITH HYDRODISTENSION N/A 06/17/2019   Procedure: CYSTOSCOPY/HYDRODISTENSION;  Surgeon: Sondra Come, MD;  Location: [facility] ORS;  Service: Urology;  Laterality: N/A;   CYSTOSCOPY WITH BIOPSY N/A 07/17/2018   Procedure: CYSTOSCOPY WITH BLADDER BIOPSY;  Surgeon: Sondra Come, MD;  Location: [facility] ORS;  Service: Urology;  Laterality: N/A;   CYSTOSCOPY WITH BIOPSY N/A 06/17/2019   Procedure: CYSTOSCOPY WITH BLADDER  BIOPSY;  Surgeon: Sondra Come, MD;  Location: [facility] ORS;  Service: Urology;  Laterality: N/A;   CYSTOSCOPY WITH FULGERATION N/A 07/17/2018   Procedure: CYSTOSCOPY WITH FULGERATION;  Surgeon: Sondra Come, MD;  Location: [facility] ORS;  Service: Urology;  Laterality: N/A;   CYSTOSCOPY WITH FULGERATION N/A 06/17/2019   Procedure: CYSTOSCOPY WITH FULGERATION;  Surgeon: Sondra Come, MD;  Location: [facility] ORS;  Service: Urology;  Laterality: N/A;   ESOPHAGOGASTRODUODENOSCOPY     HERNIA REPAIR  2012   umbilical   STOMACH SURGERY     FUNDIC GLAND POLYP     Current Outpatient Medications   Medication Sig Dispense Refill   apixaban (ELIQUIS) 5 MG TABS tablet Take 1 tablet (5 mg total) by mouth 2 (two) times daily. 60 tablet 11   atorvastatin (LIPITOR) 10 MG tablet Take 1 tablet by mouth daily.     Coenzyme Q10 (COQ10) 100 MG CAPS Take 1 capsule by mouth daily.     ferrous sulfate 325 (65 FE) MG EC tablet TAKE 1 TABLET BY MOUTH EVERY DAY (Patient taking differently: Take 1 tablet by mouth. Monday, Wednesday and Friday.) 90 tablet 1   fluticasone (FLONASE) 50 MCG/ACT nasal spray Place 1 spray into both nostrils daily.     guaiFENesin (MUCINEX) 600 MG 12 hr tablet Take by mouth 2 (two) times daily.     hydrocortisone 2.5 % cream Apply topically 2 (two) times daily as needed (Rash). For up to 2 weeks as directed 20 g 0   loratadine (CLARITIN) 10 MG tablet Take 1 tablet by mouth daily.     metoprolol tartrate (LOPRESSOR) 25 MG tablet Take 0.5 tablets (12.5 mg total) by mouth 2 (two) times daily. 30 tablet 0   Multiple Vitamin (MULTIVITAMIN WITH MINERALS) TABS tablet Take 1 tablet by mouth daily. Iron Free     mupirocin ointment (BACTROBAN) 2 % Apply 1 Application topically 2 (two) times daily. 22 g 0   NON FORMULARY Place 1 Dose/kg into the nose daily as needed. Netty pot for sinuses     omeprazole (PRILOSEC) 40 MG capsule Take 1 capsule by mouth daily.     No current facility-administered medications for this visit.    Allergies:   Tape, Wound dressing adhesive, Shellfish allergy, Shellfish-derived products, Methenamine, Onion, Other, and Codeine    Social History:  The patient  reports that he has never smoked. He has never used smokeless tobacco. He reports that he does not drink alcohol and does not use drugs.   Family History:  The patient's family history includes GER disease in his brother and sister; Heart disease in his father; Hypertension in his mother.    ROS:  Please see the history of present illness.   Otherwise, review of systems are positive for none.   All other  systems are reviewed and negative.    PHYSICAL EXAM: VS:  BP 110/60 (BP Location: Left Arm, Patient Position: Sitting, Cuff Size: Normal)   Ht 6' (1.829 m)   Wt 199 lb 8 oz (90.5 kg)   SpO2 97%   BMI 27.06 kg/m  , BMI Body mass index is 27.06 kg/m. GEN: Well nourished, well developed, in no acute distress  HEENT: normal  Neck: no JVD, carotid bruits, or masses Cardiac: RRR; no murmurs, rubs, or gallops,no edema  Respiratory:  clear to auscultation bilaterally, normal work of breathing GI: soft, nontender, nondistended, + BS MS: no deformity or atrophy  Skin: warm and dry, no rash Neuro:  Strength and sensation are intact Psych: euthymic mood, full  affect   EKG:  EKG is  ordered today. EKG showed normal sinus rhythm with no significant ST or T wave changes.   Recent Labs: 02/05/2022: B Natriuretic Peptide 339.7 02/15/2022: TSH 2.256 03/31/2022: Magnesium 2.0 04/14/2022: ALT 15; BUN 17; Creatinine, Ser 0.88; Potassium 3.9; Sodium 140 08/02/2022: Hemoglobin 14.2; Platelets 148    Lipid Panel No results found for: "CHOL", "TRIG", "HDL", "CHOLHDL", "VLDL", "LDLCALC", "LDLDIRECT"    Wt Readings from Last 3 Encounters:  09/13/22 199 lb 8 oz (90.5 kg)  07/20/22 194 lb (88 kg)  05/03/22 187 lb (84.8 kg)          06/15/2021    8:10 AM  PAD Screen  Previous PAD dx? No  Previous surgical procedure? Yes  Pain with walking? No  Feet/toe relief with dangling? No  Painful, non-healing ulcers? No  Extremities discolored? No      ASSESSMENT AND PLAN:  1.  Paroxysmal atrial fibrillation: He is maintaining in sinus rhythm.  He is tolerating anticoagulation with Eliquis 5 mg twice daily with stable hemoglobin.  No evidence of bleeding at this time.  Continue small dose metoprolol.   The patient describes concerns of bleeding complications with anticoagulation but overall he has tolerated Eliquis well.  I discussed the alternatives with left atrial appendage closure device but I  feel that he is doing well with anticoagulation for now and can be continued.  2.  Sleep apnea, he is now tolerating CPAP very well and reports significant improvement in symptoms.  3.  Hyperlipidemia: Currently on atorvastatin.  I reviewed most recent lipid profile done in March which showed an LDL of 50.  4.  Aortic atherosclerosis: No claudication.    Disposition:   FU in 6 months. Signed,  Lorine Bears, MD  09/13/2022 8:58 AM    [facility]

## 2022-11-04 ENCOUNTER — Inpatient Hospital Stay: Payer: Medicare HMO | Attending: Oncology

## 2022-11-04 ENCOUNTER — Inpatient Hospital Stay (HOSPITAL_BASED_OUTPATIENT_CLINIC_OR_DEPARTMENT_OTHER): Payer: Medicare HMO | Admitting: Internal Medicine

## 2022-11-04 VITALS — BP 125/72 | HR 68 | Temp 98.7°F | Wt 200.4 lb

## 2022-11-04 DIAGNOSIS — R768 Other specified abnormal immunological findings in serum: Secondary | ICD-10-CM | POA: Diagnosis not present

## 2022-11-04 DIAGNOSIS — D472 Monoclonal gammopathy: Secondary | ICD-10-CM | POA: Insufficient documentation

## 2022-11-04 DIAGNOSIS — Z8042 Family history of malignant neoplasm of prostate: Secondary | ICD-10-CM | POA: Insufficient documentation

## 2022-11-04 DIAGNOSIS — Z85828 Personal history of other malignant neoplasm of skin: Secondary | ICD-10-CM | POA: Diagnosis not present

## 2022-11-04 DIAGNOSIS — R0602 Shortness of breath: Secondary | ICD-10-CM | POA: Diagnosis not present

## 2022-11-04 DIAGNOSIS — G8929 Other chronic pain: Secondary | ICD-10-CM | POA: Diagnosis not present

## 2022-11-04 DIAGNOSIS — K760 Fatty (change of) liver, not elsewhere classified: Secondary | ICD-10-CM | POA: Diagnosis not present

## 2022-11-04 DIAGNOSIS — G4730 Sleep apnea, unspecified: Secondary | ICD-10-CM | POA: Insufficient documentation

## 2022-11-04 DIAGNOSIS — Z79899 Other long term (current) drug therapy: Secondary | ICD-10-CM | POA: Diagnosis not present

## 2022-11-04 DIAGNOSIS — Z7901 Long term (current) use of anticoagulants: Secondary | ICD-10-CM | POA: Diagnosis not present

## 2022-11-04 DIAGNOSIS — D508 Other iron deficiency anemias: Secondary | ICD-10-CM | POA: Diagnosis not present

## 2022-11-04 DIAGNOSIS — K219 Gastro-esophageal reflux disease without esophagitis: Secondary | ICD-10-CM | POA: Diagnosis not present

## 2022-11-04 DIAGNOSIS — D509 Iron deficiency anemia, unspecified: Secondary | ICD-10-CM | POA: Insufficient documentation

## 2022-11-04 DIAGNOSIS — Z8051 Family history of malignant neoplasm of kidney: Secondary | ICD-10-CM | POA: Insufficient documentation

## 2022-11-04 LAB — CBC WITH DIFFERENTIAL/PLATELET
Abs Immature Granulocytes: 0.02 10*3/uL (ref 0.00–0.07)
Basophils Absolute: 0 10*3/uL (ref 0.0–0.1)
Basophils Relative: 0 %
Eosinophils Absolute: 0.1 10*3/uL (ref 0.0–0.5)
Eosinophils Relative: 1 %
HCT: 39.7 % (ref 39.0–52.0)
Hemoglobin: 13.6 g/dL (ref 13.0–17.0)
Immature Granulocytes: 0 %
Lymphocytes Relative: 17 %
Lymphs Abs: 1.1 10*3/uL (ref 0.7–4.0)
MCH: 31.8 pg (ref 26.0–34.0)
MCHC: 34.3 g/dL (ref 30.0–36.0)
MCV: 92.8 fL (ref 80.0–100.0)
Monocytes Absolute: 0.5 10*3/uL (ref 0.1–1.0)
Monocytes Relative: 8 %
Neutro Abs: 4.7 10*3/uL (ref 1.7–7.7)
Neutrophils Relative %: 74 %
Platelets: 140 10*3/uL — ABNORMAL LOW (ref 150–400)
RBC: 4.28 MIL/uL (ref 4.22–5.81)
RDW: 13 % (ref 11.5–15.5)
WBC: 6.4 10*3/uL (ref 4.0–10.5)
nRBC: 0 % (ref 0.0–0.2)

## 2022-11-04 LAB — IRON AND TIBC
Iron: 78 ug/dL (ref 45–182)
Saturation Ratios: 26 % (ref 17.9–39.5)
TIBC: 301 ug/dL (ref 250–450)
UIBC: 223 ug/dL

## 2022-11-04 LAB — FERRITIN: Ferritin: 62 ng/mL (ref 24–336)

## 2022-11-05 ENCOUNTER — Encounter: Payer: Self-pay | Admitting: Oncology

## 2022-11-05 LAB — KAPPA/LAMBDA LIGHT CHAINS
Kappa free light chain: 32.8 mg/L — ABNORMAL HIGH (ref 3.3–19.4)
Kappa, lambda light chain ratio: 1.87 — ABNORMAL HIGH (ref 0.26–1.65)
Lambda free light chains: 17.5 mg/L (ref 5.7–26.3)

## 2022-11-05 NOTE — Progress Notes (Signed)
Hematology/Oncology Consult note [facility]  [phone] [phone]  Patient Care Team: Kandyce Rud, MD as PCP - General (Family Medicine) [place] Ouch, MD as PCP - Cardiology (Cardiology) Creig Hines, MD as Consulting Physician (Hematology and Oncology)   Name of the patient: Donald Barry  [id number]  Aug 18, 1945   Date of visit: 11/05/22  Diagnosis-iron deficiency anemia IgM MGUS  Chief complaint/ Reason for visit- routine f/u of anemia and MGUS  Heme/Onc history: patient is a 77 year old male with a past medical history of fatty liver and GERD who was seen by pulmonology clinic GI for symptoms of abdominal pain.  As a part of that work-up patient had SPEP done Which showed mildly elevated IgM levels of 202.  IgM monoclonal kappa light chain was noted on immunofixation 0.2 g.  Hence the patient has been referred to [place].  Of note patient had a normal CBC with a white count of 4.1, H&H of 14.8/43.2 in February 2020.  His platelet counts have always been mildly low in the 120s.  CMP was unremarkable.  Iron studies were normal.  Patient also had CT of the abdomen done as a part of microscopic hematuria in February 2020 which did not reveal any evidence of splenomegaly   Results of blood work from 09/24/2018 were as follows: CBC showed white count of 4.7, H&H of 14.7/43.1.  Count of 147.  CMP was within normal limits.  Multiple myeloma panel showed M protein IgM kappa 0.3 g.  Serum free light chains revealed a mildly elevated kappa free light chain 21.6 with a kappa lambda light chain ratio of 1.86.    Interval history-  Patient was seen today as follow-up for iron deficiency anemia and IgM MGUS. He reports feeling well.  Denies any bleeding in urine or stools.  Energy level is good.  He does report intermittent episodes of just feeling abnormal in his chest associated with shortness of breath the episodes are very transient.  He  feels like he may be going in and out of the A-fib.  Patient was advised to make cardiology aware.  ECOG PS- 1 Pain scale- 0  Review of systems- Review of Systems  Constitutional:  Positive for malaise/fatigue. Negative for chills, fever and weight loss.  HENT:  Negative for congestion, ear discharge and nosebleeds.   Eyes:  Negative for blurred vision.  Respiratory:  Negative for cough, hemoptysis, sputum production, shortness of breath and wheezing.   Cardiovascular:  Negative for chest pain, palpitations, orthopnea and claudication.  Gastrointestinal:  Negative for abdominal pain, blood in stool, constipation, diarrhea, heartburn, melena, nausea and vomiting.  Genitourinary:  Negative for dysuria, flank pain, frequency, hematuria and urgency.  Musculoskeletal:  Negative for back pain, joint pain and myalgias.  Skin:  Negative for rash.  Neurological:  Negative for dizziness, tingling, focal weakness, seizures, weakness and headaches.  Endo/Heme/Allergies:  Does not bruise/bleed easily.  Psychiatric/Behavioral:  Negative for depression and suicidal ideas. The patient does not have insomnia.       Allergies  Allergen Reactions   Tape Dermatitis    Skin blistered- steri strip  Skin blistered   Wound Dressing Adhesive Dermatitis    Skin blistered- steri strip   Shellfish Allergy Diarrhea and Nausea And Vomiting    Shrimp    Shellfish-Derived Products Diarrhea, Nausea And Vomiting and Nausea Only   Methenamine Diarrhea and Nausea And Vomiting   Onion Other (See Comments)    diarrhea Diarrhea- verified  avoids all onion ingredient (including onion powder). MM, RD 12/19/20   Other Nausea And Vomiting    general anesthesia   Codeine Nausea Only     Past Medical History:  Diagnosis Date   A-fib (HCC)    Actinic keratosis    Hx of PDT Tx., face 1/252021 and scalp 07/05/2019   Allergic rhinitis, seasonal 06/14/2014   Allergy    Arthritis    Chronic neck pain 06/14/2014    Complication of anesthesia    Gastroesophageal reflux disease without esophagitis 06/14/2014   Headache    h/o migraines   Hepatic steatosis 04/05/2018   Hiatal hernia 09/09/2017   Intermittent left lower quadrant abdominal pain 07/15/2017   PONV (postoperative nausea and vomiting)    Sleep apnea    cpap   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 SCCis   Squamous cell skin cancer 07/2017   Resected from scalp.     Past Surgical History:  Procedure Laterality Date   BACK SURGERY  2008   L4, L5   COLONOSCOPY     cystectomy with prostatectomy ileal conduit     CYSTO WITH HYDRODISTENSION N/A 06/17/2019   Procedure: CYSTOSCOPY/HYDRODISTENSION;  Surgeon: Sondra Come, MD;  Location: [facility] ORS;  Service: Urology;  Laterality: N/A;   CYSTOSCOPY WITH BIOPSY N/A 07/17/2018   Procedure: CYSTOSCOPY WITH BLADDER BIOPSY;  Surgeon: Sondra Come, MD;  Location: [facility] ORS;  Service: Urology;  Laterality: N/A;   CYSTOSCOPY WITH BIOPSY N/A 06/17/2019   Procedure: CYSTOSCOPY WITH BLADDER  BIOPSY;  Surgeon: Sondra Come, MD;  Location: [facility] ORS;  Service: Urology;  Laterality: N/A;   CYSTOSCOPY WITH FULGERATION N/A 07/17/2018   Procedure: CYSTOSCOPY WITH FULGERATION;  Surgeon: Sondra Come, MD;  Location: [facility] ORS;  Service: Urology;  Laterality: N/A;   CYSTOSCOPY WITH FULGERATION N/A 06/17/2019   Procedure: CYSTOSCOPY WITH FULGERATION;  Surgeon: Sondra Come, MD;  Location: [facility] ORS;  Service: Urology;  Laterality: N/A;   ESOPHAGOGASTRODUODENOSCOPY     HERNIA REPAIR  2012   umbilical   STOMACH SURGERY     FUNDIC GLAND POLYP    Social History   Socioeconomic History   Marital status: Married    Spouse name: Not on file   Number of children: Not on file   Years of education: Not on file   Highest education level: Not on file  Occupational History   Not on file  Tobacco  Use   Smoking status: Never   Smokeless tobacco: Never  Vaping Use   Vaping Use: Never used  Substance and Sexual Activity   Alcohol use: Never   Drug use: Never   Sexual activity: Yes    Birth control/protection: None  Other Topics Concern   Not on file  Social History Narrative   Not on file   Social Determinants of Health   Financial Resource Strain: Not on file  Food Insecurity: No Food Insecurity (03/29/2022)   Hunger Vital Sign    Worried About Running Out of Food in the Last Year: Never true    Ran Out of Food in the Last Year: Never true  Transportation Needs: No Transportation Needs (03/29/2022)   PRAPARE - Administrator, Civil Service (Medical): No    Lack of Transportation (Non-Medical): No  Physical Activity: Not on file  Stress: Not on file  Social Connections: Not on  file  Intimate Partner Violence: Not on file    Family History  Problem Relation Age of Onset   Hypertension Mother    Heart disease Father    GER disease Sister    GER disease Brother    Prostate cancer Neg Hx    Kidney cancer Neg Hx      Current Outpatient Medications:    apixaban (ELIQUIS) 5 MG TABS tablet, Take 1 tablet (5 mg total) by mouth 2 (two) times daily., Disp: 60 tablet, Rfl: 11   atorvastatin (LIPITOR) 10 MG tablet, Take 1 tablet by mouth daily., Disp: , Rfl:    Coenzyme Q10 (COQ10) 100 MG CAPS, Take 1 capsule by mouth daily., Disp: , Rfl:    ferrous sulfate 325 (65 FE) MG EC tablet, TAKE 1 TABLET BY MOUTH EVERY DAY (Patient taking differently: Take 1 tablet by mouth. Monday, Wednesday and Friday.), Disp: 90 tablet, Rfl: 1   fluticasone (FLONASE) 50 MCG/ACT nasal spray, Place 1 spray into both nostrils daily., Disp: , Rfl:    guaiFENesin (MUCINEX) 600 MG 12 hr tablet, Take by mouth 2 (two) times daily., Disp: , Rfl:    loratadine (CLARITIN) 10 MG tablet, Take 1 tablet by mouth daily., Disp: , Rfl:    metoprolol tartrate (LOPRESSOR) 25 MG tablet, Take 0.5 tablets  (12.5 mg total) by mouth 2 (two) times daily., Disp: 30 tablet, Rfl: 0   Multiple Vitamin (MULTIVITAMIN WITH MINERALS) TABS tablet, Take 1 tablet by mouth daily. Iron Free, Disp: , Rfl:    NON FORMULARY, Place 1 Dose/kg into the nose daily as needed. Netty pot for sinuses, Disp: , Rfl:    omeprazole (PRILOSEC) 40 MG capsule, Take 1 capsule by mouth daily., Disp: , Rfl:    hydrocortisone 2.5 % cream, Apply topically 2 (two) times daily as needed (Rash). For up to 2 weeks as directed (Patient not taking: Reported on 11/04/2022), Disp: 20 g, Rfl: 0   mupirocin ointment (BACTROBAN) 2 %, Apply 1 Application topically 2 (two) times daily. (Patient not taking: Reported on 11/04/2022), Disp: 22 g, Rfl: 0  Physical exam:  Vitals:   11/04/22 1458  BP: 125/72  Pulse: 68  Temp: 98.7 F (37.1 C)  TempSrc: Tympanic  SpO2: 96%  Weight: 200 lb 6.4 oz (90.9 kg)   Physical Exam Cardiovascular:     Rate and Rhythm: Normal rate and regular rhythm.     Heart sounds: Normal heart sounds.  Pulmonary:     Effort: Pulmonary effort is normal.     Breath sounds: Normal breath sounds.  Abdominal:     General: Bowel sounds are normal.     Palpations: Abdomen is soft.  Skin:    General: Skin is warm and dry.  Neurological:     Mental Status: He is alert and oriented to person, place, and time.         Latest Ref Rng & Units 04/14/2022    6:19 AM  CMP  Glucose 70 - 99 mg/dL 616   BUN 8 - 23 mg/dL 17   Creatinine 0.73 - 1.24 mg/dL 7.10   Sodium 626 - 948 mmol/L 140   Potassium 3.5 - 5.1 mmol/L 3.9   Chloride 98 - 111 mmol/L 111   CO2 22 - 32 mmol/L 22   Calcium 8.9 - 10.3 mg/dL 8.2       Latest Ref Rng & Units 11/04/2022    2:35 PM  CBC  WBC 4.0 - 10.5 K/uL 6.4   Hemoglobin  13.0 - 17.0 g/dL 16.1   Hematocrit 09.6 - 52.0 % 39.7   Platelets 150 - 400 K/uL 140     No images are attached to the encounter.  No results found.   Assessment and plan- Patient is a 77 y.o. male here for routine  follow-up of iron deficiency anemia and MGUS  Iron deficiency anemia -Hemoglobin normal at 13.6.  Ferritin is 62 and saturation 26%.  Last treated with IV iron infusion in May 2023.  Platelets are low but stable.  Which is chronic.  IgM MGUS SPEP/IFE from July 2023 showed M spike 0.3.  Kappa 40, lambda 20.4 with ratio 1.98.  Repeat labs pending from today. M protein is stable for few years now.  Repeat labs in 1 year.  RTC in 6 months for MD visit with Dr. Smith Robert and labs.   Visit Diagnosis No diagnosis found.

## 2022-11-08 LAB — MULTIPLE MYELOMA PANEL, SERUM
Albumin SerPl Elph-Mcnc: 3.7 g/dL (ref 2.9–4.4)
Albumin/Glob SerPl: 1.5 (ref 0.7–1.7)
Alpha 1: 0.2 g/dL (ref 0.0–0.4)
Alpha2 Glob SerPl Elph-Mcnc: 0.7 g/dL (ref 0.4–1.0)
B-Globulin SerPl Elph-Mcnc: 0.7 g/dL (ref 0.7–1.3)
Gamma Glob SerPl Elph-Mcnc: 0.9 g/dL (ref 0.4–1.8)
Globulin, Total: 2.5 g/dL (ref 2.2–3.9)
IgA: 113 mg/dL (ref 61–437)
IgG (Immunoglobin G), Serum: 803 mg/dL (ref 603–1613)
IgM (Immunoglobulin M), Srm: 286 mg/dL — ABNORMAL HIGH (ref 15–143)
M Protein SerPl Elph-Mcnc: 0.3 g/dL — ABNORMAL HIGH
Total Protein ELP: 6.2 g/dL (ref 6.0–8.5)

## 2022-11-20 ENCOUNTER — Encounter: Payer: Medicare HMO | Admitting: Dermatology

## 2023-01-01 ENCOUNTER — Ambulatory Visit (INDEPENDENT_AMBULATORY_CARE_PROVIDER_SITE_OTHER): Payer: Medicare HMO | Admitting: Dermatology

## 2023-01-01 ENCOUNTER — Encounter: Payer: Self-pay | Admitting: Dermatology

## 2023-01-01 VITALS — BP 115/63 | HR 64

## 2023-01-01 DIAGNOSIS — W908XXA Exposure to other nonionizing radiation, initial encounter: Secondary | ICD-10-CM

## 2023-01-01 DIAGNOSIS — D1801 Hemangioma of skin and subcutaneous tissue: Secondary | ICD-10-CM

## 2023-01-01 DIAGNOSIS — L821 Other seborrheic keratosis: Secondary | ICD-10-CM

## 2023-01-01 DIAGNOSIS — Z1283 Encounter for screening for malignant neoplasm of skin: Secondary | ICD-10-CM

## 2023-01-01 DIAGNOSIS — L570 Actinic keratosis: Secondary | ICD-10-CM | POA: Diagnosis not present

## 2023-01-01 DIAGNOSIS — L578 Other skin changes due to chronic exposure to nonionizing radiation: Secondary | ICD-10-CM

## 2023-01-01 DIAGNOSIS — D229 Melanocytic nevi, unspecified: Secondary | ICD-10-CM

## 2023-01-01 DIAGNOSIS — Z85828 Personal history of other malignant neoplasm of skin: Secondary | ICD-10-CM

## 2023-01-01 DIAGNOSIS — L814 Other melanin hyperpigmentation: Secondary | ICD-10-CM | POA: Diagnosis not present

## 2023-01-01 NOTE — Progress Notes (Signed)
Follow-Up Visit   Subjective  Donald Barry is a 77 y.o. male who presents for the following: Skin Cancer Screening and Full Body Skin Exam. Hx of AK. Hx of SCCs. Spot of concern on left hand at thumb.   The patient presents for Total-Body Skin Exam (TBSE) for skin cancer screening and mole check. The patient has spots, moles and lesions to be evaluated, some may be new or changing and the patient may have concern these could be cancer.    The following portions of the chart were reviewed this encounter and updated as appropriate: medications, allergies, medical history  Review of Systems:  No other skin or systemic complaints except as noted in HPI or Assessment and Plan.  Objective  Well appearing patient in no apparent distress; mood and affect are within normal limits.  A full examination was performed including scalp, head, eyes, ears, nose, lips, neck, chest, axillae, abdomen, back, buttocks, bilateral upper extremities, bilateral lower extremities, hands, feet, fingers, toes, fingernails, and toenails. All findings within normal limits unless otherwise noted below.   Relevant physical exam findings are noted in the Assessment and Plan.  Left Thumb/hand x1, vertex scalp x5, R jaw line x1, L inferior antehelix x1, L nasal ala x1, L eyebrow x1 (10) Erythematous thin papules/macules with gritty scale.     Assessment & Plan   HISTORY OF SQUAMOUS CELL CARCINOMA OF THE SKIN. Right vertex scalp, KA-type, Left vertex scalp, SCCis. - No evidence of recurrence today - No lymphadenopathy - Recommend regular full body skin exams - Recommend daily broad spectrum sunscreen SPF 30+ to sun-exposed areas, reapply every 2 hours as needed.  - Call if any new or changing lesions are noted between office visits     SKIN CANCER SCREENING PERFORMED TODAY.  ACTINIC DAMAGE - Chronic condition, secondary to cumulative UV/sun exposure - diffuse scaly erythematous macules with underlying  dyspigmentation - Recommend daily broad spectrum sunscreen SPF 30+ to sun-exposed areas, reapply every 2 hours as needed.  - Staying in the shade or wearing long sleeves, sun glasses (UVA+UVB protection) and wide brim hats (4-inch brim around the entire circumference of the hat) are also recommended for sun protection.  - Call for new or changing lesions.  LENTIGINES, SEBORRHEIC KERATOSES, HEMANGIOMAS - Benign normal skin lesions - Benign-appearing - Call for any changes  MELANOCYTIC NEVI - Tan-brown and/or pink-flesh-colored symmetric macules and papules - Benign appearing on exam today - Observation - Call clinic for new or changing moles - Recommend daily use of broad spectrum spf 30+ sunscreen to sun-exposed areas.   SEBORRHEIC KERATOSIS - Waxy, tan plaque at left lateral lower leg - Benign-appearing - Discussed benign etiology and prognosis. - Observe - Call for any changes  CHEILITIS Exam Scaly erythematous patches at lips  Treatment Plan Recommend CeraVe moisturizer. Samples given.     AK (actinic keratosis) (10) Left Thumb/hand x1, vertex scalp x5, R jaw line x1, L inferior antehelix x1, L nasal ala x1, L eyebrow x1  Actinic keratoses are precancerous spots that appear secondary to cumulative UV radiation exposure/sun exposure over time. They are chronic with expected duration over 1 year. A portion of actinic keratoses will progress to squamous cell carcinoma of the skin. It is not possible to reliably predict which spots will progress to skin cancer and so treatment is recommended to prevent development of skin cancer.  Recommend daily broad spectrum sunscreen SPF 30+ to sun-exposed areas, reapply every 2 hours as needed.  Recommend staying  in the shade or wearing long sleeves, sun glasses (UVA+UVB protection) and wide brim hats (4-inch brim around the entire circumference of the hat). Call for new or changing lesions.  Destruction of lesion - Left Thumb/hand x1,  vertex scalp x5, R jaw line x1, L inferior antehelix x1, L nasal ala x1, L eyebrow x1 (10)  Destruction method: cryotherapy   Informed consent: discussed and consent obtained   Lesion destroyed using liquid nitrogen: Yes   Region frozen until ice ball extended beyond lesion: Yes   Outcome: patient tolerated procedure well with no complications   Post-procedure details: wound care instructions given   Additional details:  Prior to procedure, discussed risks of blister formation, small wound, skin dyspigmentation, or rare scar following cryotherapy. Recommend Vaseline ointment to treated areas while healing.    Return in about 1 year (around 01/01/2024) for TBSE, HxSCCs, HxAKs.  I, Lawson Radar, CMA, am acting as scribe for Elie Goody, MD.   Documentation: I have reviewed the above documentation for accuracy and completeness, and I agree with the above.  Elie Goody, MD

## 2023-01-01 NOTE — Patient Instructions (Signed)
Cryotherapy Aftercare  Wash gently with soap and water everyday.   Apply Vaseline Jelly daily until healed.    Recommend daily broad spectrum sunscreen SPF 30+ to sun-exposed areas, reapply every 2 hours as needed. Call for new or changing lesions.  Staying in the shade or wearing long sleeves, sun glasses (UVA+UVB protection) and wide brim hats (4-inch brim around the entire circumference of the hat) are also recommended for sun protection.    Melanoma ABCDEs  Melanoma is the most dangerous type of skin cancer, and is the leading cause of death from skin disease.  You are more likely to develop melanoma if you: Have light-colored skin, light-colored eyes, or red or blond hair Spend a lot of time in the sun Tan regularly, either outdoors or in a tanning bed Have had blistering sunburns, especially during childhood Have a close family member who has had a melanoma Have atypical moles or large birthmarks  Early detection of melanoma is key since treatment is typically straightforward and cure rates are extremely high if we catch it early.   The first sign of melanoma is often a change in a mole or a new dark spot.  The ABCDE system is a way of remembering the signs of melanoma.  A for asymmetry:  The two halves do not match. B for border:  The edges of the growth are irregular. C for color:  A mixture of colors are present instead of an even brown color. D for diameter:  Melanomas are usually (but not always) greater than 6mm - the size of a pencil eraser. E for evolution:  The spot keeps changing in size, shape, and color.  Please check your skin once per month between visits. You can use a small mirror in front and a large mirror behind you to keep an eye on the back side or your body.   If you see any new or changing lesions before your next follow-up, please call to schedule a visit.  Please continue daily skin protection including broad spectrum sunscreen SPF 30+ to sun-exposed  areas, reapplying every 2 hours as needed when you're outdoors.   Staying in the shade or wearing long sleeves, sun glasses (UVA+UVB protection) and wide brim hats (4-inch brim around the entire circumference of the hat) are also recommended for sun protection.    Instructions for [facility] Medications  One or more of your medications was sent to the [facility] mail order compounding pharmacy. You will receive an email from them and can purchase the medicine through that link. It will then be mailed to your home at the address you confirmed. If for any reason you do not receive an email from them, please check your spam folder. If you still do not find the email, please let us know. [facility] phone number is [phone].

## 2023-01-05 ENCOUNTER — Emergency Department
Admission: EM | Admit: 2023-01-05 | Discharge: 2023-01-05 | Disposition: A | Discharge status: Home / Self Care | Payer: Medicare HMO | Source: Home / Self Care | Attending: Emergency Medicine | Admitting: Emergency Medicine

## 2023-01-05 ENCOUNTER — Emergency Department: Payer: Medicare HMO

## 2023-01-05 ENCOUNTER — Encounter: Payer: Self-pay | Admitting: Emergency Medicine

## 2023-01-05 ENCOUNTER — Other Ambulatory Visit: Payer: Self-pay

## 2023-01-05 DIAGNOSIS — R0789 Other chest pain: Secondary | ICD-10-CM | POA: Diagnosis present

## 2023-01-05 DIAGNOSIS — Z7901 Long term (current) use of anticoagulants: Secondary | ICD-10-CM | POA: Diagnosis not present

## 2023-01-05 DIAGNOSIS — I480 Paroxysmal atrial fibrillation: Secondary | ICD-10-CM | POA: Insufficient documentation

## 2023-01-05 DIAGNOSIS — R791 Abnormal coagulation profile: Secondary | ICD-10-CM | POA: Diagnosis not present

## 2023-01-05 DIAGNOSIS — R8290 Unspecified abnormal findings in urine: Secondary | ICD-10-CM | POA: Insufficient documentation

## 2023-01-05 DIAGNOSIS — R946 Abnormal results of thyroid function studies: Secondary | ICD-10-CM | POA: Insufficient documentation

## 2023-01-05 DIAGNOSIS — Z87440 Personal history of urinary (tract) infections: Secondary | ICD-10-CM | POA: Insufficient documentation

## 2023-01-05 DIAGNOSIS — R079 Chest pain, unspecified: Secondary | ICD-10-CM

## 2023-01-05 LAB — CBC WITH DIFFERENTIAL/PLATELET
Abs Immature Granulocytes: 0.01 10*3/uL (ref 0.00–0.07)
Basophils Absolute: 0 10*3/uL (ref 0.0–0.1)
Basophils Relative: 0 %
Eosinophils Absolute: 0.1 10*3/uL (ref 0.0–0.5)
Eosinophils Relative: 1 %
HCT: 44.1 % (ref 39.0–52.0)
Hemoglobin: 15.1 g/dL (ref 13.0–17.0)
Immature Granulocytes: 0 %
Lymphocytes Relative: 18 %
Lymphs Abs: 1 10*3/uL (ref 0.7–4.0)
MCH: 32.1 pg (ref 26.0–34.0)
MCHC: 34.2 g/dL (ref 30.0–36.0)
MCV: 93.6 fL (ref 80.0–100.0)
Monocytes Absolute: 0.5 10*3/uL (ref 0.1–1.0)
Monocytes Relative: 9 %
Neutro Abs: 4 10*3/uL (ref 1.7–7.7)
Neutrophils Relative %: 72 %
Platelets: 140 10*3/uL — ABNORMAL LOW (ref 150–400)
RBC: 4.71 MIL/uL (ref 4.22–5.81)
RDW: 13.2 % (ref 11.5–15.5)
WBC: 5.6 10*3/uL (ref 4.0–10.5)
nRBC: 0 % (ref 0.0–0.2)

## 2023-01-05 LAB — COMPREHENSIVE METABOLIC PANEL
ALT: 22 U/L (ref 0–44)
AST: 28 U/L (ref 15–41)
Albumin: 3.9 g/dL (ref 3.5–5.0)
Alkaline Phosphatase: 28 U/L — ABNORMAL LOW (ref 38–126)
Anion gap: 5 (ref 5–15)
BUN: 11 mg/dL (ref 8–23)
CO2: 25 mmol/L (ref 22–32)
Calcium: 8.5 mg/dL — ABNORMAL LOW (ref 8.9–10.3)
Chloride: 107 mmol/L (ref 98–111)
Creatinine, Ser: 0.89 mg/dL (ref 0.61–1.24)
GFR, Estimated: >60 mL/min (ref >60)
Glucose, Bld: 99 mg/dL (ref 70–99)
Potassium: 3.9 mmol/L (ref 3.5–5.1)
Sodium: 137 mmol/L (ref 135–145)
Total Bilirubin: 0.8 mg/dL (ref 0.3–1.2)
Total Protein: 6.9 g/dL (ref 6.5–8.1)

## 2023-01-05 LAB — URINALYSIS, W/ REFLEX TO CULTURE (INFECTION SUSPECTED)
Bilirubin Urine: NEGATIVE
Glucose, UA: NEGATIVE mg/dL
Hgb urine dipstick: NEGATIVE
Ketones, ur: NEGATIVE mg/dL
Nitrite: NEGATIVE
Protein, ur: NEGATIVE mg/dL
Specific Gravity, Urine: 1.009 (ref 1.005–1.030)
Squamous Epithelial / HPF: NONE SEEN /HPF (ref 0–5)
pH: 7 (ref 5.0–8.0)

## 2023-01-05 LAB — MAGNESIUM: Magnesium: 2.1 mg/dL (ref 1.7–2.4)

## 2023-01-05 LAB — TSH: TSH: 4.689 u[IU]/mL — ABNORMAL HIGH (ref 0.350–4.500)

## 2023-01-05 LAB — LIPASE, BLOOD: Lipase: 48 U/L (ref 11–51)

## 2023-01-05 LAB — T4, FREE: Free T4: 0.72 ng/dL (ref 0.61–1.12)

## 2023-01-05 LAB — TROPONIN I (HIGH SENSITIVITY)
Troponin I (High Sensitivity): 5 ng/L (ref <18)
Troponin I (High Sensitivity): 6 ng/L (ref <18)

## 2023-01-05 LAB — D-DIMER, QUANTITATIVE: D-Dimer, Quant: 0.63 ug{FEU}/mL — ABNORMAL HIGH (ref 0.00–0.50)

## 2023-01-05 MED ORDER — IOHEXOL 350 MG/ML SOLN
75.0000 mL | Freq: Once | INTRAVENOUS | Status: AC | PRN
  Administered 2023-01-05: 75 mL via INTRAVENOUS

## 2023-01-05 MED ORDER — ACETAMINOPHEN 325 MG PO TABS
650.0000 mg | ORAL_TABLET | Freq: Once | ORAL | Status: AC
  Administered 2023-01-05: 650 mg via ORAL
  Filled 2023-01-05: qty 2

## 2023-01-05 NOTE — Discharge Instructions (Signed)
You were seen in the emergency department today for your atrial fibrillation and chest pain.  Your testing was fortunately reassuring.  You are still in A-fib currently, but your rate is controlled.  Follow-up with your cardiologist for further evaluation.  Continue taking your medications as prescribed until then.  Return to the ER for new or worsening symptoms.

## 2023-01-05 NOTE — ED Triage Notes (Signed)
Patient to ED via [facility] for left chest pain- states he woke up in afib. Took 1 po metoprolol at 0615 as prescribed by his MD with no relief.

## 2023-01-05 NOTE — ED Provider Notes (Signed)
[facility] Provider Note    Event Date/Time   First MD Initiated Contact with Patient 01/05/23 [phone]     (approximate)   History   Chest Pain   HPI  Donald Barry is a 77 y.o. 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  Patient reports that this morning around 6 AM he woke up and felt an abnormal heartbeat typical of his A-fib.  He additionally reports a mild pressure in the center of his chest.  States that this is not typical for his episodes of A-fib.  He took a pill of metoprolol, but did not notice any change after several hours.  Did review his data from his watch which shows heart rates ranging from the 70s to 99 with suspected atrial fibrillation.  Reports he has been compliant with his Eliquis.  No fevers or chills.  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       Physical Exam   Triage Vital Signs: ED Triage Vitals  Encounter Vitals Group     BP 01/05/23 0915 129/85     Systolic BP Percentile --      Diastolic BP Percentile --      Pulse Rate 01/05/23 0915 71     Resp 01/05/23 0915 18     Temp 01/05/23 0915 98.4 F (36.9 C)     Temp Source 01/05/23 0915 Oral     SpO2 01/05/23 0915 100 %     Weight 01/05/23 0912 194 lb (88 kg)     Height 01/05/23 0912 6' (1.829 m)     Head Circumference --      Peak Flow --      Pain Score 01/05/23 0911 1     Pain Loc --      Pain Education --      Exclude from Growth Chart --     Most recent vital signs: Vitals:   01/05/23 1015 01/05/23 1100  BP:  102/65  Pulse: 68 (!) 26  Resp: 13 15  Temp:    SpO2: 100% 98%     General: Awake, interactive  CV:  Regular rate, good peripheral perfusion.  Resp:  Lungs clear, unlabored respirations.  Abd:  Soft, nondistended, minimal tenderness over the lower abdomen without rebound or guarding, remainder of abdomen  nontender Neuro:  Symmetric facial movement, fluid speech   ED Results / Procedures / Treatments   Labs (all labs ordered are listed, but only abnormal results are displayed) Labs Reviewed  CBC WITH DIFFERENTIAL/PLATELET - Abnormal; Notable for the following components:      Result Value   Platelets 140 (*)    All other components within normal limits  COMPREHENSIVE METABOLIC PANEL - Abnormal; Notable for the following components:   Calcium 8.5 (*)    Alkaline Phosphatase 28 (*)    All other components within normal limits  D-DIMER, QUANTITATIVE - Abnormal; Notable for the following components:   D-Dimer, Quant 0.63 (*)    All other components within normal limits  TSH - Abnormal; Notable for the following components:   TSH 4.689 (*)    All other components within normal limits  URINALYSIS, W/ REFLEX TO CULTURE (INFECTION SUSPECTED) - Abnormal; Notable for the following components:   Color, Urine YELLOW (*)    APPearance CLEAR (*)    Leukocytes,Ua MODERATE (*)    Bacteria,  UA MANY (*)    All other components within normal limits  URINE CULTURE  LIPASE, BLOOD  MAGNESIUM  T4, FREE  TROPONIN I (HIGH SENSITIVITY)  TROPONIN I (HIGH SENSITIVITY)     EKG EKG independently reviewed interpreted by myself (ER attending) demonstrates:  EKG demonstrates A-fib at a rate of 75, QRS 83, QTc 415, no acute ST changes  RADIOLOGY Imaging independently reviewed and interpreted by myself demonstrates:  CXR without focal consolidation CT of the chest without PE  PROCEDURES:  Critical Care performed: No  Procedures   MEDICATIONS ORDERED IN ED: Medications  iohexol (OMNIPAQUE) 350 MG/ML injection 75 mL (75 mLs Intravenous Contrast Given 01/05/23 1046)  acetaminophen (TYLENOL) tablet 650 mg (650 mg Oral Given 01/05/23 1111)     IMPRESSION / MDM / ASSESSMENT AND PLAN / ED COURSE  I reviewed the triage vital signs and the nursing notes.  Differential diagnosis includes, but is not  limited to, arrhythmia, anemia, electrolyte abnormality, pulmonary embolism, ACS, acute intra-abdominal process, UTI  Patient's presentation is most consistent with acute presentation with potential threat to life or bodily function.  77 year old male presenting with rate controlled A-fib with history of the same.  Does report chest pain which is not typical for him as well as some new lower abdominal pain.  Will obtain labs, EKG, x-Terika Pillard, possible CT imaging pending lab results.  D-dimer did return elevated at 0.63.  TSH elevated, but normal free T4.  CT of the chest without evidence of PE.  Urinalysis somewhat questionable for infection, but actually improved from multiple priors for the patient, otherwise without symptoms of a UTI, the setting of his urostomy suspect likely colonization we will hold off on empiric treatment.  Patient reevaluated.  Chest pain-free.  Heart rate remains controlled in A-fib.  Already on anticoagulation.  Has a cardiologist with whom he can follow-up with.  Will place referral.  Strict return precautions provided.  Patient discharged in stable addition.    FINAL CLINICAL IMPRESSION(S) / ED DIAGNOSES   Final diagnoses:  Paroxysmal atrial fibrillation (HCC)  Nonspecific chest pain     Rx / DC Orders   ED Discharge Orders          Ordered    Ambulatory referral to Cardiology       Comments: If you have not heard from the Cardiology office within the next 72 hours please call [phone].   01/05/23 1220             Note:  This document was prepared using Dragon voice recognition software and may include unintentional dictation errors.   Trinna Post, MD 01/05/23 [phone]

## 2023-01-07 LAB — URINE CULTURE: Culture: >=100000 — AB

## 2023-01-08 NOTE — Progress Notes (Signed)
ED Antimicrobial Stewardship Positive Culture Follow Up   Donald Barry is an 77 y.o. male who presented to [facility] on 01/05/2023 with a chief complaint of  Chief Complaint  Patient presents with   Chest Pain    Recent Results (from the past 720 hour(s))  Urine Culture     Status: Abnormal   Collection Time: 01/05/23 11:00 AM   Specimen: Urine, Random  Result Value Ref Range Status   Specimen Description   Final    URINE, RANDOM Performed at [facility], [address] [place], [place] [address]    Special Requests   Final    NONE Reflexed from [id number] Performed at [facility], [address] [place], [place] [address]    Culture >=100,000 COLONIES/mL KLEBSIELLA PNEUMONIAE (A)  Final   Report Status 01/07/2023 FINAL  Final   Organism ID, Bacteria KLEBSIELLA PNEUMONIAE (A)  Final      Susceptibility   Klebsiella pneumoniae - MIC*    AMPICILLIN >=32 RESISTANT Resistant     CEFAZOLIN <=4 SENSITIVE Sensitive     CEFEPIME <=0.12 SENSITIVE Sensitive     CEFTRIAXONE <=0.25 SENSITIVE Sensitive     CIPROFLOXACIN 1 RESISTANT Resistant     GENTAMICIN <=1 SENSITIVE Sensitive     IMIPENEM <=0.25 SENSITIVE Sensitive     NITROFURANTOIN 256 RESISTANT Resistant     TRIMETH/SULFA 160 RESISTANT Resistant     AMPICILLIN/SULBACTAM 8 SENSITIVE Sensitive     PIP/TAZO 8 SENSITIVE Sensitive     * >=100,000 COLONIES/mL KLEBSIELLA PNEUMONIAE   Patient initially presented with no urinary symptoms.Called patient on 01/08/23 and confirmed no new urinary symptoms. Culture result is likely a sign of colonization. Will not recommend antibiotic treatment at this time.  ED Provider: Claudell Kyle, MD  Thank you for involving pharmacy in this patient's care.   Rockwell Alexandria, PharmD Clinical Pharmacist 01/08/2023 1:40 PM

## 2023-01-21 NOTE — Progress Notes (Unsigned)
Cardiology Office Note:  .   Date:  01/22/2023  ID:  Donald Barry, DOB 03-29-1946, MRN [id number] PCP: Kandyce Rud, MD  [facility] Providers Cardiologist:  Lorine Bears, MD    History of Present Illness: .   Donald Barry is a 77 y.o. 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  Hospitalized in September 2022 with pyelonephritis complicated by sepsis due to Enterobacter.  He was found to be in atrial fibrillation with RVR during that admission and was treated with rate control and subsequently converted to sinus rhythm.  He was initially not anticoagulated due to CHA2DS2-VASc score of 1 and other medical issues making him at risk for bleeding.  Echocardiogram revealed LVEF 50-55%, calcified aortic valve without significant stenosis, normal diastolic function and dilated IVC.  He did have recurrent A-fib with RVR in the setting of having a sleep study done.  He underwent extensive GI workup for blood loss anemia which had been nonrevealing.  Recurrent A-fib RVR in September 2023 but was noted to be in sinus rhythm in the ED.  He had also recently been diagnosed with COVID infection.  He was subsequently started on Eliquis for stroke prevention.  He was hospitalized again at [facility] in late December for MSSA bacteremia due to UTI.  He was treated with antibiotics and unfortunately had another admission in January for recurrent infection.  He was last seen in clinic 09/13/2022 was doing well from a cardiac perspective with no chest pain, shortness of breath, or palpitations.  He was continued on apixaban 5 mg daily with conversation that was had about Watchman no other medication changes were made and no further testing was ordered at that time.  He presented to the emergency department on 01/05/2023 with  complaint of chest pain he states it woke him up and he was in atrial fibrillation.  He stated that he took 1 metoprolol with no relief.  He stated that he had a mild pressure in the center of his chest and states that it was not typical for his episodes of atrial fibrillation.  Initial vitals reveal blood pressure 129/85, pulse 71, respirations 18, temperature 98.4.  Pertinent lab send revealed a D-dimer of 0.63, TSH 4.689, calcium 8.5, platelets of 140.  CT of the chest without evidence of PE.  Urinalysis was somewhat questionable for infection but actually improved from multiple priors for the patient otherwise without symptoms of UTI in the setting of urostomy suspect likely colonization so he was not started on empiric treatment with antibiotics.  He was chest pain-free heart rate remained in rate controlled atrial fibrillation he was already on anticoagulation.  He was able to be discharged from the hospital with close cardiology follow-up.  He returns to clinic today after recent visit to the emergency department in August.  He states that he has had a chest pressure off and on for the past month and that is left upper chest without radiation or associated symptoms.  He states that he occasionally has had a with his atrophia of episodes and often without.  He states that it does happen with rest or exertion.  He is also noted as of late and increased amount of swelling to his left lower extremity for the last 4 to 5 days.  States that he has been compliant with his current medication  regimen and has not noted any bleeding with blood noted in his urine or stool due to his apixaban.  ROS: 10 point review of systems has been reviewed and considered negative with exception of what is been listed in the HPI  Studies Reviewed: Marland Kitchen   EKG Interpretation Date/Time:  Wednesday January 22 2023 08:28:58 EDT Ventricular Rate:  66 PR Interval:  172 QRS Duration:  88 QT Interval:  398 QTC Calculation: 417 R  Axis:   17  Text Interpretation: Normal sinus rhythm Normal ECG When compared with ECG of 05-Jan-2023 09:16, PREVIOUS ECG IS PRESENT Confirmed by Charlsie Quest ([id number]) on 01/22/2023 8:39:21 AM   TTE 01/12/21 1. Left ventricular ejection fraction, by estimation, is 50 to 55%. The  left ventricle has low normal function. The left ventricle demonstrates  global hypokinesis. Left ventricular diastolic parameters are consistent  with Grade III diastolic dysfunction   (restrictive).   2. Right ventricular systolic function is normal. The right ventricular  size is normal.   3. 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 No  evidence of mitral stenosis.   4. The aortic valve is normal in structure. Aortic valve regurgitation is  mild. Mild aortic valve sclerosis is present, with no evidence of aortic  valve stenosis.   5. The inferior vena cava is normal in size with greater than 50%  respiratory variability, suggesting right atrial pressure of 3 mmHg.   Risk Assessment/Calculations:    CHA2DS2-VASc Score = 2   This indicates a 2.2% annual risk of stroke. The patient's score is based upon: CHF History: 0 HTN History: 0 Diabetes History: 0 Stroke History: 0 Vascular Disease History: 0 Age Score: 2 Gender Score: 0            Physical Exam:   VS:  BP 116/66 (BP Location: Left Arm, Patient Position: Sitting, Cuff Size: Normal)   Pulse 66   Ht 6' (1.829 m)   Wt 202 lb 6.4 oz (91.8 kg)   SpO2 97%   BMI 27.45 kg/m    Wt Readings from Last 3 Encounters:  01/22/23 202 lb 6.4 oz (91.8 kg)  01/05/23 194 lb (88 kg)  11/04/22 200 lb 6.4 oz (90.9 kg)    GEN: Well nourished, well developed in no acute distress NECK: No JVD; No carotid bruits CARDIAC: RRR, no murmurs, rubs, gallops RESPIRATORY:  Clear to auscultation without rales, wheezing or rhonchi  ABDOMEN: Soft, non-tender, non-distended EXTREMITIES:  No edema; No deformity   ASSESSMENT AND PLAN: .    Atypical chest pain that has been off and on for the last month.  Patient states that he originally noted chest discomfort with his last bout of atrial fibrillation but now it is more often with the same intensity.  He has found no aggravating or alleviating factors.  Recent emergency department visit was reassuring with negative troponins and EKG today revealed sinus rhythm with a rate of 66 with no ischemic changes noted today.  The last time he had had any ischemic workup completed he stated was greater than 20 years ago.  He is being scheduled for [facility] for his chest discomfort off and on for the past month.  Paroxysmal atrial fibrillation with a normal sinus rhythm today.  He is continued on apixaban 5 mg twice daily for CHA2DS2-VASc score of 2 for stroke prophylaxis.  He has also maintained on metoprolol tartrate 12.5 mg twice daily.  He said no concern of bleeding complications with anticoagulation his  last CBC revealed a stable hemoglobin.  Hyperlipidemia with an LDL of 76 which was his most recent results.  He is continued on atorvastatin 10 mg daily.  This continues to be monitored by his PCP.  Obstructive sleep apnea where he states he is compliant and tolerating his CPAP well.    Informed Consent   Shared Decision Making/Informed Consent The risks [chest pain, shortness of breath, cardiac arrhythmias, dizziness, blood pressure fluctuations, myocardial infarction, stroke/transient ischemic attack, nausea, vomiting, allergic reaction, radiation exposure, metallic taste sensation and life-threatening complications (estimated to be 1 in 10,000)], benefits (risk stratification, diagnosing coronary artery disease, treatment guidance) and alternatives of a nuclear stress test were discussed in detail with Mr. Westmeyer and he agrees to proceed.     Dispo: Patient to return to clinic to see MD/APP after testing is completed  Signed, Abbie Berling, NP

## 2023-01-22 ENCOUNTER — Encounter: Payer: Self-pay | Admitting: Cardiology

## 2023-01-22 ENCOUNTER — Ambulatory Visit: Payer: Medicare HMO | Attending: Cardiology | Admitting: Cardiology

## 2023-01-22 VITALS — BP 116/66 | HR 66 | Ht 72.0 in | Wt 202.4 lb

## 2023-01-22 DIAGNOSIS — E785 Hyperlipidemia, unspecified: Secondary | ICD-10-CM | POA: Diagnosis not present

## 2023-01-22 DIAGNOSIS — I480 Paroxysmal atrial fibrillation: Secondary | ICD-10-CM

## 2023-01-22 DIAGNOSIS — G4730 Sleep apnea, unspecified: Secondary | ICD-10-CM | POA: Diagnosis not present

## 2023-01-22 DIAGNOSIS — R079 Chest pain, unspecified: Secondary | ICD-10-CM

## 2023-01-22 NOTE — Patient Instructions (Signed)
Medication Instructions:  No changes *If you need a refill on your cardiac medications before your next appointment, please call your pharmacy*   Lab Work: None ordered If you have labs (blood work) drawn today and your tests are completely normal, you will receive your results only by: MyChart Message (if you have MyChart) OR A paper copy in the mail If you have any lab test that is abnormal or we need to change your treatment, we will call you to review the results.   Testing/Procedures: Your provider has ordered a Lexiscan Myoview Stress test. This will take place at [facility]. Please report to the [facility] medical mall entrance. The volunteers at the first desk will direct you where to go.  [facility] MYOVIEW  Your provider has ordered a Stress Test with nuclear imaging. The purpose of this test is to evaluate the blood supply to your heart muscle. This procedure is referred to as a "Non-Invasive Stress Test." This is because other than having an IV started in your vein, nothing is inserted or "invades" your body. Cardiac stress tests are done to find areas of poor blood flow to the heart by determining the extent of coronary artery disease (CAD). Some patients exercise on a treadmill, which naturally increases the blood flow to your heart, while others who are unable to walk on a treadmill due to physical limitations will have a pharmacologic/chemical stress agent called Lexiscan . This medicine will mimic walking on a treadmill by temporarily increasing your coronary blood flow.   Please note: these test may take anywhere between 2-4 hours to complete  How to prepare for your Myoview test:  Nothing to eat for 6 hours prior to the test No caffeine for 24 hours prior to test No smoking 24 hours prior to test. Your medication may be taken with water.  If your doctor stopped a medication because of this test, do not take that medication. Ladies, please do not wear dresses.  Skirts or pants are  appropriate. Please wear a short sleeve shirt. No perfume, cologne or lotion. Wear comfortable walking shoes. No heels!   PLEASE NOTIFY THE OFFICE AT LEAST 24 HOURS IN ADVANCE IF YOU ARE UNABLE TO KEEP YOUR APPOINTMENT.  [phone] AND  PLEASE NOTIFY NUCLEAR MEDICINE AT [facility] AT LEAST 24 HOURS IN ADVANCE IF YOU ARE UNABLE TO KEEP YOUR APPOINTMENT. [phone]    Follow-Up: At [facility], you and your health needs are our priority.  As part of our continuing mission to provide you with exceptional heart care, we have created designated Provider Care Teams.  These Care Teams include your primary Cardiologist (physician) and Advanced Practice Providers (APPs -  Physician Assistants and Nurse Practitioners) who all work together to provide you with the care you need, when you need it.  We recommend signing up for the patient portal called "MyChart".  Sign up information is provided on this After Visit Summary.  MyChart is used to connect with patients for Virtual Visits (Telemedicine).  Patients are able to view lab/test results, encounter notes, upcoming appointments, etc.  Non-urgent messages can be sent to your provider as well.   To learn more about what you can do with MyChart, go to [email or web address].    Your next appointment:   Follow up after testing with Dr. Kirke Corin or Charlsie Quest

## 2023-01-29 ENCOUNTER — Telehealth: Payer: Self-pay | Admitting: Cardiovascular Disease

## 2023-01-29 NOTE — Telephone Encounter (Signed)
Pt c/o medication issue:  1. Name of Medication:   Nortriptyline, 10 mg  2. How are you currently taking this medication (dosage and times per day)? 1 tablet at bedtime  3. Are you having a reaction (difficulty breathing--STAT)?   No  4. What is your medication issue?   Patient stated he has been taking this medication since 9/12 and wants to know if it will affect his taking his NM MYO MULT SPECT W/WALL test tomorrow.

## 2023-01-29 NOTE — Telephone Encounter (Signed)
Called and spoke with patient. Informed him that the Nortriptyline with not effect his stress test. Patient verbalizes understanding.

## 2023-01-30 ENCOUNTER — Encounter
Admission: RE | Admit: 2023-01-30 | Discharge: 2023-01-30 | Disposition: A | Discharge status: Home / Self Care | Payer: Medicare HMO | Source: Ambulatory Visit | Attending: Cardiology | Admitting: Cardiology

## 2023-01-30 DIAGNOSIS — R079 Chest pain, unspecified: Secondary | ICD-10-CM | POA: Insufficient documentation

## 2023-01-30 MED ORDER — REGADENOSON 0.4 MG/5ML IV SOLN
0.4000 mg | Freq: Once | INTRAVENOUS | Status: AC
  Administered 2023-01-30: 0.4 mg via INTRAVENOUS
  Filled 2023-01-30: qty 5

## 2023-01-30 MED ORDER — TECHNETIUM TC 99M TETROFOSMIN IV KIT
30.0000 | PACK | Freq: Once | INTRAVENOUS | Status: AC
  Administered 2023-01-30: 28.24 via INTRAVENOUS

## 2023-01-30 MED ORDER — TECHNETIUM TC 99M TETROFOSMIN IV KIT
10.0000 | PACK | Freq: Once | INTRAVENOUS | Status: AC
  Administered 2023-01-30: 9.9 via INTRAVENOUS

## 2023-01-31 LAB — NM MYOCAR MULTI W/SPECT W/WALL MOTION / EF
Estimated workload: 1
Exercise duration (min): 0 min
Exercise duration (sec): 0 s
LV dias vol: 104 mL (ref 62–150)
LV sys vol: 37 mL
MPHR: 144 {beats}/min
Nuc Stress EF: 64 %
Peak HR: 98 {beats}/min
Percent HR: 68 %
Rest HR: 57 {beats}/min
Rest Nuclear Isotope Dose: 9.9 mCi
SDS: 0
SRS: 9
SSS: 7
ST Depression (mm): 0 mm
Stress Nuclear Isotope Dose: 28.2 mCi
TID: 0.89

## 2023-01-31 NOTE — Progress Notes (Signed)
Stress testing revealed normal wall motion with estimated function at 58%.  There was no ischemia at peak stress were noted in recovery.  No significant coronary artery or aortic calcification noted on imaging.  This is considered a low risk scan.  Overall reassuring study.

## 2023-02-01 ENCOUNTER — Other Ambulatory Visit: Payer: Self-pay | Admitting: Physician Assistant

## 2023-02-03 NOTE — Telephone Encounter (Signed)
Prescription refill request for Eliquis received. Indication:afib Last office visit:9/24 Scr:1.1  9/24 Age: 77 Weight:91.8  kg  Prescription refilled

## 2023-02-05 NOTE — Progress Notes (Signed)
Cardiology Office Note:  .   Date:  02/07/2023  ID:  Donald Barry, DOB Oct 20, 1945, MRN [id number] PCP: Kandyce Rud, MD  [facility] Providers Cardiologist:  Lorine Bears, MD    History of Present Illness: .   Donald Barry is a 77 y.o. 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  During hospitalization in 01/2021 with pyelonephritis complicated by sepsis due to Enterobacter he was found to be in atrial fibrillation with RVR.  He was treated with rate control and subsequently converted to sinus rhythm.  He was initially not anticoagulated due to CHA2DS2-VASc score of 1 and other medical issues making him at risk for bleeding.  Echocardiogram revealed EF 50-25%, calcified aortic valve without significant stenosis.  He experienced recurrent atrial fibrillation with RVR in the setting of having a sleep study done.  He underwent extensive GI workup for blood loss anemia which had been nonrevealing.  Recurrent A-fib RVR in 01/2022 was noted to be in sinus rhythm in the ED.  He had also recently been diagnosed with a COVID infection.  He was subsequently started on apixaban for stroke prevention.  He was hospitalized again at [facility] in late December for MSSA bacteremia due to UTI.  He was treated with antibiotics and unfortunately had another admission in January for recurrent infection.  He presented to the [facility] emergency department on 01/05/2023 with complaints of chest pain that woke him up.  He was also found to be back in atrial fibrillation.  He stated he took 1 metoprolol with no relief.  He had had mild pressure in the center of his chest and states it was not typical for his episodes of atrial fibrillation.  Workup in the emergency department was unrevealing.  He was last seen in clinic 01/22/2023 after  recent visit to the emergency department.  He stated that he had chest pressure off and on for the past month in his left upper chest.  He was sent for labs and scheduled for [facility].  Myoview was considered low risk scan without concerns for significant ischemia.  He returns to clinic today stating that overall he has been doing well/ He continues to have some occasional chest discomfort that is fleeting, he states nothing that makes him stop what he is doing. It is not related to exertion.  He has been no aggravating or alleviating symptoms.  He also states it has not changed in frequency or intensity.  He denies any associated symptoms of shortness of breath, palpitations, peripheral edema.  States that he has been compliant with his medications without noticed any bleeding with blood in stool noted in his urine.  Denies any hospitalizations or visits to the emergency department.  ROS: 10 point review of systems has been reviewed and considered negative with exception of what is been listed in the HPI  Studies Reviewed: Marland Kitchen       Lexiscan MPI 01/30/23 Pharmacological myocardial perfusion imaging study with no significant  ischemia Normal wall motion, EF estimated at 58% No EKG changes concerning for ischemia at peak stress or in recovery. CT attenuation correction images with aortic valve calcification, no significant coronary artery or aortic calcification Low risk scan  TTE 01/12/21 1. Left ventricular ejection fraction, by estimation, is 50 to 55%. The  left ventricle has low normal function. The left ventricle demonstrates  global hypokinesis. Left ventricular diastolic parameters are consistent  with Grade III diastolic dysfunction   (restrictive).   2. Right ventricular systolic function is normal. The right ventricular  size is normal.   3. 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 No  evidence of mitral stenosis.   4. The aortic valve is normal in  structure. Aortic valve regurgitation is  mild. Mild aortic valve sclerosis is present, with no evidence of aortic  valve stenosis.   5. The inferior vena cava is normal in size with greater than 50%  respiratory variability, suggesting right atrial pressure of 3 mmHg.  Risk Assessment/Calculations:    CHA2DS2-VASc Score = 3   This indicates a 3.2% annual risk of stroke. The patient's score is based upon: CHF History: 0 HTN History: 0 Diabetes History: 0 Stroke History: 0 Vascular Disease History: 1 Age Score: 2 Gender Score: 0            Physical Exam:   VS:  BP 120/68 (BP Location: Left Arm, Patient Position: Sitting, Cuff Size: Normal)   Pulse 71   Ht 6' (1.829 m)   Wt 200 lb 6.4 oz (90.9 kg)   SpO2 97%   BMI 27.18 kg/m    Wt Readings from Last 3 Encounters:  02/07/23 200 lb 6.4 oz (90.9 kg)  01/22/23 202 lb 6.4 oz (91.8 kg)  01/05/23 194 lb (88 kg)    GEN: Well nourished, well developed in no acute distress NECK: No JVD; No carotid bruits CARDIAC: RRR, no murmurs, rubs, gallops RESPIRATORY:  Clear to auscultation without rales, wheezing or rhonchi  ABDOMEN: Soft, non-tender, non-distended EXTREMITIES:  No edema; No deformity   ASSESSMENT AND PLAN: .   Atypical chest discomfort that he states has been off and on.  Recently underwent a Lexiscan Myoview which showed no significant ischemia and was considered a low risk scan.  States his pain has not been associated with exertion and he has found no aggravating or alleviating factors.  Continued on apixaban in lieu of aspirin and atorvastatin 10 mg daily.  Paroxysmal atrial fibrillation which she has been on home Xarelto milligrams daily.  He is continued on apixaban 5 mg twice daily for CHA2DS2-VASc score of at least 3 for stroke prophylaxis as well as metoprolol titrate 12.5 mg twice daily.  He has had no concerns of bleeding with no blood noted in his stool or urine and last hemoglobin was recently checked and resulted  at 15.  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  Is continued on atorvastatin 10 mg daily.  This continues to be monitored by his PCP.  Obstructive sleep apnea where he states he is compliant and tolerating his CPAP.  Aortic atherosclerosis with no symptoms concerning decompensation.  Continue with primary prevention with atorvastatin.    Dispo: Patient to return to clinic to see MD/APP in 3 to 4 months or sooner if needed for reevaluation of symptoms.  Signed, Azalynn Maxim, NP

## 2023-02-07 ENCOUNTER — Encounter: Payer: Self-pay | Admitting: Cardiology

## 2023-02-07 ENCOUNTER — Ambulatory Visit: Payer: Medicare HMO | Attending: Cardiology | Admitting: Cardiology

## 2023-02-07 VITALS — BP 120/68 | HR 71 | Ht 72.0 in | Wt 200.4 lb

## 2023-02-07 DIAGNOSIS — E785 Hyperlipidemia, unspecified: Secondary | ICD-10-CM | POA: Diagnosis not present

## 2023-02-07 DIAGNOSIS — I480 Paroxysmal atrial fibrillation: Secondary | ICD-10-CM | POA: Diagnosis not present

## 2023-02-07 DIAGNOSIS — R0789 Other chest pain: Secondary | ICD-10-CM

## 2023-02-07 DIAGNOSIS — G4733 Obstructive sleep apnea (adult) (pediatric): Secondary | ICD-10-CM

## 2023-02-07 DIAGNOSIS — I700 Atherosclerosis of aorta: Secondary | ICD-10-CM | POA: Diagnosis not present

## 2023-02-07 NOTE — Patient Instructions (Signed)
Medication Instructions:  Your physician recommends that you continue on your current medications as directed. Please refer to the Current Medication list given to you today.   *If you need a refill on your cardiac medications before your next appointment, please call your pharmacy*   Lab Work: No labs ordered today    Testing/Procedures: No test ordered today    Follow-Up: At [facility], you and your health needs are our priority.  As part of our continuing mission to provide you with exceptional heart care, we have created designated Provider Care Teams.  These Care Teams include your primary Cardiologist (physician) and Advanced Practice Providers (APPs -  Physician Assistants and Nurse Practitioners) who all work together to provide you with the care you need, when you need it.  We recommend signing up for the patient portal called "MyChart".  Sign up information is provided on this After Visit Summary.  MyChart is used to connect with patients for Virtual Visits (Telemedicine).  Patients are able to view lab/test results, encounter notes, upcoming appointments, etc.  Non-urgent messages can be sent to your provider as well.   To learn more about what you can do with MyChart, go to [email or web address].    Your next appointment:   3-4 month(s)  Provider:   Charlsie Quest, NP

## 2023-04-30 ENCOUNTER — Encounter: Payer: Self-pay | Admitting: Oncology

## 2023-04-30 ENCOUNTER — Inpatient Hospital Stay: Payer: Medicare HMO | Attending: Oncology

## 2023-04-30 ENCOUNTER — Inpatient Hospital Stay (HOSPITAL_BASED_OUTPATIENT_CLINIC_OR_DEPARTMENT_OTHER): Payer: Medicare HMO | Admitting: Oncology

## 2023-04-30 VITALS — BP 116/80 | HR 69 | Temp 97.3°F | Resp 18 | Wt 201.0 lb

## 2023-04-30 DIAGNOSIS — D509 Iron deficiency anemia, unspecified: Secondary | ICD-10-CM | POA: Insufficient documentation

## 2023-04-30 DIAGNOSIS — D508 Other iron deficiency anemias: Secondary | ICD-10-CM

## 2023-04-30 DIAGNOSIS — D472 Monoclonal gammopathy: Secondary | ICD-10-CM | POA: Insufficient documentation

## 2023-04-30 LAB — CBC WITH DIFFERENTIAL/PLATELET
Abs Immature Granulocytes: 0.02 10*3/uL (ref 0.00–0.07)
Basophils Absolute: 0 10*3/uL (ref 0.0–0.1)
Basophils Relative: 0 %
Eosinophils Absolute: 0.1 10*3/uL (ref 0.0–0.5)
Eosinophils Relative: 1 %
HCT: 39.9 % (ref 39.0–52.0)
Hemoglobin: 13.7 g/dL (ref 13.0–17.0)
Immature Granulocytes: 0 %
Lymphocytes Relative: 19 %
Lymphs Abs: 1.1 10*3/uL (ref 0.7–4.0)
MCH: 32.2 pg (ref 26.0–34.0)
MCHC: 34.3 g/dL (ref 30.0–36.0)
MCV: 93.7 fL (ref 80.0–100.0)
Monocytes Absolute: 0.7 10*3/uL (ref 0.1–1.0)
Monocytes Relative: 12 %
Neutro Abs: 3.9 10*3/uL (ref 1.7–7.7)
Neutrophils Relative %: 68 %
Platelets: 139 10*3/uL — ABNORMAL LOW (ref 150–400)
RBC: 4.26 MIL/uL (ref 4.22–5.81)
RDW: 13.1 % (ref 11.5–15.5)
WBC: 5.8 10*3/uL (ref 4.0–10.5)
nRBC: 0 % (ref 0.0–0.2)

## 2023-04-30 LAB — IRON AND TIBC
Iron: 94 ug/dL (ref 45–182)
Saturation Ratios: 31 % (ref 17.9–39.5)
TIBC: 307 ug/dL (ref 250–450)
UIBC: 213 ug/dL

## 2023-04-30 LAB — FERRITIN: Ferritin: 58 ng/mL (ref 24–336)

## 2023-05-01 ENCOUNTER — Encounter: Payer: Self-pay | Admitting: Oncology

## 2023-05-01 NOTE — Progress Notes (Signed)
Hematology/Oncology Consult note [facility]  Telephone:([phone] [phone]  Patient Care Team: Kandyce Rud, MD as PCP - General (Family Medicine) Iran Ouch, MD as PCP - Cardiology (Cardiology) Creig Hines, MD as Consulting Physician (Hematology and Oncology)   Name of the patient: Donald Barry  [id number]  Sep 15, 1945   Date of visit: 05/01/23  Diagnosis-iron deficiency anemia IgM MGUS  Chief complaint/ Reason for visit-routine follow-up of anemia and MGUS  Heme/Onc history: patient is a 77 year old male with a past medical history of fatty liver and GERD who was seen by pulmonology clinic GI for symptoms of abdominal pain.  As a part of that work-up patient had SPEP done Which showed mildly elevated IgM levels of 202.  IgM monoclonal kappa light chain was noted on immunofixation 0.2 g.  Hence the patient has been referred to [place].  Of note patient had a normal CBC with a white count of 4.1, H&H of 14.8/43.2 in February 2020.  His platelet counts have always been mildly low in the 120s.  CMP was unremarkable.  Iron studies were normal.  Patient also had CT of the abdomen done as a part of microscopic hematuria in February 2020 which did not reveal any evidence of splenomegaly   Results of blood work from 09/24/2018 were as follows: CBC showed white count of 4.7, H&H of 14.7/43.1.  Count of 147.  CMP was within normal limits.  Multiple myeloma panel showed M protein IgM kappa 0.3 g.  Serum free light chains revealed a mildly elevated kappa free light chain 21.6 with a kappa lambda light chain ratio of 1.86.    Interval history-patient is doing well for his age and denies any complaints at this time.  Appetite and weight have remained stable.  Denies any blood loss in his stool or urine.  ECOG PS- 1 Pain scale- 0   Review of systems- Review of Systems  Constitutional:  Negative for chills, fever, malaise/fatigue and weight loss.   HENT:  Negative for congestion, ear discharge and nosebleeds.   Eyes:  Negative for blurred vision.  Respiratory:  Negative for cough, hemoptysis, sputum production, shortness of breath and wheezing.   Cardiovascular:  Negative for chest pain, palpitations, orthopnea and claudication.  Gastrointestinal:  Negative for abdominal pain, blood in stool, constipation, diarrhea, heartburn, melena, nausea and vomiting.  Genitourinary:  Negative for dysuria, flank pain, frequency, hematuria and urgency.  Musculoskeletal:  Negative for back pain, joint pain and myalgias.  Skin:  Negative for rash.  Neurological:  Negative for dizziness, tingling, focal weakness, seizures, weakness and headaches.  Endo/Heme/Allergies:  Does not bruise/bleed easily.  Psychiatric/Behavioral:  Negative for depression and suicidal ideas. The patient does not have insomnia.       Allergies  Allergen Reactions   Tape Dermatitis    Skin blistered- steri strip  Skin blistered   Wound Dressing Adhesive Dermatitis    Skin blistered- steri strip   Shellfish Allergy Diarrhea and Nausea And Vomiting    Shrimp    Shellfish-Derived Products Diarrhea, Nausea And Vomiting and Nausea Only   Methenamine Diarrhea and Nausea And Vomiting   Onion Other (See Comments)    diarrhea Diarrhea- verified avoids all onion ingredient (including onion powder). MM, RD 12/19/20   Other Nausea And Vomiting    general anesthesia   Codeine Nausea Only     Past Medical History:  Diagnosis Date   A-fib (HCC)    Actinic keratosis  Hx of PDT Tx., face 1/252021 and scalp 07/05/2019   Allergic rhinitis, seasonal 06/14/2014   Allergy    Arthritis    Chronic neck pain 06/14/2014   Complication of anesthesia    Gastroesophageal reflux disease without esophagitis 06/14/2014   Headache    h/o migraines   Hepatic steatosis 04/05/2018   Hiatal hernia 09/09/2017   Intermittent left lower quadrant abdominal pain 07/15/2017   PONV  (postoperative nausea and vomiting)    Sleep apnea    cpap   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 SCCis   Squamous cell skin cancer 07/2017   Resected from scalp.     Past Surgical History:  Procedure Laterality Date   BACK SURGERY  2008   L4, L5   COLONOSCOPY     cystectomy with prostatectomy ileal conduit     CYSTO WITH HYDRODISTENSION N/A 06/17/2019   Procedure: CYSTOSCOPY/HYDRODISTENSION;  Surgeon: Sondra Come, MD;  Location: [facility] ORS;  Service: Urology;  Laterality: N/A;   CYSTOSCOPY WITH BIOPSY N/A 07/17/2018   Procedure: CYSTOSCOPY WITH BLADDER BIOPSY;  Surgeon: Sondra Come, MD;  Location: [facility] ORS;  Service: Urology;  Laterality: N/A;   CYSTOSCOPY WITH BIOPSY N/A 06/17/2019   Procedure: CYSTOSCOPY WITH BLADDER  BIOPSY;  Surgeon: Sondra Come, MD;  Location: [facility] ORS;  Service: Urology;  Laterality: N/A;   CYSTOSCOPY WITH FULGERATION N/A 07/17/2018   Procedure: CYSTOSCOPY WITH FULGERATION;  Surgeon: Sondra Come, MD;  Location: [facility] ORS;  Service: Urology;  Laterality: N/A;   CYSTOSCOPY WITH FULGERATION N/A 06/17/2019   Procedure: CYSTOSCOPY WITH FULGERATION;  Surgeon: Sondra Come, MD;  Location: [facility] ORS;  Service: Urology;  Laterality: N/A;   ESOPHAGOGASTRODUODENOSCOPY     HERNIA REPAIR  2012   umbilical   STOMACH SURGERY     FUNDIC GLAND POLYP    Social History   Socioeconomic History   Marital status: Married    Spouse name: Not on file   Number of children: Not on file   Years of education: Not on file   Highest education level: Not on file  Occupational History   Not on file  Tobacco Use   Smoking status: Never   Smokeless tobacco: Never  Vaping Use   Vaping status: Never Used  Substance and Sexual Activity   Alcohol use: Never   Drug use: Never   Sexual activity: Yes    Birth control/protection: None  Other Topics Concern    Not on file  Social History Narrative   Not on file   Social Drivers of Health   Financial Resource Strain: Patient Declined (01/23/2023)   Received from [facility]   Overall Financial Resource Strain (CARDIA)    Difficulty of Paying Living Expenses: Patient declined  Food Insecurity: Low Risk  (03/10/2023)   Received from [facility]   Hunger Vital Sign    Worried About Running Out of Food in the Last Year: Never true    Ran Out of Food in the Last Year: Never true  Transportation Needs: No Transportation Needs (03/10/2023)   Received from [facility]    In the past 12 months, has lack of reliable transportation kept you from medical appointments, meetings, work or from getting things needed for daily living? : No  Physical Activity: Not on file  Stress: Not on file  Social Connections: Not  on file  Intimate Partner Violence: Low Risk  (05/25/2022)   Received from [facility] visits prior to 07/13/2022., [facility] visits prior to 07/13/2022.   Safety    How often does anyone, including family and friends, physically hurt you?: Never    How often does anyone, including family and friends, insult or talk down to you?: Never    How often does anyone, including family and friends, threaten you with harm?: Never    How often does anyone, including family and friends, scream or curse at you?: Never    Family History  Problem Relation Age of Onset   Hypertension Mother    Heart disease Father    GER disease Sister    GER disease Brother    Prostate cancer Neg Hx    Kidney cancer Neg Hx      Current Outpatient Medications:    atorvastatin (LIPITOR) 10 MG tablet, Take 1 tablet by mouth daily., Disp: , Rfl:    Coenzyme Q10 (COQ10) 100 MG CAPS, Take 1 capsule by mouth daily., Disp: , Rfl:    ELIQUIS 5 MG TABS tablet, TAKE 1 TABLET BY MOUTH TWICE A DAY, Disp: 180 tablet, Rfl: 3   ferrous sulfate 325 (65  FE) MG EC tablet, TAKE 1 TABLET BY MOUTH EVERY DAY (Patient taking differently: Take 1 tablet by mouth. Monday, Wednesday and Friday.), Disp: 90 tablet, Rfl: 1   fluticasone (FLONASE) 50 MCG/ACT nasal spray, Place 1 spray into both nostrils daily., Disp: , Rfl:    guaiFENesin (MUCINEX) 600 MG 12 hr tablet, Take by mouth 2 (two) times daily., Disp: , Rfl:    loratadine (CLARITIN) 10 MG tablet, Take 1 tablet by mouth daily., Disp: , Rfl:    metoprolol tartrate (LOPRESSOR) 25 MG tablet, Take 0.5 tablets (12.5 mg total) by mouth 2 (two) times daily., Disp: 30 tablet, Rfl: 0   Multiple Vitamin (MULTIVITAMIN WITH MINERALS) TABS tablet, Take 1 tablet by mouth daily. Iron Free, Disp: , Rfl:    NON FORMULARY, Place 1 Dose/kg into the nose daily as needed. Netty pot for sinuses, Disp: , Rfl:    omeprazole (PRILOSEC) 40 MG capsule, Take 1 capsule by mouth daily., Disp: , Rfl:    sulfamethoxazole-trimethoprim (BACTRIM) 400-80 MG tablet, Take 1 tablet by mouth daily., Disp: , Rfl:    traZODone (DESYREL) 100 MG tablet, Take 100 mg by mouth at bedtime., Disp: , Rfl:   Physical exam:  Vitals:   04/30/23 1350  BP: 116/80  Pulse: 69  Resp: 18  Temp: (!) 97.3 F (36.3 C)  TempSrc: Tympanic  SpO2: 97%  Weight: 201 lb (91.2 kg)   Physical Exam Cardiovascular:     Rate and Rhythm: Normal rate and regular rhythm.     Heart sounds: Normal heart sounds.  Pulmonary:     Effort: Pulmonary effort is normal.     Breath sounds: Normal breath sounds.  Skin:    General: Skin is warm and dry.  Neurological:     Mental Status: He is alert and oriented to person, place, and time.         Latest Ref Rng & Units 01/05/2023    9:22 AM  CMP  Glucose 70 - 99 mg/dL 99   BUN 8 - 23 mg/dL 11   Creatinine 4.09 - 1.24 mg/dL 8.11   Sodium 914 - 782 mmol/L 137   Potassium 3.5 - 5.1 mmol/L 3.9   Chloride 98 - 111  mmol/L 107   CO2 22 - 32 mmol/L 25   Calcium 8.9 - 10.3 mg/dL 8.5   Total Protein 6.5 - 8.1 g/dL 6.9    Total Bilirubin 0.3 - 1.2 mg/dL 0.8   Alkaline Phos 38 - 126 U/L 28   AST 15 - 41 U/L 28   ALT 0 - 44 U/L 22       Latest Ref Rng & Units 04/30/2023    1:02 PM  CBC  WBC 4.0 - 10.5 K/uL 5.8   Hemoglobin 13.0 - 17.0 g/dL 91.4   Hematocrit 78.2 - 52.0 % 39.9   Platelets 150 - 400 K/uL 139      Assessment and plan- Patient is a 77 y.o. male here for routine follow-up of following Issues  Iron deficiency anemia: Patient is not presently anemic with an H&H of 13.7/29.9.  Ferritin levels are normal at 58 with normal iron studies.  He does not require any IV iron at this time.  CBC ferritin and iron studies in 6 months in 1 year and I will see him back in 1 year  IgM MGUS: Patient had a myeloma panel checked back in June 2024 which showed a stable 0.3 IgM kappa monoclonal protein serum free light chain ratio has remained stable between 1.5-2.  This will again be checked in 6 months and I will see him back in 1 year   Visit Diagnosis 1. Other iron deficiency anemia   2. MGUS (monoclonal gammopathy of unknown significance)      Dr. Owens Shark, MD, MPH [facility] at [facility] [phone] 05/01/2023 1:38 PM

## 2023-05-12 ENCOUNTER — Encounter: Payer: Self-pay | Admitting: Oncology

## 2023-05-15 ENCOUNTER — Ambulatory Visit: Payer: Medicare HMO | Attending: Cardiology | Admitting: Cardiology

## 2023-05-15 ENCOUNTER — Encounter: Payer: Self-pay | Admitting: Cardiology

## 2023-05-15 VITALS — BP 106/64 | HR 68 | Ht 72.0 in | Wt 204.8 lb

## 2023-05-15 DIAGNOSIS — I480 Paroxysmal atrial fibrillation: Secondary | ICD-10-CM | POA: Diagnosis not present

## 2023-05-15 DIAGNOSIS — E782 Mixed hyperlipidemia: Secondary | ICD-10-CM | POA: Diagnosis not present

## 2023-05-15 DIAGNOSIS — G4733 Obstructive sleep apnea (adult) (pediatric): Secondary | ICD-10-CM | POA: Diagnosis not present

## 2023-05-15 DIAGNOSIS — I700 Atherosclerosis of aorta: Secondary | ICD-10-CM

## 2023-05-15 DIAGNOSIS — I739 Peripheral vascular disease, unspecified: Secondary | ICD-10-CM

## 2023-05-15 MED ORDER — APIXABAN 5 MG PO TABS: 5.0000 mg | ORAL_TABLET | Freq: Two times a day (BID) | ORAL | 3 refills | Status: DC

## 2023-05-15 NOTE — Patient Instructions (Signed)
 Medication Instructions:  - No changes *If you need a refill on your cardiac medications before your next appointment, please call your pharmacy*  Lab Work: - None ordered  Testing/Procedures: Your physician has requested that you have an ankle brachial index (ABI). During this test an ultrasound and blood pressure cuff are used to evaluate the arteries that supply the arms and legs with blood. Allow thirty minutes for this exam. There are no restrictions or special instructions.  Please note: We ask at that you not bring children with you during ultrasound (echo/ vascular) testing. Due to room size and safety concerns, children are not allowed in the ultrasound rooms during exams. Our front office staff cannot provide observation of children in our lobby area while testing is being conducted. An adult accompanying a patient to their appointment will only be allowed in the ultrasound room at the discretion of the ultrasound technician under special circumstances. We apologize for any inconvenience.   Follow-Up: At [facility], you and your health needs are our priority.  As part of our continuing mission to provide you with exceptional heart care, we have created designated Provider Care Teams.  These Care Teams include your primary Cardiologist (physician) and Advanced Practice Providers (APPs -  Physician Assistants and Nurse Practitioners) who all work together to provide you with the care you need, when you need it.  Your next appointment:   4 -5  month(s)  Provider:   Tylene Lunch, NP

## 2023-05-15 NOTE — Progress Notes (Signed)
 Cardiology Office Note:  .   Date:  05/15/2023  ID:  Donald Barry, DOB [birth year], MRN [id number] PCP: Diedra Lame, MD  [facility] Providers Cardiologist:  Deatrice Cage, MD    History of Present Illness: .   Donald Barry is a 78 y.o. 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   During hospitalization in 01/2021 with pyelonephritis complicated by sepsis due to Enterobacter he was found to be in atrial fibrillation with RVR. He was treated with rate control and subsequently converted to sinus rhythm. He was initially not anticoagulated due to CHA2DS2-VASc score of 1 and other medical issues making him at risk for bleeding. Echocardiogram revealed EF 50-25%, calcified aortic valve without significant stenosis. He experienced recurrent atrial fibrillation with RVR in the setting of having a sleep study done. He underwent extensive GI workup for blood loss anemia which had been nonrevealing. Recurrent A-fib RVR in 01/2022 was noted to be in sinus rhythm in the ED. He had also recently been diagnosed with a COVID infection. He was subsequently started on apixaban  for stroke prevention. He was hospitalized again at [facility] in late December for MSSA bacteremia due to UTI. He was treated with antibiotics and unfortunately had another admission in January for recurrent infection. He presented to the [facility] emergency department on 01/05/2023 with complaints of chest pain that woke him up. He was also found to be back in atrial fibrillation. He stated he took 1 metoprolol  with no relief. He had had mild pressure in the center of his chest and states it was not typical for his episodes of atrial fibrillation. Workup in the emergency department was unrevealing.  Lexiscan  Myoview  completed 01/19/2021 for was considered  low risk without concerns for significant ischemia.   He was last seen in clinic 02/07/2023 overall he been doing well.  Continue to occasionally have chest discomfort that was fleeting.  There were no changes to his medication regimen and no further testing that was ordered.  He returns to clinic today stating that overall he has been doing well.  Denies any chest pain, shortness of breath, dyspnea on exertion, lightheadedness or dizziness.  He occasionally does have some swelling to his lower extremities.  He also is complaining of discomfort to his bilateral lower extremities and changes to sensation.  Denies any bleeding or noted blood in his urine or stool.  Has been compliant with his current medication regimen without any adverse effects.  Denies any recent hospitalizations or visits to the emergency department.  ROS: 10 point review of systems has been reviewed and considered negative with exception of what is been listed in the HPI.  Studies Reviewed: SABRA   EKG Interpretation Date/Time:  Thursday May 15 2023 08:29:00 EST Ventricular Rate:  68 PR Interval:  174 QRS Duration:  84 QT Interval:  402 QTC Calculation: 427 R Axis:   23  Text Interpretation: Normal sinus rhythm Normal ECG When compared with ECG of 22-Jan-2023 08:28, No significant change was found Confirmed by Gerard Frederick ([id number]) on 05/15/2023 8:31:44 AM    Lexiscan  MPI 01/30/23 Pharmacological myocardial perfusion imaging study with no significant  ischemia Normal wall motion, EF estimated at 58% No EKG changes concerning for ischemia at peak stress or in recovery. CT attenuation correction images with aortic valve calcification, no significant coronary artery or aortic calcification Low  risk scan   TTE 01/12/21 1. Left ventricular ejection fraction, by estimation, is 50 to 55%. The  left ventricle has low normal function. The left ventricle demonstrates  global hypokinesis. Left ventricular diastolic parameters are  consistent  with Grade III diastolic dysfunction   (restrictive).   2. Right ventricular systolic function is normal. The right ventricular  size is normal.   3. 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 No  evidence of mitral stenosis.   4. The aortic valve is normal in structure. Aortic valve regurgitation is  mild. Mild aortic valve sclerosis is present, with no evidence of aortic  valve stenosis.   5. The inferior vena cava is normal in size with greater than 50%  respiratory variability, suggesting right atrial pressure of 3 mmHg.  Risk Assessment/Calculations:    CHA2DS2-VASc Score = 3   This indicates a 3.2% annual risk of stroke. The patient's score is based upon: CHF History: 0 HTN History: 0 Diabetes History: 0 Stroke History: 0 Vascular Disease History: 1 Age Score: 2 Gender Score: 0            Physical Exam:   VS:  BP 106/64   Pulse 68   Ht 6' (1.829 m)   Wt 204 lb 12.8 oz (92.9 kg)   SpO2 97%   BMI 27.78 kg/m    Wt Readings from Last 3 Encounters:  05/15/23 204 lb 12.8 oz (92.9 kg)  04/30/23 201 lb (91.2 kg)  02/07/23 200 lb 6.4 oz (90.9 kg)    GEN: Well nourished, well developed in no acute distress NECK: No JVD; No carotid bruits CARDIAC: RRR, no murmurs, rubs, gallops RESPIRATORY:  Clear to auscultation without rales, wheezing or rhonchi  ABDOMEN: Soft, non-tender, non-distended EXTREMITIES: Trace pretibial edema; No deformity   ASSESSMENT AND PLAN: .   Paroxysmal atrial fibrillation which she maintains sinus rhythm today with EKG revealing sinus rhythm with a rate of 68 with no significant changes found from prior studies.  He is continued on apixaban  5 mg twice daily for CHA2DS2-VASc of at least 3 for stroke prophylaxis as well as dose of metoprolol  to tartrate 12.5 mg twice daily.  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  He is continued on atorvastatin  10 mg daily.  This continues to be monitored by his  PCP.  Intermittent claudication and decreased sensation to the bilateral lower extremities with left being worse than the right.  He has been scheduled for ABIs to rule out any circulatory concerns.  If ABI's result is normal he will need to follow-up with his PCP for neuropathy.  He has some trace edema noted to his bilateral lower extremities but does have palpable pulses.  Obstructive sleep apnea where he continues to be compliant with his CPAP.  Aortic atherosclerosis but no symptoms concerning for decompensation.  Continue with primary prevention with atorvastatin  10 mg daily and apixaban  and lieu of aspirin .  Recently had stress testing completed which was low risk scan which revealed no ischemia.       Dispo: Patient to return to clinic to see MD/APP in 4 to 5 months or sooner if needed for reevaluation of symptoms.  Signed, Boen Sterbenz, NP

## 2023-05-27 ENCOUNTER — Other Ambulatory Visit: Payer: Self-pay | Admitting: Cardiology

## 2023-05-27 DIAGNOSIS — I700 Atherosclerosis of aorta: Secondary | ICD-10-CM

## 2023-05-27 DIAGNOSIS — G4733 Obstructive sleep apnea (adult) (pediatric): Secondary | ICD-10-CM

## 2023-05-27 DIAGNOSIS — I480 Paroxysmal atrial fibrillation: Secondary | ICD-10-CM

## 2023-05-27 DIAGNOSIS — I739 Peripheral vascular disease, unspecified: Secondary | ICD-10-CM

## 2023-05-27 DIAGNOSIS — E782 Mixed hyperlipidemia: Secondary | ICD-10-CM

## 2023-06-04 ENCOUNTER — Ambulatory Visit: Payer: Medicare HMO

## 2023-06-19 ENCOUNTER — Ambulatory Visit: Payer: Medicare HMO | Attending: Cardiology

## 2023-06-19 DIAGNOSIS — I739 Peripheral vascular disease, unspecified: Secondary | ICD-10-CM

## 2023-06-20 LAB — VAS US ABI WITH/WO TBI: Left ABI: 1.27

## 2023-06-26 NOTE — Progress Notes (Signed)
Study showed decreased flow to the toes. Pulses are palpable on exam.  Recommend walking for minimum of 30 minutes daily.  Will have primary cardiologist weight in on additional recommendations.

## 2023-06-29 ENCOUNTER — Other Ambulatory Visit: Payer: Self-pay

## 2023-06-29 ENCOUNTER — Emergency Department: Payer: Medicare HMO

## 2023-06-29 ENCOUNTER — Emergency Department
Admission: EM | Admit: 2023-06-29 | Discharge: 2023-06-29 | Disposition: A | Discharge status: Home / Self Care | Payer: Medicare HMO | Attending: Emergency Medicine | Admitting: Emergency Medicine

## 2023-06-29 ENCOUNTER — Encounter: Payer: Self-pay | Admitting: Emergency Medicine

## 2023-06-29 DIAGNOSIS — Z7901 Long term (current) use of anticoagulants: Secondary | ICD-10-CM | POA: Diagnosis not present

## 2023-06-29 DIAGNOSIS — R079 Chest pain, unspecified: Secondary | ICD-10-CM

## 2023-06-29 DIAGNOSIS — I4891 Unspecified atrial fibrillation: Secondary | ICD-10-CM

## 2023-06-29 DIAGNOSIS — R002 Palpitations: Secondary | ICD-10-CM | POA: Diagnosis present

## 2023-06-29 LAB — CBC
HCT: 45.9 % (ref 39.0–52.0)
Hemoglobin: 15.8 g/dL (ref 13.0–17.0)
MCH: 32.2 pg (ref 26.0–34.0)
MCHC: 34.4 g/dL (ref 30.0–36.0)
MCV: 93.5 fL (ref 80.0–100.0)
Platelets: 158 10*3/uL (ref 150–400)
RBC: 4.91 MIL/uL (ref 4.22–5.81)
RDW: 13.3 % (ref 11.5–15.5)
WBC: 8.5 10*3/uL (ref 4.0–10.5)
nRBC: 0 % (ref 0.0–0.2)

## 2023-06-29 LAB — BASIC METABOLIC PANEL
Anion gap: 9 (ref 5–15)
BUN: 21 mg/dL (ref 8–23)
CO2: 23 mmol/L (ref 22–32)
Calcium: 8.9 mg/dL (ref 8.9–10.3)
Chloride: 110 mmol/L (ref 98–111)
Creatinine, Ser: 1.07 mg/dL (ref 0.61–1.24)
GFR, Estimated: >60 mL/min (ref >60)
Glucose, Bld: 88 mg/dL (ref 70–99)
Potassium: 4 mmol/L (ref 3.5–5.1)
Sodium: 142 mmol/L (ref 135–145)

## 2023-06-29 LAB — PROTIME-INR
INR: 1.1 (ref 0.8–1.2)
Prothrombin Time: 14 s (ref 11.4–15.2)

## 2023-06-29 LAB — RESP PANEL BY RT-PCR (RSV, FLU A&B, COVID)  RVPGX2
Influenza A by PCR: NEGATIVE
Influenza B by PCR: NEGATIVE
Resp Syncytial Virus by PCR: NEGATIVE
SARS Coronavirus 2 by RT PCR: NEGATIVE

## 2023-06-29 LAB — MAGNESIUM: Magnesium: 1.9 mg/dL (ref 1.7–2.4)

## 2023-06-29 LAB — TROPONIN I (HIGH SENSITIVITY)
Troponin I (High Sensitivity): 7 ng/L (ref <18)
Troponin I (High Sensitivity): 7 ng/L (ref <18)

## 2023-06-29 NOTE — Discharge Instructions (Addendum)
 Please follow-up with your cardiologist for further management of atrial fibrillation.  Please return if you have any recurrence of symptoms, lightheadedness, chest pressure, shortness of breath, leg swelling.  Or if you have any additional concerns.

## 2023-06-29 NOTE — ED Triage Notes (Signed)
 Patient to ED via POV for left sided CP. PT reports feeling his afib last night and took a PRN metoprolol. Woke up this AM feeling the same.

## 2023-06-29 NOTE — ED Provider Notes (Signed)
 [facility] Provider Note    Event Date/Time   First MD Initiated Contact with Patient 06/29/23 [phone]     (approximate)   History   Chest Pain   HPI  Donald Barry is a 78 y.o. male history of paroxysmal A-fib on Eliquis metoprolol, history of allergic rhinitis, GERD, presenting with palpitations.  Also had short episodes of left-sided chest pain.  States that when he woke up in night, he noted some shortness of breath.  Denies any shortness of breath right now.  No unilateral calf swelling or tenderness.  Denies history of DVT.  States that he has had a cough.  Also states that he has some rhinitis and congestion in his right ear.  No headache, no nausea, vomiting, diarrhea, abdominal pain.  He has a urostomy in place, states that urine is not cloudy, he is no back pain.  Takes his medications as prescribed.  When he had the palpitations, he took an as needed metoprolol.  On his med chart review he was seen by his cardiologist in January, he was in sinus rhythm at that time, has been tolerating the metoprolol.     Physical Exam   Triage Vital Signs: ED Triage Vitals  Encounter Vitals Group     BP 06/29/23 0811 107/75     Systolic BP Percentile --      Diastolic BP Percentile --      Pulse Rate 06/29/23 0812 76     Resp 06/29/23 0811 18     Temp 06/29/23 0811 97.6 F (36.4 C)     Temp Source 06/29/23 0811 Oral     SpO2 06/29/23 0812 100 %     Weight 06/29/23 0811 194 lb (88 kg)     Height 06/29/23 0811 6' (1.829 m)     Head Circumference --      Peak Flow --      Pain Score 06/29/23 0811 3     Pain Loc --      Pain Education --      Exclude from Growth Chart --     Most recent vital signs: Vitals:   06/29/23 0811 06/29/23 0812  BP: 107/75   Pulse:  76  Resp: 18   Temp: 97.6 F (36.4 C)   SpO2:  100%     General: Awake, no distress.  CV:  Good peripheral perfusion.   Resp:  Normal effort.  Clear Abd:  No distention.  Soft  nontender Other:  TMs are clear bilaterally.  Urostomy bag in place, clear yellow urine.  No lower extremity edema.  No unilateral calf swelling or tenderness.   ED Results / Procedures / Treatments   Labs (all labs ordered are listed, but only abnormal results are displayed) Labs Reviewed  RESP PANEL BY RT-PCR (RSV, FLU A&B, COVID)  [id number]  BASIC METABOLIC PANEL  CBC  PROTIME-INR  MAGNESIUM  TROPONIN I (HIGH SENSITIVITY)  TROPONIN I (HIGH SENSITIVITY)     EKG  Atrial fibrillation, rate 80, normal QRS, normal QTc, T wave flattening in aVL, no ischemic ST elevation, prior EKG showed sinus rhythm, this is change compared to prior since it shows atrial fibrillation   RADIOLOGY Chest x-ray on my interpretation without focal consolidation   PROCEDURES:  Critical Care performed: No  Procedures   MEDICATIONS ORDERED IN ED: Medications - No data to display   IMPRESSION / MDM / ASSESSMENT AND PLAN / ED COURSE  I reviewed the triage vital  signs and the nursing notes.                              Differential diagnosis includes, but is not limited to, arrhythmia, electrolyte derangements, ACS, pneumonia, viral illness such as COVID, influenza, RSV.  Considered PE but patient is not hypoxic, he is not tachycardic at this time, no unilateral calf swelling or tenderness, also is on blood thinners.  Will get labs, EKG, troponin, chest x-ray, respiratory viral panel.  Reassess.  Patient's presentation is most consistent with acute presentation with potential threat to life or bodily function.  On reassessment patient is without any chest pain or shortness of breath, no recurrence of palpitations.  EKG is nonischemic, chest x-ray without focal consolidation, labs are reassuring.  Shared decision making done with patient and wife and they are agreeable with the plan for discharge, instructed him to follow-up with his cardiologist outpatient.  Otherwise considered but no indication for  inpatient admission at this time, he is safe for outpatient management.  Will discharge with strict precautions.  Clinical Course as of 06/29/23 1117  Sun Jun 29, 2023  0943 DG Chest 2 View IMPRESSION: No active cardiopulmonary disease.   [TT]  1014 Independent review of labs and imaging, respiratory viral panel is negative, troponin is negative, no leukocytosis, electrolyte severely deranged, creatinine is normal. [TT]  1107 Troponin x 2 is negative. [TT]    Clinical Course User Index [TT] Jodie Echevaria, Franchot Erichsen, MD     FINAL CLINICAL IMPRESSION(S) / ED DIAGNOSES   Final diagnoses:  Atrial fibrillation, unspecified type (HCC)  Chest pain, unspecified type     Rx / DC Orders   ED Discharge Orders     None        Note:  This document was prepared using Dragon voice recognition software and may include unintentional dictation errors.    Claybon Jabs, MD 06/29/23 [phone]

## 2023-06-29 NOTE — ED Notes (Signed)
 Pt up tp the bathroom at this time. Gait steady. No assistance needed.

## 2023-06-30 ENCOUNTER — Telehealth: Payer: Self-pay | Admitting: Cardiology

## 2023-06-30 NOTE — Telephone Encounter (Signed)
 Patient called to let Dr. Kirke Corin and nurse know that he was in the hospital over the weekend. Also says that he would like to discuss some other issues with the nurse.

## 2023-06-30 NOTE — Telephone Encounter (Signed)
 Returned the call to the patient. He stated that he went to the ED last night for an episode of AFIB. He feels like this episode lasted close to 20 hours. He went in to Afib on Saturday night. He took Tartrate 25 mg and woke up the next day still in afib. He started having chest pain so went to the ED.   He stated that a few weeks ago that this happened and he was in afib for 8 hours. Normally, when he is in afib he will take 25 mg of the Tartrate and he will go  back to normal rhythm in a few hours. The last two episodes, the 25 mg of the Tartrate has not helped. Blood pressure during this episode was 96/75 and heart rate was 86.   He currently takes: Metoprolol Tartrate 12.5 mg bid Eliquis 5 mg bid  Second, the patient would also like for Dr. Kirke Corin to review his ABI results and give him recommendations.  Results: Study showed decreased flow to the toes. Pulses are palpable on exam.  Recommend walking for minimum of 30 minutes daily.  Will have primary cardiologist weight in on additional recommendations.

## 2023-07-03 MED ORDER — METOPROLOL TARTRATE 25 MG PO TABS: 25.0000 mg | ORAL_TABLET | Freq: Two times a day (BID) | ORAL | 1 refills | Status: DC

## 2023-07-03 NOTE — Telephone Encounter (Signed)
 Patient has been made aware. New prescription has been sent in. Appointment changed to Dr. Kirke Corin on 09/16/23  Iran Ouch, MD  You14 minutes ago (3:25 PM)    Recommend increasing metoprolol to 25 mg twice daily.  ABI showed calcified vessels overall but no clear evidence of obstructive disease.  His next follow-up visit should be with me so I can examine him.

## 2023-09-15 ENCOUNTER — Ambulatory Visit: Payer: Medicare HMO | Admitting: Cardiology

## 2023-09-16 ENCOUNTER — Ambulatory Visit: Payer: Medicare HMO | Attending: Cardiovascular Disease | Admitting: Cardiovascular Disease

## 2023-09-16 ENCOUNTER — Encounter: Payer: Self-pay | Admitting: Cardiovascular Disease

## 2023-09-16 VITALS — BP 120/82 | HR 79 | Ht 72.0 in | Wt 195.6 lb

## 2023-09-16 DIAGNOSIS — I700 Atherosclerosis of aorta: Secondary | ICD-10-CM | POA: Diagnosis not present

## 2023-09-16 DIAGNOSIS — G4733 Obstructive sleep apnea (adult) (pediatric): Secondary | ICD-10-CM

## 2023-09-16 DIAGNOSIS — I480 Paroxysmal atrial fibrillation: Secondary | ICD-10-CM

## 2023-09-16 DIAGNOSIS — E785 Hyperlipidemia, unspecified: Secondary | ICD-10-CM

## 2023-09-16 NOTE — Progress Notes (Signed)
 Cardiology Office Note   Date:  09/16/2023   ID:  Crue, Traugh [birth year], MRN [id number]  PCP:  Nestor Banter, MD  Cardiologist:   Antionette Kirks, MD   No chief complaint on file.     History of Present Illness: Donald Barry is a 78 y.o. male who is here today for follow-up visit regarding paroxysmal atrial fibrillation. 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 He was hospitalized in September 2022 with constipation and  pyelonephritis complicated by sepsis due to Enterobacter.  He had atrial fibrillation with RVR during that admission which was treated with rate control and subsequently converted to sinus rhythm.  He was initially not anticoagulated due to CHA2DS2-VASc score of 1 and other medical issues making him high risk for bleeding. He had an echocardiogram done during that admission which  showed low normal LV systolic function with an EF of 50 to 55%, calcified aortic valve without significant stenosis, normal diastolic function and dilated IVC.  He did have recurrent A-fib with RVR in the setting of having a sleep study done.   He underwent extensive GI work-up for blood loss anemia which has been nonrevealing.    He had recurrent A-fib with RVR in September of 2023 but was noted to be in sinus rhythm in the ED.  He had COVID just before that.  He was subsequently started on Eliquis  for anticoagulation.  He had a Lexiscan  Myoview  in September 2024 which showed no evidence of ischemia with normal ejection fraction.  No significant aortic or coronary calcifications.  He had an emergency room visit in February for palpitations and mild chest pain.  Troponin was normal.  He was noted to be in atrial fibrillation with ventricular rate of 80 bpm.  He reports that he stayed in atrial fibrillation for 12 hours and then went back to normal sinus rhythm.  He  was having issues with his CPAP due to congestion.  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 SCCis   Squamous cell skin cancer 07/2017   Resected from scalp.    Past Surgical History:  Procedure Laterality Date   BACK SURGERY  2008   L4, L5   COLONOSCOPY     cystectomy with prostatectomy ileal conduit     CYSTO WITH HYDRODISTENSION N/A 06/17/2019   Procedure: CYSTOSCOPY/HYDRODISTENSION;  Surgeon: Lawerence Pressman, MD;  Location: [facility] ORS;  Service: Urology;  Laterality: N/A;   CYSTOSCOPY WITH BIOPSY N/A 07/17/2018   Procedure: CYSTOSCOPY WITH BLADDER BIOPSY;  Surgeon: Lawerence Pressman, MD;  Location: [facility] ORS;  Service: Urology;  Laterality: N/A;   CYSTOSCOPY WITH BIOPSY N/A 06/17/2019   Procedure: CYSTOSCOPY WITH  BLADDER  BIOPSY;  Surgeon: Lawerence Pressman, MD;  Location: [facility] ORS;  Service: Urology;  Laterality: N/A;   CYSTOSCOPY WITH FULGERATION N/A 07/17/2018   Procedure: CYSTOSCOPY WITH FULGERATION;  Surgeon: Lawerence Pressman, MD;  Location: [facility] ORS;  Service: Urology;  Laterality: N/A;   CYSTOSCOPY WITH FULGERATION N/A 06/17/2019   Procedure: CYSTOSCOPY WITH FULGERATION;  Surgeon: Lawerence Pressman, MD;  Location: [facility] ORS;  Service: Urology;  Laterality: N/A;   ESOPHAGOGASTRODUODENOSCOPY     HERNIA REPAIR  2012   umbilical   STOMACH SURGERY      FUNDIC GLAND POLYP     Current Outpatient Medications  Medication Sig Dispense Refill   apixaban  (ELIQUIS ) 5 MG TABS tablet Take 1 tablet (5 mg total) by mouth 2 (two) times daily. 180 tablet 3   atorvastatin  (LIPITOR) 10 MG tablet Take 1 tablet by mouth daily.     Coenzyme Q10 (COQ10) 100 MG CAPS Take 1 capsule by mouth daily.     ferrous sulfate  325 (65 FE) MG EC tablet TAKE 1 TABLET BY MOUTH EVERY DAY (Patient taking differently: Take 1 tablet by mouth. Monday, Wednesday and Friday.) 90 tablet 1   fluticasone  (FLONASE ) 50 MCG/ACT nasal spray Place 1 spray into both nostrils daily.     guaiFENesin (MUCINEX) 600 MG 12 hr tablet Take by mouth 2 (two) times daily.     loratadine  (CLARITIN ) 10 MG tablet Take 1 tablet by mouth daily.     metoprolol  tartrate (LOPRESSOR ) 25 MG tablet Take 1 tablet (25 mg total) by mouth 2 (two) times daily. 90 tablet 1   Multiple Vitamin (MULTIVITAMIN WITH MINERALS) TABS tablet Take 1 tablet by mouth daily. Iron  Free     NON FORMULARY Place 1 Dose/kg into the nose daily as needed. Netty pot for sinuses     omeprazole (PRILOSEC) 40 MG capsule Take 1 capsule by mouth daily.     traZODone  (DESYREL ) 100 MG tablet Take 100 mg by mouth at bedtime.     No current facility-administered medications for this visit.    Allergies:   Tape, Wound dressing adhesive, Shellfish allergy, Shellfish-derived products, Methenamine, Onion, Other, and Codeine    Social History:  The patient  reports that he has never smoked. He has never used smokeless tobacco. He reports that he does not drink alcohol and does not use drugs.   Family History:  The patient's family history includes GER disease in his brother and sister; Heart disease in his father; Hypertension in his mother.    ROS:  Please see the history of present illness.   Otherwise, review of systems are positive for none.   All other systems are reviewed and negative.    PHYSICAL EXAM: VS:  BP 120/82   Pulse 79    Ht 6' (1.829 m)   Wt 195 lb 9.6 oz (88.7 kg)   SpO2 91%   BMI 26.53 kg/m  , BMI Body mass index is 26.53 kg/m. GEN: Well nourished, well developed, in no acute distress  HEENT: normal  Neck: no JVD, carotid bruits, or masses Cardiac: RRR; no murmurs, rubs, or gallops,no edema  Respiratory:  clear to auscultation bilaterally, normal work of breathing GI: soft, nontender, nondistended, + BS MS: no deformity or atrophy  Skin: warm and dry, no rash Neuro:  Strength and sensation are intact Psych: euthymic mood, full affect   EKG:  EKG is  ordered today. EKG showed : Normal sinus rhythm Normal ECG When compared with ECG of  29-Jun-2023 08:14, Sinus rhythm has replaced Atrial fibrillation    Recent Labs: 01/05/2023: ALT 22; TSH 4.689 06/29/2023: BUN 21; Creatinine, Ser 1.07; Hemoglobin 15.8; Magnesium  1.9; Platelets 158; Potassium 4.0; Sodium 142    Lipid Panel No results found for: "CHOL", "TRIG", "HDL", "CHOLHDL", "VLDL", "LDLCALC", "LDLDIRECT"    Wt Readings from Last 3 Encounters:  09/16/23 195 lb 9.6 oz (88.7 kg)  06/29/23 194 lb (88 kg)  05/15/23 204 lb 12.8 oz (92.9 kg)          06/15/2021    8:10 AM  PAD Screen  Previous PAD dx? No  Previous surgical procedure? Yes  Pain with walking? No  Feet/toe relief with dangling? No  Painful, non-healing ulcers? No  Extremities discolored? No      ASSESSMENT AND PLAN:  1.  Paroxysmal atrial fibrillation: Recent episode of atrial fibrillation in February.  He is back in normal sinus rhythm.  Continue metoprolol  and anticoagulation with Eliquis .  If his A-fib episodes become more frequent, will refer to EP to consider antiarrhythmic medication or ablation.    2.  Sleep apnea: He reports inability to tolerate CPAP sometimes due to congestion.  I am referring him to a sleep medicine specialist.  I suspect that undertreated sleep apnea is contributing to his episodes of atrial fibrillation.  3.  Hyperlipidemia: Currently  on atorvastatin .  Most recent lipid profile showed an LDL of 82.  4.  Aortic atherosclerosis: No claudication.    Disposition:   FU in 6 months. Signed,  Antionette Kirks, MD  09/16/2023 4:04 PM    [facility]

## 2023-09-16 NOTE — Patient Instructions (Addendum)
 Medication Instructions:  No changes *If you need a refill on your cardiac medications before your next appointment, please call your pharmacy*  Lab Work: None ordered If you have labs (blood work) drawn today and your tests are completely normal, you will receive your results only by: MyChart Message (if you have MyChart) OR A paper copy in the mail If you have any lab test that is abnormal or we need to change your treatment, we will call you to review the results.  Testing/Procedures: None ordered  Follow-Up: At [facility], you and your health needs are our priority.  As part of our continuing mission to provide you with exceptional heart care, our providers are all part of one team.  This team includes your primary Cardiologist (physician) and Advanced Practice Providers or APPs (Physician Assistants and Nurse Practitioners) who all work together to provide you with the care you need, when you need it.  Your next appointment:   6 month(s)  Provider:   You may see Antionette Kirks, MD or one of the following Advanced Practice Providers on your designated Care Team:   Laneta Pintos, NP Gildardo Labrador, PA-C Varney Gentleman, PA-C Cadence [place], PA-C Ronald Cockayne, NP Morey Ar, NP    A referral has been placed to Pulmonology-Dr. Kieran Pellet  We recommend signing up for the patient portal called "MyChart".  Sign up information is provided on this After Visit Summary.  MyChart is used to connect with patients for Virtual Visits (Telemedicine).  Patients are able to view lab/test results, encounter notes, upcoming appointments, etc.  Non-urgent messages can be sent to your provider as well.   To learn more about what you can do with MyChart, go to [email or web address].

## 2023-09-25 ENCOUNTER — Encounter: Payer: Self-pay | Admitting: Sleep Medicine

## 2023-09-25 ENCOUNTER — Ambulatory Visit: Admitting: Sleep Medicine

## 2023-09-25 VITALS — BP 90/60 | HR 68 | Temp 97.8°F | Ht 72.0 in | Wt 199.4 lb

## 2023-09-25 DIAGNOSIS — Z9981 Dependence on supplemental oxygen: Secondary | ICD-10-CM

## 2023-09-25 DIAGNOSIS — E785 Hyperlipidemia, unspecified: Secondary | ICD-10-CM

## 2023-09-25 DIAGNOSIS — G4733 Obstructive sleep apnea (adult) (pediatric): Secondary | ICD-10-CM

## 2023-09-25 NOTE — Progress Notes (Signed)
 Name:Donald Barry MRN: [id number] DOB: [birth year]   CHIEF COMPLAINT:  ESTABLISH CARE FOR OSA   HISTORY OF PRESENT ILLNESS:  Donald Barry is a 78 y.o. w/ a h/o OSA, hyperlipidemia, GERD and atrial fibrillation who presents to establish care for OSA. Reports that he was initially diagnosed with OSA around 5 years ago and subsequently started on CPAP therapy. States that he was switched to BIPAP therapy due to discomfort around 2 years ago. Reports using PAP therapy every night, which is confirmed by compliance data. He is currently using the [employer]plus FFM, which has caused significant skin irritation. Reports feeling unrefreshed upon awakening with PAP therapy.   Reports nocturnal awakenings due to mask leaks, occasionally has difficulty falling back to sleep. Denies any significant weight changes. Admits to dry mouth and headaches. Denies RLS symptoms, dream enactment, cataplexy, hypnagogic or hypnapompic hallucinations. Denies a family history of sleep apnea. Denies drowsy driving. Drinks 1 cup of coffee daily, denies alcohol, tobacco or illicit drug use.   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  has a past surgical history that includes Back surgery (2008); Stomach surgery; Colonoscopy; Esophagogastroduodenoscopy; Cystoscopy  with biopsy (N/A, 07/17/2018); Cystoscopy with fulgeration (N/A, 07/17/2018); Hernia repair (2012); Cystoscopy with biopsy (N/A, 06/17/2019); cysto with hydrodistension (N/A, 06/17/2019); Cystoscopy with fulgeration (N/A, 06/17/2019); and cystectomy with prostatectomy ileal conduit. Prior to Admission medications   Medication Sig Start Date End Date Taking? Authorizing Provider  acetaminophen  (TYLENOL ) 500 MG tablet Take 1,000 mg by mouth. 12/23/20  Yes [provider]  apixaban  (ELIQUIS ) 5 MG TABS tablet Take 1 tablet (5 mg total) by mouth 2 (two) times daily. 05/15/23  Yes Hammock, Sheri, NP  atorvastatin  (LIPITOR) 10 MG tablet Take 1 tablet by mouth daily. 08/06/19  Yes [provider]  Coenzyme Q10 (COQ10) 100 MG CAPS Take 1 capsule by mouth daily.   Yes [provider]  Cranberry-Vitamin C-Vitamin E (CRANBERRY PLUS VITAMIN C) 4200-20-3 MG-MG-UNIT CAPS Take 400 mg by mouth.   Yes [provider]  D-Mannose 350 MG CAPS Herbal Name: D-Mannose 1000mg  daily   Yes [provider]  diclofenac Sodium (VOLTAREN) 1 % GEL Apply 1 g topically.   Yes [provider]  ferrous sulfate  325 (65 FE) MG EC tablet TAKE 1 TABLET BY MOUTH EVERY DAY Patient taking differently: Take 1 tablet by mouth. Monday, Wednesday and Friday. 05/31/22  Yes Avonne Boettcher, MD  fluticasone  (FLONASE ) 50 MCG/ACT nasal spray Place 1 spray into both nostrils daily.   Yes [provider]  guaiFENesin (MUCINEX) 600 MG 12 hr tablet Take by mouth 2 (two) times daily.   Yes [provider]  Hypertonic Nasal Wash (SINUS RINSE) PACK Use as needed for clearing sinuses of congestion   Yes [provider]  loratadine  (CLARITIN ) 10 MG tablet Take 1 tablet by mouth daily.   Yes [provider]  meclizine (ANTIVERT) 25 MG tablet Take 1 tablet by mouth 3 (three) times daily as needed. 04/30/22  Yes [provider]  metoprolol  tartrate (LOPRESSOR ) 25 MG  tablet Take 1 tablet (25 mg total) by mouth 2 (two) times daily. 07/03/23  Yes Wenona Hamilton, MD  montelukast (SINGULAIR) 10 MG tablet Take 1 tablet by mouth at bedtime. 07/15/23 07/14/24 Yes [provider]  Multiple Vitamin (MULTIVITAMIN WITH MINERALS) TABS tablet Take 1 tablet by mouth daily. Iron  Free   Yes [provider]  NON FORMULARY Place 1 Dose/kg into the nose daily as needed. Netty pot for sinuses   Yes [provider]  omeprazole (PRILOSEC) 40 MG capsule Take 1 capsule by mouth daily. 10/25/19  Yes [provider]  traZODone  (DESYREL ) 100 MG tablet Take 100 mg by mouth at bedtime. 09/27/22  Yes [provider]   Allergies  Allergen Reactions   Tape Dermatitis    Skin blistered- steri strip  Skin blistered   Wound Dressing Adhesive Dermatitis    Skin blistered- steri strip   Shellfish Allergy Diarrhea and Nausea And Vomiting    Shrimp    Shellfish-Derived Products Diarrhea, Nausea And Vomiting and Nausea Only   Silicone Dermatitis    CPAP mask material breaks him out into a whelps and blisters.   Methenamine Diarrhea and Nausea And Vomiting   Onion Other (See Comments)    diarrhea Diarrhea- verified avoids all onion ingredient (including onion powder). MM, RD 12/19/20   Other Nausea And Vomiting    general anesthesia   Codeine Nausea Only    FAMILY HISTORY:  family history includes GER disease in his brother and sister; Heart disease in his father; Hypertension in his mother. SOCIAL HISTORY:  reports that he has never smoked. He has never used smokeless tobacco. He reports that he does not drink alcohol and does not use drugs.   Review of Systems:  Gen:  Denies  fever, sweats, chills weight loss  HEENT: Denies blurred vision, double vision, ear pain, eye pain, hearing loss, nose bleeds, sore throat Cardiac:  No dizziness, chest pain or heaviness, chest tightness,edema, No JVD Resp:   No cough, -sputum production, -shortness  of breath,-wheezing, -hemoptysis,  Gi: Denies swallowing difficulty, stomach pain, nausea or vomiting, diarrhea, constipation, bowel incontinence Gu:  Denies bladder incontinence, burning urine Ext:   Denies Joint pain, stiffness or swelling Skin: Denies  skin rash, easy bruising or bleeding or hives Endoc:  Denies polyuria, polydipsia , polyphagia or weight change Psych:   Denies depression, insomnia or hallucinations  Other:  All other systems negative  VITAL SIGNS: BP 90/60 (BP Location: Right Arm, Patient Position: Sitting, Cuff Size: Normal)   Pulse 68   Temp 97.8 F (36.6 C) (Oral)   Ht 6' (1.829 m)   Wt 199 lb 6.4 oz (90.4 kg)   SpO2 99%   BMI 27.04 kg/m    Physical Examination:   General Appearance: No distress  EYES PERRLA, EOM intact.   NECK Supple, No JVD Pulmonary: normal breath sounds, No wheezing.  CardiovascularNormal S1,S2.  No m/r/g.   Abdomen: Benign, Soft, non-tender. Skin:   warm, no rashes, no ecchymosis  Extremities: normal, no cyanosis, clubbing. Neuro:without focal findings,  speech normal  PSYCHIATRIC: Mood, affect within normal limits.   ASSESSMENT AND PLAN  OSA Patient is using BIPAP therapy, however AHI is elevated at 20. Will change settings to auto BIPAP, min EPAP 4 cm H2O and max IPAP 18 cm H2O. For  mask discomfort, will try patient on the Airfit F40 FFM. For skin irritation, recommended trying Aquaphor. Discussed the consequences of untreated sleep apnea. Advised not to drive drowsy for safety of patient and others. Will follow complete further evaluation with a home sleep study and follow up to review results.    Hyperlipidemia Stable, on current management. Following with PCP.    Patient  satisfied with Plan of action and management. All questions answered  I spent a total of 45 minutes reviewing chart data, face-to-face evaluation with the patient, counseling and coordination of care as detailed above.    Tanisha Lutes, M.D.  Sleep  Medicine [facility]

## 2023-09-25 NOTE — Patient Instructions (Signed)
 Will change BIPAP pressure setting to auto BIPAP. For skin irritation, can try using Aquaphor ointment on affected areas. Will follow up in 4 weeks.

## 2023-10-19 ENCOUNTER — Inpatient Hospital Stay
Admission: EM | Admit: 2023-10-19 | Discharge: 2023-10-23 | DRG: 698 | Disposition: A | Discharge status: Home / Self Care | Attending: Student | Admitting: Student

## 2023-10-19 ENCOUNTER — Emergency Department

## 2023-10-19 ENCOUNTER — Other Ambulatory Visit: Payer: Self-pay

## 2023-10-19 DIAGNOSIS — A4150 Gram-negative sepsis, unspecified: Secondary | ICD-10-CM | POA: Diagnosis present

## 2023-10-19 DIAGNOSIS — Y732 Prosthetic and other implants, materials and accessory gastroenterology and urology devices associated with adverse incidents: Secondary | ICD-10-CM | POA: Diagnosis present

## 2023-10-19 DIAGNOSIS — K219 Gastro-esophageal reflux disease without esophagitis: Secondary | ICD-10-CM | POA: Diagnosis present

## 2023-10-19 DIAGNOSIS — Z936 Other artificial openings of urinary tract status: Secondary | ICD-10-CM | POA: Diagnosis not present

## 2023-10-19 DIAGNOSIS — E785 Hyperlipidemia, unspecified: Secondary | ICD-10-CM | POA: Diagnosis present

## 2023-10-19 DIAGNOSIS — B961 Klebsiella pneumoniae [K. pneumoniae] as the cause of diseases classified elsewhere: Secondary | ICD-10-CM | POA: Diagnosis present

## 2023-10-19 DIAGNOSIS — Z8249 Family history of ischemic heart disease and other diseases of the circulatory system: Secondary | ICD-10-CM | POA: Diagnosis not present

## 2023-10-19 DIAGNOSIS — Z906 Acquired absence of other parts of urinary tract: Secondary | ICD-10-CM

## 2023-10-19 DIAGNOSIS — Z7901 Long term (current) use of anticoagulants: Secondary | ICD-10-CM | POA: Diagnosis not present

## 2023-10-19 DIAGNOSIS — A419 Sepsis, unspecified organism: Secondary | ICD-10-CM | POA: Diagnosis not present

## 2023-10-19 DIAGNOSIS — E538 Deficiency of other specified B group vitamins: Secondary | ICD-10-CM | POA: Diagnosis present

## 2023-10-19 DIAGNOSIS — I480 Paroxysmal atrial fibrillation: Secondary | ICD-10-CM | POA: Diagnosis present

## 2023-10-19 DIAGNOSIS — T83598A Infection and inflammatory reaction due to other prosthetic device, implant and graft in urinary system, initial encounter: Principal | ICD-10-CM | POA: Diagnosis present

## 2023-10-19 DIAGNOSIS — E875 Hyperkalemia: Secondary | ICD-10-CM | POA: Diagnosis present

## 2023-10-19 DIAGNOSIS — Z91013 Allergy to seafood: Secondary | ICD-10-CM | POA: Diagnosis not present

## 2023-10-19 DIAGNOSIS — Z1612 Extended spectrum beta lactamase (ESBL) resistance: Secondary | ICD-10-CM | POA: Diagnosis present

## 2023-10-19 DIAGNOSIS — N12 Tubulo-interstitial nephritis, not specified as acute or chronic: Secondary | ICD-10-CM | POA: Diagnosis present

## 2023-10-19 DIAGNOSIS — R6520 Severe sepsis without septic shock: Secondary | ICD-10-CM | POA: Diagnosis present

## 2023-10-19 DIAGNOSIS — B964 Proteus (mirabilis) (morganii) as the cause of diseases classified elsewhere: Secondary | ICD-10-CM | POA: Diagnosis present

## 2023-10-19 DIAGNOSIS — N136 Pyonephrosis: Secondary | ICD-10-CM | POA: Diagnosis present

## 2023-10-19 DIAGNOSIS — Z79899 Other long term (current) drug therapy: Secondary | ICD-10-CM

## 2023-10-19 DIAGNOSIS — Z87440 Personal history of urinary (tract) infections: Secondary | ICD-10-CM

## 2023-10-19 DIAGNOSIS — N390 Urinary tract infection, site not specified: Secondary | ICD-10-CM | POA: Diagnosis not present

## 2023-10-19 DIAGNOSIS — K8020 Calculus of gallbladder without cholecystitis without obstruction: Secondary | ICD-10-CM | POA: Diagnosis present

## 2023-10-19 DIAGNOSIS — Z85828 Personal history of other malignant neoplasm of skin: Secondary | ICD-10-CM

## 2023-10-19 LAB — CBC WITH DIFFERENTIAL/PLATELET
Abs Immature Granulocytes: 0.04 10*3/uL (ref 0.00–0.07)
Basophils Absolute: 0 10*3/uL (ref 0.0–0.1)
Basophils Relative: 0 %
Eosinophils Absolute: 0 10*3/uL (ref 0.0–0.5)
Eosinophils Relative: 0 %
HCT: 41.7 % (ref 39.0–52.0)
Hemoglobin: 14.7 g/dL (ref 13.0–17.0)
Immature Granulocytes: 0 %
Lymphocytes Relative: 5 %
Lymphs Abs: 0.5 10*3/uL — ABNORMAL LOW (ref 0.7–4.0)
MCH: 32.6 pg (ref 26.0–34.0)
MCHC: 35.3 g/dL (ref 30.0–36.0)
MCV: 92.5 fL (ref 80.0–100.0)
Monocytes Absolute: 0.4 10*3/uL (ref 0.1–1.0)
Monocytes Relative: 4 %
Neutro Abs: 10.9 10*3/uL — ABNORMAL HIGH (ref 1.7–7.7)
Neutrophils Relative %: 91 %
Platelets: 152 10*3/uL (ref 150–400)
RBC: 4.51 MIL/uL (ref 4.22–5.81)
RDW: 12.8 % (ref 11.5–15.5)
WBC: 12 10*3/uL — ABNORMAL HIGH (ref 4.0–10.5)
nRBC: 0 % (ref 0.0–0.2)

## 2023-10-19 LAB — URINALYSIS, ROUTINE W REFLEX MICROSCOPIC
Bilirubin Urine: NEGATIVE
Glucose, UA: NEGATIVE mg/dL
Ketones, ur: NEGATIVE mg/dL
Nitrite: NEGATIVE
Protein, ur: 100 mg/dL — AB
RBC / HPF: >50 RBC/hpf (ref 0–5)
Specific Gravity, Urine: 1.013 (ref 1.005–1.030)
Squamous Epithelial / HPF: 0 /HPF (ref 0–5)
WBC, UA: >50 WBC/hpf (ref 0–5)
pH: 8 (ref 5.0–8.0)

## 2023-10-19 LAB — LIPASE, BLOOD: Lipase: 66 U/L — ABNORMAL HIGH (ref 11–51)

## 2023-10-19 LAB — TROPONIN I (HIGH SENSITIVITY)
Troponin I (High Sensitivity): 4 ng/L (ref <18)
Troponin I (High Sensitivity): 5 ng/L (ref <18)

## 2023-10-19 LAB — LACTIC ACID, PLASMA
Lactic Acid, Venous: 2.1 mmol/L (ref 0.5–1.9)
Lactic Acid, Venous: 1.1 mmol/L (ref 0.5–1.9)

## 2023-10-19 LAB — COMPREHENSIVE METABOLIC PANEL WITH GFR
ALT: 17 U/L (ref 0–44)
AST: 30 U/L (ref 15–41)
Albumin: 4.1 g/dL (ref 3.5–5.0)
Alkaline Phosphatase: 29 U/L — ABNORMAL LOW (ref 38–126)
Anion gap: 13 (ref 5–15)
BUN: 18 mg/dL (ref 8–23)
CO2: 20 mmol/L — ABNORMAL LOW (ref 22–32)
Calcium: 9 mg/dL (ref 8.9–10.3)
Chloride: 106 mmol/L (ref 98–111)
Creatinine, Ser: 1.03 mg/dL (ref 0.61–1.24)
GFR, Estimated: >60 mL/min (ref >60)
Glucose, Bld: 114 mg/dL — ABNORMAL HIGH (ref 70–99)
Potassium: 3.9 mmol/L (ref 3.5–5.1)
Sodium: 139 mmol/L (ref 135–145)
Total Bilirubin: 0.8 mg/dL (ref 0.0–1.2)
Total Protein: 7.2 g/dL (ref 6.5–8.1)

## 2023-10-19 MED ORDER — FLUTICASONE PROPIONATE 50 MCG/ACT NA SUSP
1.0000 | Freq: Every day | NASAL | Status: DC | PRN
  Administered 2023-10-23: 1 via NASAL
  Filled 2023-10-19: qty 16

## 2023-10-19 MED ORDER — METOPROLOL TARTRATE 5 MG/5ML IV SOLN: 5.0000 mg | Freq: Four times a day (QID) | INTRAVENOUS | Status: DC | PRN

## 2023-10-19 MED ORDER — IOHEXOL 350 MG/ML SOLN
100.0000 mL | Freq: Once | INTRAVENOUS | Status: AC | PRN
  Administered 2023-10-19: 100 mL via INTRAVENOUS

## 2023-10-19 MED ORDER — ONDANSETRON HCL 4 MG/2ML IJ SOLN
4.0000 mg | Freq: Four times a day (QID) | INTRAMUSCULAR | Status: DC | PRN
  Administered 2023-10-20: 4 mg via INTRAVENOUS
  Filled 2023-10-19: qty 2

## 2023-10-19 MED ORDER — HYDROMORPHONE HCL 1 MG/ML IJ SOLN
0.5000 mg | Freq: Once | INTRAMUSCULAR | Status: AC
  Administered 2023-10-19: 0.5 mg via INTRAVENOUS
  Filled 2023-10-19: qty 0.5

## 2023-10-19 MED ORDER — ONDANSETRON HCL 4 MG/2ML IJ SOLN
4.0000 mg | Freq: Once | INTRAMUSCULAR | Status: AC
  Administered 2023-10-19: 4 mg via INTRAVENOUS
  Filled 2023-10-19: qty 2

## 2023-10-19 MED ORDER — ONDANSETRON HCL 4 MG PO TABS: 4.0000 mg | ORAL_TABLET | Freq: Four times a day (QID) | ORAL | Status: DC | PRN

## 2023-10-19 MED ORDER — ALBUTEROL SULFATE (2.5 MG/3ML) 0.083% IN NEBU: 2.5000 mg | INHALATION_SOLUTION | RESPIRATORY_TRACT | Status: DC | PRN

## 2023-10-19 MED ORDER — ACETAMINOPHEN 325 MG PO TABS
650.0000 mg | ORAL_TABLET | Freq: Four times a day (QID) | ORAL | Status: DC | PRN
  Administered 2023-10-20 – 2023-10-23 (×5): 650 mg via ORAL
  Filled 2023-10-19 (×5): qty 2

## 2023-10-19 MED ORDER — SODIUM CHLORIDE 0.9 % IV BOLUS
1000.0000 mL | Freq: Once | INTRAVENOUS | Status: AC
  Administered 2023-10-19: 1000 mL via INTRAVENOUS

## 2023-10-19 MED ORDER — GUAIFENESIN ER 600 MG PO TB12
600.0000 mg | ORAL_TABLET | Freq: Two times a day (BID) | ORAL | Status: DC | PRN
  Administered 2023-10-21 – 2023-10-23 (×4): 600 mg via ORAL
  Filled 2023-10-19 (×5): qty 1

## 2023-10-19 MED ORDER — METOPROLOL TARTRATE 25 MG PO TABS
25.0000 mg | ORAL_TABLET | Freq: Two times a day (BID) | ORAL | Status: DC
  Administered 2023-10-19: 25 mg via ORAL
  Filled 2023-10-19: qty 1

## 2023-10-19 MED ORDER — APIXABAN 5 MG PO TABS
5.0000 mg | ORAL_TABLET | Freq: Two times a day (BID) | ORAL | Status: DC
  Administered 2023-10-19 – 2023-10-23 (×8): 5 mg via ORAL
  Filled 2023-10-19 (×8): qty 1

## 2023-10-19 MED ORDER — SODIUM CHLORIDE 0.9 % IV SOLN
2.0000 g | INTRAVENOUS | Status: DC
  Administered 2023-10-20: 2 g via INTRAVENOUS
  Filled 2023-10-19: qty 20

## 2023-10-19 MED ORDER — SODIUM CHLORIDE 0.9 % IV BOLUS
500.0000 mL | Freq: Once | INTRAVENOUS | Status: AC
  Administered 2023-10-19: 500 mL via INTRAVENOUS

## 2023-10-19 MED ORDER — LACTATED RINGERS IV SOLN: INTRAVENOUS | Status: AC

## 2023-10-19 MED ORDER — ACETAMINOPHEN 650 MG RE SUPP: 650.0000 mg | Freq: Four times a day (QID) | RECTAL | Status: DC | PRN

## 2023-10-19 MED ORDER — TRAZODONE HCL 50 MG PO TABS
100.0000 mg | ORAL_TABLET | Freq: Every day | ORAL | Status: DC
  Administered 2023-10-19: 50 mg via ORAL
  Administered 2023-10-20: 100 mg via ORAL
  Filled 2023-10-19 (×2): qty 2

## 2023-10-19 MED ORDER — ATORVASTATIN CALCIUM 20 MG PO TABS
10.0000 mg | ORAL_TABLET | Freq: Every day | ORAL | Status: DC
  Administered 2023-10-20 – 2023-10-23 (×4): 10 mg via ORAL
  Filled 2023-10-19 (×4): qty 1

## 2023-10-19 MED ORDER — METOPROLOL TARTRATE 25 MG PO TABS
25.0000 mg | ORAL_TABLET | Freq: Two times a day (BID) | ORAL | Status: DC
  Filled 2023-10-19: qty 1

## 2023-10-19 MED ORDER — SODIUM CHLORIDE 0.9 % IV SOLN
2.0000 g | Freq: Once | INTRAVENOUS | Status: AC
  Administered 2023-10-19: 2 g via INTRAVENOUS
  Filled 2023-10-19: qty 12.5

## 2023-10-19 NOTE — ED Triage Notes (Signed)
 Pt to ed from home via POV for abd pain and emesis. Pt is alert, skaking and dry heaving.

## 2023-10-19 NOTE — Progress Notes (Signed)
 Pt being followed by [facility] for Sepsis protocol.

## 2023-10-19 NOTE — ED Notes (Signed)
 Blood cultures were collected before antibiotics and fluids were started

## 2023-10-19 NOTE — H&P (Incomplete)
 History and Physical    Patient: Donald Barry:[id number] DOB: October 26, 1945 DOA: 10/19/2023 DOS: the patient was seen and examined on 10/19/2023 PCP: Nestor Banter, MD  Patient coming from: Home  Chief Complaint:  Chief Complaint  Patient presents with   Emesis   HPI: Donald Barry is a 78 y.o. 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  He follows up with [facility] for his urologic needs.  He presents to the emergency room on account of intractable left-sided abdominal pain, nausea and vomiting.  Onset of symptoms since about 3 PM today.  Denied any aggravating or relieving factors.  Denied any associated fever.  Denies seeing any cloudy urine.  On admission however workup revealed abnormal urinalysis consistent with UTI.  CT abdomen and pelvis ruled reviewed.  No large vessel occlusive disease.   Left kidney demonstrates decreased enhancement compared to the right with significant hydronephrosis and hydroureter which extends to the anastomotic site of the ileal conduit.  No definitive calculus was seen however.  Perinephric stranding was noted.  Patient was admitted with concern for possible pyelonephritis.  Review of Systems: As mentioned in the history of present illness. All other systems reviewed and are negative. Past Medical History:  Diagnosis Date   A-fib [facility])    Actinic keratosis    Hx of PDT Tx., face 1/252021 and scalp 07/05/2019   Allergic rhinitis, seasonal 06/14/2014   Allergy    Arthritis    Chronic neck pain 06/14/2014   Complication of anesthesia    Gastroesophageal reflux disease without esophagitis 06/14/2014   Headache    h/o migraines   Hepatic steatosis 04/05/2018   Hiatal hernia 09/09/2017   Intermittent left lower quadrant abdominal pain 07/15/2017   PONV  (postoperative nausea and vomiting)    Sleep apnea    cpap   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 SCCis   Squamous cell skin cancer 07/2017   Resected from scalp.   Past Surgical History:  Procedure Laterality Date   BACK SURGERY  2008   L4, L5   COLONOSCOPY     cystectomy with prostatectomy ileal conduit     CYSTO WITH HYDRODISTENSION N/A 06/17/2019   Procedure: CYSTOSCOPY/HYDRODISTENSION;  Surgeon: Lawerence Pressman, MD;  Location: [facility] ORS;  Service: Urology;  Laterality: N/A;   CYSTOSCOPY WITH BIOPSY N/A 07/17/2018   Procedure: CYSTOSCOPY WITH BLADDER BIOPSY;  Surgeon: Lawerence Pressman, MD;  Location: [facility] ORS;  Service: Urology;  Laterality: N/A;   CYSTOSCOPY WITH BIOPSY N/A 06/17/2019   Procedure: CYSTOSCOPY WITH BLADDER  BIOPSY;  Surgeon: Lawerence Pressman, MD;  Location: [facility] ORS;  Service: Urology;  Laterality: N/A;   CYSTOSCOPY WITH FULGERATION N/A 07/17/2018   Procedure: CYSTOSCOPY WITH FULGERATION;  Surgeon: Lawerence Pressman, MD;  Location: [facility] ORS;  Service: Urology;  Laterality: N/A;   CYSTOSCOPY WITH FULGERATION N/A 06/17/2019   Procedure: CYSTOSCOPY WITH FULGERATION;  Surgeon: Lawerence Pressman, MD;  Location: [facility] ORS;  Service: Urology;  Laterality: N/A;   ESOPHAGOGASTRODUODENOSCOPY     HERNIA REPAIR  2012   umbilical   STOMACH SURGERY     FUNDIC GLAND POLYP   Social History:  reports that he has never smoked. He has  never used smokeless tobacco. He reports that he does not drink alcohol and does not use drugs.  Allergies  Allergen Reactions   Tape Dermatitis    Skin blistered- steri strip  Skin blistered   Wound Dressing Adhesive Dermatitis    Skin blistered- steri strip   Shellfish Allergy Diarrhea and Nausea And Vomiting    Shrimp    Shellfish-Derived Products Diarrhea, Nausea And Vomiting and Nausea Only   Silicone Dermatitis    CPAP mask material  breaks him out into a whelps and blisters.   Methenamine Diarrhea and Nausea And Vomiting   Onion Other (See Comments)    diarrhea Diarrhea- verified avoids all onion ingredient (including onion powder). MM, RD 12/19/20   Other Nausea And Vomiting    general anesthesia   Codeine Nausea Only    Family History  Problem Relation Age of Onset   Hypertension Mother    Heart disease Father    GER disease Sister    GER disease Brother    Prostate cancer Neg Hx    Kidney cancer Neg Hx     Prior to Admission medications   Medication Sig Start Date End Date Taking? Authorizing Provider  acetaminophen  (TYLENOL ) 500 MG tablet Take 1,000 mg by mouth. 12/23/20   [provider]  apixaban  (ELIQUIS ) 5 MG TABS tablet Take 1 tablet (5 mg total) by mouth 2 (two) times daily. 05/15/23   Ronald Cockayne, NP  atorvastatin  (LIPITOR) 10 MG tablet Take 1 tablet by mouth daily. 08/06/19   [provider]  Coenzyme Q10 (COQ10) 100 MG CAPS Take 1 capsule by mouth daily.    [provider]  Cranberry-Vitamin C-Vitamin E (CRANBERRY PLUS VITAMIN C) 4200-20-3 MG-MG-UNIT CAPS Take 400 mg by mouth.    [provider]  D-Mannose 350 MG CAPS Herbal Name: D-Mannose 1000mg  daily    [provider]  diclofenac Sodium (VOLTAREN) 1 % GEL Apply 1 g topically.    [provider]  ferrous sulfate  325 (65 FE) MG EC tablet TAKE 1 TABLET BY MOUTH EVERY DAY Patient taking differently: Take 1 tablet by mouth. Monday, Wednesday and Friday. 05/31/22   Avonne Boettcher, MD  fluticasone  (FLONASE ) 50 MCG/ACT nasal spray Place 1 spray into both nostrils daily.    [provider]  guaiFENesin (MUCINEX) 600 MG 12 hr tablet Take by mouth 2 (two) times daily.    [provider]  Hypertonic Nasal Wash (SINUS RINSE) PACK Use as needed for clearing sinuses of congestion    [provider]  loratadine  (CLARITIN ) 10 MG tablet Take 1 tablet by mouth daily.    [provider]  meclizine (ANTIVERT) 25 MG tablet Take 1 tablet by mouth 3 (three) times daily as needed. 04/30/22   [provider]  metoprolol  tartrate (LOPRESSOR ) 25 MG tablet Take 1 tablet (25 mg total) by mouth 2 (two) times daily. 07/03/23   Wenona Hamilton, MD  montelukast (SINGULAIR) 10 MG tablet Take 1 tablet by mouth at bedtime. 07/15/23 07/14/24  [provider]  Multiple Vitamin (MULTIVITAMIN WITH MINERALS) TABS tablet Take 1 tablet by mouth daily. Iron  Free    [provider]  NON FORMULARY Place 1 Dose/kg into the nose daily as needed. Netty pot for sinuses    [provider]  omeprazole (PRILOSEC) 40 MG capsule Take 1 capsule by mouth daily. 10/25/19   [provider]  traZODone  (DESYREL ) 100 MG tablet Take 100 mg by mouth at bedtime. 09/27/22  [provider]    Physical Exam: Vitals:   10/19/23 1909 10/19/23 2105 10/19/23 2146  BP: 118/78  119/82  Pulse: (!) 130  (!) 103  Resp: 20  17  Temp: 98.3 F (36.8 C)    TempSrc: Oral    SpO2: 98% 93% 99%   General: Pleasant gentleman laying comfortably in bed. HEENT: Head is atraumatic pupils equal round reactive to light.  Patient wears glasses.  He is anicteric and not pale. Neck: Supple with no JVD Chest: Clinically clear with no added sounds. Abdomen: Soft nontender.  Right lower quadrant ileal conduit.  Urostomy bag in place.  Brown cloudy urine.  Mild guarding on left lower quadrant. CNS shows no focal deficits CVS: Regular S1-S2.  No added sounds.  Telemetry shows sinus rhythm. Skin: Negative for any new rash. Data Reviewed: Sodium is 139, potassium 3.9, chloride 106, bicarb 20, glucose 114, BUN 18, creatinine 1.01, AST 30, ALT 17 2000 globin 0.8.  Lactic acid was 2.1.  WBCs 12, hemoglobin 14.7, hematocrit 42, platelet count 152, neutrophil count 91, platelet count 152, glucose 114  EKG shows sinus tachycardia:  Assessment and Plan: 78 year old with complex urologic  history status post cystectomy, with ileostomy conduit.  She presents with left-sided abdominal pain with associated nausea and vomiting.  Labs significant for elevated lactic acid, abnormal urinalysis and CT scan findings of left perinephric stranding.  Acute complex urinary tract infection with sepsis: Patient with history of ileostomy conduit.  CT scan with left-sided hydronephrosis.  New findings of perinephric stranding.  Patient with elevated lactic acidosis.  Patient will be empirically covered with IV antibiotics.  Blood cultures and urine cultures pending.  Paroxysmal atrial fibrillation: Sinus tachycardia on admission.  Likely due to sepsis.  Improved with IV fluids.  Resumed on metoprolol .   History of sleep apnea: Patient is on CPAP.  Hyperlipidemia:    Advance Care Planning:   Code Status: Full Code   Consults: Urology on call  Family Communication: ***  Severity of Illness: {Observation/Inpatient:21159}  Author: Theodora Fish, MD 10/19/2023 9:54 PM  For on call review [email or web address].

## 2023-10-19 NOTE — ED Notes (Addendum)
 Pt is sleeping. This RN confirmed with family that he sleeps with a B-Pap machine. Pt's O2 sat was 88% while sleeping. This RN placed pt on nasal canula 2L. O2 has improved to 94%. MD Johnetta Nab notified

## 2023-10-19 NOTE — ED Provider Notes (Signed)
 [facility] Provider Note    Event Date/Time   First MD Initiated Contact with Patient 10/19/23 1907     (approximate)   History   No chief complaint on file.   HPI  Donald Barry is a 78 y.o. male with history of A-fib on Eliquis  who comes in with left lower quadrant pain.  Patient has a history of IC s/p cystectomy and conduit.  He was previously on Bactrim for a year for UTI prophylaxis but they had to discontinue it secondary to hyperkalemia.  He comes in today with severe left-sided abdominal pain that was acute in onset associate with some nausea, vomiting.  He does report history of diverticulitis as well.  Does report seeing a little bit of blood tinge in his urine.   Physical Exam   Triage Vital Signs: ED Triage Vitals  Encounter Vitals Group     BP      Systolic BP Percentile      Diastolic BP Percentile      Pulse      Resp      Temp      Temp src      SpO2      Weight      Height      Head Circumference      Peak Flow      Pain Score      Pain Loc      Pain Education      Exclude from Growth Chart     Most recent vital signs: Vitals:   10/19/23 2105 10/19/23 2146  BP:  119/82  Pulse:  (!) 103  Resp:  17  Temp:    SpO2: 93% 99%     General: Awake, no distress.  CV:  Good peripheral perfusion.  Resp:  Normal effort.  Abd:  No distention.  Tenderness in the left lower quadrant without any rebound or guarding.  Urine ostomy noted with normal colored urine Other:     ED Results / Procedures / Treatments   Labs (all labs ordered are listed, but only abnormal results are displayed) Labs Reviewed  CBC WITH DIFFERENTIAL/PLATELET - Abnormal; Notable for the following components:      Result Value   WBC 12.0 (*)    Neutro Abs 10.9 (*)    Lymphs Abs 0.5 (*)    All other components within normal limits  COMPREHENSIVE METABOLIC PANEL WITH GFR - Abnormal; Notable for the following components:   CO2 20 (*)    Glucose,  Bld 114 (*)    Alkaline Phosphatase 29 (*)    All other components within normal limits  LIPASE, BLOOD - Abnormal; Notable for the following components:   Lipase 66 (*)    All other components within normal limits  LACTIC ACID, PLASMA - Abnormal; Notable for the following components:   Lactic Acid, Venous 2.1 (*)    All other components within normal limits  URINALYSIS, ROUTINE W REFLEX MICROSCOPIC - Abnormal; Notable for the following components:   Color, Urine YELLOW (*)    APPearance CLOUDY (*)    Hgb urine dipstick SMALL (*)    Protein, ur 100 (*)    Leukocytes,Ua LARGE (*)    Bacteria, UA FEW (*)    All other components within normal limits  CULTURE, BLOOD (ROUTINE X 2)  CULTURE, BLOOD (ROUTINE X 2)  LACTIC ACID, PLASMA  BASIC METABOLIC PANEL WITH GFR  CBC  TROPONIN I (HIGH SENSITIVITY)  TROPONIN  I (HIGH SENSITIVITY)     EKG  My interpretation of EKG:  Sinus tachycardia rate of 111 without any ST elevation or T wave inversions, normal intervals  RADIOLOGY I have reviewed the CT personally interpreted no obvious kidney stone   PROCEDURES:  Critical Care performed: Yes, see critical care procedure note(s)  .1-3 Lead EKG Interpretation  Performed by: Lubertha Rush, MD Authorized by: Lubertha Rush, MD     Interpretation: abnormal     ECG rate:  100   ECG rate assessment: tachycardic     Rhythm: sinus tachycardia     Ectopy: none     Conduction: normal   .Critical Care  Performed by: Lubertha Rush, MD Authorized by: Lubertha Rush, MD   Critical care provider statement:    Critical care time (minutes):  30   Critical care was necessary to treat or prevent imminent or life-threatening deterioration of the following conditions:  Sepsis   Critical care was time spent personally by me on the following activities:  Development of treatment plan with patient or surrogate, discussions with consultants, evaluation of patient's response to treatment, examination of  patient, ordering and review of laboratory studies, ordering and review of radiographic studies, ordering and performing treatments and interventions, pulse oximetry, re-evaluation of patient's condition and review of old charts    MEDICATIONS ORDERED IN ED: Medications  ceFEPIme  (MAXIPIME ) 2 g in sodium chloride  0.9 % 100 mL IVPB (2 g Intravenous New Bag/Given 10/19/23 2125)  atorvastatin  (LIPITOR) tablet 10 mg (has no administration in time range)  metoprolol  tartrate (LOPRESSOR ) tablet 25 mg (has no administration in time range)  traZODone  (DESYREL ) tablet 100 mg (has no administration in time range)  apixaban  (ELIQUIS ) tablet 5 mg (has no administration in time range)  fluticasone  (FLONASE ) 50 MCG/ACT nasal spray 1 spray (has no administration in time range)  guaiFENesin (MUCINEX) 12 hr tablet 600 mg (has no administration in time range)  lactated ringers  infusion (has no administration in time range)  acetaminophen  (TYLENOL ) tablet 650 mg (has no administration in time range)    Or  acetaminophen  (TYLENOL ) suppository 650 mg (has no administration in time range)  ondansetron  (ZOFRAN ) tablet 4 mg (has no administration in time range)    Or  ondansetron  (ZOFRAN ) injection 4 mg (has no administration in time range)  albuterol (PROVENTIL) (2.5 MG/3ML) 0.083% nebulizer solution 2.5 mg (has no administration in time range)  metoprolol  tartrate (LOPRESSOR ) injection 5 mg (has no administration in time range)  cefTRIAXone  (ROCEPHIN ) 2 g in sodium chloride  0.9 % 100 mL IVPB (has no administration in time range)  HYDROmorphone  (DILAUDID ) injection 0.5 mg (0.5 mg Intravenous Given 10/19/23 1930)  ondansetron  (ZOFRAN ) injection 4 mg (4 mg Intravenous Given 10/19/23 1930)  sodium chloride  0.9 % bolus 500 mL (0 mLs Intravenous Stopped 10/19/23 2048)  iohexol  (OMNIPAQUE ) 350 MG/ML injection 100 mL (100 mLs Intravenous Contrast Given 10/19/23 2031)  sodium chloride  0.9 % bolus 1,000 mL (0 mLs Intravenous  Stopped 10/19/23 2149)  HYDROmorphone  (DILAUDID ) injection 0.5 mg (0.5 mg Intravenous Given 10/19/23 2129)     IMPRESSION / MDM / ASSESSMENT AND PLAN / ED COURSE  I reviewed the triage vital signs and the nursing notes.   Patient's presentation is most consistent with acute presentation with potential threat to life or bodily function.   Patient presents with severe left lower quadrant abdominal pain differential includes diverticulitis, pyelonephritis, kidney stone however given patient's history of A-fib and sudden onset of it  I also consider the possibility of ischemic colitis and will proceed with CT angio to further evaluate.  Workup shows elevated white count patient meets sepsis criteria.  Urine concerning for UTI but it is off of the urine bag.  CT imaging however shows new stranding along the left kidney which is where his symptoms are so I do suspect this is probably pyelonephritis secondary to UTI.   Patient still having discomfort will discuss with the hospital team for admission for sepsis from pyeloneprhtisi  Patient started off with 1-1/2 L of fluid given patient's age his heart rates have come down nicely blood pressures remained stable.  The patient is on the cardiac monitor to evaluate for evidence of arrhythmia and/or significant heart rate changes.      FINAL CLINICAL IMPRESSION(S) / ED DIAGNOSES   Final diagnoses:  Pyelonephritis  Sepsis, due to unspecified organism, unspecified whether acute organ dysfunction present [facility])     Rx / DC Orders   ED Discharge Orders     None        Note:  This document was prepared using Dragon voice recognition software and may include unintentional dictation errors.   Lubertha Rush, MD 10/19/23 2156

## 2023-10-20 DIAGNOSIS — N390 Urinary tract infection, site not specified: Secondary | ICD-10-CM

## 2023-10-20 DIAGNOSIS — A4150 Gram-negative sepsis, unspecified: Secondary | ICD-10-CM

## 2023-10-20 DIAGNOSIS — N12 Tubulo-interstitial nephritis, not specified as acute or chronic: Secondary | ICD-10-CM | POA: Diagnosis not present

## 2023-10-20 DIAGNOSIS — A419 Sepsis, unspecified organism: Secondary | ICD-10-CM

## 2023-10-20 LAB — BLOOD CULTURE ID PANEL (REFLEXED) - BCID2
A.calcoaceticus-baumannii: NOT DETECTED
Bacteroides fragilis: NOT DETECTED
CTX-M ESBL: DETECTED — AB
Candida albicans: NOT DETECTED
Candida auris: NOT DETECTED
Candida glabrata: NOT DETECTED
Candida krusei: NOT DETECTED
Candida parapsilosis: NOT DETECTED
Candida tropicalis: NOT DETECTED
Carbapenem resist OXA 48 LIKE: NOT DETECTED
Carbapenem resistance IMP: NOT DETECTED
Carbapenem resistance KPC: NOT DETECTED
Carbapenem resistance NDM: NOT DETECTED
Carbapenem resistance VIM: NOT DETECTED
Cryptococcus neoformans/gattii: NOT DETECTED
Enterobacter cloacae complex: NOT DETECTED
Enterobacterales: DETECTED — AB
Enterococcus Faecium: NOT DETECTED
Enterococcus faecalis: NOT DETECTED
Escherichia coli: NOT DETECTED
Haemophilus influenzae: NOT DETECTED
Klebsiella aerogenes: NOT DETECTED
Klebsiella oxytoca: NOT DETECTED
Klebsiella pneumoniae: DETECTED — AB
Listeria monocytogenes: NOT DETECTED
Neisseria meningitidis: NOT DETECTED
Proteus species: DETECTED — AB
Pseudomonas aeruginosa: NOT DETECTED
Salmonella species: NOT DETECTED
Serratia marcescens: NOT DETECTED
Staphylococcus aureus (BCID): NOT DETECTED
Staphylococcus epidermidis: NOT DETECTED
Staphylococcus lugdunensis: NOT DETECTED
Staphylococcus species: NOT DETECTED
Stenotrophomonas maltophilia: NOT DETECTED
Streptococcus agalactiae: NOT DETECTED
Streptococcus pneumoniae: NOT DETECTED
Streptococcus pyogenes: NOT DETECTED
Streptococcus species: NOT DETECTED

## 2023-10-20 LAB — PHOSPHORUS: Phosphorus: 3.3 mg/dL (ref 2.5–4.6)

## 2023-10-20 LAB — CBC
HCT: 38.8 % — ABNORMAL LOW (ref 39.0–52.0)
Hemoglobin: 13.4 g/dL (ref 13.0–17.0)
MCH: 32.4 pg (ref 26.0–34.0)
MCHC: 34.5 g/dL (ref 30.0–36.0)
MCV: 93.7 fL (ref 80.0–100.0)
Platelets: 124 10*3/uL — ABNORMAL LOW (ref 150–400)
RBC: 4.14 MIL/uL — ABNORMAL LOW (ref 4.22–5.81)
RDW: 12.8 % (ref 11.5–15.5)
WBC: 11.3 10*3/uL — ABNORMAL HIGH (ref 4.0–10.5)
nRBC: 0 % (ref 0.0–0.2)

## 2023-10-20 LAB — BASIC METABOLIC PANEL WITH GFR
Anion gap: 6 (ref 5–15)
BUN: 16 mg/dL (ref 8–23)
CO2: 25 mmol/L (ref 22–32)
Calcium: 8.3 mg/dL — ABNORMAL LOW (ref 8.9–10.3)
Chloride: 107 mmol/L (ref 98–111)
Creatinine, Ser: 1.06 mg/dL (ref 0.61–1.24)
GFR, Estimated: >60 mL/min (ref >60)
Glucose, Bld: 133 mg/dL — ABNORMAL HIGH (ref 70–99)
Potassium: 4.2 mmol/L (ref 3.5–5.1)
Sodium: 138 mmol/L (ref 135–145)

## 2023-10-20 LAB — VITAMIN D 25 HYDROXY (VIT D DEFICIENCY, FRACTURES): Vit D, 25-Hydroxy: 47.55 ng/mL (ref 30–100)

## 2023-10-20 LAB — MAGNESIUM: Magnesium: 1.6 mg/dL — ABNORMAL LOW (ref 1.7–2.4)

## 2023-10-20 LAB — VITAMIN B12: Vitamin B-12: 157 pg/mL — ABNORMAL LOW (ref 180–914)

## 2023-10-20 MED ORDER — MAGNESIUM SULFATE 2 GM/50ML IV SOLN
2.0000 g | Freq: Once | INTRAVENOUS | Status: AC
  Administered 2023-10-20: 2 g via INTRAVENOUS
  Filled 2023-10-20: qty 50

## 2023-10-20 MED ORDER — METOPROLOL TARTRATE 25 MG PO TABS
25.0000 mg | ORAL_TABLET | Freq: Two times a day (BID) | ORAL | Status: DC
  Administered 2023-10-21 – 2023-10-23 (×5): 25 mg via ORAL
  Filled 2023-10-20 (×5): qty 1

## 2023-10-20 MED ORDER — SODIUM CHLORIDE 0.9 % IV SOLN
1.0000 g | Freq: Three times a day (TID) | INTRAVENOUS | Status: DC
  Administered 2023-10-20 – 2023-10-23 (×9): 1 g via INTRAVENOUS
  Filled 2023-10-20 (×10): qty 20

## 2023-10-20 MED ORDER — OXYCODONE HCL 5 MG PO TABS
5.0000 mg | ORAL_TABLET | Freq: Four times a day (QID) | ORAL | Status: DC | PRN
  Administered 2023-10-20 – 2023-10-23 (×2): 5 mg via ORAL
  Filled 2023-10-20 (×2): qty 1

## 2023-10-20 MED ORDER — TRAZODONE HCL 50 MG PO TABS
50.0000 mg | ORAL_TABLET | Freq: Every day | ORAL | Status: AC
  Administered 2023-10-21: 50 mg via ORAL
  Filled 2023-10-20: qty 1

## 2023-10-20 NOTE — Progress Notes (Signed)
 PHARMACY - PHYSICIAN COMMUNICATION CRITICAL VALUE ALERT - BLOOD CULTURE IDENTIFICATION (BCID)  Donald Barry is an 78 y.o. male who presented to [facility] on 10/19/2023 with a chief complaint of N/V  Assessment:  blood culture from 6/8 with GNR, BCID detects Proteus species plus K. Pneumoniae and ESBL gene also detected.  Concern for urinary source.   Name of physician (or Provider) Contacted: Dr Judah North  Current antibiotics: Ceftriaxone   Changes to prescribed antibiotics recommended:  Recommendations accepted by provider - change to meropenem  Results for orders placed or performed during the hospital encounter of 10/19/23  Blood Culture ID Panel (Reflexed) (Collected: 10/19/2023  9:29 PM)  Result Value Ref Range   Enterococcus faecalis NOT DETECTED NOT DETECTED   Enterococcus Faecium NOT DETECTED NOT DETECTED   Listeria monocytogenes NOT DETECTED NOT DETECTED   Staphylococcus species NOT DETECTED NOT DETECTED   Staphylococcus aureus (BCID) NOT DETECTED NOT DETECTED   Staphylococcus epidermidis NOT DETECTED NOT DETECTED   Staphylococcus lugdunensis NOT DETECTED NOT DETECTED   Streptococcus species NOT DETECTED NOT DETECTED   Streptococcus agalactiae NOT DETECTED NOT DETECTED   Streptococcus pneumoniae NOT DETECTED NOT DETECTED   Streptococcus pyogenes NOT DETECTED NOT DETECTED   A.calcoaceticus-baumannii NOT DETECTED NOT DETECTED   Bacteroides fragilis NOT DETECTED NOT DETECTED   Enterobacterales DETECTED (A) NOT DETECTED   Enterobacter cloacae complex NOT DETECTED NOT DETECTED   Escherichia coli NOT DETECTED NOT DETECTED   Klebsiella aerogenes NOT DETECTED NOT DETECTED   Klebsiella oxytoca NOT DETECTED NOT DETECTED   Klebsiella pneumoniae DETECTED (A) NOT DETECTED   Proteus species DETECTED (A) NOT DETECTED   Salmonella species NOT DETECTED NOT DETECTED   Serratia marcescens NOT DETECTED NOT DETECTED   Haemophilus influenzae NOT DETECTED NOT DETECTED   Neisseria  meningitidis NOT DETECTED NOT DETECTED   Pseudomonas aeruginosa NOT DETECTED NOT DETECTED   Stenotrophomonas maltophilia NOT DETECTED NOT DETECTED   Candida albicans NOT DETECTED NOT DETECTED   Candida auris NOT DETECTED NOT DETECTED   Candida glabrata NOT DETECTED NOT DETECTED   Candida krusei NOT DETECTED NOT DETECTED   Candida parapsilosis NOT DETECTED NOT DETECTED   Candida tropicalis NOT DETECTED NOT DETECTED   Cryptococcus neoformans/gattii NOT DETECTED NOT DETECTED   CTX-M ESBL DETECTED (A) NOT DETECTED   Carbapenem resistance IMP NOT DETECTED NOT DETECTED   Carbapenem resistance KPC NOT DETECTED NOT DETECTED   Carbapenem resistance NDM NOT DETECTED NOT DETECTED   Carbapenem resist OXA 48 LIKE NOT DETECTED NOT DETECTED   Carbapenem resistance VIM NOT DETECTED NOT DETECTED    Valma Gazella, PharmD, BCPS, BCIDP Work Cell: [phone] 10/20/2023 10:45 AM

## 2023-10-20 NOTE — Plan of Care (Signed)
 Tamea Faith, LPN

## 2023-10-20 NOTE — Consult Note (Signed)
 Infectious Disease     Reason for Consult:Gram neg bacteremia, UTI   Referring Physician: Dr Hubert Madden Date of Admission:  10/19/2023   Principal Problem:   Sepsis due to gram-negative UTI [facility])   HPI: Donald Barry is a 78 y.o. male admitted June 8 when he presented with complaints of left lower quadrant pain.  He has cystectomy and ileal conduit.  Previously was on Bactrim prophylaxis but this was discontinued due to hyperkalemia.  On admission he was having nausea vomiting and abdominal pain.  White blood count was 12.  Lactic acid was 2.1 he was afebrile.  He was started on ceftriaxone .  Imaging showed CT abdomen and pelvis with findings of gallstone in the gallbladder but otherwise normal, pancreas normal right kidney with normal enhancement pattern left prior cystectomy with ileal conduit and urostomy.  Left kidney showed decreased enhancement compared to the right with hydronephrosis and hydroureter but no definitive calculus.  There was enhancements of the urethral wall and perinephric stranding.  He had diverticuli but no diverticulitis Since admission blood cultures have been positive by [facility] ID for Klebsiella as well as Proteus species with ESBL detected.  Urine culture is pending.  Urinalysis showed greater than 50 whites and reds. Since admission he had been changed to meropenem and feeling better but still with pain LLQ.   He tells me he has a complicated history with recurrent UTIS and frequent admissions and follows with ID and urology at [facility].  Was doing well on suppressive bavtrim until Jan when had to stop due to elevated K of 5.5. 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 SCCis   Squamous cell skin cancer 07/2017   Resected from scalp.   Past Surgical History:  Procedure Laterality Date   BACK SURGERY  2008   L4, L5   COLONOSCOPY     cystectomy with prostatectomy ileal conduit     CYSTO WITH HYDRODISTENSION N/A 06/17/2019   Procedure: CYSTOSCOPY/HYDRODISTENSION;  Surgeon: Lawerence Pressman, MD;  Location: [facility] ORS;  Service: Urology;  Laterality: N/A;   CYSTOSCOPY WITH BIOPSY N/A 07/17/2018   Procedure: CYSTOSCOPY WITH BLADDER BIOPSY;  Surgeon: Lawerence Pressman, MD;  Location: [facility] ORS;  Service: Urology;  Laterality: N/A;   CYSTOSCOPY WITH BIOPSY N/A 06/17/2019   Procedure: CYSTOSCOPY WITH BLADDER  BIOPSY;  Surgeon: Lawerence Pressman, MD;  Location: [facility] ORS;  Service: Urology;  Laterality: N/A;   CYSTOSCOPY WITH FULGERATION N/A 07/17/2018   Procedure: CYSTOSCOPY WITH FULGERATION;  Surgeon: Lawerence Pressman, MD;  Location: [facility] ORS;  Service: Urology;  Laterality: N/A;   CYSTOSCOPY WITH FULGERATION N/A 06/17/2019   Procedure: CYSTOSCOPY WITH FULGERATION;  Surgeon: Lawerence Pressman,  MD;  Location: [facility] ORS;  Service: Urology;  Laterality: N/A;   ESOPHAGOGASTRODUODENOSCOPY     HERNIA REPAIR  2012   umbilical   STOMACH SURGERY     FUNDIC GLAND POLYP   Social History   Tobacco Use   Smoking status: Never   Smokeless tobacco: Never  Vaping Use   Vaping status: Never Used  Substance Use Topics   Alcohol use: Never   Drug use: Never   Family History  Problem Relation Age of Onset   Hypertension Mother    Heart disease Father    GER disease Sister    GER  disease Brother    Prostate cancer Neg Hx    Kidney cancer Neg Hx     Allergies:  Allergies  Allergen Reactions   Tape Dermatitis    Skin blistered- steri strip  Skin blistered   Wound Dressing Adhesive Dermatitis    Skin blistered- steri strip   Shellfish Allergy Diarrhea and Nausea And Vomiting    Shrimp    Shellfish-Derived Products Diarrhea, Nausea And Vomiting and Nausea Only   Silicone Dermatitis    CPAP mask material breaks him out into a whelps and blisters.   Methenamine Diarrhea and Nausea And Vomiting   Onion Other (See Comments)    diarrhea Diarrhea- verified avoids all onion ingredient (including onion powder). MM, RD 12/19/20   Other Nausea And Vomiting    general anesthesia   Codeine Nausea Only    Current antibiotics: Antibiotics Given (last 72 hours)     Date/Time Action Medication Dose Rate   10/19/23 2125 New Bag/Given   ceFEPIme  (MAXIPIME ) 2 g in sodium chloride  0.9 % 100 mL IVPB 2 g 200 mL/hr   10/20/23 0457 New Bag/Given   cefTRIAXone  (ROCEPHIN ) 2 g in sodium chloride  0.9 % 100 mL IVPB 2 g 200 mL/hr       MEDICATIONS:  apixaban   5 mg Oral BID   atorvastatin   10 mg Oral Daily   metoprolol  tartrate  25 mg Oral BID   traZODone   100 mg Oral QHS    Review of Systems - 11 systems reviewed and negative per HPI   OBJECTIVE: Temp:  [97.9 F (36.6 C)-99.4 F (37.4 C)] 99.4 F (37.4 C) (06/09 0739) Pulse Rate:  [76-130] 76 (06/09 0739) Resp:  [16-20] 16 (06/09 0739) BP: (92-119)/(53-82) 99/66 (06/09 0739) SpO2:  [93 %-99 %] 94 % (06/09 0739) Weight:  [90.7 kg] 90.7 kg (06/08 2256) Physical Exam  Constitutional: He is oriented to person, place, and time. He appears well-developed and well-nourished. No distress.  HENT: anicteri Mouth/Throat: Oropharynx is clear and moist. No oropharyngeal exudate.  Cardiovascular: Normal rate, regular rhythm and normal heart sounds. Exam reveals no gallop and no friction rub. No murmur heard.  Pulmonary/Chest:  Effort normal and breath sounds normal. No respiratory distress. He has no wheezes.  Abdominal: Soft. Bowel sounds are normal. He exhibits no distension. There is mild TTP LLQ. Ileastomy site wnl Lymphadenopathy:  He has no cervical adenopathy.  Neurological: He is alert and oriented to person, place, and time.  Skin: Skin is warm and dry. No rash noted. No erythema.  Psychiatric: He has a normal mood and affect. His behavior is normal.     LABS: Results for orders placed or performed during the hospital encounter of 10/19/23 (from the past 48 hours)  Blood culture (routine x 2)     Status: None (Preliminary result)   Collection Time: 10/19/23  7:26 PM  Specimen: BLOOD  Result Value Ref Range   Specimen Description BLOOD BLOOD RIGHT HAND    Special Requests      BOTTLES DRAWN AEROBIC AND ANAEROBIC Blood Culture adequate volume   Culture      NO GROWTH < 12 HOURS Performed at [facility], [address] [place], [place] [address]    Report Status PENDING   CBC with Differential     Status: Abnormal   Collection Time: 10/19/23  7:27 PM  Result Value Ref Range   WBC 12.0 (H) 4.0 - 10.5 K/uL   RBC 4.51 4.22 - 5.81 MIL/uL   Hemoglobin 14.7 13.0 - 17.0 g/dL   HCT 60.4 54.0 - 98.1 %   MCV 92.5 80.0 - 100.0 fL   MCH 32.6 26.0 - 34.0 pg   MCHC 35.3 30.0 - 36.0 g/dL   RDW 19.1 47.8 - 29.5 %   Platelets 152 150 - 400 K/uL   nRBC 0.0 0.0 - 0.2 %   Neutrophils Relative % 91 %   Neutro Abs 10.9 (H) 1.7 - 7.7 K/uL   Lymphocytes Relative 5 %   Lymphs Abs 0.5 (L) 0.7 - 4.0 K/uL   Monocytes Relative 4 %   Monocytes Absolute 0.4 0.1 - 1.0 K/uL   Eosinophils Relative 0 %   Eosinophils Absolute 0.0 0.0 - 0.5 K/uL   Basophils Relative 0 %   Basophils Absolute 0.0 0.0 - 0.1 K/uL   Immature Granulocytes 0 %   Abs Immature Granulocytes 0.04 0.00 - 0.07 K/uL    Comment: Performed at [facility], [address] [place], [place] [address]  Comprehensive metabolic panel      Status: Abnormal   Collection Time: 10/19/23  7:27 PM  Result Value Ref Range   Sodium 139 135 - 145 mmol/L   Potassium 3.9 3.5 - 5.1 mmol/L   Chloride 106 98 - 111 mmol/L   CO2 20 (L) 22 - 32 mmol/L   Glucose, Bld 114 (H) 70 - 99 mg/dL    Comment: Glucose reference range applies only to samples taken after fasting for at least 8 hours.   BUN 18 8 - 23 mg/dL   Creatinine, Ser 8.65 0.61 - 1.24 mg/dL   Calcium  9.0 8.9 - 10.3 mg/dL   Total Protein 7.2 6.5 - 8.1 g/dL   Albumin  4.1 3.5 - 5.0 g/dL   AST 30 15 - 41 U/L   ALT 17 0 - 44 U/L   Alkaline Phosphatase 29 (L) 38 - 126 U/L   Total Bilirubin 0.8 0.0 - 1.2 mg/dL   GFR, Estimated >78 >46 mL/min    Comment: (NOTE) Calculated using the CKD-EPI Creatinine Equation (2021)    Anion gap 13 5 - 15    Comment: Performed at [facility], [address] [place], [place] [address]  Lipase, blood     Status: Abnormal   Collection Time: 10/19/23  7:27 PM  Result Value Ref Range   Lipase 66 (H) 11 - 51 U/L    Comment: Performed at [facility], [address] [place], [place] [address]  Lactic acid, plasma     Status: Abnormal   Collection Time: 10/19/23  7:27 PM  Result Value Ref Range   Lactic Acid, Venous 2.1 (HH) 0.5 - 1.9 mmol/L    Comment: CRITICAL RESULT CALLED TO, READ BACK BY AND VERIFIED WITH ORANIQUE [facility] @ 10/19/23 1959 AB Performed at [facility], [address], [place], [place] [address]  Troponin I (High Sensitivity)     Status: None   Collection Time: 10/19/23  7:27 PM  Result Value Ref Range   Troponin I (High Sensitivity) 4 <18 ng/L    Comment: (NOTE) Elevated high sensitivity troponin I (hsTnI) values and significant  changes across serial measurements may suggest ACS but many other  chronic and acute conditions are known to elevate hsTnI results.  Refer to the "Links" section for chest pain algorithms and additional  guidance. Performed at [facility], [address] [place], [place] [address]   Urinalysis, Routine w reflex microscopic -Urine, Clean Catch     Status: Abnormal   Collection Time: 10/19/23  7:44 PM  Result Value Ref Range   Color, Urine YELLOW (A) YELLOW   APPearance CLOUDY (A) CLEAR   Specific Gravity, Urine 1.013 1.005 - 1.030   pH 8.0 5.0 - 8.0   Glucose, UA NEGATIVE NEGATIVE mg/dL   Hgb urine dipstick SMALL (A) NEGATIVE   Bilirubin Urine NEGATIVE NEGATIVE   Ketones, ur NEGATIVE NEGATIVE mg/dL   Protein, ur 981 (A) NEGATIVE mg/dL   Nitrite NEGATIVE NEGATIVE   Leukocytes,Ua LARGE (A) NEGATIVE   RBC / HPF >50 0 - 5 RBC/hpf   WBC, UA >50 0 - 5 WBC/hpf   Bacteria, UA FEW (A) NONE SEEN   Squamous Epithelial / HPF 0 0 - 5 /HPF   Mucus PRESENT     Comment: Performed at [facility], [address] [place], [place] [address]  Blood culture (routine x 2)     Status: None (Preliminary result)   Collection Time: 10/19/23  9:29 PM   Specimen: BLOOD  Result Value Ref Range   Specimen Description BLOOD BLOOD LEFT ARM    Special Requests      BOTTLES DRAWN AEROBIC AND ANAEROBIC Blood Culture results may not be optimal due to an excessive volume of blood received in culture bottles   Culture  Setup Time      GRAM NEGATIVE RODS IN BOTH AEROBIC AND ANAEROBIC BOTTLES Organism ID to follow CRITICAL RESULT CALLED TO, READ BACK BY AND VERIFIED WITH: LISA KLUTTZ PHARMD 1026 10/20/23 HNM    Culture      NO GROWTH < 12 HOURS Performed at [facility], [address] [place], [place] [address]    Report Status PENDING   Blood Culture ID Panel (Reflexed)     Status: Abnormal   Collection Time: 10/19/23  9:29 PM  Result Value Ref Range   Enterococcus faecalis NOT DETECTED NOT DETECTED   Enterococcus Faecium NOT DETECTED NOT DETECTED   Listeria monocytogenes NOT DETECTED NOT DETECTED   Staphylococcus species NOT DETECTED NOT DETECTED   Staphylococcus aureus (BCID) NOT DETECTED NOT DETECTED   Staphylococcus epidermidis  NOT DETECTED NOT DETECTED   Staphylococcus lugdunensis NOT DETECTED NOT DETECTED   Streptococcus species NOT DETECTED NOT DETECTED   Streptococcus agalactiae NOT DETECTED NOT DETECTED   Streptococcus pneumoniae NOT DETECTED NOT DETECTED   Streptococcus pyogenes NOT DETECTED NOT DETECTED   A.calcoaceticus-baumannii NOT DETECTED NOT DETECTED   Bacteroides fragilis NOT DETECTED NOT DETECTED   Enterobacterales DETECTED (A) NOT DETECTED    Comment: CRITICAL RESULT CALLED TO, READ BACK BY AND VERIFIED WITH: LISA KLUTTZ PHARMD 1026 10/20/23 HNM    Enterobacter cloacae complex NOT DETECTED NOT DETECTED   Escherichia coli NOT DETECTED NOT DETECTED   Klebsiella aerogenes NOT DETECTED NOT DETECTED   Klebsiella oxytoca NOT DETECTED NOT DETECTED   Klebsiella pneumoniae DETECTED (  A) NOT DETECTED    Comment: CRITICAL RESULT CALLED TO, READ BACK BY AND VERIFIED WITH: LISA KLUTTZ PHARMD 1026 10/20/23 HNM    Proteus species DETECTED (A) NOT DETECTED    Comment: CRITICAL RESULT CALLED TO, READ BACK BY AND VERIFIED WITH: LISA KLUTTZ PHARMD 1026 10/20/23 HNM    Salmonella species NOT DETECTED NOT DETECTED   Serratia marcescens NOT DETECTED NOT DETECTED   Haemophilus influenzae NOT DETECTED NOT DETECTED   Neisseria meningitidis NOT DETECTED NOT DETECTED   Pseudomonas aeruginosa NOT DETECTED NOT DETECTED   Stenotrophomonas maltophilia NOT DETECTED NOT DETECTED   Candida albicans NOT DETECTED NOT DETECTED   Candida auris NOT DETECTED NOT DETECTED   Candida glabrata NOT DETECTED NOT DETECTED   Candida krusei NOT DETECTED NOT DETECTED   Candida parapsilosis NOT DETECTED NOT DETECTED   Candida tropicalis NOT DETECTED NOT DETECTED   Cryptococcus neoformans/gattii NOT DETECTED NOT DETECTED   CTX-M ESBL DETECTED (A) NOT DETECTED    Comment: CRITICAL RESULT CALLED TO, READ BACK BY AND VERIFIED WITH: LISA KLUTTZ PHARMD 1026 10/20/23 HNM (NOTE) Extended spectrum beta-lactamase detected. Recommend a carbapenem  as initial therapy.      Carbapenem resistance IMP NOT DETECTED NOT DETECTED   Carbapenem resistance KPC NOT DETECTED NOT DETECTED   Carbapenem resistance NDM NOT DETECTED NOT DETECTED   Carbapenem resist OXA 48 LIKE NOT DETECTED NOT DETECTED   Carbapenem resistance VIM NOT DETECTED NOT DETECTED    Comment: Performed at [facility], [address] [place], [place] [address]  Lactic acid, plasma     Status: None   Collection Time: 10/19/23  9:53 PM  Result Value Ref Range   Lactic Acid, Venous 1.1 0.5 - 1.9 mmol/L    Comment: Performed at [facility], [address] [place], [place] [address]  Troponin I (High Sensitivity)     Status: None   Collection Time: 10/19/23  9:53 PM  Result Value Ref Range   Troponin I (High Sensitivity) 5 <18 ng/L    Comment: (NOTE) Elevated high sensitivity troponin I (hsTnI) values and significant  changes across serial measurements may suggest ACS but many other  chronic and acute conditions are known to elevate hsTnI results.  Refer to the "Links" section for chest pain algorithms and additional  guidance. Performed at [facility], [address] [place], [place] [address]   Basic metabolic panel     Status: Abnormal   Collection Time: 10/20/23  2:18 AM  Result Value Ref Range   Sodium 138 135 - 145 mmol/L   Potassium 4.2 3.5 - 5.1 mmol/L   Chloride 107 98 - 111 mmol/L   CO2 25 22 - 32 mmol/L   Glucose, Bld 133 (H) 70 - 99 mg/dL    Comment: Glucose reference range applies only to samples taken after fasting for at least 8 hours.   BUN 16 8 - 23 mg/dL   Creatinine, Ser 9.14 0.61 - 1.24 mg/dL   Calcium  8.3 (L) 8.9 - 10.3 mg/dL   GFR, Estimated >78 >29 mL/min    Comment: (NOTE) Calculated using the CKD-EPI Creatinine Equation (2021)    Anion gap 6 5 - 15    Comment: Performed at [facility], [address] [place], [place] [address]  CBC     Status: Abnormal   Collection Time: 10/20/23  2:18 AM   Result Value Ref Range   WBC 11.3 (H) 4.0 - 10.5 K/uL   RBC 4.14 (L) 4.22 -  5.81 MIL/uL   Hemoglobin 13.4 13.0 - 17.0 g/dL   HCT 16.1 (L) 09.6 - 04.5 %   MCV 93.7 80.0 - 100.0 fL   MCH 32.4 26.0 - 34.0 pg   MCHC 34.5 30.0 - 36.0 g/dL   RDW 40.9 81.1 - 91.4 %   Platelets 124 (L) 150 - 400 K/uL   nRBC 0.0 0.0 - 0.2 %    Comment: Performed at [facility], [address] [place], [place] [address]  Magnesium      Status: Abnormal   Collection Time: 10/20/23  2:18 AM  Result Value Ref Range   Magnesium  1.6 (L) 1.7 - 2.4 mg/dL    Comment: Performed at [facility], [address] [place], [place] [address]  Phosphorus     Status: None   Collection Time: 10/20/23  2:18 AM  Result Value Ref Range   Phosphorus 3.3 2.5 - 4.6 mg/dL    Comment: Performed at [facility], [address] [place], [place] [address]   No components found for: "ESR", "C REACTIVE PROTEIN" MICRO: Recent Results (from the past 720 hours)  Blood culture (routine x 2)     Status: None (Preliminary result)   Collection Time: 10/19/23  7:26 PM   Specimen: BLOOD  Result Value Ref Range Status   Specimen Description BLOOD BLOOD RIGHT HAND  Final   Special Requests   Final    BOTTLES DRAWN AEROBIC AND ANAEROBIC Blood Culture adequate volume   Culture   Final    NO GROWTH < 12 HOURS Performed at [facility], [address] [place], [place] [address]    Report Status PENDING  Incomplete  Blood culture (routine x 2)     Status: None (Preliminary result)   Collection Time: 10/19/23  9:29 PM   Specimen: BLOOD  Result Value Ref Range Status   Specimen Description BLOOD BLOOD LEFT ARM  Final   Special Requests   Final    BOTTLES DRAWN AEROBIC AND ANAEROBIC Blood Culture results may not be optimal due to an excessive volume of blood received in culture bottles   Culture  Setup Time   Final    GRAM NEGATIVE RODS IN BOTH AEROBIC AND ANAEROBIC BOTTLES Organism ID to  follow CRITICAL RESULT CALLED TO, READ BACK BY AND VERIFIED WITH: LISA KLUTTZ PHARMD 1026 10/20/23 HNM    Culture   Final    NO GROWTH < 12 HOURS Performed at [facility], [address] [place], [place] [address]    Report Status PENDING  Incomplete  Blood Culture ID Panel (Reflexed)     Status: Abnormal   Collection Time: 10/19/23  9:29 PM  Result Value Ref Range Status   Enterococcus faecalis NOT DETECTED NOT DETECTED Final   Enterococcus Faecium NOT DETECTED NOT DETECTED Final   Listeria monocytogenes NOT DETECTED NOT DETECTED Final   Staphylococcus species NOT DETECTED NOT DETECTED Final   Staphylococcus aureus (BCID) NOT DETECTED NOT DETECTED Final   Staphylococcus epidermidis NOT DETECTED NOT DETECTED Final   Staphylococcus lugdunensis NOT DETECTED NOT DETECTED Final   Streptococcus species NOT DETECTED NOT DETECTED Final   Streptococcus agalactiae NOT DETECTED NOT DETECTED Final   Streptococcus pneumoniae NOT DETECTED NOT DETECTED Final   Streptococcus pyogenes NOT DETECTED NOT DETECTED Final   A.calcoaceticus-baumannii NOT DETECTED NOT DETECTED Final   Bacteroides fragilis NOT DETECTED NOT DETECTED Final   Enterobacterales DETECTED (A) NOT DETECTED Final    Comment: CRITICAL RESULT CALLED TO, READ  BACK BY AND VERIFIED WITH: LISA KLUTTZ PHARMD 1026 10/20/23 HNM    Enterobacter cloacae complex NOT DETECTED NOT DETECTED Final   Escherichia coli NOT DETECTED NOT DETECTED Final   Klebsiella aerogenes NOT DETECTED NOT DETECTED Final   Klebsiella oxytoca NOT DETECTED NOT DETECTED Final   Klebsiella pneumoniae DETECTED (A) NOT DETECTED Final    Comment: CRITICAL RESULT CALLED TO, READ BACK BY AND VERIFIED WITH: LISA KLUTTZ PHARMD 1026 10/20/23 HNM    Proteus species DETECTED (A) NOT DETECTED Final    Comment: CRITICAL RESULT CALLED TO, READ BACK BY AND VERIFIED WITH: LISA KLUTTZ PHARMD 1026 10/20/23 HNM    Salmonella species NOT DETECTED NOT DETECTED Final   Serratia  marcescens NOT DETECTED NOT DETECTED Final   Haemophilus influenzae NOT DETECTED NOT DETECTED Final   Neisseria meningitidis NOT DETECTED NOT DETECTED Final   Pseudomonas aeruginosa NOT DETECTED NOT DETECTED Final   Stenotrophomonas maltophilia NOT DETECTED NOT DETECTED Final   Candida albicans NOT DETECTED NOT DETECTED Final   Candida auris NOT DETECTED NOT DETECTED Final   Candida glabrata NOT DETECTED NOT DETECTED Final   Candida krusei NOT DETECTED NOT DETECTED Final   Candida parapsilosis NOT DETECTED NOT DETECTED Final   Candida tropicalis NOT DETECTED NOT DETECTED Final   Cryptococcus neoformans/gattii NOT DETECTED NOT DETECTED Final   CTX-M ESBL DETECTED (A) NOT DETECTED Final    Comment: CRITICAL RESULT CALLED TO, READ BACK BY AND VERIFIED WITH: LISA KLUTTZ PHARMD 1026 10/20/23 HNM (NOTE) Extended spectrum beta-lactamase detected. Recommend a carbapenem as initial therapy.      Carbapenem resistance IMP NOT DETECTED NOT DETECTED Final   Carbapenem resistance KPC NOT DETECTED NOT DETECTED Final   Carbapenem resistance NDM NOT DETECTED NOT DETECTED Final   Carbapenem resist OXA 48 LIKE NOT DETECTED NOT DETECTED Final   Carbapenem resistance VIM NOT DETECTED NOT DETECTED Final    Comment: Performed at [facility], [address] [place], [place] [address]    IMAGING: CT Angio Abd/Pel W and/or Wo Contrast Result Date: 10/19/2023 CLINICAL DATA:  Abdominal pain and vomiting, initial encounter EXAM: CTA ABDOMEN AND PELVIS WITHOUT AND WITH CONTRAST TECHNIQUE: Multidetector CT imaging of the abdomen and pelvis was performed using the standard protocol during bolus administration of intravenous contrast. Multiplanar reconstructed images and MIPs were obtained and reviewed to evaluate the vascular anatomy. RADIATION DOSE REDUCTION: This exam was performed according to the departmental dose-optimization program which includes automated exposure control, adjustment of the mA  and/or kV according to patient size and/or use of iterative reconstruction technique. CONTRAST:  OMNIPAQUE  IOHEXOL  350 MG/ML SOLN COMPARISON:  04/14/2022 FINDINGS: VASCULAR Aorta: Abdominal aorta demonstrates minimal atherosclerotic calcifications. Celiac: Mild atherosclerotic calcifications the origin is noted without significant stenosis. SMA: Patent without evidence of aneurysm, dissection, vasculitis or significant stenosis. Renals: Both renal arteries are patent without evidence of aneurysm, dissection, vasculitis, fibromuscular dysplasia or significant stenosis. IMA: Patent without evidence of aneurysm, dissection, vasculitis or significant stenosis. Inflow: Iliacs are within normal limits.  No focal stenosis is seen. Veins: No specific venous abnormality is noted. Review of the MIP images confirms the above findings. NON-VASCULAR Lower chest: Mild bibasilar scarring is noted. Hepatobiliary: Dependent gallstone is noted within the gallbladder. No complicating factors are seen. The liver is within normal limits. Pancreas: Unremarkable. No pancreatic ductal dilatation or surrounding inflammatory changes. Spleen: Normal in size without focal abnormality. Adrenals/Urinary Tract: Adrenal glands are within normal limits. The right kidney shows a normal enhancement pattern. No renal  calculi or obstructive changes are seen. Changes of prior cystectomy with ileal conduit and urostomy are seen. The left kidney demonstrates decreased enhancement compared to the right with significant hydronephrosis and hydroureter which extends to the anastomotic site of the ileal conduit. No definitive calculus is seen. Enhancement of the ureteral wall is noted as well as significant perinephric stranding. These changes likely represent underlying urinary tract infection. The overall appearance is similar to that seen on the prior CT examination from 2023. Stomach/Bowel: Scattered diverticular change of the colon is noted. No  definitive diverticulitis is seen. The appendix is within normal limits. Small bowel and stomach are within normal limits. Lymphatic: No lymphadenopathy is noted. 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 Other: No abdominal wall hernia or abnormality. No abdominopelvic ascites. Musculoskeletal: No acute or significant osseous findings. IMPRESSION: VASCULAR No vascular abnormality to suggest mesenteric ischemia. NON-VASCULAR Changes in the left kidney similar to that seen on the prior exam with hydronephrosis and hydroureter extending to the anastomotic site to the ileal conduit. Ureteral enhancement and perinephric stranding is noted. These changes are suspicious for underlying urinary tract infection. Likely long-standing narrowing at the anastomotic site is present as well. Diverticulosis without diverticulitis. Cholelithiasis. Electronically Signed   By: Violeta Grey M.D.   On: 10/19/2023 20:58    Assessment:   Donald Barry is a 78 y.o. male with complicated urological history and recurrent UTIs now with GNR bacteremia Kleb and Proteus with ESBL +. CT reviewed. Seen by urology. 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 Luster Salters at [facility]   Clincially improving. He was previously treated with otpt IV abx last year but was infection free for over 6 months on oral bactrim stopped in Jan due to mild elevated K.    Recommendations Cont meropenem pending results Will await to see if any oral options otherwise may need midline and IV carbapenem. Will consider restarting bactrim for suppression if sensitive. WIll have fu with ID at [facility] and with primary urology there.  Thank you very much for allowing me to participate in the care of this patient. Please call with questions.   Merri Abbe. Harwood Lingo, MD

## 2023-10-20 NOTE — Progress Notes (Signed)
 PT Cancellation Note  Patient Details Name: Donald Barry MRN: [id number] DOB: Aug 17, 1945   Cancelled Treatment:    Reason Eval/Treat Not Completed: PT screened, no needs identified, will sign off. PT orders received and pt chart reviewed. He was admitted to the hospital on 6/8 for sepsis secondary to UTI. Spouse at bedside upon entry. Pt politely declining participation with PT eval this date. He confirms that he has been OOB since admission and feels that his strength, balance, and endurance have not suffered as a result of this admission. He is normally IND at baseline without AD, lives in a one level home, no STE. Pt educated for continued mobility in room, and will place consult for mobility tech for hallway ambulation. Pt and spouse educated to reach out to care team for re-consult as appropriate. They verbalized understanding.    Branda Cain, PT, DPT 9:42 AM,10/20/23 Physical Therapist - [facility]

## 2023-10-20 NOTE — Consult Note (Addendum)
 Urology Consult  I have been asked to see the patient by Dr. Hubert Madden, for evaluation and management of left pyelonephritis.  Chief Complaint: LLQ pain, nausea, vomiting  History of Present Illness: Donald Barry is a 78 y.o. 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 Luster Salters at [facility] admitted yesterday with sepsis due to left pyelonephritis.  CT angio abdomen and pelvis on presentation notable for stable left hydroureteronephrosis to the level of the ileal conduit with new ureteral enhancement and perinephric stranding.  Admission labs notable for white count 12.0; creatinine at baseline, 1.03; lactate 2.1; and UA with >50 RBC/hpf, >50 WBC/hpf, and few bacteria.  Urine culture pending with no growth at 12 hours, blood cultures preliminarily growing multiple species including Klebsiella pneumoniae and Proteus with evidence of ESBL organisms; on antibiotics as below.  This morning, white count is stable at 11.3 and creatinine is stable at 1.06.  He is afebrile and BPs remain stably soft.  Tachycardia has resolved.  He reports feeling somewhat better since arrival.  His pain is improved.  He continues to have poor appetite. 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  Anti-infectives (From admission, onward)    Start     Dose/Rate Route Frequency Ordered Stop   10/20/23 1145  meropenem (MERREM) 1 g in sodium chloride  0.9 % 100 mL IVPB        1 g 200 mL/hr over 30 Minutes Intravenous Every 8 hours 10/20/23 1046     10/20/23 0500  cefTRIAXone  (ROCEPHIN ) 2 g in sodium chloride  0.9 % 100 mL IVPB  Status:  Discontinued        2 g 200 mL/hr over 30 Minutes Intravenous Every 24 hours 10/19/23 2154 10/20/23 1046   10/19/23 2115  ceFEPIme  (MAXIPIME ) 2 g in sodium chloride  0.9 % 100 mL IVPB        2 g 200 mL/hr over 30 Minutes  Intravenous  Once 10/19/23 2113 10/19/23 2220       Past Medical History:  Diagnosis Date   A-fib [facility])    Actinic keratosis    Hx of PDT Tx., face 1/252021 and scalp 07/05/2019   Allergic rhinitis, seasonal 06/14/2014   Allergy    Arthritis    Chronic neck pain 06/14/2014   Complication of anesthesia    Gastroesophageal reflux disease without esophagitis 06/14/2014   Headache    h/o migraines   Hepatic steatosis 04/05/2018   Hiatal hernia 09/09/2017   Intermittent left lower quadrant abdominal pain 07/15/2017   PONV (postoperative nausea and vomiting)    Sleep apnea    cpap   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 SCCis   Squamous cell skin cancer 07/2017   Resected from scalp.    Past Surgical History:  Procedure Laterality Date   BACK SURGERY  2008   L4, L5   COLONOSCOPY     cystectomy with prostatectomy ileal conduit     CYSTO WITH HYDRODISTENSION N/A 06/17/2019   Procedure: CYSTOSCOPY/HYDRODISTENSION;  Surgeon: Lawerence Pressman, MD;  Location: [facility] ORS;  Service: Urology;  Laterality: N/A;   CYSTOSCOPY WITH BIOPSY N/A 07/17/2018   Procedure: CYSTOSCOPY WITH BLADDER BIOPSY;  Surgeon: Lawerence Pressman, MD;  Location: [facility] ORS;  Service: Urology;  Laterality: N/A;   CYSTOSCOPY WITH BIOPSY N/A 06/17/2019   Procedure: CYSTOSCOPY WITH BLADDER  BIOPSY;  Surgeon: Lawerence Pressman, MD;  Location: [facility] ORS;  Service: Urology;  Laterality: N/A;   CYSTOSCOPY WITH FULGERATION N/A 07/17/2018   Procedure: CYSTOSCOPY WITH FULGERATION;  Surgeon: Lawerence Pressman, MD;  Location: [facility] ORS;  Service: Urology;  Laterality: N/A;   CYSTOSCOPY WITH FULGERATION N/A 06/17/2019   Procedure: CYSTOSCOPY WITH FULGERATION;  Surgeon: Lawerence Pressman, MD;  Location: [facility] ORS;  Service: Urology;  Laterality: N/A;   ESOPHAGOGASTRODUODENOSCOPY     HERNIA REPAIR  2012   umbilical   STOMACH SURGERY      FUNDIC GLAND POLYP    Home Medications:  Current Meds  Medication Sig   acetaminophen  (TYLENOL ) 500 MG tablet Take 1,000 mg by mouth.   apixaban  (ELIQUIS ) 5 MG TABS tablet Take 1 tablet (5 mg total) by mouth 2 (two) times daily.   atorvastatin  (LIPITOR) 10 MG tablet Take 1 tablet by mouth daily.   Coenzyme Q10 (COQ10) 100 MG CAPS Take 1 capsule by mouth daily.   Cranberry-Vitamin C-Vitamin E (CRANBERRY PLUS VITAMIN C) 4200-20-3 MG-MG-UNIT CAPS Take 400 mg by mouth.   D-Mannose 350 MG CAPS Herbal Name: D-Mannose 1000mg  daily   ferrous sulfate  325 (65 FE) MG EC tablet TAKE 1 TABLET BY MOUTH EVERY DAY   fluticasone  (FLONASE ) 50 MCG/ACT nasal spray Place 1 spray into both nostrils daily.   guaiFENesin (MUCINEX) 600 MG 12 hr tablet Take 600 mg by mouth at bedtime.   Hypertonic Nasal Wash (SINUS RINSE) PACK Use as needed for clearing sinuses of congestion   loratadine  (CLARITIN ) 10 MG tablet Take 1 tablet by mouth daily.   metoprolol  tartrate (LOPRESSOR ) 25 MG tablet Take 1 tablet (25 mg total) by mouth 2 (two) times daily.   montelukast (SINGULAIR) 10 MG tablet Take 1 tablet by mouth at bedtime.   Multiple Vitamin (MULTIVITAMIN WITH MINERALS) TABS tablet Take 1 tablet by mouth daily. Iron  Free   NON FORMULARY Place 1 Dose/kg into the nose daily as needed. Netty pot for sinuses   omeprazole (PRILOSEC) 40 MG capsule Take 1 capsule by mouth daily.   traZODone  (DESYREL ) 100 MG tablet Take 50 mg by mouth at bedtime.    Allergies:  Allergies  Allergen Reactions   Tape Dermatitis    Skin blistered- steri strip  Skin blistered   Wound Dressing Adhesive Dermatitis    Skin blistered- steri strip   Shellfish Allergy Diarrhea and Nausea And Vomiting    Shrimp    Shellfish-Derived Products Diarrhea, Nausea And Vomiting and Nausea Only   Silicone Dermatitis    CPAP mask material breaks him out into a whelps and blisters.   Methenamine Diarrhea and Nausea And Vomiting   Onion Other (See  Comments)    diarrhea Diarrhea- verified avoids all onion ingredient (including onion powder). MM, RD 12/19/20   Other Nausea And Vomiting    general anesthesia   Codeine Nausea Only    Family History  Problem Relation Age of Onset   Hypertension Mother    Heart disease Father    GER disease Sister    GER disease Brother    Prostate cancer Neg Hx    Kidney cancer Neg Hx     Social History:  reports that he has never smoked. He has never used smokeless tobacco. He reports that he does not drink alcohol and does not use drugs.  ROS: A complete review  of systems was performed.  All systems are negative except for pertinent findings as noted.  Physical Exam:  Vital signs in last 24 hours: Temp:  [97.9 F (36.6 C)-99.4 F (37.4 C)] 99.4 F (37.4 C) (06/09 0739) Pulse Rate:  [76-130] 76 (06/09 0739) Resp:  [16-20] 16 (06/09 0739) BP: (92-119)/(53-82) 99/66 (06/09 0739) SpO2:  [93 %-99 %] 94 % (06/09 0739) Weight:  [90.7 kg] 90.7 kg (06/08 2256) Constitutional:  Alert and oriented, no acute distress HEENT: [place] AT, moist mucus membranes Cardiovascular: No clubbing, cyanosis, or edema Respiratory: Normal respiratory effort Skin: No rashes, bruises or suspicious lesions Neurologic: Grossly intact, no focal deficits, moving all 4 extremities Psychiatric: Normal mood and affect  Laboratory Data:  Recent Labs    10/19/23 1927 10/20/23 0218  WBC 12.0* 11.3*  HGB 14.7 13.4  HCT 41.7 38.8*   Recent Labs    10/19/23 1927 10/20/23 0218  NA 139 138  K 3.9 4.2  CL 106 107  CO2 20* 25  GLUCOSE 114* 133*  BUN 18 16  CREATININE 1.03 1.06  CALCIUM  9.0 8.3*   Urinalysis    Component Value Date/Time   COLORURINE YELLOW (A) 10/19/2023 1944   APPEARANCEUR CLOUDY (A) 10/19/2023 1944   APPEARANCEUR Hazy (A) 06/09/2019 1324   LABSPEC 1.013 10/19/2023 1944   PHURINE 8.0 10/19/2023 1944   GLUCOSEU NEGATIVE 10/19/2023 1944   HGBUR SMALL (A) 10/19/2023 1944   BILIRUBINUR NEGATIVE  10/19/2023 1944   BILIRUBINUR Negative 06/09/2019 1324   KETONESUR NEGATIVE 10/19/2023 1944   PROTEINUR 100 (A) 10/19/2023 1944   NITRITE NEGATIVE 10/19/2023 1944   LEUKOCYTESUR LARGE (A) 10/19/2023 1944   Results for orders placed or performed during the hospital encounter of 10/19/23  Blood culture (routine x 2)     Status: None (Preliminary result)   Collection Time: 10/19/23  7:26 PM   Specimen: BLOOD  Result Value Ref Range Status   Specimen Description BLOOD BLOOD RIGHT HAND  Final   Special Requests   Final    BOTTLES DRAWN AEROBIC AND ANAEROBIC Blood Culture adequate volume   Culture   Final    NO GROWTH < 12 HOURS Performed at [facility], [address] [place], [place] [address]    Report Status PENDING  Incomplete  Blood culture (routine x 2)     Status: None (Preliminary result)   Collection Time: 10/19/23  9:29 PM   Specimen: BLOOD  Result Value Ref Range Status   Specimen Description BLOOD BLOOD LEFT ARM  Final   Special Requests   Final    BOTTLES DRAWN AEROBIC AND ANAEROBIC Blood Culture results may not be optimal due to an excessive volume of blood received in culture bottles   Culture  Setup Time   Final    GRAM NEGATIVE RODS IN BOTH AEROBIC AND ANAEROBIC BOTTLES Organism ID to follow CRITICAL RESULT CALLED TO, READ BACK BY AND VERIFIED WITH: LISA KLUTTZ PHARMD 1026 10/20/23 HNM    Culture   Final    NO GROWTH < 12 HOURS Performed at [facility], [address] [place], [place] [address]    Report Status PENDING  Incomplete  Blood Culture ID Panel (Reflexed)     Status: Abnormal   Collection Time: 10/19/23  9:29 PM  Result Value Ref Range Status   Enterococcus faecalis NOT DETECTED NOT DETECTED Final   Enterococcus Faecium NOT DETECTED NOT DETECTED Final   Listeria monocytogenes NOT DETECTED NOT DETECTED Final   Staphylococcus species  NOT DETECTED NOT DETECTED Final   Staphylococcus aureus (BCID) NOT DETECTED NOT DETECTED Final    Staphylococcus epidermidis NOT DETECTED NOT DETECTED Final   Staphylococcus lugdunensis NOT DETECTED NOT DETECTED Final   Streptococcus species NOT DETECTED NOT DETECTED Final   Streptococcus agalactiae NOT DETECTED NOT DETECTED Final   Streptococcus pneumoniae NOT DETECTED NOT DETECTED Final   Streptococcus pyogenes NOT DETECTED NOT DETECTED Final   A.calcoaceticus-baumannii NOT DETECTED NOT DETECTED Final   Bacteroides fragilis NOT DETECTED NOT DETECTED Final   Enterobacterales DETECTED (A) NOT DETECTED Final    Comment: CRITICAL RESULT CALLED TO, READ BACK BY AND VERIFIED WITH: LISA KLUTTZ PHARMD 1026 10/20/23 HNM    Enterobacter cloacae complex NOT DETECTED NOT DETECTED Final   Escherichia coli NOT DETECTED NOT DETECTED Final   Klebsiella aerogenes NOT DETECTED NOT DETECTED Final   Klebsiella oxytoca NOT DETECTED NOT DETECTED Final   Klebsiella pneumoniae DETECTED (A) NOT DETECTED Final    Comment: CRITICAL RESULT CALLED TO, READ BACK BY AND VERIFIED WITH: LISA KLUTTZ PHARMD 1026 10/20/23 HNM    Proteus species DETECTED (A) NOT DETECTED Final    Comment: CRITICAL RESULT CALLED TO, READ BACK BY AND VERIFIED WITH: LISA KLUTTZ PHARMD 1026 10/20/23 HNM    Salmonella species NOT DETECTED NOT DETECTED Final   Serratia marcescens NOT DETECTED NOT DETECTED Final   Haemophilus influenzae NOT DETECTED NOT DETECTED Final   Neisseria meningitidis NOT DETECTED NOT DETECTED Final   Pseudomonas aeruginosa NOT DETECTED NOT DETECTED Final   Stenotrophomonas maltophilia NOT DETECTED NOT DETECTED Final   Candida albicans NOT DETECTED NOT DETECTED Final   Candida auris NOT DETECTED NOT DETECTED Final   Candida glabrata NOT DETECTED NOT DETECTED Final   Candida krusei NOT DETECTED NOT DETECTED Final   Candida parapsilosis NOT DETECTED NOT DETECTED Final   Candida tropicalis NOT DETECTED NOT DETECTED Final   Cryptococcus neoformans/gattii NOT DETECTED NOT DETECTED Final   CTX-M ESBL DETECTED (A) NOT  DETECTED Final    Comment: CRITICAL RESULT CALLED TO, READ BACK BY AND VERIFIED WITH: LISA KLUTTZ PHARMD 1026 10/20/23 HNM (NOTE) Extended spectrum beta-lactamase detected. Recommend a carbapenem as initial therapy.      Carbapenem resistance IMP NOT DETECTED NOT DETECTED Final   Carbapenem resistance KPC NOT DETECTED NOT DETECTED Final   Carbapenem resistance NDM NOT DETECTED NOT DETECTED Final   Carbapenem resist OXA 48 LIKE NOT DETECTED NOT DETECTED Final   Carbapenem resistance VIM NOT DETECTED NOT DETECTED Final    Comment: Performed at [facility], [address], [place], [place] [address]    Radiologic Imaging: CT Angio Abd/Pel W and/or Wo Contrast Result Date: 10/19/2023 CLINICAL DATA:  Abdominal pain and vomiting, initial encounter EXAM: CTA ABDOMEN AND PELVIS WITHOUT AND WITH CONTRAST TECHNIQUE: Multidetector CT imaging of the abdomen and pelvis was performed using the standard protocol during bolus administration of intravenous contrast. Multiplanar reconstructed images and MIPs were obtained and reviewed to evaluate the vascular anatomy. RADIATION DOSE REDUCTION: This exam was performed according to the departmental dose-optimization program which includes automated exposure control, adjustment of the mA and/or kV according to patient size and/or use of iterative reconstruction technique. CONTRAST:  OMNIPAQUE  IOHEXOL  350 MG/ML SOLN COMPARISON:  04/14/2022 FINDINGS: VASCULAR Aorta: Abdominal aorta demonstrates minimal atherosclerotic calcifications. Celiac: Mild atherosclerotic calcifications the origin is noted without significant stenosis. SMA: Patent without evidence of aneurysm, dissection, vasculitis or significant stenosis. Renals: Both renal arteries are patent without evidence of aneurysm, dissection, vasculitis, fibromuscular dysplasia  or significant stenosis. IMA: Patent without evidence of aneurysm, dissection, vasculitis or significant stenosis. Inflow: Iliacs  are within normal limits.  No focal stenosis is seen. Veins: No specific venous abnormality is noted. Review of the MIP images confirms the above findings. NON-VASCULAR Lower chest: Mild bibasilar scarring is noted. Hepatobiliary: Dependent gallstone is noted within the gallbladder. No complicating factors are seen. The liver is within normal limits. Pancreas: Unremarkable. No pancreatic ductal dilatation or surrounding inflammatory changes. Spleen: Normal in size without focal abnormality. Adrenals/Urinary Tract: Adrenal glands are within normal limits. The right kidney shows a normal enhancement pattern. No renal calculi or obstructive changes are seen. Changes of prior cystectomy with ileal conduit and urostomy are seen. The left kidney demonstrates decreased enhancement compared to the right with significant hydronephrosis and hydroureter which extends to the anastomotic site of the ileal conduit. No definitive calculus is seen. Enhancement of the ureteral wall is noted as well as significant perinephric stranding. These changes likely represent underlying urinary tract infection. The overall appearance is similar to that seen on the prior CT examination from 2023. Stomach/Bowel: Scattered diverticular change of the colon is noted. No definitive diverticulitis is seen. The appendix is within normal limits. Small bowel and stomach are within normal limits. Lymphatic: No lymphadenopathy is noted. 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 Other: No abdominal wall hernia or abnormality. No abdominopelvic ascites. Musculoskeletal: No acute or significant osseous findings. IMPRESSION: VASCULAR No vascular abnormality to suggest mesenteric ischemia. NON-VASCULAR Changes in the left kidney similar to that seen on the prior exam with hydronephrosis and hydroureter extending to the anastomotic site to the ileal conduit. Ureteral enhancement and perinephric stranding is noted. These changes are suspicious for  underlying urinary tract infection. Likely long-standing narrowing at the anastomotic site is present as well. Diverticulosis without diverticulitis. Cholelithiasis. Electronically Signed   By: Violeta Grey M.D.   On: 10/19/2023 20:58   Assessment & Plan:  78 y.o. 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 Luster Salters at [facility] now admitted with sepsis due to left pyelonephritis.  He is clinically improving on empiric antibiotics.  Blood cultures are preliminarily growing multiple species with features c/w ESBL.  Nothing to add, agree with management per primary team. Outpt f/u with primary urology team. Would recommend keeping him inpatient until cultures finalize given his history of resistant organisms.  Thank you for involving me in this patient's care, please page with any further questions or concerns.  Shey Bartmess, PA-C 10/20/2023 12:38 PM

## 2023-10-20 NOTE — Progress Notes (Signed)
 Mobility Specialist - Progress Note   10/20/23 1418  Mobility  Activity Ambulated independently in hallway  Level of Assistance Independent  Assistive Device None (pushing IV pole)  Distance Ambulated (ft) 320 ft  Activity Response Tolerated well  Mobility visit 1 Mobility  Mobility Specialist Start Time (ACUTE ONLY) 1339  Mobility Specialist Stop Time (ACUTE ONLY) 1349  Mobility Specialist Time Calculation (min) (ACUTE ONLY) 10 min   Pt supine upon entry, utilizing RA. Pt agreeable to OOB amb this date. Pt completed bed mob, STS and and amb two laps around the NS indep. During amb Pt denied nausea, however endorsed a headache-- RN previously made aware. Pt returned to the room, left seated in the recliner with needs within reach. RN notified. Family present at bedside.  Versa Gore Mobility Specialist 10/20/23 2:21 PM

## 2023-10-20 NOTE — Progress Notes (Signed)
 [facility] Progress Note  Patient: Donald Barry    ZOX:[id number]  DOA: 10/19/2023     Date of Service: the patient was seen and examined on 10/20/2023  Chief Complaint  Patient presents with   Emesis   Brief hospital course: Donald Barry is a 78 y.o. 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  He follows up with [facility] for his urologic needs.   He presents to the emergency room on account of intractable left-sided abdominal pain, nausea and vomiting.  Onset of symptoms since about 3 PM today.  Denied any aggravating or relieving factors.  Denied any associated fever.  Denies seeing any cloudy urine.   On admission however workup revealed abnormal urinalysis consistent with UTI.  CT abdomen and pelvis ruled reviewed.  No large vessel occlusive disease.   Left kidney demonstrates decreased enhancement compared to the right with significant hydronephrosis and hydroureter which extends to the anastomotic site of the ileal conduit.  No definitive calculus was seen however.  Perinephric stranding was noted.  Patient was admitted with concern for possible pyelonephritis.   Assessment and Plan:  Sepsis due to acute left pyelonephritis Complex UTI History of cystectomy and ileostomy conduit  Klebsiella and Proteus bacteremia Tachycardia, leukocytosis and UTI UA positive, urine culture growing Klebsiella ESBL and Proteus S/p cefepime , switched to meropenem ID consulted, recommended to wait for sensitivity report for oral options otherwise patient may need midline and IV antibiotics.  Will consider restarting Bactrim for suppression of sensitivity Patient was recommended to follow with ID and urologist at [facility] Follow ID if need to repeat blood cultures?   # Paroxysmal A-fib Metoprolol  25 mg p.o. twice daily and  Eliquis  5 mg p.o. twice daily 6/9 BP soft, skipped Lopressor  today Monitor BP & HR and  titrate medications accordingly Continue IV fluid for hydration  # Hyperlipidemia: Lipitor 10 mg.  Daily  #Hypomagnesemia, mag repleted.   Body mass index is 27.12 kg/m.  Interventions:  Diet: Heart healthy diet DVT Prophylaxis: Therapeutic Anticoagulation with Eliquis    Advance goals of care discussion: Full code  Family Communication: family was present at bedside, at the time of interview.  The pt provided permission to discuss medical plan with the family. Opportunity was given to ask question and all questions were answered satisfactorily.   Disposition:  Pt is from home, admitted with sepsis due to left pyelonephritis, still on IV antibiotics, complete culture report is pending, which precludes a safe discharge. Discharge to home, when stable and cleared by ID.  Subjective: No significant events overnight, patient still has left flank pain 2/10, gradually improving, no any other specific complaints.  Physical Exam: General: NAD, lying comfortably Appear in no distress, affect appropriate Eyes: PERRLA ENT: Oral Mucosa Clear, moist  Neck: no JVD,  Cardiovascular: S1 and S2 Present, no Murmur,  Respiratory: good respiratory effort, Bilateral Air entry equal and Decreased, no Crackles, no wheezes Abdomen: Bowel Sound present, Soft and mild Lt flank tenderness,  Skin: no rashes Extremities: no Pedal edema, no calf tenderness Neurologic: without any new focal findings Gait not checked due to patient safety concerns  Vitals:   10/20/23 0739 10/20/23 0739 10/20/23 1546 10/20/23 1546  BP: 99/66 99/66 95/60  95/60  Pulse: 76 76 74 75  Resp: 16 16 16 16   Temp: 99.4 F (37.4 C)  99.4 F (37.4 C)    TempSrc:      SpO2: 94% 94% 96% 96%  Weight:      Height:        Intake/Output Summary (Last 24 hours) at 10/20/2023 1622 Last data filed at 10/20/2023 1603 Gross per 24 hour  Intake  1610.33 ml  Output 1525 ml  Net 85.33 ml   Filed Weights   10/19/23 2256  Weight: 90.7 kg    Data Reviewed: I have personally reviewed and interpreted daily labs, tele strips, imagings as discussed above. I reviewed all nursing notes, pharmacy notes, vitals, pertinent old records I have discussed plan of care as described above with RN and patient/family.  CBC: Recent Labs  Lab 10/19/23 1927 10/20/23 0218  WBC 12.0* 11.3*  NEUTROABS 10.9*  --   HGB 14.7 13.4  HCT 41.7 38.8*  MCV 92.5 93.7  PLT 152 124*   Basic Metabolic Panel: Recent Labs  Lab 10/19/23 1927 10/20/23 0218  NA 139 138  K 3.9 4.2  CL 106 107  CO2 20* 25  GLUCOSE 114* 133*  BUN 18 16  CREATININE 1.03 1.06  CALCIUM  9.0 8.3*  MG  --  1.6*  PHOS  --  3.3    Studies: CT Angio Abd/Pel W and/or Wo Contrast Result Date: 10/19/2023 CLINICAL DATA:  Abdominal pain and vomiting, initial encounter EXAM: CTA ABDOMEN AND PELVIS WITHOUT AND WITH CONTRAST TECHNIQUE: Multidetector CT imaging of the abdomen and pelvis was performed using the standard protocol during bolus administration of intravenous contrast. Multiplanar reconstructed images and MIPs were obtained and reviewed to evaluate the vascular anatomy. RADIATION DOSE REDUCTION: This exam was performed according to the departmental dose-optimization program which includes automated exposure control, adjustment of the mA and/or kV according to patient size and/or use of iterative reconstruction technique. CONTRAST:  OMNIPAQUE  IOHEXOL  350 MG/ML SOLN COMPARISON:  04/14/2022 FINDINGS: VASCULAR Aorta: Abdominal aorta demonstrates minimal atherosclerotic calcifications. Celiac: Mild atherosclerotic calcifications the origin is noted without significant stenosis. SMA: Patent without evidence of aneurysm, dissection, vasculitis or significant stenosis. Renals: Both renal arteries are patent without evidence of aneurysm, dissection, vasculitis, fibromuscular dysplasia or  significant stenosis. IMA: Patent without evidence of aneurysm, dissection, vasculitis or significant stenosis. Inflow: Iliacs are within normal limits.  No focal stenosis is seen. Veins: No specific venous abnormality is noted. Review of the MIP images confirms the above findings. NON-VASCULAR Lower chest: Mild bibasilar scarring is noted. Hepatobiliary: Dependent gallstone is noted within the gallbladder. No complicating factors are seen. The liver is within normal limits. Pancreas: Unremarkable. No pancreatic ductal dilatation or surrounding inflammatory changes. Spleen: Normal in size without focal abnormality. Adrenals/Urinary Tract: Adrenal glands are within normal limits. The right kidney shows a normal enhancement pattern. No renal calculi or obstructive changes are seen. Changes of prior cystectomy with ileal conduit and urostomy are seen. The left kidney demonstrates decreased enhancement compared to the right with significant hydronephrosis and hydroureter which extends to the anastomotic site of the ileal conduit. No definitive calculus is seen. Enhancement of the ureteral wall is noted as well as significant perinephric stranding. These changes likely represent underlying urinary tract infection. The overall appearance is similar to that seen on the prior CT examination from 2023. Stomach/Bowel: Scattered diverticular change of the colon is noted. No definitive diverticulitis is seen. The appendix is within normal limits. Small bowel and stomach are within normal limits. Lymphatic: No lymphadenopathy is noted. 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 Other: No abdominal wall hernia  or abnormality. No abdominopelvic ascites. Musculoskeletal: No acute or significant osseous findings. IMPRESSION: VASCULAR No vascular abnormality to suggest mesenteric ischemia. NON-VASCULAR Changes in the left kidney similar to that seen on the prior exam with hydronephrosis and hydroureter extending to the anastomotic  site to the ileal conduit. Ureteral enhancement and perinephric stranding is noted. These changes are suspicious for underlying urinary tract infection. Likely long-standing narrowing at the anastomotic site is present as well. Diverticulosis without diverticulitis. Cholelithiasis. Electronically Signed   By: Violeta Grey M.D.   On: 10/19/2023 20:58    Scheduled Meds:  apixaban   5 mg Oral BID   atorvastatin   10 mg Oral Daily   metoprolol  tartrate  25 mg Oral BID   traZODone   100 mg Oral QHS   Continuous Infusions:  lactated ringers  75 mL/hr at 10/20/23 1234   meropenem (MERREM) IV 1 g (10/20/23 1234)   PRN Meds: acetaminophen  **OR** acetaminophen , albuterol, fluticasone , guaiFENesin, metoprolol  tartrate, ondansetron  **OR** ondansetron  (ZOFRAN ) IV, oxyCODONE   Time spent: 55 minutes  Author: Althia Atlas. MD [facility] 10/20/2023 4:22 PM  To reach On-call, see care teams to locate the attending and reach out to them via [email or web address]. If 7PM-7AM, please contact night-coverage If you still have difficulty reaching the attending provider, please page the [facility] (Director on Call) for [facility] on amion for assistance.

## 2023-10-21 DIAGNOSIS — N390 Urinary tract infection, site not specified: Secondary | ICD-10-CM | POA: Diagnosis not present

## 2023-10-21 DIAGNOSIS — A4150 Gram-negative sepsis, unspecified: Secondary | ICD-10-CM | POA: Diagnosis not present

## 2023-10-21 LAB — URINE CULTURE

## 2023-10-21 LAB — BASIC METABOLIC PANEL WITH GFR
Anion gap: 9 (ref 5–15)
BUN: 14 mg/dL (ref 8–23)
CO2: 24 mmol/L (ref 22–32)
Calcium: 8.4 mg/dL — ABNORMAL LOW (ref 8.9–10.3)
Chloride: 107 mmol/L (ref 98–111)
Creatinine, Ser: 0.88 mg/dL (ref 0.61–1.24)
GFR, Estimated: >60 mL/min (ref >60)
Glucose, Bld: 129 mg/dL — ABNORMAL HIGH (ref 70–99)
Potassium: 3.6 mmol/L (ref 3.5–5.1)
Sodium: 140 mmol/L (ref 135–145)

## 2023-10-21 LAB — PHOSPHORUS: Phosphorus: 2.6 mg/dL (ref 2.5–4.6)

## 2023-10-21 LAB — CBC
HCT: 36.7 % — ABNORMAL LOW (ref 39.0–52.0)
Hemoglobin: 12.7 g/dL — ABNORMAL LOW (ref 13.0–17.0)
MCH: 32.8 pg (ref 26.0–34.0)
MCHC: 34.6 g/dL (ref 30.0–36.0)
MCV: 94.8 fL (ref 80.0–100.0)
Platelets: 116 10*3/uL — ABNORMAL LOW (ref 150–400)
RBC: 3.87 MIL/uL — ABNORMAL LOW (ref 4.22–5.81)
RDW: 13.1 % (ref 11.5–15.5)
WBC: 7.7 10*3/uL (ref 4.0–10.5)
nRBC: 0 % (ref 0.0–0.2)

## 2023-10-21 LAB — MAGNESIUM: Magnesium: 2.1 mg/dL (ref 1.7–2.4)

## 2023-10-21 MED ORDER — BISACODYL 5 MG PO TBEC
10.0000 mg | DELAYED_RELEASE_TABLET | Freq: Every day | ORAL | Status: DC
  Administered 2023-10-21: 10 mg via ORAL
  Filled 2023-10-21 (×2): qty 2

## 2023-10-21 MED ORDER — VITAMIN B-12 1000 MCG PO TABS: 1000.0000 ug | ORAL_TABLET | Freq: Every day | ORAL | Status: DC

## 2023-10-21 MED ORDER — POLYETHYLENE GLYCOL 3350 17 G PO PACK
17.0000 g | PACK | Freq: Two times a day (BID) | ORAL | Status: DC
  Administered 2023-10-21 (×2): 17 g via ORAL
  Filled 2023-10-21 (×4): qty 1

## 2023-10-21 MED ORDER — CYANOCOBALAMIN 1000 MCG/ML IJ SOLN
1000.0000 ug | Freq: Every day | INTRAMUSCULAR | Status: DC
  Administered 2023-10-21 – 2023-10-23 (×3): 1000 ug via INTRAMUSCULAR
  Filled 2023-10-21 (×3): qty 1

## 2023-10-21 MED ORDER — BISACODYL 10 MG RE SUPP: 10.0000 mg | Freq: Every day | RECTAL | Status: DC | PRN

## 2023-10-21 NOTE — Progress Notes (Signed)
 [facility] Progress Note  Patient: Donald Barry    ZOX:[id number]  DOA: 10/19/2023     Date of Service: the patient was seen and examined on 10/21/2023  Chief Complaint  Patient presents with   Emesis   Brief hospital course: Donald Barry is a 78 y.o. 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  He follows up with [facility] for his urologic needs.   He presents to the emergency room on account of intractable left-sided abdominal pain, nausea and vomiting.  Onset of symptoms since about 3 PM today.  Denied any aggravating or relieving factors.  Denied any associated fever.  Denies seeing any cloudy urine.   On admission however workup revealed abnormal urinalysis consistent with UTI.  CT abdomen and pelvis ruled reviewed.  No large vessel occlusive disease.   Left kidney demonstrates decreased enhancement compared to the right with significant hydronephrosis and hydroureter which extends to the anastomotic site of the ileal conduit.  No definitive calculus was seen however.  Perinephric stranding was noted.  Patient was admitted with concern for possible pyelonephritis.   Assessment and Plan:  Sepsis due to acute left pyelonephritis Complex UTI History of cystectomy and ileostomy conduit  Klebsiella and Proteus bacteremia Tachycardia, leukocytosis and UTI UA positive, urine culture growing Klebsiella ESBL and Proteus S/p cefepime , switched to meropenem ID consulted, recommended to wait for sensitivity report for oral options otherwise patient may need midline and IV antibiotics.  Will consider restarting Bactrim for suppression of sensitivity Patient was recommended to follow with ID and urologist at [facility] Follow ID if need to repeat blood cultures?   # Paroxysmal A-fib Metoprolol  25 mg p.o. twice daily and  Eliquis  5 mg p.o. twice daily 6/9 BP soft, skipped Lopressor  today Monitor BP & HR and  titrate medications accordingly Continue IV fluid for hydration  # Hyperlipidemia: Lipitor 10 mg.  Daily  # Hypomagnesemia, mag repleted.  # Vitamin B12 deficiency: b12 level 157 and goal is >400, Started vitamin B12 1000 mcg IM injection daily during hospital stay, followed by oral supplement.  Follow-up PCP to repeat vitamin B12 level after 3 to 6 months.   Body mass index is 27.12 kg/m.  Interventions:  Diet: Heart healthy diet DVT Prophylaxis: Therapeutic Anticoagulation with Eliquis    Advance goals of care discussion: Full code  Family Communication: family was present at bedside, at the time of interview.  The pt provided permission to discuss medical plan with the family. Opportunity was given to ask question and all questions were answered satisfactorily.   Disposition:  Pt is from home, admitted with sepsis due to left pyelonephritis, still on IV antibiotics, complete culture report is pending, which precludes a safe discharge. Discharge to home, when stable and cleared by ID.  Subjective: No significant events overnight, patient was sitting out of bed in the recliner, denied any pain.  Physical Exam: General: NAD, lying comfortably Appear in no distress, affect appropriate Eyes: PERRLA ENT: Oral Mucosa Clear, moist  Neck: no JVD,  Cardiovascular: S1 and S2 Present, no Murmur,  Respiratory: good respiratory effort, Bilateral Air entry equal and Decreased, no Crackles, no wheezes Abdomen: Bowel Sound present, Soft and mild Lt flank tenderness,  Skin: no rashes Extremities: no Pedal edema, no calf tenderness Neurologic: without any new focal findings Gait not checked due to  patient safety concerns  Vitals:   10/20/23 1546 10/20/23 1927 10/21/23 0421 10/21/23 0729  BP: 95/60 111/67 117/72 116/73  Pulse: 75 92 80 75  Resp: 16  16 16   Temp:  99.1 F (37.3 C) 99 F (37.2 C)  98.1 F (36.7 C)  TempSrc:  Oral    SpO2: 96% 96% 93% 95%  Weight:      Height:        Intake/Output Summary (Last 24 hours) at 10/21/2023 1537 Last data filed at 10/21/2023 1026 Gross per 24 hour  Intake 240 ml  Output 3425 ml  Net -3185 ml   Filed Weights   10/19/23 2256  Weight: 90.7 kg    Data Reviewed: I have personally reviewed and interpreted daily labs, tele strips, imagings as discussed above. I reviewed all nursing notes, pharmacy notes, vitals, pertinent old records I have discussed plan of care as described above with RN and patient/family.  CBC: Recent Labs  Lab 10/19/23 1927 10/20/23 0218 10/21/23 1106  WBC 12.0* 11.3* 7.7  NEUTROABS 10.9*  --   --   HGB 14.7 13.4 12.7*  HCT 41.7 38.8* 36.7*  MCV 92.5 93.7 94.8  PLT 152 124* 116*   Basic Metabolic Panel: Recent Labs  Lab 10/19/23 1927 10/20/23 0218 10/21/23 1106  NA 139 138 140  K 3.9 4.2 3.6  CL 106 107 107  CO2 20* 25 24  GLUCOSE 114* 133* 129*  BUN 18 16 14   CREATININE 1.03 1.06 1.61  CALCIUM  9.0 8.3* 8.4*  MG  --  1.6* 2.1  PHOS  --  3.3 2.6    Studies: No results found.   Scheduled Meds:  apixaban   5 mg Oral BID   atorvastatin   10 mg Oral Daily   bisacodyl  10 mg Oral QHS   cyanocobalamin  1,000 mcg Intramuscular Daily   Followed by   Cecily Cohen ON 10/28/2023] vitamin B-12  1,000 mcg Oral Daily   metoprolol  tartrate  25 mg Oral BID   polyethylene glycol  17 g Oral BID   traZODone   50 mg Oral QHS   Continuous Infusions:  meropenem (MERREM) IV 1 g (10/21/23 1319)   PRN Meds: acetaminophen  **OR** acetaminophen , albuterol, bisacodyl, fluticasone , guaiFENesin, metoprolol  tartrate, ondansetron  **OR** ondansetron  (ZOFRAN ) IV, oxyCODONE   Time spent: 40 minutes  Author: Althia Atlas. MD [facility] 10/21/2023 3:37 PM  To reach On-call, see care teams to locate the attending and reach out to them via [email or web address]. If 7PM-7AM, please contact night-coverage If you still have  difficulty reaching the attending provider, please page the [facility] (Director on Call) for [facility] on amion for assistance.

## 2023-10-21 NOTE — Plan of Care (Signed)
 Tamea Faith, LPN

## 2023-10-21 NOTE — Plan of Care (Signed)

## 2023-10-22 DIAGNOSIS — N390 Urinary tract infection, site not specified: Secondary | ICD-10-CM | POA: Diagnosis not present

## 2023-10-22 DIAGNOSIS — A4150 Gram-negative sepsis, unspecified: Secondary | ICD-10-CM | POA: Diagnosis not present

## 2023-10-22 LAB — BASIC METABOLIC PANEL WITH GFR
Anion gap: 6 (ref 5–15)
BUN: 12 mg/dL (ref 8–23)
CO2: 27 mmol/L (ref 22–32)
Calcium: 8.6 mg/dL — ABNORMAL LOW (ref 8.9–10.3)
Chloride: 107 mmol/L (ref 98–111)
Creatinine, Ser: 0.85 mg/dL (ref 0.61–1.24)
GFR, Estimated: >60 mL/min (ref >60)
Glucose, Bld: 101 mg/dL — ABNORMAL HIGH (ref 70–99)
Potassium: 3.8 mmol/L (ref 3.5–5.1)
Sodium: 140 mmol/L (ref 135–145)

## 2023-10-22 LAB — CBC
HCT: 34.7 % — ABNORMAL LOW (ref 39.0–52.0)
Hemoglobin: 12.1 g/dL — ABNORMAL LOW (ref 13.0–17.0)
MCH: 32.7 pg (ref 26.0–34.0)
MCHC: 34.9 g/dL (ref 30.0–36.0)
MCV: 93.8 fL (ref 80.0–100.0)
Platelets: 112 10*3/uL — ABNORMAL LOW (ref 150–400)
RBC: 3.7 MIL/uL — ABNORMAL LOW (ref 4.22–5.81)
RDW: 12.8 % (ref 11.5–15.5)
WBC: 5.9 10*3/uL (ref 4.0–10.5)
nRBC: 0 % (ref 0.0–0.2)

## 2023-10-22 LAB — PHOSPHORUS: Phosphorus: 2.6 mg/dL (ref 2.5–4.6)

## 2023-10-22 LAB — MAGNESIUM: Magnesium: 2 mg/dL (ref 1.7–2.4)

## 2023-10-22 MED ORDER — ALUM & MAG HYDROXIDE-SIMETH 200-200-20 MG/5ML PO SUSP
30.0000 mL | ORAL | Status: DC | PRN
  Administered 2023-10-22: 30 mL via ORAL
  Filled 2023-10-22: qty 30

## 2023-10-22 NOTE — Progress Notes (Signed)
 [facility] Progress Note  Patient: Donald Barry    YQI:[id number]  DOA: 10/19/2023     Date of Service: the patient was seen and examined on 10/22/2023  Chief Complaint  Patient presents with   Emesis   Brief hospital course: Donald Barry is a 78 y.o. 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  He follows up with [facility] for his urologic needs.   He presents to the emergency room on account of intractable left-sided abdominal pain, nausea and vomiting.  Onset of symptoms since about 3 PM today.  Denied any aggravating or relieving factors.  Denied any associated fever.  Denies seeing any cloudy urine.   On admission however workup revealed abnormal urinalysis consistent with UTI.  CT abdomen and pelvis ruled reviewed.  No large vessel occlusive disease.   Left kidney demonstrates decreased enhancement compared to the right with significant hydronephrosis and hydroureter which extends to the anastomotic site of the ileal conduit.  No definitive calculus was seen however.  Perinephric stranding was noted.  Patient was admitted with concern for possible pyelonephritis.   Assessment and Plan:  Sepsis due to acute left pyelonephritis Complex UTI History of cystectomy and ileostomy conduit  Klebsiella and Proteus bacteremia Tachycardia, leukocytosis and UTI UA positive, Ucx growing multiple species, suggested recollection. Blood culture growing Klebsiella ESBL and Proteus S/p cefepime , switched to meropenem ID consulted, recommended to wait for sensitivity report for oral options otherwise patient may need midline and IV antibiotics.  Will consider restarting Bactrim for suppression of sensitivity Patient was recommended to follow with ID and urologist at [facility] Follow ID if need to repeat blood cultures?   #  Paroxysmal A-fib Metoprolol  25 mg p.o. twice daily and Eliquis  5 mg p.o. twice daily 6/9 BP soft, skipped Lopressor  today Monitor BP & HR and  titrate medications accordingly Continue IV fluid for hydration  # Hyperlipidemia: Lipitor 10 mg.  Daily  # Hypomagnesemia, mag repleted.  # Vitamin B12 deficiency: b12 level 157 and goal is >400, Started vitamin B12 1000 mcg IM injection daily during hospital stay, followed by oral supplement.  Follow-up PCP to repeat vitamin B12 level after 3 to 6 months.   Body mass index is 27.12 kg/m.  Interventions:  Diet: Heart healthy diet DVT Prophylaxis: Therapeutic Anticoagulation with Eliquis    Advance goals of care discussion: Full code  Family Communication: family was present at bedside, at the time of interview.  The pt provided permission to discuss medical plan with the family. Opportunity was given to ask question and all questions were answered satisfactorily.   Disposition:  Pt is from home, admitted with sepsis due to left pyelonephritis, still on IV antibiotics, complete culture report is pending, which precludes a safe discharge. Discharge to home, when stable and cleared by ID. 6/11 awaiting for blood culture sensitivity report   Subjective: No significant events overnight, patient was sitting out of bed in the recliner, denied any pain.  Physical Exam: General: NAD, lying comfortably Appear in no distress, affect appropriate Eyes: PERRLA ENT: Oral Mucosa Clear, moist  Neck: no JVD,  Cardiovascular: S1 and S2 Present, no Murmur,  Respiratory: good respiratory effort, Bilateral Air entry equal and Decreased, no Crackles, no wheezes Abdomen: Bowel Sound present, Soft and mild Lt flank tenderness,  Skin: no rashes Extremities: no Pedal edema,  no calf tenderness Neurologic: without any new focal findings Gait not checked due to patient safety concerns  Vitals:   10/21/23 1717 10/21/23 2013 10/22/23 0510 10/22/23 0732  BP:  125/81 123/73 130/79 133/82  Pulse: 82 77 76 70  Resp: 16 16 18 16   Temp: 99 F (37.2 C) 99.3 F (37.4 C) 97.9 F (36.6 C) 99.3 F (37.4 C)  TempSrc:  Oral Oral   SpO2: 100% 98% 94% 96%  Weight:      Height:        Intake/Output Summary (Last 24 hours) at 10/22/2023 1111 Last data filed at 10/22/2023 1044 Gross per 24 hour  Intake 665.01 ml  Output 2150 ml  Net -1484.99 ml   Filed Weights   10/19/23 2256  Weight: 90.7 kg    Data Reviewed: I have personally reviewed and interpreted daily labs, tele strips, imagings as discussed above. I reviewed all nursing notes, pharmacy notes, vitals, pertinent old records I have discussed plan of care as described above with RN and patient/family.  CBC: Recent Labs  Lab 10/19/23 1927 10/20/23 0218 10/21/23 1106 10/22/23 0555  WBC 12.0* 11.3* 7.7 5.9  NEUTROABS 10.9*  --   --   --   HGB 14.7 13.4 12.7* 12.1*  HCT 41.7 38.8* 36.7* 34.7*  MCV 92.5 93.7 94.8 93.8  PLT 152 124* 116* 112*   Basic Metabolic Panel: Recent Labs  Lab 10/19/23 1927 10/20/23 0218 10/21/23 1106 10/22/23 0555  NA 139 138 140 140  K 3.9 4.2 3.6 3.8  CL 106 107 107 107  CO2 20* 25 24 27   GLUCOSE 114* 133* 129* 101*  BUN 18 16 14 12   CREATININE 1.03 1.06 0.88 0.85  CALCIUM  9.0 8.3* 8.4* 8.6*  MG  --  1.6* 2.1 2.0  PHOS  --  3.3 2.6 2.6    Studies: No results found.   Scheduled Meds:  apixaban   5 mg Oral BID   atorvastatin   10 mg Oral Daily   bisacodyl  10 mg Oral QHS   cyanocobalamin  1,000 mcg Intramuscular Daily   Followed by   Cecily Cohen ON 10/28/2023] vitamin B-12  1,000 mcg Oral Daily   metoprolol  tartrate  25 mg Oral BID   polyethylene glycol  17 g Oral BID   Continuous Infusions:  meropenem (MERREM) IV 1 g (10/22/23 0602)   PRN Meds: acetaminophen  **OR** acetaminophen , albuterol, bisacodyl, fluticasone , guaiFENesin, metoprolol  tartrate, ondansetron  **OR** ondansetron  (ZOFRAN ) IV, oxyCODONE   Time spent: 40 minutes  Author: Althia Atlas. MD [facility] 10/22/2023 11:11 AM  To reach On-call, see care teams to locate the attending and reach out to them via [email or web address]. If 7PM-7AM, please contact night-coverage If you still have difficulty reaching the attending provider, please page the [facility] (Director on Call) for [facility] on amion for assistance.

## 2023-10-22 NOTE — Care Management Important Message (Signed)
 Important Message  Patient Details  Name: Donald Barry MRN: [id number] Date of Birth: 1945-07-27   Important Message Given:  Yes - Medicare IM     Anise Kerns 10/22/2023, 3:19 PM

## 2023-10-22 NOTE — Plan of Care (Signed)
   Problem: Education: Goal: Knowledge of General Education information will improve Description: Including pain rating scale, medication(s)/side effects and non-pharmacologic comfort measures Outcome: Progressing   Problem: Activity: Goal: Risk for activity intolerance will decrease Outcome: Progressing   Problem: Nutrition: Goal: Adequate nutrition will be maintained Outcome: Progressing

## 2023-10-22 NOTE — Progress Notes (Signed)
 INFECTIOUS DISEASE PROGRESS NOTE Date of Admission:  10/19/2023     ID: Donald Barry is a 78 y.o. male with  ESBL polymicrobial gram neg bacteremia and pyelo Principal Problem:   Sepsis due to gram-negative UTI (HCC)   Subjective: NO fevers, wbc down Cx pending still  Day 3 of meropenem  ROS  Eleven systems are reviewed and negative except per hpi  Medications:  Antibiotics Given (last 72 hours)     Date/Time Action Medication Dose Rate   10/19/23 2125 New Bag/Given   ceFEPIme  (MAXIPIME ) 2 g in sodium chloride  0.9 % 100 mL IVPB 2 g 200 mL/hr   10/20/23 0457 New Bag/Given   cefTRIAXone  (ROCEPHIN ) 2 g in sodium chloride  0.9 % 100 mL IVPB 2 g 200 mL/hr   10/20/23 1234 New Bag/Given   meropenem (MERREM) 1 g in sodium chloride  0.9 % 100 mL IVPB 1 g 200 mL/hr   10/20/23 2144 New Bag/Given   meropenem (MERREM) 1 g in sodium chloride  0.9 % 100 mL IVPB 1 g 200 mL/hr   10/21/23 0601 New Bag/Given   meropenem (MERREM) 1 g in sodium chloride  0.9 % 100 mL IVPB 1 g 200 mL/hr   10/21/23 1319 New Bag/Given   meropenem (MERREM) 1 g in sodium chloride  0.9 % 100 mL IVPB 1 g 200 mL/hr   10/21/23 2138 New Bag/Given   meropenem (MERREM) 1 g in sodium chloride  0.9 % 100 mL IVPB 1 g 200 mL/hr   10/22/23 0602 New Bag/Given   meropenem (MERREM) 1 g in sodium chloride  0.9 % 100 mL IVPB 1 g 200 mL/hr       apixaban   5 mg Oral BID   atorvastatin   10 mg Oral Daily   bisacodyl  10 mg Oral QHS   cyanocobalamin  1,000 mcg Intramuscular Daily   Followed by   Cecily Cohen ON 10/28/2023] vitamin B-12  1,000 mcg Oral Daily   metoprolol  tartrate  25 mg Oral BID   polyethylene glycol  17 g Oral BID    Objective: Vital signs in last 24 hours: Temp:  [97.9 F (36.6 C)-99.3 F (37.4 C)] 99.3 F (37.4 C) (06/11 0732) Pulse Rate:  [70-82] 70 (06/11 0732) Resp:  [16-18] 16 (06/11 0732) BP: (123-133)/(73-82) 133/82 (06/11 0732) SpO2:  [94 %-100 %] 96 % (06/11 0732) FiO2 (%):  [21 %] 21 % (06/10  1912) Constitutional: He is oriented to person, place, and time. He appears well-developed and well-nourished. No distress.  HENT: anicteri Mouth/Throat: Oropharynx is clear and moist. No oropharyngeal exudate.  Cardiovascular: Normal rate, regular rhythm and normal heart sounds. Exam reveals no gallop and no friction rub. No murmur heard.  Pulmonary/Chest: Effort normal and breath sounds normal. No respiratory distress. He has no wheezes.  Abdominal: Soft. Bowel sounds are normal. He exhibits no distension. There is mild TTP LLQ. Ileastomy site wnl Lymphadenopathy:  He has no cervical adenopathy.  Neurological: He is alert and oriented to person, place, and time.  Skin: Skin is warm and dry. No rash noted. No erythema.  Psychiatric: He has a normal mood and affect. His behavior is normal.   Lab Results Recent Labs    10/21/23 1106 10/22/23 0555  WBC 7.7 5.9  HGB 12.7* 12.1*  HCT 36.7* 34.7*  NA 140 140  K 3.6 3.8  CL 107 107  CO2 24 27  BUN 14 12  CREATININE 0.88 0.85    Microbiology: Results for orders placed or performed during the hospital encounter of  10/19/23  Blood culture (routine x 2)     Status: None (Preliminary result)   Collection Time: 10/19/23  7:26 PM   Specimen: BLOOD  Result Value Ref Range Status   Specimen Description BLOOD BLOOD RIGHT HAND  Final   Special Requests   Final    BOTTLES DRAWN AEROBIC AND ANAEROBIC Blood Culture adequate volume   Culture   Final    NO GROWTH 3 DAYS Performed at [facility], [address] [place], [place] [address]    Report Status PENDING  Incomplete  Urine Culture (for pregnant, neutropenic or urologic patients or patients with an indwelling urinary catheter)     Status: Abnormal   Collection Time: 10/19/23  7:44 PM   Specimen: Urine, Clean Catch  Result Value Ref Range Status   Specimen Description   Final    URINE, CLEAN CATCH Performed at [facility], [address], [place], [place]  [address]    Special Requests   Final    NONE Performed at [facility], [address] [place], [place] [address]    Culture MULTIPLE SPECIES PRESENT, SUGGEST RECOLLECTION (A)  Final   Report Status 10/21/2023 FINAL  Final  Blood culture (routine x 2)     Status: Abnormal (Preliminary result)   Collection Time: 10/19/23  9:29 PM   Specimen: BLOOD LEFT ARM  Result Value Ref Range Status   Specimen Description   Final    BLOOD LEFT ARM Performed at [facility], [address] [place], [place] [address]    Special Requests   Final    BOTTLES DRAWN AEROBIC AND ANAEROBIC Blood Culture results may not be optimal due to an excessive volume of blood received in culture bottles Performed at [facility], [address] [place], [place] [address]    Culture  Setup Time   Final    GRAM NEGATIVE RODS IN BOTH AEROBIC AND ANAEROBIC BOTTLES Organism ID to follow CRITICAL RESULT CALLED TO, READ BACK BY AND VERIFIED WITH: LISA KLUTTZ PHARMD 1026 10/20/23 HNM    Culture (A)  Final    KLEBSIELLA PNEUMONIAE PROTEUS MIRABILIS SUSCEPTIBILITIES TO FOLLOW Performed at [facility], [address], [place], [place] [address]    Report Status PENDING  Incomplete  Blood Culture ID Panel (Reflexed)     Status: Abnormal   Collection Time: 10/19/23  9:29 PM  Result Value Ref Range Status   Enterococcus faecalis NOT DETECTED NOT DETECTED Final   Enterococcus Faecium NOT DETECTED NOT DETECTED Final   Listeria monocytogenes NOT DETECTED NOT DETECTED Final   Staphylococcus species NOT DETECTED NOT DETECTED Final   Staphylococcus aureus (BCID) NOT DETECTED NOT DETECTED Final   Staphylococcus epidermidis NOT DETECTED NOT DETECTED Final   Staphylococcus lugdunensis NOT DETECTED NOT DETECTED Final   Streptococcus species NOT DETECTED NOT DETECTED Final   Streptococcus agalactiae NOT DETECTED NOT DETECTED Final   Streptococcus pneumoniae NOT DETECTED NOT DETECTED Final    Streptococcus pyogenes NOT DETECTED NOT DETECTED Final   A.calcoaceticus-baumannii NOT DETECTED NOT DETECTED Final   Bacteroides fragilis NOT DETECTED NOT DETECTED Final   Enterobacterales DETECTED (A) NOT DETECTED Final    Comment: CRITICAL RESULT CALLED TO, READ BACK BY AND VERIFIED WITH: LISA KLUTTZ PHARMD 1026 10/20/23 HNM    Enterobacter cloacae complex NOT DETECTED NOT DETECTED Final   Escherichia coli NOT DETECTED NOT DETECTED Final   Klebsiella aerogenes NOT DETECTED NOT DETECTED Final   Klebsiella oxytoca NOT DETECTED  NOT DETECTED Final   Klebsiella pneumoniae DETECTED (A) NOT DETECTED Final    Comment: CRITICAL RESULT CALLED TO, READ BACK BY AND VERIFIED WITH: LISA KLUTTZ PHARMD 1026 10/20/23 HNM    Proteus species DETECTED (A) NOT DETECTED Final    Comment: CRITICAL RESULT CALLED TO, READ BACK BY AND VERIFIED WITH: LISA KLUTTZ PHARMD 1026 10/20/23 HNM    Salmonella species NOT DETECTED NOT DETECTED Final   Serratia marcescens NOT DETECTED NOT DETECTED Final   Haemophilus influenzae NOT DETECTED NOT DETECTED Final   Neisseria meningitidis NOT DETECTED NOT DETECTED Final   Pseudomonas aeruginosa NOT DETECTED NOT DETECTED Final   Stenotrophomonas maltophilia NOT DETECTED NOT DETECTED Final   Candida albicans NOT DETECTED NOT DETECTED Final   Candida auris NOT DETECTED NOT DETECTED Final   Candida glabrata NOT DETECTED NOT DETECTED Final   Candida krusei NOT DETECTED NOT DETECTED Final   Candida parapsilosis NOT DETECTED NOT DETECTED Final   Candida tropicalis NOT DETECTED NOT DETECTED Final   Cryptococcus neoformans/gattii NOT DETECTED NOT DETECTED Final   CTX-M ESBL DETECTED (A) NOT DETECTED Final    Comment: CRITICAL RESULT CALLED TO, READ BACK BY AND VERIFIED WITH: LISA KLUTTZ PHARMD 1026 10/20/23 HNM (NOTE) Extended spectrum beta-lactamase detected. Recommend a carbapenem as initial therapy.      Carbapenem resistance IMP NOT DETECTED NOT DETECTED Final   Carbapenem  resistance KPC NOT DETECTED NOT DETECTED Final   Carbapenem resistance NDM NOT DETECTED NOT DETECTED Final   Carbapenem resist OXA 48 LIKE NOT DETECTED NOT DETECTED Final   Carbapenem resistance VIM NOT DETECTED NOT DETECTED Final    Comment: Performed at [facility], [address], [place], [place] [address]    Studies/Results: No results found.  Assessment/Plan: with complicated urological history and recurrent UTIs now with GNR bacteremia Kleb and Proteus with ESBL +. CT reviewed. Seen by urology. 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 Luster Salters at [facility]   He was previously treated with otpt IV abx last year but was infection free for over 6 months on oral bactrim stopped in Jan due to mild elevated K.     6/11 - clinically improving sensis pending  Recommendations Cont meropenem pending results. Day 3 of effective treatment. Since much improved would favor short duration (5 days) of IV abx for the bacteremia but will need longer course overall for the pyelonephritis (at least 14 days)  Will await to see if any oral options otherwise may need midline and IV carbapenem. If there is sensitivity to quinolone or bactrim could potentially use that for oral course  Will consider restarting bactrim for suppression if sensitive. WIll have fu with ID at [facility] and with primary urology there. I advised him to make fu with his urologist for next week to consider repeat CT scan and further eval.    Thank you very much for the consult. Will follow with you.  Eartha Gold   10/22/2023, 12:33 PM

## 2023-10-23 ENCOUNTER — Encounter: Payer: Self-pay | Admitting: Oncology

## 2023-10-23 ENCOUNTER — Ambulatory Visit: Admitting: Sleep Medicine

## 2023-10-23 ENCOUNTER — Other Ambulatory Visit: Payer: Self-pay

## 2023-10-23 ENCOUNTER — Telehealth: Payer: Self-pay | Admitting: *Deleted

## 2023-10-23 DIAGNOSIS — A4150 Gram-negative sepsis, unspecified: Secondary | ICD-10-CM | POA: Diagnosis not present

## 2023-10-23 DIAGNOSIS — N390 Urinary tract infection, site not specified: Secondary | ICD-10-CM | POA: Diagnosis not present

## 2023-10-23 LAB — MAGNESIUM: Magnesium: 2 mg/dL (ref 1.7–2.4)

## 2023-10-23 LAB — CBC
HCT: 34.8 % — ABNORMAL LOW (ref 39.0–52.0)
Hemoglobin: 12.1 g/dL — ABNORMAL LOW (ref 13.0–17.0)
MCH: 32.4 pg (ref 26.0–34.0)
MCHC: 34.8 g/dL (ref 30.0–36.0)
MCV: 93.3 fL (ref 80.0–100.0)
Platelets: 124 10*3/uL — ABNORMAL LOW (ref 150–400)
RBC: 3.73 MIL/uL — ABNORMAL LOW (ref 4.22–5.81)
RDW: 12.5 % (ref 11.5–15.5)
WBC: 5 10*3/uL (ref 4.0–10.5)
nRBC: 0 % (ref 0.0–0.2)

## 2023-10-23 LAB — CULTURE, BLOOD (ROUTINE X 2)
Culture: NO GROWTH
Culture: NO GROWTH — AB
Special Requests: ADEQUATE

## 2023-10-23 LAB — BASIC METABOLIC PANEL WITH GFR
Anion gap: 9 (ref 5–15)
BUN: 13 mg/dL (ref 8–23)
CO2: 25 mmol/L (ref 22–32)
Calcium: 8.6 mg/dL — ABNORMAL LOW (ref 8.9–10.3)
Chloride: 106 mmol/L (ref 98–111)
Creatinine, Ser: 0.89 mg/dL (ref 0.61–1.24)
GFR, Estimated: >60 mL/min (ref >60)
Glucose, Bld: 98 mg/dL (ref 70–99)
Potassium: 3.7 mmol/L (ref 3.5–5.1)
Sodium: 140 mmol/L (ref 135–145)

## 2023-10-23 LAB — PHOSPHORUS: Phosphorus: 2.8 mg/dL (ref 2.5–4.6)

## 2023-10-23 MED ORDER — CYANOCOBALAMIN 1000 MCG PO TABS
1000.0000 ug | ORAL_TABLET | Freq: Every day | ORAL | 5 refills | Status: DC
  Filled 2023-10-23: qty 30, 30d supply, fill #0

## 2023-10-23 MED ORDER — SODIUM CHLORIDE 0.9 % IV SOLN
1.0000 g | Freq: Once | INTRAVENOUS | Status: AC
  Administered 2023-10-23: 1 g via INTRAVENOUS
  Filled 2023-10-23: qty 1000

## 2023-10-23 MED ORDER — PANTOPRAZOLE SODIUM 40 MG PO TBEC
40.0000 mg | DELAYED_RELEASE_TABLET | Freq: Every day | ORAL | Status: DC
  Administered 2023-10-23: 40 mg via ORAL
  Filled 2023-10-23: qty 1

## 2023-10-23 MED ORDER — CIPROFLOXACIN HCL 500 MG PO TABS: 500.0000 mg | ORAL_TABLET | Freq: Two times a day (BID) | ORAL | Status: DC

## 2023-10-23 MED ORDER — CIPROFLOXACIN HCL 500 MG PO TABS
500.0000 mg | ORAL_TABLET | Freq: Two times a day (BID) | ORAL | 0 refills | Status: AC
  Filled 2023-10-23: qty 28, 14d supply, fill #0

## 2023-10-23 MED ORDER — CIPROFLOXACIN HCL 500 MG PO TABS
500.0000 mg | ORAL_TABLET | Freq: Two times a day (BID) | ORAL | Status: DC
  Administered 2023-10-23: 500 mg via ORAL
  Filled 2023-10-23: qty 1

## 2023-10-23 NOTE — TOC Transition Note (Signed)
 Transition of Care [facility]) - Discharge Note   Patient Details  Name: Donald Barry MRN: [id number] Date of Birth: 03-12-1946  [facility] CM/SW Contact:  Crayton Docker, RN 10/23/2023, 12:02 PM   Clinical Narrative:     Discharge orders noted. Patient discharged to home/self care. Private transportation. No identified needs.   Final next level of care: Home/Self Care Barriers to Discharge: No Barriers Identified   Patient Goals and CMS Choice    Home/self care   Discharge Placement    Home/self care           Discharge Plan and Services Additional resources added to the After Visit Summary for       Social Drivers of Health (SDOH) Interventions SDOH Screenings   Food Insecurity: No Food Insecurity (10/19/2023)  Housing: Low Risk  (10/19/2023)  Transportation Needs: No Transportation Needs (10/19/2023)  Utilities: Not At Risk (10/19/2023)  Financial Resource Strain: Low Risk  (06/25/2023)   Received from [facility]  Social Connections: Socially Integrated (10/19/2023)  Tobacco Use: Low Risk  (10/19/2023)     Readmission Risk Interventions     No data to display

## 2023-10-23 NOTE — Discharge Summary (Signed)
 [facility] Discharge Summary   Patient: Donald Barry ZOX:[id number]  PCP: Nestor Banter, MD  Date of admission: 10/19/2023   Date of discharge:  10/23/2023     Discharge Diagnoses:  Principal Problem:   Sepsis due to gram-negative UTI [facility])   Admitted From: Home Disposition:  Home   Recommendations for Outpatient Follow-up:  PCP: in 1 wk F/u with urology and ID in 1 to 2 weeks Follow up LABS/TEST:     Follow-up Information     Nestor Banter, MD. Go on 11/03/2023.   Specialty: Family Medicine Why: @ 10:30am Contact information: [address] [facility] [place] [address] [phone]                Diet recommendation: Cardiac diet  Activity: The patient is advised to gradually reintroduce usual activities, as tolerated  Discharge Condition: stable  Code Status: Full code   History of present illness: As per the H and P dictated on admission.  Hospital Course:  Donald Barry is a 78 y.o. 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  He follows up with [facility] for his urologic needs.   He presents to the emergency room on account of intractable left-sided abdominal pain, nausea and vomiting.  Onset of symptoms since about 3 PM today.  Denied any aggravating or relieving factors.  Denied any associated fever.  Denies seeing any cloudy urine.   On admission however workup revealed abnormal urinalysis consistent with UTI.  CT abdomen and pelvis ruled reviewed.  No large vessel occlusive disease.  Left kidney demonstrates decreased enhancement compared to the right with significant hydronephrosis and hydroureter which extends to the anastomotic site of the ileal conduit.  No definitive calculus was seen however.  Perinephric stranding was noted.   Patient was admitted with concern for possible pyelonephritis.     Assessment and Plan:   # Sepsis due to acute left pyelonephritis # Complex UTI # History of cystectomy and ileostomy conduit  # Klebsiella and Proteus bacteremia Tachycardia, leukocytosis and UTI UA positive, Ucx growing multiple species, suggested recollection. Blood culture growing Klebsiella ESBL and Proteus S/p cefepime , switched to meropenem ID consulted, recommended ertapenem 1 dose before discharge followed by ciprofloxacin  500 mg p.o. twice daily for 2 weeks.  Cleared for discharge and recommended to follow-up in 1 to 2 weeks and also follow-up with urology as an outpatient.  # Paroxysmal A-fib Metoprolol  25 mg p.o. twice daily and Eliquis  5 mg p.o. twice daily 6/9 BP soft, skipped Lopressor . Donald Barry is a 78 y.o. 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  He follows up with [facility] for his urologic needs.   He presents to the emergency room on account of intractable left-sided abdominal pain, nausea and vomiting.  Onset of symptoms since about 3 PM today.  Denied any aggravating or relieving factors.  Denied any associated fever.  Denies seeing any cloudy urine.   On admission however workup revealed abnormal urinalysis consistent with UTI.  CT abdomen and pelvis ruled reviewed.  No large vessel occlusive disease.   Left kidney demonstrates decreased enhancement compared to the  right with significant hydronephrosis and hydroureter which extends to the anastomotic site of the ileal conduit.  No definitive calculus was seen however.  Perinephric stranding was noted.  Patient was admitted with concern for possible pyelonephritis.     Assessment and Plan:   Sepsis due to acute left pyelonephritis Complex UTI History of cystectomy and ileostomy  conduit  Klebsiella and Proteus bacteremia Tachycardia, leukocytosis and UTI UA positive, Ucx growing multiple species, suggested recollection. Blood culture growing Klebsiella ESBL and Proteus S/p cefepime , switched to meropenem ID consulted, recommended to wait for sensitivity report for oral options otherwise patient may need midline and IV antibiotics.  Will consider restarting Bactrim for suppression of sensitivity Patient was recommended to follow with ID and urologist at [facility] Follow ID if need to repeat blood cultures?     # Paroxysmal A-fib Metoprolol  25 mg p.o. twice daily and Eliquis  5 mg p.o. twice daily 6/9 BP soft, skipped Lopressor .  Resumed home meds on discharge.  Patient was advised to monitor BP at home and follow with PCP. # Hyperlipidemia: Lipitor 10 mg.  Daily # Hypomagnesemia, mag repleted.  Resolved # Vitamin B12 deficiency: b12 level 157 and goal is >400, Started vitamin B12 1000 mcg IM injection daily during hospital stay, followed by oral supplement.  Follow-up PCP to repeat vitamin B12 level after 3 to 6 months. Monitor BP & HR and  titrate medications accordingly # Hyperlipidemia: Lipitor 10 mg.  Daily # Vitamin B12 deficiency: b12 level 157 and goal is >400, Started vitamin B12 1000 mcg IM injection daily during hospital stay, followed by oral supplement.  Follow-up PCP to repeat vitamin B12 level after 3 to 6 months.   Body mass index is 27.12 kg/m.  Nutrition Interventions:  - Patient was instructed, not to drive, operate heavy machinery, perform activities at heights, swimming or participation in water activities or provide baby sitting services while on Pain, Sleep and Anxiety Medications; until his outpatient Physician has advised to do so again.  - Also recommended to not to take more than prescribed Pain, Sleep and Anxiety Medications.  Patient was ambulatory without any assistance. On the day of the discharge the patient's vitals were stable, and no  other acute medical condition were reported by patient. the patient was felt safe to be discharge at Home.  Consultants: Urology and ID Procedures: None  Discharge Exam: General: Appear in no distress, no Rash; Oral Mucosa Clear, moist. Cardiovascular: S1 and S2 Present, no Murmur, Respiratory: normal respiratory effort, Bilateral Air entry present and no Crackles, no wheezes Abdomen: Bowel Sound present, Soft and no tenderness, no hernia Extremities: no Pedal edema, no calf tenderness Neurology: alert and oriented to time, place, and person affect appropriate.  Filed Weights   10/19/23 2256  Weight: 90.7 kg   Vitals:   10/23/23 0515 10/23/23 0801  BP: 139/82 (!) 140/75  Pulse: 66 71  Resp: 18 18  Temp: 98.9 F (37.2 C) 97.8 F (36.6 C)  SpO2: 97% 96%    DISCHARGE MEDICATION: Allergies as of 10/23/2023       Reactions   Tape Dermatitis   Skin blistered- steri strip Skin blistered   Wound Dressing Adhesive Dermatitis   Skin blistered- steri strip   Shellfish Allergy Diarrhea, Nausea And Vomiting   Shrimp   Shellfish-derived Products Diarrhea, Nausea And Vomiting, Nausea Only   Silicone Dermatitis   CPAP mask material breaks him out into a whelps and blisters.   Methenamine Diarrhea, Nausea And Vomiting  Onion Other (See Comments)   diarrhea Diarrhea- verified avoids all onion ingredient (including onion powder). MM, RD 12/19/20   Other Nausea And Vomiting   general anesthesia   Codeine Nausea Only        Medication List     TAKE these medications    acetaminophen  500 MG tablet Commonly known as: TYLENOL  Take 1,000 mg by mouth.   apixaban  5 MG Tabs tablet Commonly known as: Eliquis  Take 1 tablet (5 mg total) by mouth 2 (two) times daily.   atorvastatin  10 MG tablet Commonly known as: LIPITOR Take 1 tablet by mouth daily.   ciprofloxacin  500 MG tablet Commonly known as: CIPRO  Take 1 tablet (500 mg total) by mouth 2 (two) times daily for 14  days. Start taking on: October 24, 2023   CoQ10 100 MG Caps Take 1 capsule by mouth daily.   Cranberry Plus Vitamin C 4200-20-3 MG-MG-UNIT Caps Generic drug: Cranberry-Vitamin C-Vitamin E Take 400 mg by mouth.   cyanocobalamin 1000 MCG tablet Take 1 tablet (1,000 mcg total) by mouth daily. Start taking on: October 28, 2023   D-Mannose 350 MG Caps Herbal Name: D-Mannose 1000mg  daily   diclofenac Sodium 1 % Gel Commonly known as: VOLTAREN Apply 1 g topically.   ferrous sulfate  325 (65 FE) MG EC tablet TAKE 1 TABLET BY MOUTH EVERY DAY   fluticasone  50 MCG/ACT nasal spray Commonly known as: FLONASE  Place 1 spray into both nostrils daily.   guaiFENesin 600 MG 12 hr tablet Commonly known as: MUCINEX Take 600 mg by mouth at bedtime.   loratadine  10 MG tablet Commonly known as: CLARITIN  Take 1 tablet by mouth daily.   meclizine 25 MG tablet Commonly known as: ANTIVERT Take 1 tablet by mouth 3 (three) times daily as needed.   metoprolol  tartrate 25 MG tablet Commonly known as: LOPRESSOR  Take 1 tablet (25 mg total) by mouth 2 (two) times daily.   montelukast 10 MG tablet Commonly known as: SINGULAIR Take 1 tablet by mouth at bedtime.   multivitamin with minerals Tabs tablet Take 1 tablet by mouth daily. Iron  Free   NON FORMULARY Place 1 Dose/kg into the nose daily as needed. Netty pot for sinuses   omeprazole 40 MG capsule Commonly known as: PRILOSEC Take 1 capsule by mouth daily.   Sinus Rinse Pack Use as needed for clearing sinuses of congestion   traZODone  100 MG tablet Commonly known as: DESYREL  Take 50 mg by mouth at bedtime.       Allergies  Allergen Reactions   Tape Dermatitis    Skin blistered- steri strip  Skin blistered   Wound Dressing Adhesive Dermatitis    Skin blistered- steri strip   Shellfish Allergy Diarrhea and Nausea And Vomiting    Shrimp    Shellfish-Derived Products Diarrhea, Nausea And Vomiting and Nausea Only   Silicone  Dermatitis    CPAP mask material breaks him out into a whelps and blisters.   Methenamine Diarrhea and Nausea And Vomiting   Onion Other (See Comments)    diarrhea Diarrhea- verified avoids all onion ingredient (including onion powder). MM, RD 12/19/20   Other Nausea And Vomiting    general anesthesia   Codeine Nausea Only   Discharge Instructions     Call MD for:  difficulty breathing, headache or visual disturbances   Complete by: As directed    Call MD for:  extreme fatigue   Complete by: As directed    Call MD for:  persistant dizziness or  light-headedness   Complete by: As directed    Call MD for:  persistant nausea and vomiting   Complete by: As directed    Call MD for:  severe uncontrolled pain   Complete by: As directed    Call MD for:  temperature >100.4   Complete by: As directed    Diet - low sodium heart healthy   Complete by: As directed    Discharge instructions   Complete by: As directed    F/u with PCP in1 wk F/u with ID and Urologist in 1-2 weeks   Increase activity slowly   Complete by: As directed        The results of significant diagnostics from this hospitalization (including imaging, microbiology, ancillary and laboratory) are listed below for reference.    Significant Diagnostic Studies: CT Angio Abd/Pel W and/or Wo Contrast Result Date: 10/19/2023 CLINICAL DATA:  Abdominal pain and vomiting, initial encounter EXAM: CTA ABDOMEN AND PELVIS WITHOUT AND WITH CONTRAST TECHNIQUE: Multidetector CT imaging of the abdomen and pelvis was performed using the standard protocol during bolus administration of intravenous contrast. Multiplanar reconstructed images and MIPs were obtained and reviewed to evaluate the vascular anatomy. RADIATION DOSE REDUCTION: This exam was performed according to the departmental dose-optimization program which includes automated exposure control, adjustment of the mA and/or kV according to patient size and/or use of iterative  reconstruction technique. CONTRAST:  OMNIPAQUE  IOHEXOL  350 MG/ML SOLN COMPARISON:  04/14/2022 FINDINGS: VASCULAR Aorta: Abdominal aorta demonstrates minimal atherosclerotic calcifications. Celiac: Mild atherosclerotic calcifications the origin is noted without significant stenosis. SMA: Patent without evidence of aneurysm, dissection, vasculitis or significant stenosis. Renals: Both renal arteries are patent without evidence of aneurysm, dissection, vasculitis, fibromuscular dysplasia or significant stenosis. IMA: Patent without evidence of aneurysm, dissection, vasculitis or significant stenosis. Inflow: Iliacs are within normal limits.  No focal stenosis is seen. Veins: No specific venous abnormality is noted. Review of the MIP images confirms the above findings. NON-VASCULAR Lower chest: Mild bibasilar scarring is noted. Hepatobiliary: Dependent gallstone is noted within the gallbladder. No complicating factors are seen. The liver is within normal limits. Pancreas: Unremarkable. No pancreatic ductal dilatation or surrounding inflammatory changes. Spleen: Normal in size without focal abnormality. Adrenals/Urinary Tract: Adrenal glands are within normal limits. The right kidney shows a normal enhancement pattern. No renal calculi or obstructive changes are seen. Changes of prior cystectomy with ileal conduit and urostomy are seen. The left kidney demonstrates decreased enhancement compared to the right with significant hydronephrosis and hydroureter which extends to the anastomotic site of the ileal conduit. No definitive calculus is seen. Enhancement of the ureteral wall is noted as well as significant perinephric stranding. These changes likely represent underlying urinary tract infection. The overall appearance is similar to that seen on the prior CT examination from 2023. Stomach/Bowel: Scattered diverticular change of the colon is noted. No definitive diverticulitis is seen. The appendix is within normal  limits. Small bowel and stomach are within normal limits. Lymphatic: No lymphadenopathy is noted. 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 Other: No abdominal wall hernia or abnormality. No abdominopelvic ascites. Musculoskeletal: No acute or significant osseous findings. IMPRESSION: VASCULAR No vascular abnormality to suggest mesenteric ischemia. NON-VASCULAR Changes in the left kidney similar to that seen on the prior exam with hydronephrosis and hydroureter extending to the anastomotic site to the ileal conduit. Ureteral enhancement and perinephric stranding is noted. These changes are suspicious for underlying urinary tract infection. Likely long-standing narrowing at the anastomotic site is present as well.  Diverticulosis without diverticulitis. Cholelithiasis. Electronically Signed   By: Violeta Grey M.D.   On: 10/19/2023 20:58    Microbiology: Recent Results (from the past 240 hours)  Blood culture (routine x 2)     Status: None (Preliminary result)   Collection Time: 10/19/23  7:26 PM   Specimen: BLOOD  Result Value Ref Range Status   Specimen Description BLOOD BLOOD RIGHT HAND  Final   Special Requests   Final    BOTTLES DRAWN AEROBIC AND ANAEROBIC Blood Culture adequate volume   Culture   Final    NO GROWTH 4 DAYS Performed at [facility], [address], [place], [place] [address]    Report Status PENDING  Incomplete  Urine Culture (for pregnant, neutropenic or urologic patients or patients with an indwelling urinary catheter)     Status: Abnormal   Collection Time: 10/19/23  7:44 PM   Specimen: Urine, Clean Catch  Result Value Ref Range Status   Specimen Description   Final    URINE, CLEAN CATCH Performed at [facility], [address] [place], [place] [address]    Special Requests   Final    NONE Performed at [facility], [address] [place], [place] [address]    Culture MULTIPLE SPECIES PRESENT, SUGGEST RECOLLECTION (A)  Final    Report Status 10/21/2023 FINAL  Final  Blood culture (routine x 2)     Status: Abnormal   Collection Time: 10/19/23  9:29 PM   Specimen: BLOOD LEFT ARM  Result Value Ref Range Status   Specimen Description   Final    BLOOD LEFT ARM Performed at [facility], [address] [place], [place] [address]    Special Requests   Final    BOTTLES DRAWN AEROBIC AND ANAEROBIC Blood Culture results may not be optimal due to an excessive volume of blood received in culture bottles Performed at [facility], [address] [place], [place] [address]    Culture  Setup Time   Final    GRAM NEGATIVE RODS IN BOTH AEROBIC AND ANAEROBIC BOTTLES CRITICAL RESULT CALLED TO, READ BACK BY AND VERIFIED WITH: LISA KLUTTZ PHARMD 1026 10/20/23 HNM    Culture (A)  Final    KLEBSIELLA PNEUMONIAE PROTEUS MIRABILIS Confirmed Extended Spectrum Beta-Lactamase Producer (ESBL).  In bloodstream infections from ESBL organisms, carbapenems are preferred over piperacillin /tazobactam. They are shown to have a lower risk of mortality.    Report Status 10/23/2023 FINAL  Final   Organism ID, Bacteria KLEBSIELLA PNEUMONIAE  Final   Organism ID, Bacteria PROTEUS MIRABILIS  Final   Organism ID, Bacteria PROTEUS MIRABILIS  Final      Susceptibility   Klebsiella pneumoniae - MIC*    AMPICILLIN >=32 RESISTANT Resistant     CEFEPIME  2 SENSITIVE Sensitive     CEFTAZIDIME RESISTANT Resistant     CEFTRIAXONE  >=64 RESISTANT Resistant     CIPROFLOXACIN  <=0.25 SENSITIVE Sensitive     GENTAMICIN <=1 SENSITIVE Sensitive     IMIPENEM <=0.25 SENSITIVE Sensitive     TRIMETH/SULFA >=320 RESISTANT Resistant     AMPICILLIN/SULBACTAM 16 INTERMEDIATE Intermediate     PIP/TAZO <=4 SENSITIVE Sensitive ug/mL    * KLEBSIELLA PNEUMONIAE   Proteus mirabilis - KIRBY BAUER*    CEFAZOLIN  INTERMEDIATE Intermediate    Proteus mirabilis - MIC*    AMPICILLIN <=2 SENSITIVE Sensitive     CEFEPIME  <=0.12 SENSITIVE Sensitive      CEFTAZIDIME <=1 SENSITIVE Sensitive  CEFTRIAXONE  <=0.25 SENSITIVE Sensitive     CIPROFLOXACIN  <=0.25 SENSITIVE Sensitive     GENTAMICIN <=1 SENSITIVE Sensitive     IMIPENEM 2 SENSITIVE Sensitive     TRIMETH/SULFA <=20 SENSITIVE Sensitive     AMPICILLIN/SULBACTAM <=2 SENSITIVE Sensitive     PIP/TAZO <=4 SENSITIVE Sensitive ug/mL    * PROTEUS MIRABILIS    PROTEUS MIRABILIS  Blood Culture ID Panel (Reflexed)     Status: Abnormal   Collection Time: 10/19/23  9:29 PM  Result Value Ref Range Status   Enterococcus faecalis NOT DETECTED NOT DETECTED Final   Enterococcus Faecium NOT DETECTED NOT DETECTED Final   Listeria monocytogenes NOT DETECTED NOT DETECTED Final   Staphylococcus species NOT DETECTED NOT DETECTED Final   Staphylococcus aureus (BCID) NOT DETECTED NOT DETECTED Final   Staphylococcus epidermidis NOT DETECTED NOT DETECTED Final   Staphylococcus lugdunensis NOT DETECTED NOT DETECTED Final   Streptococcus species NOT DETECTED NOT DETECTED Final   Streptococcus agalactiae NOT DETECTED NOT DETECTED Final   Streptococcus pneumoniae NOT DETECTED NOT DETECTED Final   Streptococcus pyogenes NOT DETECTED NOT DETECTED Final   A.calcoaceticus-baumannii NOT DETECTED NOT DETECTED Final   Bacteroides fragilis NOT DETECTED NOT DETECTED Final   Enterobacterales DETECTED (A) NOT DETECTED Final    Comment: CRITICAL RESULT CALLED TO, READ BACK BY AND VERIFIED WITH: LISA KLUTTZ PHARMD 1026 10/20/23 HNM    Enterobacter cloacae complex NOT DETECTED NOT DETECTED Final   Escherichia coli NOT DETECTED NOT DETECTED Final   Klebsiella aerogenes NOT DETECTED NOT DETECTED Final   Klebsiella oxytoca NOT DETECTED NOT DETECTED Final   Klebsiella pneumoniae DETECTED (A) NOT DETECTED Final    Comment: CRITICAL RESULT CALLED TO, READ BACK BY AND VERIFIED WITH: LISA KLUTTZ PHARMD 1026 10/20/23 HNM    Proteus species DETECTED (A) NOT DETECTED Final    Comment: CRITICAL RESULT CALLED TO, READ BACK BY AND  VERIFIED WITH: LISA KLUTTZ PHARMD 1026 10/20/23 HNM    Salmonella species NOT DETECTED NOT DETECTED Final   Serratia marcescens NOT DETECTED NOT DETECTED Final   Haemophilus influenzae NOT DETECTED NOT DETECTED Final   Neisseria meningitidis NOT DETECTED NOT DETECTED Final   Pseudomonas aeruginosa NOT DETECTED NOT DETECTED Final   Stenotrophomonas maltophilia NOT DETECTED NOT DETECTED Final   Candida albicans NOT DETECTED NOT DETECTED Final   Candida auris NOT DETECTED NOT DETECTED Final   Candida glabrata NOT DETECTED NOT DETECTED Final   Candida krusei NOT DETECTED NOT DETECTED Final   Candida parapsilosis NOT DETECTED NOT DETECTED Final   Candida tropicalis NOT DETECTED NOT DETECTED Final   Cryptococcus neoformans/gattii NOT DETECTED NOT DETECTED Final   CTX-M ESBL DETECTED (A) NOT DETECTED Final    Comment: CRITICAL RESULT CALLED TO, READ BACK BY AND VERIFIED WITH: LISA KLUTTZ PHARMD 1026 10/20/23 HNM (NOTE) Extended spectrum beta-lactamase detected. Recommend a carbapenem as initial therapy.      Carbapenem resistance IMP NOT DETECTED NOT DETECTED Final   Carbapenem resistance KPC NOT DETECTED NOT DETECTED Final   Carbapenem resistance NDM NOT DETECTED NOT DETECTED Final   Carbapenem resist OXA 48 LIKE NOT DETECTED NOT DETECTED Final   Carbapenem resistance VIM NOT DETECTED NOT DETECTED Final    Comment: Performed at [facility], [address] [place], [place] [address]     Labs: CBC: Recent Labs  Lab 10/19/23 1927 10/20/23 0218 10/21/23 1106 10/22/23 0555 10/23/23 0439  WBC 12.0* 11.3* 7.7 5.9 5.0  NEUTROABS 10.9*  --   --   --   --  HGB 14.7 13.4 12.7* 12.1* 12.1*  HCT 41.7 38.8* 36.7* 34.7* 34.8*  MCV 92.5 93.7 94.8 93.8 93.3  PLT 152 124* 116* 112* 124*   Basic Metabolic Panel: Recent Labs  Lab 10/19/23 1927 10/20/23 0218 10/21/23 1106 10/22/23 0555 10/23/23 0439  NA 139 138 140 140 140  K 3.9 4.2 3.6 3.8 3.7  CL 106 107 107 107 106  CO2  20* 25 24 27 25   GLUCOSE 114* 133* 129* 101* 98  BUN 18 16 14 12 13   CREATININE 1.03 1.06 0.88 0.85 0.89  CALCIUM  9.0 8.3* 8.4* 8.6* 8.6*  MG  --  1.6* 2.1 2.0 2.0  PHOS  --  3.3 2.6 2.6 2.8   Liver Function Tests: Recent Labs  Lab 10/19/23 1927  AST 30  ALT 17  ALKPHOS 29*  BILITOT 0.8  PROT 7.2  ALBUMIN  4.1   Recent Labs  Lab 10/19/23 1927  LIPASE 66*   No results for input(s): AMMONIA in the last 168 hours. Cardiac Enzymes: No results for input(s): CKTOTAL, CKMB, CKMBINDEX, TROPONINI in the last 168 hours. BNP (last 3 results) No results for input(s): BNP in the last 8760 hours. CBG: No results for input(s): GLUCAP in the last 168 hours.  Time spent: 35 minutes  Signed:  Althia Atlas  [facility]  10/23/2023 11:02 AM

## 2023-10-23 NOTE — Plan of Care (Signed)

## 2023-10-23 NOTE — Telephone Encounter (Signed)
 ER in a few minutes.  He wanted to know if he could come over and get his labs that Dr. Randy Buttery had ordered.  Patient had some infection and he is going to be on antibiotics for 2 weeks.  I called Dr. Randy Buttery and she says to put him out at least 2 weeks since he is on antibiotics but before Dr. Randy Buttery goes on vacation.  I spoke to [place] the scheduler and he is going to be out 3 weeks and she will be seeing him before she goes on her vacation and the patient says that he can see the appointments on his MyChart and Magin said she would put it in

## 2023-10-23 NOTE — Progress Notes (Signed)
 ID brief note Both Kleb and Proetus in blood sensitive to quinolones.  Would give one dose ertapenem today (ordered)  and then dc home on cipro  500 bid for 14 days more He should followup with Urology at [facility] and with ID there to determine  I can see in ID in 10-14 days if he would prefer and lives locally.

## 2023-10-24 ENCOUNTER — Other Ambulatory Visit: Payer: Self-pay

## 2023-10-24 ENCOUNTER — Other Ambulatory Visit (HOSPITAL_COMMUNITY): Payer: Self-pay

## 2023-10-28 ENCOUNTER — Encounter: Payer: Self-pay | Admitting: Sleep Medicine

## 2023-10-28 ENCOUNTER — Ambulatory Visit: Admitting: Sleep Medicine

## 2023-10-28 VITALS — BP 110/70 | HR 56 | Temp 97.1°F | Ht 72.0 in | Wt 194.2 lb

## 2023-10-28 DIAGNOSIS — E785 Hyperlipidemia, unspecified: Secondary | ICD-10-CM

## 2023-10-28 DIAGNOSIS — G4733 Obstructive sleep apnea (adult) (pediatric): Secondary | ICD-10-CM

## 2023-10-28 NOTE — Progress Notes (Signed)
 Name:Donald Barry MRN: [id number] DOB: [birth year]   CHIEF COMPLAINT:  PAP F/U   HISTORY OF PRESENT ILLNESS:  Donald Barry is a 78 y.o. w/ a h/o OSA, hyperlipidemia, GERD and atrial fibrillation who presents for PAP therapy. BIPAP pressure was adjusted during last visit and mask was changed to the Airfit F40 FFM. States that he has adjusted well to new settings and mask. Reports feeling significantly more refreshed upon awakening with PAP therapy.    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  has a past surgical history that includes Back surgery (2008); Stomach surgery; Colonoscopy; Esophagogastroduodenoscopy; Cystoscopy with biopsy (N/A, 07/17/2018); Cystoscopy with fulgeration (N/A, 07/17/2018); Hernia repair (2012); Cystoscopy with biopsy (N/A, 06/17/2019); cysto with hydrodistension (N/A, 06/17/2019); Cystoscopy with fulgeration (N/A, 06/17/2019); and cystectomy with prostatectomy ileal conduit. Prior to Admission medications   Medication Sig Start Date End Date Taking? Authorizing Provider  acetaminophen  (TYLENOL ) 500 MG tablet Take 1,000 mg by mouth. 12/23/20  Yes [provider]  apixaban  (ELIQUIS ) 5 MG TABS tablet Take 1 tablet (5 mg total) by mouth 2 (two) times daily. 05/15/23  Yes Hammock, Sheri, NP  atorvastatin  (LIPITOR) 10 MG tablet Take 1 tablet by mouth daily.  08/06/19  Yes [provider]  ciprofloxacin  (CIPRO ) 500 MG tablet Take 1 tablet (500 mg total) by mouth 2 (two) times daily for 14 days. 10/24/23 11/07/23 Yes Althia Atlas, MD  Coenzyme Q10 (COQ10) 100 MG CAPS Take 1 capsule by mouth daily.   Yes [provider]  Cranberry-Vitamin C-Vitamin E (CRANBERRY PLUS VITAMIN C) 4200-20-3 MG-MG-UNIT CAPS Take 400 mg by mouth.   Yes [provider]  cyanocobalamin  1000 MCG tablet Take 1 tablet (1,000 mcg total) by mouth daily. 10/28/23 04/25/24 Yes Althia Atlas, MD  D-Mannose 350 MG CAPS Herbal Name: D-Mannose 1000mg  daily   Yes [provider]  diclofenac Sodium (VOLTAREN) 1 % GEL Apply 1 g topically.   Yes [provider]  ferrous sulfate  325 (65 FE) MG EC tablet TAKE 1 TABLET BY MOUTH EVERY DAY 05/31/22  Yes Rao, Archana C, MD  fluticasone  (FLONASE ) 50 MCG/ACT nasal spray Place 1 spray into both nostrils daily.   Yes [provider]  guaiFENesin  (MUCINEX ) 600 MG 12 hr tablet Take 600 mg by mouth at bedtime.   Yes [provider]  Hypertonic Nasal Wash (SINUS RINSE) PACK Use as needed for clearing sinuses of congestion   Yes [provider]  loratadine  (CLARITIN ) 10 MG tablet Take 1 tablet by mouth daily.   Yes [provider]  metoprolol  tartrate (LOPRESSOR ) 25 MG tablet Take 1 tablet (25 mg total) by mouth 2 (two) times daily. 07/03/23  Yes Wenona Hamilton, MD  montelukast (SINGULAIR) 10 MG tablet Take 1 tablet by mouth at bedtime. 07/15/23 07/14/24 Yes [provider]  Multiple Vitamin (MULTIVITAMIN WITH MINERALS) TABS tablet Take 1 tablet by mouth daily. Iron  Free   Yes [provider]  NON FORMULARY Place  1 Dose/kg into the nose daily as needed. Netty pot for sinuses   Yes [provider]  omeprazole (PRILOSEC) 40 MG capsule Take 1 capsule by mouth daily. 10/25/19  Yes [provider]  traZODone  (DESYREL ) 100 MG tablet Take 50 mg by mouth at  bedtime. 09/27/22  Yes [provider]   Allergies  Allergen Reactions   Tape Dermatitis    Skin blistered- steri strip  Skin blistered   Wound Dressing Adhesive Dermatitis    Skin blistered- steri strip   Shellfish Allergy Diarrhea and Nausea And Vomiting    Shrimp    Shellfish-Derived Products Diarrhea, Nausea And Vomiting and Nausea Only   Silicone Dermatitis    CPAP mask material breaks him out into a whelps and blisters.   Methenamine Diarrhea and Nausea And Vomiting   Onion Other (See Comments)    diarrhea Diarrhea- verified avoids all onion ingredient (including onion powder). MM, RD 12/19/20   Other Nausea And Vomiting    general anesthesia   Codeine Nausea Only    FAMILY HISTORY:  family history includes GER disease in his brother and sister; Heart disease in his father; Hypertension in his mother. SOCIAL HISTORY:  reports that he has never smoked. He has never used smokeless tobacco. He reports that he does not drink alcohol and does not use drugs.   Review of Systems:  Gen:  Denies  fever, sweats, chills weight loss  HEENT: Denies blurred vision, double vision, ear pain, eye pain, hearing loss, nose bleeds, sore throat Cardiac:  No dizziness, chest pain or heaviness, chest tightness,edema, No JVD Resp:   No cough, -sputum production, -shortness of breath,-wheezing, -hemoptysis,  Gi: Denies swallowing difficulty, stomach pain, nausea or vomiting, diarrhea, constipation, bowel incontinence Gu:  Denies bladder incontinence, burning urine Ext:   Denies Joint pain, stiffness or swelling Skin: Denies  skin rash, easy bruising or bleeding or hives Endoc:  Denies polyuria, polydipsia , polyphagia or weight change Psych:   Denies depression, insomnia or hallucinations  Other:  All other systems negative  VITAL SIGNS: BP 110/70 (BP Location: Right Arm, Cuff Size: Normal)   Pulse (!) 56   Temp (!) 97.1 F (36.2 C)   Ht 6' (1.829 m)   Wt 194 lb 3.2 oz (88.1 kg)    SpO2 98%   BMI 26.34 kg/m    Physical Examination:   General Appearance: No distress  EYES PERRLA, EOM intact.   NECK Supple, No JVD Pulmonary: normal breath sounds, No wheezing.  CardiovascularNormal S1,S2.  No m/r/g.   Abdomen: Benign, Soft, non-tender. Skin:   warm, no rashes, no ecchymosis  Extremities: normal, no cyanosis, clubbing. Neuro:without focal findings,  speech normal  PSYCHIATRIC: Mood, affect within normal limits.   ASSESSMENT AND PLAN  OSA Patient is using and benefiting from PAP therapy. Order for mask sent to DME. Discussed the consequences of untreated sleep apnea. Advised not to drive drowsy for safety of patient and others. Will follow up in 1 year.     Hyperlipidemia Stable, on current management. Following with PCP.    Patient  satisfied with Plan of action and management. All questions answered  I spent a total of 33 minutes reviewing chart data, face-to-face evaluation with the patient, counseling and coordination of care as detailed above.    Maddilyn Campus, M.D.  Sleep Medicine [facility]

## 2023-10-28 NOTE — Patient Instructions (Signed)

## 2023-10-29 ENCOUNTER — Other Ambulatory Visit: Payer: Medicare HMO

## 2023-11-13 ENCOUNTER — Inpatient Hospital Stay (HOSPITAL_BASED_OUTPATIENT_CLINIC_OR_DEPARTMENT_OTHER): Admitting: Oncology

## 2023-11-13 ENCOUNTER — Encounter: Payer: Self-pay | Admitting: Oncology

## 2023-11-13 ENCOUNTER — Inpatient Hospital Stay: Attending: Oncology

## 2023-11-13 VITALS — BP 104/71 | HR 55 | Temp 98.7°F | Resp 20 | Wt 193.5 lb

## 2023-11-13 DIAGNOSIS — D509 Iron deficiency anemia, unspecified: Secondary | ICD-10-CM | POA: Diagnosis present

## 2023-11-13 DIAGNOSIS — D472 Monoclonal gammopathy: Secondary | ICD-10-CM

## 2023-11-13 DIAGNOSIS — D508 Other iron deficiency anemias: Secondary | ICD-10-CM

## 2023-11-13 LAB — CBC
HCT: 42 % (ref 39.0–52.0)
Hemoglobin: 14.2 g/dL (ref 13.0–17.0)
MCH: 31.7 pg (ref 26.0–34.0)
MCHC: 33.8 g/dL (ref 30.0–36.0)
MCV: 93.8 fL (ref 80.0–100.0)
Platelets: 134 10*3/uL — ABNORMAL LOW (ref 150–400)
RBC: 4.48 MIL/uL (ref 4.22–5.81)
RDW: 12.4 % (ref 11.5–15.5)
WBC: 6.2 10*3/uL (ref 4.0–10.5)
nRBC: 0 % (ref 0.0–0.2)

## 2023-11-13 LAB — IRON AND TIBC
Iron: 103 ug/dL (ref 45–182)
Saturation Ratios: 35 % (ref 17.9–39.5)
TIBC: 298 ug/dL (ref 250–450)
UIBC: 195 ug/dL

## 2023-11-13 LAB — FERRITIN: Ferritin: 69 ng/mL (ref 24–336)

## 2023-11-13 NOTE — Progress Notes (Signed)
 Hematology/Oncology Consult note [facility]  [phone] [phone]  Patient Care Team: Diedra Lame, MD as PCP - General (Family Medicine) Darron Deatrice LABOR, MD as PCP - Cardiology (Cardiology) Melanee Annah BROCKS, MD as Consulting Physician (Hematology and Oncology)   Name of the patient: Donald Barry  [id number]  Sep 24, 1945   Date of visit: 11/13/23  Diagnosis- iron  deficiency anemia IgM MGUS  Chief complaint/ Reason for visit- routine f/u of anemia and MGUS  Heme/Onc history:  patient is a 78 year old male with a past medical history of fatty liver and GERD who was seen by pulmonology clinic GI for symptoms of abdominal pain.  As a part of that work-up patient had SPEP done Which showed mildly elevated IgM levels of 202.  IgM monoclonal kappa light chain was noted on immunofixation 0.2 g.  Hence the patient has been referred to us .  Of note patient had a normal CBC with a white count of 4.1, H&H of 14.8/43.2 in February 2020.  His platelet counts have always been mildly low in the 120s.  CMP was unremarkable.  Iron  studies were normal.  Patient also had CT of the abdomen done as a part of microscopic hematuria in February 2020 which did not reveal any evidence of splenomegaly   Results of blood work from 09/24/2018 were as follows: CBC showed white count of 4.7, H&H of 14.7/43.1.  Count of 147.  CMP was within normal limits.  Multiple myeloma panel showed M protein IgM kappa 0.3 g.  Serum free light chains revealed a mildly elevated kappa free light chain 21.6 with a kappa lambda light chain ratio of 1.86.  Interval history-he feels well presently. He recently had an episode of pyelonephritis and was admitted to the hospital for the same.  ECOG PS- 0 Pain scale- 0  Review of systems- Review of Systems  Constitutional:  Negative for chills, fever, malaise/fatigue and weight loss.  HENT:  Negative for congestion, ear discharge and  nosebleeds.   Eyes:  Negative for blurred vision.  Respiratory:  Negative for cough, hemoptysis, sputum production, shortness of breath and wheezing.   Cardiovascular:  Negative for chest pain, palpitations, orthopnea and claudication.  Gastrointestinal:  Negative for abdominal pain, blood in stool, constipation, diarrhea, heartburn, melena, nausea and vomiting.  Genitourinary:  Negative for dysuria, flank pain, frequency, hematuria and urgency.  Musculoskeletal:  Negative for back pain, joint pain and myalgias.  Skin:  Negative for rash.  Neurological:  Negative for dizziness, tingling, focal weakness, seizures, weakness and headaches.  Endo/Heme/Allergies:  Does not bruise/bleed easily.  Psychiatric/Behavioral:  Negative for depression and suicidal ideas. The patient does not have insomnia.       Allergies  Allergen Reactions   Tape Dermatitis    Skin blistered- steri strip  Skin blistered   Wound Dressing Adhesive Dermatitis    Skin blistered- steri strip   Shellfish Allergy Diarrhea and Nausea And Vomiting    Shrimp    Shellfish-Derived Products Diarrhea, Nausea And Vomiting and Nausea Only   Silicone Dermatitis    CPAP mask material breaks him out into a whelps and blisters.   Methenamine Diarrhea and Nausea And Vomiting   Onion Other (See Comments)    diarrhea Diarrhea- verified avoids all onion ingredient (including onion powder). MM, RD 12/19/20   Other Nausea And Vomiting    general anesthesia   Codeine Nausea Only     Past Medical History:  Diagnosis Date   A-fib (HCC)  Actinic keratosis    Hx of PDT Tx., face 1/252021 and scalp 07/05/2019   Allergic rhinitis, seasonal 06/14/2014   Allergy    Arthritis    Chronic neck pain 06/14/2014   Complication of anesthesia    Gastroesophageal reflux disease without esophagitis 06/14/2014   Headache    h/o migraines   Hepatic steatosis 04/05/2018   Hiatal hernia 09/09/2017   Intermittent left lower quadrant abdominal  pain 07/15/2017   PONV (postoperative nausea and vomiting)    Sleep apnea    cpap   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 SCCis   Squamous cell skin cancer 07/2017   Resected from scalp.     Past Surgical History:  Procedure Laterality Date   BACK SURGERY  2008   L4, L5   COLONOSCOPY     cystectomy with prostatectomy ileal conduit     CYSTO WITH HYDRODISTENSION N/A 06/17/2019   Procedure: CYSTOSCOPY/HYDRODISTENSION;  Surgeon: Francisca Redell BROCKS, MD;  Location: [facility];  Service: Urology;  Laterality: N/A;   CYSTOSCOPY WITH BIOPSY N/A 07/17/2018   Procedure: CYSTOSCOPY WITH BLADDER BIOPSY;  Surgeon: Francisca Redell BROCKS, MD;  Location: [facility];  Service: Urology;  Laterality: N/A;   CYSTOSCOPY WITH BIOPSY N/A 06/17/2019   Procedure: CYSTOSCOPY WITH BLADDER  BIOPSY;  Surgeon: Francisca Redell BROCKS, MD;  Location: [facility];  Service: Urology;  Laterality: N/A;   CYSTOSCOPY WITH FULGERATION N/A 07/17/2018   Procedure: CYSTOSCOPY WITH FULGERATION;  Surgeon: Francisca Redell BROCKS, MD;  Location: [facility];  Service: Urology;  Laterality: N/A;   CYSTOSCOPY WITH FULGERATION N/A 06/17/2019   Procedure: CYSTOSCOPY WITH FULGERATION;  Surgeon: Francisca Redell BROCKS, MD;  Location: [facility];  Service: Urology;  Laterality: N/A;   ESOPHAGOGASTRODUODENOSCOPY     HERNIA REPAIR  2012   umbilical   STOMACH SURGERY     FUNDIC GLAND POLYP    Social History   Socioeconomic History   Marital status: Married    Spouse name: Not on file   Number of children: Not on file   Years of education: Not on file   Highest education level: Not on file  Occupational History   Not on file  Tobacco Use   Smoking status: Never   Smokeless tobacco: Never  Vaping Use   Vaping status: Never Used  Substance and Sexual Activity   Alcohol use: Never   Drug use: Never   Sexual activity: Yes    Birth control/protection: None   Other Topics Concern   Not on file  Social History Narrative   Not on file   Social Drivers of Health   Financial Resource Strain: Low Risk  (06/25/2023)   Received from [facility]   Overall Financial Resource Strain (CARDIA)    Difficulty of Paying Living Expenses: Not very hard  Food Insecurity: No Food Insecurity (11/13/2023)   Hunger Vital Sign    Worried About Running Out of Food in the Last Year: Never true    Ran Out of Food in the Last Year: Never true  Transportation Needs: No Transportation Needs (11/13/2023)   PRAPARE - Administrator, Civil Service (Medical): No    Lack of Transportation (Non-Medical): No  Physical Activity: Not on file  Stress: Not on file  Social Connections: Socially Integrated (10/19/2023)   Social Connection and Isolation Panel    Frequency of Communication with Friends  and Family: More than three times a week    Frequency of Social Gatherings with Friends and Family: Twice a week    Attends Religious Services: More than 4 times per year    Active Member of [employer] or Organizations: Yes    Attends Engineer, structural: More than 4 times per year    Marital Status: Married  Catering manager Violence: Not At Risk (11/13/2023)   Humiliation, Afraid, Rape, and Kick questionnaire    Fear of Current or Ex-Partner: No    Emotionally Abused: No    Physically Abused: No    Sexually Abused: No    Family History  Problem Relation Age of Onset   Hypertension Mother    Heart disease Father    GER disease Sister    GER disease Brother    Prostate cancer Neg Hx    Kidney cancer Neg Hx      Current Outpatient Medications:    acetaminophen  (TYLENOL ) 500 MG tablet, Take 1,000 mg by mouth., Disp: , Rfl:    apixaban  (ELIQUIS ) 5 MG TABS tablet, Take 1 tablet (5 mg total) by mouth 2 (two) times daily., Disp: 180 tablet, Rfl: 3   atorvastatin  (LIPITOR) 10 MG tablet, Take 1 tablet by mouth daily., Disp: , Rfl:    Coenzyme Q10  (COQ10) 100 MG CAPS, Take 1 capsule by mouth daily., Disp: , Rfl:    Cranberry-Vitamin C-Vitamin E (CRANBERRY PLUS VITAMIN C) 4200-20-3 MG-MG-UNIT CAPS, Take 400 mg by mouth., Disp: , Rfl:    cyanocobalamin  1000 MCG tablet, Take 1 tablet (1,000 mcg total) by mouth daily., Disp: 30 tablet, Rfl: 5   D-Mannose 350 MG CAPS, Herbal Name: D-Mannose 1000mg  daily, Disp: , Rfl:    diclofenac Sodium (VOLTAREN) 1 % GEL, Apply 1 g topically., Disp: , Rfl:    ferrous sulfate  325 (65 FE) MG EC tablet, TAKE 1 TABLET BY MOUTH EVERY DAY, Disp: 90 tablet, Rfl: 1   fluticasone  (FLONASE ) 50 MCG/ACT nasal spray, Place 1 spray into both nostrils daily., Disp: , Rfl:    guaiFENesin  (MUCINEX ) 600 MG 12 hr tablet, Take 600 mg by mouth at bedtime., Disp: , Rfl:    Hypertonic Nasal Wash (SINUS RINSE) PACK, Use as needed for clearing sinuses of congestion, Disp: , Rfl:    loratadine  (CLARITIN ) 10 MG tablet, Take 1 tablet by mouth daily., Disp: , Rfl:    metoprolol  tartrate (LOPRESSOR ) 25 MG tablet, Take 1 tablet (25 mg total) by mouth 2 (two) times daily., Disp: 90 tablet, Rfl: 1   montelukast (SINGULAIR) 10 MG tablet, Take 1 tablet by mouth at bedtime., Disp: , Rfl:    Multiple Vitamin (MULTIVITAMIN WITH MINERALS) TABS tablet, Take 1 tablet by mouth daily. Iron  Free, Disp: , Rfl:    NON FORMULARY, Place 1 Dose/kg into the nose daily as needed. Netty pot for sinuses, Disp: , Rfl:    omeprazole (PRILOSEC) 40 MG capsule, Take 1 capsule by mouth daily., Disp: , Rfl:    Probiotic Product (PROBIOTIC BLEND PO), Take by mouth., Disp: , Rfl:    traZODone  (DESYREL ) 100 MG tablet, Take 50 mg by mouth at bedtime., Disp: , Rfl:   Physical exam:  Vitals:   11/13/23 1044  BP: 104/71  Pulse: (!) 55  Resp: 20  Temp: 98.7 F (37.1 C)  SpO2: 100%  Weight: 193 lb 8 oz (87.8 kg)   Physical Exam Cardiovascular:     Rate and Rhythm: Normal rate and regular rhythm.  Heart sounds: Normal heart sounds.  Pulmonary:     Effort:  Pulmonary effort is normal.     Breath sounds: Normal breath sounds.  Skin:    General: Skin is warm and dry.  Neurological:     Mental Status: He is alert and oriented to person, place, and time.      I have personally reviewed labs listed below:    Latest Ref Rng & Units 10/23/2023    4:39 AM  CMP  Glucose 70 - 99 mg/dL 98   BUN 8 - 23 mg/dL 13   Creatinine 9.38 - 1.24 mg/dL 9.10   Sodium 864 - 854 mmol/L 140   Potassium 3.5 - 5.1 mmol/L 3.7   Chloride 98 - 111 mmol/L 106   CO2 22 - 32 mmol/L 25   Calcium  8.9 - 10.3 mg/dL 8.6       Latest Ref Rng & Units 11/13/2023   10:35 AM  CBC  WBC 4.0 - 10.5 K/uL 6.2   Hemoglobin 13.0 - 17.0 g/dL 85.7   Hematocrit 60.9 - 52.0 % 42.0   Platelets 150 - 400 K/uL 134    I have personally reviewed Radiology images listed below: No images are attached to the encounter.  CT Angio Abd/Pel W and/or Wo Contrast Result Date: 10/19/2023 CLINICAL DATA:  Abdominal pain and vomiting, initial encounter EXAM: CTA ABDOMEN AND PELVIS WITHOUT AND WITH CONTRAST TECHNIQUE: Multidetector CT imaging of the abdomen and pelvis was performed using the standard protocol during bolus administration of intravenous contrast. Multiplanar reconstructed images and MIPs were obtained and reviewed to evaluate the vascular anatomy. RADIATION DOSE REDUCTION: This exam was performed according to the departmental dose-optimization program which includes automated exposure control, adjustment of the mA and/or kV according to patient size and/or use of iterative reconstruction technique. CONTRAST:  OMNIPAQUE  IOHEXOL  350 MG/ML SOLN COMPARISON:  04/14/2022 FINDINGS: VASCULAR Aorta: Abdominal aorta demonstrates minimal atherosclerotic calcifications. Celiac: Mild atherosclerotic calcifications the origin is noted without significant stenosis. SMA: Patent without evidence of aneurysm, dissection, vasculitis or significant stenosis. Renals: Both renal arteries are patent without  evidence of aneurysm, dissection, vasculitis, fibromuscular dysplasia or significant stenosis. IMA: Patent without evidence of aneurysm, dissection, vasculitis or significant stenosis. Inflow: Iliacs are within normal limits.  No focal stenosis is seen. Veins: No specific venous abnormality is noted. Review of the MIP images confirms the above findings. NON-VASCULAR Lower chest: Mild bibasilar scarring is noted. Hepatobiliary: Dependent gallstone is noted within the gallbladder. No complicating factors are seen. The liver is within normal limits. Pancreas: Unremarkable. No pancreatic ductal dilatation or surrounding inflammatory changes. Spleen: Normal in size without focal abnormality. Adrenals/Urinary Tract: Adrenal glands are within normal limits. The right kidney shows a normal enhancement pattern. No renal calculi or obstructive changes are seen. Changes of prior cystectomy with ileal conduit and urostomy are seen. The left kidney demonstrates decreased enhancement compared to the right with significant hydronephrosis and hydroureter which extends to the anastomotic site of the ileal conduit. No definitive calculus is seen. Enhancement of the ureteral wall is noted as well as significant perinephric stranding. These changes likely represent underlying urinary tract infection. The overall appearance is similar to that seen on the prior CT examination from 2023. Stomach/Bowel: Scattered diverticular change of the colon is noted. No definitive diverticulitis is seen. The appendix is within normal limits. Small bowel and stomach are within normal limits. Lymphatic: No lymphadenopathy is noted. 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 Other: No abdominal wall hernia or abnormality.  No abdominopelvic ascites. Musculoskeletal: No acute or significant osseous findings. IMPRESSION: VASCULAR No vascular abnormality to suggest mesenteric ischemia. NON-VASCULAR Changes in the left kidney similar to that seen on the  prior exam with hydronephrosis and hydroureter extending to the anastomotic site to the ileal conduit. Ureteral enhancement and perinephric stranding is noted. These changes are suspicious for underlying urinary tract infection. Likely long-standing narrowing at the anastomotic site is present as well. Diverticulosis without diverticulitis. Cholelithiasis. Electronically Signed   By: Oneil Devonshire M.D.   On: 10/19/2023 20:58     Assessment and plan- Patient is a 78 y.o. male who is here for routine f/u of following issues:  Iron  deficiency anemia: Patient is not presently anemic with a hb of 14.2. iron  studies are normal. He does not need iv iron  at this time.  CBC ferritin and iron  studies in 6 months in 1 year and I will see him back in 1 year  2.  IgM MGUS: Myeloma panel from last year continues to show stable. M protein IgM kappa which fluctuates around 0.3 g.  No evidence of crab criteria.  Levels from today are pending.  We will continue to monitor them yearly   Visit Diagnosis 1. Iron  deficiency anemia, unspecified iron  deficiency anemia type   2. MGUS (monoclonal gammopathy of unknown significance)      Dr. Annah Skene, MD, MPH [facility] [phone] 11/13/2023 12:53 PM

## 2023-11-13 NOTE — Progress Notes (Signed)
 Patient has no concerns

## 2023-11-17 LAB — MULTIPLE MYELOMA PANEL, SERUM
Albumin SerPl Elph-Mcnc: 3.8 g/dL (ref 2.9–4.4)
Albumin/Glob SerPl: 1.5 (ref 0.7–1.7)
Alpha 1: 0.2 g/dL (ref 0.0–0.4)
Alpha2 Glob SerPl Elph-Mcnc: 0.8 g/dL (ref 0.4–1.0)
B-Globulin SerPl Elph-Mcnc: 0.8 g/dL (ref 0.7–1.3)
Gamma Glob SerPl Elph-Mcnc: 0.9 g/dL (ref 0.4–1.8)
Globulin, Total: 2.7 g/dL (ref 2.2–3.9)
IgA: 118 mg/dL (ref 61–437)
IgG (Immunoglobin G), Serum: 777 mg/dL (ref 603–1613)
IgM (Immunoglobulin M), Srm: 307 mg/dL — ABNORMAL HIGH (ref 15–143)
M Protein SerPl Elph-Mcnc: 0.3 g/dL — ABNORMAL HIGH
Total Protein ELP: 6.5 g/dL (ref 6.0–8.5)

## 2023-11-17 LAB — KAPPA/LAMBDA LIGHT CHAINS
Kappa free light chain: 32.7 mg/L — ABNORMAL HIGH (ref 3.3–19.4)
Kappa, lambda light chain ratio: 2.14 — ABNORMAL HIGH (ref 0.26–1.65)
Lambda free light chains: 15.3 mg/L (ref 5.7–26.3)

## 2023-11-22 ENCOUNTER — Other Ambulatory Visit: Payer: Self-pay | Admitting: Cardiovascular Disease

## 2023-11-24 ENCOUNTER — Other Ambulatory Visit (HOSPITAL_COMMUNITY): Payer: Self-pay

## 2023-11-24 ENCOUNTER — Other Ambulatory Visit: Payer: Self-pay

## 2023-12-01 ENCOUNTER — Telehealth: Payer: Self-pay

## 2023-12-01 DIAGNOSIS — G4733 Obstructive sleep apnea (adult) (pediatric): Secondary | ICD-10-CM

## 2023-12-01 NOTE — Telephone Encounter (Signed)
 Did you want order for supplies sent out? Not sure what he was given. Thanks!

## 2023-12-01 NOTE — Telephone Encounter (Signed)
 Copied from CRM [id number]. Topic: Clinical - Order For Equipment >> Nov 28, 2023  4:32 PM Rilla B wrote: Patient was in on 6/17 and Dr Jess gave patient a mask and head gear to wear. [facility] state they have not received any update on it.  Patient would like update.  Please leave message in MyChart for patient or voicemail.

## 2023-12-03 NOTE — Telephone Encounter (Signed)
 Lm x1 for the patient.

## 2023-12-16 NOTE — Telephone Encounter (Signed)
 Error

## 2023-12-29 ENCOUNTER — Ambulatory Visit: Payer: Medicare HMO | Admitting: Dermatology

## 2023-12-29 ENCOUNTER — Encounter: Payer: Self-pay | Admitting: Dermatology

## 2023-12-29 DIAGNOSIS — Z1283 Encounter for screening for malignant neoplasm of skin: Secondary | ICD-10-CM

## 2023-12-29 DIAGNOSIS — W908XXA Exposure to other nonionizing radiation, initial encounter: Secondary | ICD-10-CM

## 2023-12-29 DIAGNOSIS — L821 Other seborrheic keratosis: Secondary | ICD-10-CM | POA: Diagnosis not present

## 2023-12-29 DIAGNOSIS — L603 Nail dystrophy: Secondary | ICD-10-CM

## 2023-12-29 DIAGNOSIS — L570 Actinic keratosis: Secondary | ICD-10-CM

## 2023-12-29 DIAGNOSIS — L578 Other skin changes due to chronic exposure to nonionizing radiation: Secondary | ICD-10-CM | POA: Diagnosis not present

## 2023-12-29 DIAGNOSIS — D1801 Hemangioma of skin and subcutaneous tissue: Secondary | ICD-10-CM

## 2023-12-29 DIAGNOSIS — Z719 Counseling, unspecified: Secondary | ICD-10-CM

## 2023-12-29 DIAGNOSIS — L814 Other melanin hyperpigmentation: Secondary | ICD-10-CM | POA: Diagnosis not present

## 2023-12-29 DIAGNOSIS — D229 Melanocytic nevi, unspecified: Secondary | ICD-10-CM

## 2023-12-29 MED ORDER — FLUOROURACIL 5 % EX CREA
TOPICAL_CREAM | CUTANEOUS | 1 refills | Status: DC
Start: 2023-12-29 — End: 2024-01-24

## 2023-12-29 NOTE — Patient Instructions (Addendum)
 For hands/fingers apply Neutrogena [place] Hand Cream, Cerave cream, Cetaphil cream or Excipial Hand Cream.    Instructions for [facility] Medications  One or more of your medications was sent to the [facility] mail order compounding pharmacy. You will receive an email from them and can purchase the medicine through that link. It will then be mailed to your home at the address you confirmed. If for any reason you do not receive an email from them, please check your spam folder. If you still do not find the email, please let us  know. [facility] phone number is [phone].    5-Fluorouracil /Calcipotriene  Patient Education   Actinic keratoses are the dry, red scaly spots on the skin caused by sun damage. A portion of these spots can turn into skin cancer with time, and treating them can help prevent development of skin cancer.   Treatment of these spots requires removal of the defective skin cells. There are various ways to remove actinic keratoses, including freezing with liquid nitrogen, treatment with creams, or treatment with a blue light procedure in the office.   5-fluorouracil  cream is a topical cream used to treat actinic keratoses. It works by interfering with the growth of abnormal fast-growing skin cells, such as actinic keratoses. These cells peel off and are replaced by healthy ones.   5-fluorouracil /calcipotriene  is a combination of the 5-fluorouracil  cream with a vitamin D  analog cream called calcipotriene . The calcipotriene  alone does not treat actinic keratoses. However, when it is combined with 5-fluorouracil , it helps the 5-fluorouracil  treat the actinic keratoses much faster so that the same results can be achieved with a much shorter treatment time.  INSTRUCTIONS FOR 5-FLUOROURACIL /CALCIPOTRIENE  CREAM:   5-fluorouracil /calcipotriene  cream typically only needs to be used for 4-7 days. A thin layer should be applied twice a day to the treatment areas  recommended by your physician.   If your physician prescribed you separate tubes of 5-fluourouracil and calcipotriene , apply a thin layer of 5-fluorouracil  followed by a thin layer of calcipotriene .   Avoid contact with your eyes, nostrils, and mouth. Do not use 5-fluorouracil /calcipotriene  cream on infected or open wounds.   You will develop redness, irritation and some crusting at areas where you have pre-cancer damage/actinic keratoses. IF YOU DEVELOP PAIN, BLEEDING, OR SIGNIFICANT CRUSTING, STOP THE TREATMENT EARLY - you have already gotten a good response and the actinic keratoses should clear up well.  Wash your hands after applying 5-fluorouracil  5% cream on your skin.   A moisturizer or sunscreen with a minimum SPF 30 should be applied each morning.   Once you have finished the treatment, you can apply a thin layer of Vaseline twice a day to irritated areas to soothe and calm the areas more quickly. If you experience significant discomfort, contact your physician.  For some patients it is necessary to repeat the treatment for best results.  SIDE EFFECTS: When using 5-fluorouracil /calcipotriene  cream, you may have mild irritation, such as redness, dryness, swelling, or a mild burning sensation. This usually resolves within 2 weeks. The more actinic keratoses you have, the more redness and inflammation you can expect during treatment. Eye irritation has been reported rarely. If this occurs, please let us  know.  If you have any trouble using this cream, please call the office. If you have any other questions about this information, please do not hesitate to ask me before you leave the office.       Due to recent changes in healthcare laws, you may see results of  your pathology and/or laboratory studies on MyChart before the doctors have had a chance to review them. We understand that in some cases there may be results that are confusing or concerning to you. Please understand that not  all results are received at the same time and often the doctors may need to interpret multiple results in order to provide you with the best plan of care or course of treatment. Therefore, we ask that you please give us  2 business days to thoroughly review all your results before contacting the office for clarification. Should we see a critical lab result, you will be contacted sooner.   If You Need Anything After Your Visit  If you have any questions or concerns for your doctor, please call our main line at [phone] and press option 4 to reach your doctor's medical assistant. If no one answers, please leave a voicemail as directed and we will return your call as soon as possible. Messages left after 4 pm will be answered the following business day.   You may also send us  a message via MyChart. We typically respond to MyChart messages within 1-2 business days.  For prescription refills, please ask your pharmacy to contact our office. Our fax number is [phone].  If you have an urgent issue when the clinic is closed that cannot wait until the next business day, you can page your doctor at the number below.    Please note that while we do our best to be available for urgent issues outside of office hours, we are not available 24/7.   If you have an urgent issue and are unable to reach us , you may choose to seek medical care at your doctor's office, retail clinic, urgent care center, or emergency room.  If you have a medical emergency, please immediately call 911 or go to the emergency department.  Pager Numbers  - Dr. Hester: [phone]  - Dr. Jackquline: [phone]  - Dr. Claudene: [phone]   In the event of inclement weather, please call our main line at [phone] for an update on the status of any delays or closures.  Dermatology Medication Tips: Please keep the boxes that topical medications come in in order to help keep track of the instructions about where and how to  use these. Pharmacies typically print the medication instructions only on the boxes and not directly on the medication tubes.   If your medication is too expensive, please contact our office at [phone] option 4 or send us  a message through MyChart.   We are unable to tell what your co-pay for medications will be in advance as this is different depending on your insurance coverage. However, we may be able to find a substitute medication at lower cost or fill out paperwork to get insurance to cover a needed medication.   If a prior authorization is required to get your medication covered by your insurance company, please allow us  1-2 business days to complete this process.  Drug prices often vary depending on where the prescription is filled and some pharmacies may offer cheaper prices.  The website www.goodrx.com contains coupons for medications through different pharmacies. The prices here do not account for what the cost may be with help from insurance (it may be cheaper with your insurance), but the website can give you the price if you did not use any insurance.  - You can print the associated coupon and take it with your prescription to the pharmacy.  - You may also  stop by our office during regular business hours and pick up a GoodRx coupon card.  - If you need your prescription sent electronically to a different pharmacy, notify our office through [facility] or by phone at [phone] option 4.     Si Usted Necesita Algo Despus de Su Visita  Tambin puede enviarnos un mensaje a travs de Clinical cytogeneticist. Por lo general respondemos a los mensajes de MyChart en el transcurso de 1 a 2 das hbiles.  Para renovar recetas, por favor pida a su farmacia que se ponga en contacto con nuestra oficina. Randi lakes de fax es [place] [phone].  Si tiene un asunto urgente cuando la clnica est cerrada y que no puede esperar hasta el siguiente da hbil, puede llamar/localizar a su  doctor(a) al nmero que aparece a continuacin.   Por favor, tenga en cuenta que aunque hacemos todo lo posible para estar disponibles para asuntos urgentes fuera del horario de [place], no estamos disponibles las 24 horas del da, los 7 [address]  [address] de la [place].   Si tiene un problema urgente y no puede comunicarse con nosotros, puede optar por buscar atencin mdica  en el consultorio de su doctor(a), en una clnica privada, en un centro de atencin urgente o en una sala de emergencias.  Si tiene Engineer, drilling, por favor llame inmediatamente al 911 o vaya a la sala de emergencias.  Nmeros de bper  - Dr. Hester: [phone]  - Dra. Jackquline: [phone]  - Dr. Claudene: [phone]   En caso de inclemencias del tiempo, por favor llame a landry capes principal al [phone] para una actualizacin sobre el [place] de cualquier retraso o cierre.  Consejos para la medicacin en dermatologa: Por favor, guarde las cajas en las que vienen los medicamentos de uso tpico para ayudarle a seguir las instrucciones sobre dnde y cmo usarlos. Las farmacias generalmente imprimen las instrucciones del medicamento slo en las cajas y no directamente en los tubos del [place].   Si su medicamento es muy caro, por favor, pngase en contacto con landry rieger llamando al [phone] y presione la opcin 4 o envenos un mensaje a travs de Clinical cytogeneticist.   No podemos decirle cul ser su copago por los medicamentos por adelantado ya que esto es diferente dependiendo de la cobertura de su seguro. Sin embargo, es posible que podamos encontrar un medicamento sustituto a Audiological scientist un formulario para que el seguro cubra el medicamento que se considera necesario.   Si se requiere una autorizacin previa para que su compaa de seguros malta su medicamento, por favor permtanos de 1 a 2 das hbiles para completar este proceso.  Los precios de los medicamentos varan con frecuencia dependiendo del  Environmental consultant de dnde se surte la receta y alguna farmacias pueden ofrecer precios ms baratos.  El sitio web www.goodrx.com tiene cupones para medicamentos de Health and safety inspector. Los precios aqu no tienen en cuenta lo que podra costar con la ayuda del seguro (puede ser ms barato con su seguro), pero el sitio web puede darle el precio si no utiliz Tourist information centre manager.  - Puede imprimir el cupn correspondiente y llevarlo con su receta a la farmacia.  - Tambin puede pasar por nuestra oficina durante el horario de atencin regular y Education officer, museum una tarjeta de cupones de GoodRx.  - Si necesita que su receta se enve electrnicamente a Psychiatrist, informe a nuestra oficina a travs de MyChart de [place] o por telfono llamando al [phone] y presione la  opcin 4.

## 2023-12-29 NOTE — Progress Notes (Signed)
 Follow-Up Visit   Subjective  Donald Barry is a 78 y.o. male who presents for the following: Skin Cancer Screening and Full Body Skin Exam. Hx of AK. Hx of SCCs. Patient would like his fingernails examined as they are splitting. Patient also c/o fever blisters on his lips; not currently flared.   The patient presents for Total-Body Skin Exam (TBSE) for skin cancer screening and mole check. The patient has spots, moles and lesions to be evaluated, some may be new or changing and the patient may have concern these could be cancer.    The following portions of the chart were reviewed this encounter and updated as appropriate: medications, allergies, medical history  Review of Systems:  No other skin or systemic complaints except as noted in HPI or Assessment and Plan.  Objective  Well appearing patient in no apparent distress; mood and affect are within normal limits.  A full examination was performed including scalp, head, eyes, ears, nose, lips, neck, chest, axillae, abdomen, back, buttocks, bilateral upper extremities, bilateral lower extremities, hands, feet, fingers, toes, fingernails, and toenails. All findings within normal limits unless otherwise noted below.   Relevant physical exam findings are noted in the Assessment and Plan.    Assessment & Plan   SKIN CANCER SCREENING PERFORMED TODAY.  ACTINIC DAMAGE WITH PRECANCEROUS ACTINIC KERATOSES Counseling for Topical Chemotherapy Management: Patient exhibits: - Severe, confluent actinic changes with pre-cancerous actinic keratoses that is secondary to cumulative UV radiation exposure over time - Condition that is severe; chronic, not at goal. - diffuse scaly erythematous macules and papules with underlying dyspigmentation - Discussed Prescription Field Treatment topical Chemotherapy for Severe, Chronic Confluent Actinic Changes with Pre-Cancerous Actinic Keratoses Field treatment involves treatment of an entire area of  skin that has confluent Actinic Changes (Sun/ Ultraviolet light damage) and PreCancerous Actinic Keratoses by method of PhotoDynamic Therapy (PDT) and/or prescription Topical Chemotherapy agents such as 5-fluorouracil , 5-fluorouracil /calcipotriene , and/or imiquimod.  The purpose is to decrease the number of clinically evident and subclinical PreCancerous lesions to prevent progression to development of skin cancer by chemically destroying early precancer changes that may or may not be visible.  It has been shown to reduce the risk of developing skin cancer in the treated area. As a result of treatment, redness, scaling, crusting, and open sores may occur during treatment course. One or more than one of these methods may be used and may have to be used several times to control, suppress and eliminate the PreCancerous changes. Discussed treatment course, expected reaction, and possible side effects. - Recommend daily broad spectrum sunscreen SPF 30+ to sun-exposed areas, reapply every 2 hours as needed.  - Staying in the shade or wearing long sleeves, sun glasses (UVA+UVB protection) and wide brim hats (4-inch brim around the entire circumference of the hat) are also recommended. - Call for new or changing lesions.  Start 5-fluorouracil /calcipotriene  BID until reaction develops; up to 10 days for the scalp and up to 7 days for all other locations. Vertex scalp, lateral cheeks, bottom lip - offered follow up for AKs. Patient prefers to wait until next skin check  LENTIGINES, SEBORRHEIC KERATOSES, HEMANGIOMAS - Benign normal skin lesions - Benign-appearing - Call for any changes  MELANOCYTIC NEVI - Tan-brown and/or pink-flesh-colored symmetric macules and papules - Benign appearing on exam today - Observation - Call clinic for new or changing moles - Recommend daily use of broad spectrum spf 30+ sunscreen to sun-exposed areas.   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 - No evidence of recurrence today - No lymphadenopathy - Recommend regular full body skin exams - Recommend daily broad spectrum sunscreen SPF 30+ to sun-exposed areas, reapply every 2 hours as needed.  - Call if any new or changing lesions are noted between office visits  SEBORRHEIC KERATOSIS - Waxy, tan plaque at left lateral lower leg - Benign-appearing - Discussed benign etiology and prognosis. 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  I, Emerick Ege, CMA am acting as scribe for Boneta Sharps, MD.   Documentation: I have reviewed the above documentation for accuracy and completeness, and I agree with the above.  Boneta Sharps, MD

## 2024-01-21 ENCOUNTER — Inpatient Hospital Stay
Admission: EM | Admit: 2024-01-21 | Discharge: 2024-01-24 | DRG: 698 | Disposition: A | Discharge status: Home / Self Care | Attending: Internal Medicine | Admitting: Internal Medicine

## 2024-01-21 ENCOUNTER — Other Ambulatory Visit: Payer: Self-pay

## 2024-01-21 DIAGNOSIS — G4733 Obstructive sleep apnea (adult) (pediatric): Secondary | ICD-10-CM | POA: Diagnosis present

## 2024-01-21 DIAGNOSIS — N390 Urinary tract infection, site not specified: Secondary | ICD-10-CM | POA: Diagnosis present

## 2024-01-21 DIAGNOSIS — I959 Hypotension, unspecified: Secondary | ICD-10-CM | POA: Diagnosis present

## 2024-01-21 DIAGNOSIS — Z91048 Other nonmedicinal substance allergy status: Secondary | ICD-10-CM

## 2024-01-21 DIAGNOSIS — I480 Paroxysmal atrial fibrillation: Secondary | ICD-10-CM | POA: Diagnosis present

## 2024-01-21 DIAGNOSIS — T83598A Infection and inflammatory reaction due to other prosthetic device, implant and graft in urinary system, initial encounter: Principal | ICD-10-CM | POA: Diagnosis present

## 2024-01-21 DIAGNOSIS — R112 Nausea with vomiting, unspecified: Secondary | ICD-10-CM

## 2024-01-21 DIAGNOSIS — D696 Thrombocytopenia, unspecified: Secondary | ICD-10-CM | POA: Diagnosis present

## 2024-01-21 DIAGNOSIS — Z888 Allergy status to other drugs, medicaments and biological substances status: Secondary | ICD-10-CM

## 2024-01-21 DIAGNOSIS — R1030 Lower abdominal pain, unspecified: Principal | ICD-10-CM

## 2024-01-21 DIAGNOSIS — Z906 Acquired absence of other parts of urinary tract: Secondary | ICD-10-CM

## 2024-01-21 DIAGNOSIS — Z87440 Personal history of urinary (tract) infections: Secondary | ICD-10-CM

## 2024-01-21 DIAGNOSIS — Z936 Other artificial openings of urinary tract status: Secondary | ICD-10-CM

## 2024-01-21 DIAGNOSIS — Z91013 Allergy to seafood: Secondary | ICD-10-CM

## 2024-01-21 DIAGNOSIS — Z85828 Personal history of other malignant neoplasm of skin: Secondary | ICD-10-CM

## 2024-01-21 DIAGNOSIS — Z8249 Family history of ischemic heart disease and other diseases of the circulatory system: Secondary | ICD-10-CM

## 2024-01-21 DIAGNOSIS — E860 Dehydration: Secondary | ICD-10-CM | POA: Diagnosis present

## 2024-01-21 DIAGNOSIS — N12 Tubulo-interstitial nephritis, not specified as acute or chronic: Secondary | ICD-10-CM | POA: Diagnosis not present

## 2024-01-21 DIAGNOSIS — N179 Acute kidney failure, unspecified: Secondary | ICD-10-CM | POA: Diagnosis present

## 2024-01-21 DIAGNOSIS — B9620 Unspecified Escherichia coli [E. coli] as the cause of diseases classified elsewhere: Secondary | ICD-10-CM | POA: Diagnosis present

## 2024-01-21 DIAGNOSIS — R54 Age-related physical debility: Secondary | ICD-10-CM | POA: Diagnosis present

## 2024-01-21 DIAGNOSIS — Z7901 Long term (current) use of anticoagulants: Secondary | ICD-10-CM

## 2024-01-21 DIAGNOSIS — N10 Acute tubulo-interstitial nephritis: Secondary | ICD-10-CM | POA: Diagnosis present

## 2024-01-21 DIAGNOSIS — A419 Sepsis, unspecified organism: Principal | ICD-10-CM | POA: Diagnosis present

## 2024-01-21 DIAGNOSIS — Z79899 Other long term (current) drug therapy: Secondary | ICD-10-CM

## 2024-01-21 LAB — CBC WITH DIFFERENTIAL/PLATELET
Abs Immature Granulocytes: 0.04 K/uL (ref 0.00–0.07)
Basophils Absolute: 0 K/uL (ref 0.0–0.1)
Basophils Relative: 0 %
Eosinophils Absolute: 0 K/uL (ref 0.0–0.5)
Eosinophils Relative: 0 %
HCT: 41.5 % (ref 39.0–52.0)
Hemoglobin: 14.1 g/dL (ref 13.0–17.0)
Immature Granulocytes: 0 %
Lymphocytes Relative: 6 %
Lymphs Abs: 0.7 K/uL (ref 0.7–4.0)
MCH: 31.8 pg (ref 26.0–34.0)
MCHC: 34 g/dL (ref 30.0–36.0)
MCV: 93.7 fL (ref 80.0–100.0)
Monocytes Absolute: 0.5 K/uL (ref 0.1–1.0)
Monocytes Relative: 4 %
Neutro Abs: 9.5 K/uL — ABNORMAL HIGH (ref 1.7–7.7)
Neutrophils Relative %: 90 %
Platelets: 123 K/uL — ABNORMAL LOW (ref 150–400)
RBC: 4.43 MIL/uL (ref 4.22–5.81)
RDW: 13.2 % (ref 11.5–15.5)
WBC: 10.7 K/uL — ABNORMAL HIGH (ref 4.0–10.5)
nRBC: 0 % (ref 0.0–0.2)

## 2024-01-21 LAB — URINALYSIS, ROUTINE W REFLEX MICROSCOPIC
Bacteria, UA: NONE SEEN
Bilirubin Urine: NEGATIVE
Glucose, UA: NEGATIVE mg/dL
Ketones, ur: NEGATIVE mg/dL
Nitrite: NEGATIVE
Protein, ur: 100 mg/dL — AB
Specific Gravity, Urine: 1.009 (ref 1.005–1.030)
Squamous Epithelial / HPF: 0 /HPF (ref 0–5)
pH: 6 (ref 5.0–8.0)

## 2024-01-21 LAB — BASIC METABOLIC PANEL WITH GFR
Anion gap: 10 (ref 5–15)
BUN: 19 mg/dL (ref 8–23)
CO2: 23 mmol/L (ref 22–32)
Calcium: 8.8 mg/dL — ABNORMAL LOW (ref 8.9–10.3)
Chloride: 107 mmol/L (ref 98–111)
Creatinine, Ser: 1.57 mg/dL — ABNORMAL HIGH (ref 0.61–1.24)
GFR, Estimated: 45 mL/min — ABNORMAL LOW (ref >60)
Glucose, Bld: 132 mg/dL — ABNORMAL HIGH (ref 70–99)
Potassium: 4.2 mmol/L (ref 3.5–5.1)
Sodium: 140 mmol/L (ref 135–145)

## 2024-01-21 LAB — LACTIC ACID, PLASMA: Lactic Acid, Venous: 1.9 mmol/L (ref 0.5–1.9)

## 2024-01-21 NOTE — ED Triage Notes (Signed)
 Pt reports lower abd pain that began earlier tonight, pt has an Ileal Conduit that was placed in 2021. Pt reports when he has had similar symptoms in the past he has had a UTI and been septic from it.

## 2024-01-22 ENCOUNTER — Emergency Department

## 2024-01-22 DIAGNOSIS — Z906 Acquired absence of other parts of urinary tract: Secondary | ICD-10-CM | POA: Diagnosis not present

## 2024-01-22 DIAGNOSIS — B9620 Unspecified Escherichia coli [E. coli] as the cause of diseases classified elsewhere: Secondary | ICD-10-CM | POA: Diagnosis present

## 2024-01-22 DIAGNOSIS — N390 Urinary tract infection, site not specified: Secondary | ICD-10-CM | POA: Diagnosis present

## 2024-01-22 DIAGNOSIS — N179 Acute kidney failure, unspecified: Secondary | ICD-10-CM

## 2024-01-22 DIAGNOSIS — Z7901 Long term (current) use of anticoagulants: Secondary | ICD-10-CM | POA: Diagnosis not present

## 2024-01-22 DIAGNOSIS — R54 Age-related physical debility: Secondary | ICD-10-CM | POA: Diagnosis present

## 2024-01-22 DIAGNOSIS — Z8249 Family history of ischemic heart disease and other diseases of the circulatory system: Secondary | ICD-10-CM | POA: Diagnosis not present

## 2024-01-22 DIAGNOSIS — G4733 Obstructive sleep apnea (adult) (pediatric): Secondary | ICD-10-CM | POA: Diagnosis present

## 2024-01-22 DIAGNOSIS — I959 Hypotension, unspecified: Secondary | ICD-10-CM | POA: Diagnosis present

## 2024-01-22 DIAGNOSIS — Z79899 Other long term (current) drug therapy: Secondary | ICD-10-CM | POA: Diagnosis not present

## 2024-01-22 DIAGNOSIS — Z91013 Allergy to seafood: Secondary | ICD-10-CM | POA: Diagnosis not present

## 2024-01-22 DIAGNOSIS — N10 Acute tubulo-interstitial nephritis: Secondary | ICD-10-CM | POA: Diagnosis present

## 2024-01-22 DIAGNOSIS — E860 Dehydration: Secondary | ICD-10-CM | POA: Diagnosis present

## 2024-01-22 DIAGNOSIS — Z87440 Personal history of urinary (tract) infections: Secondary | ICD-10-CM | POA: Diagnosis not present

## 2024-01-22 DIAGNOSIS — I480 Paroxysmal atrial fibrillation: Secondary | ICD-10-CM | POA: Diagnosis present

## 2024-01-22 DIAGNOSIS — N12 Tubulo-interstitial nephritis, not specified as acute or chronic: Secondary | ICD-10-CM | POA: Diagnosis present

## 2024-01-22 DIAGNOSIS — T83598A Infection and inflammatory reaction due to other prosthetic device, implant and graft in urinary system, initial encounter: Secondary | ICD-10-CM | POA: Diagnosis present

## 2024-01-22 DIAGNOSIS — Z91048 Other nonmedicinal substance allergy status: Secondary | ICD-10-CM | POA: Diagnosis not present

## 2024-01-22 DIAGNOSIS — A419 Sepsis, unspecified organism: Secondary | ICD-10-CM | POA: Diagnosis present

## 2024-01-22 DIAGNOSIS — D696 Thrombocytopenia, unspecified: Secondary | ICD-10-CM | POA: Diagnosis present

## 2024-01-22 DIAGNOSIS — Z888 Allergy status to other drugs, medicaments and biological substances status: Secondary | ICD-10-CM | POA: Diagnosis not present

## 2024-01-22 DIAGNOSIS — Z936 Other artificial openings of urinary tract status: Secondary | ICD-10-CM | POA: Diagnosis not present

## 2024-01-22 DIAGNOSIS — Z85828 Personal history of other malignant neoplasm of skin: Secondary | ICD-10-CM | POA: Diagnosis not present

## 2024-01-22 LAB — CBC
HCT: 40.1 % (ref 39.0–52.0)
Hemoglobin: 13.5 g/dL (ref 13.0–17.0)
MCH: 31.7 pg (ref 26.0–34.0)
MCHC: 33.7 g/dL (ref 30.0–36.0)
MCV: 94.1 fL (ref 80.0–100.0)
Platelets: 116 K/uL — ABNORMAL LOW (ref 150–400)
RBC: 4.26 MIL/uL (ref 4.22–5.81)
RDW: 13.3 % (ref 11.5–15.5)
WBC: 12.4 K/uL — ABNORMAL HIGH (ref 4.0–10.5)
nRBC: 0 % (ref 0.0–0.2)

## 2024-01-22 LAB — LIPASE, BLOOD: Lipase: 52 U/L — ABNORMAL HIGH (ref 11–51)

## 2024-01-22 LAB — URINALYSIS, W/ REFLEX TO CULTURE (INFECTION SUSPECTED)
Bacteria, UA: NONE SEEN
Bilirubin Urine: NEGATIVE
Glucose, UA: NEGATIVE mg/dL
Ketones, ur: NEGATIVE mg/dL
Nitrite: NEGATIVE
Protein, ur: 100 mg/dL — AB
Specific Gravity, Urine: 1.013 (ref 1.005–1.030)
Squamous Epithelial / HPF: 0 /HPF (ref 0–5)
pH: 6 (ref 5.0–8.0)

## 2024-01-22 LAB — HEPATIC FUNCTION PANEL
ALT: 16 U/L (ref 0–44)
AST: 25 U/L (ref 15–41)
Albumin: 3.7 g/dL (ref 3.5–5.0)
Alkaline Phosphatase: 29 U/L — ABNORMAL LOW (ref 38–126)
Bilirubin, Direct: <0.1 mg/dL (ref 0.0–0.2)
Total Bilirubin: 0.5 mg/dL (ref 0.0–1.2)
Total Protein: 6.4 g/dL — ABNORMAL LOW (ref 6.5–8.1)

## 2024-01-22 LAB — BASIC METABOLIC PANEL WITH GFR
Anion gap: 7 (ref 5–15)
BUN: 19 mg/dL (ref 8–23)
CO2: 24 mmol/L (ref 22–32)
Calcium: 8.3 mg/dL — ABNORMAL LOW (ref 8.9–10.3)
Chloride: 109 mmol/L (ref 98–111)
Creatinine, Ser: 1.33 mg/dL — ABNORMAL HIGH (ref 0.61–1.24)
GFR, Estimated: 55 mL/min — ABNORMAL LOW (ref >60)
Glucose, Bld: 143 mg/dL — ABNORMAL HIGH (ref 70–99)
Potassium: 4.3 mmol/L (ref 3.5–5.1)
Sodium: 140 mmol/L (ref 135–145)

## 2024-01-22 MED ORDER — ACETAMINOPHEN 325 MG PO TABS
650.0000 mg | ORAL_TABLET | Freq: Four times a day (QID) | ORAL | Status: DC | PRN
  Administered 2024-01-22 – 2024-01-24 (×5): 650 mg via ORAL
  Filled 2024-01-22 (×5): qty 2

## 2024-01-22 MED ORDER — TRAZODONE HCL 50 MG PO TABS
50.0000 mg | ORAL_TABLET | Freq: Every day | ORAL | Status: DC
  Administered 2024-01-22 – 2024-01-23 (×2): 50 mg via ORAL
  Filled 2024-01-22 (×2): qty 1

## 2024-01-22 MED ORDER — ONDANSETRON HCL 4 MG/2ML IJ SOLN
4.0000 mg | Freq: Once | INTRAMUSCULAR | Status: AC
  Administered 2024-01-22: 4 mg via INTRAVENOUS
  Filled 2024-01-22: qty 2

## 2024-01-22 MED ORDER — IOHEXOL 300 MG/ML  SOLN
80.0000 mL | Freq: Once | INTRAMUSCULAR | Status: AC | PRN
  Administered 2024-01-22: 80 mL via INTRAVENOUS

## 2024-01-22 MED ORDER — METOPROLOL TARTRATE 25 MG PO TABS
25.0000 mg | ORAL_TABLET | Freq: Two times a day (BID) | ORAL | Status: DC
  Administered 2024-01-22 – 2024-01-24 (×6): 25 mg via ORAL
  Filled 2024-01-22 (×6): qty 1

## 2024-01-22 MED ORDER — HYDROCODONE-ACETAMINOPHEN 5-325 MG PO TABS
1.0000 | ORAL_TABLET | ORAL | Status: DC | PRN
  Administered 2024-01-22: 2 via ORAL
  Administered 2024-01-23: 1 via ORAL
  Filled 2024-01-22 (×2): qty 2

## 2024-01-22 MED ORDER — SODIUM CHLORIDE 0.9 % IV BOLUS
1000.0000 mL | Freq: Once | INTRAVENOUS | Status: AC
  Administered 2024-01-22: 1000 mL via INTRAVENOUS

## 2024-01-22 MED ORDER — MIDODRINE HCL 5 MG PO TABS
5.0000 mg | ORAL_TABLET | Freq: Three times a day (TID) | ORAL | Status: DC
  Administered 2024-01-22 – 2024-01-23 (×2): 5 mg via ORAL
  Filled 2024-01-22 (×2): qty 1

## 2024-01-22 MED ORDER — APIXABAN 5 MG PO TABS
5.0000 mg | ORAL_TABLET | Freq: Two times a day (BID) | ORAL | Status: DC
  Administered 2024-01-22 – 2024-01-24 (×5): 5 mg via ORAL
  Filled 2024-01-22 (×5): qty 1

## 2024-01-22 MED ORDER — SODIUM CHLORIDE 0.9 % IV SOLN
2.0000 g | Freq: Two times a day (BID) | INTRAVENOUS | Status: DC
  Administered 2024-01-22 – 2024-01-24 (×5): 2 g via INTRAVENOUS
  Filled 2024-01-22 (×6): qty 12.5

## 2024-01-22 MED ORDER — APIXABAN 5 MG PO TABS: 5.0000 mg | ORAL_TABLET | Freq: Two times a day (BID) | ORAL | Status: DC

## 2024-01-22 MED ORDER — ONDANSETRON HCL 4 MG/2ML IJ SOLN
4.0000 mg | Freq: Four times a day (QID) | INTRAMUSCULAR | Status: DC | PRN
  Administered 2024-01-22: 4 mg via INTRAVENOUS
  Filled 2024-01-22: qty 2

## 2024-01-22 MED ORDER — ATORVASTATIN CALCIUM 10 MG PO TABS
10.0000 mg | ORAL_TABLET | Freq: Every day | ORAL | Status: DC
  Administered 2024-01-22 – 2024-01-24 (×3): 10 mg via ORAL
  Filled 2024-01-22 (×3): qty 1

## 2024-01-22 MED ORDER — ACETAMINOPHEN 650 MG RE SUPP: 650.0000 mg | Freq: Four times a day (QID) | RECTAL | Status: DC | PRN

## 2024-01-22 MED ORDER — ONDANSETRON HCL 4 MG PO TABS: 4.0000 mg | ORAL_TABLET | Freq: Four times a day (QID) | ORAL | Status: DC | PRN

## 2024-01-22 MED ORDER — SODIUM CHLORIDE 0.9 % IV SOLN
2.0000 g | Freq: Once | INTRAVENOUS | Status: AC
  Administered 2024-01-22: 2 g via INTRAVENOUS
  Filled 2024-01-22: qty 12.5

## 2024-01-22 MED ORDER — MORPHINE SULFATE (PF) 4 MG/ML IV SOLN
4.0000 mg | Freq: Once | INTRAVENOUS | Status: AC
  Administered 2024-01-22: 4 mg via INTRAVENOUS
  Filled 2024-01-22: qty 1

## 2024-01-22 MED ORDER — LACTATED RINGERS IV BOLUS
500.0000 mL | Freq: Once | INTRAVENOUS | Status: AC
  Administered 2024-01-22: 500 mL via INTRAVENOUS

## 2024-01-22 MED ORDER — LACTATED RINGERS IV SOLN
150.0000 mL/h | INTRAVENOUS | Status: AC
  Administered 2024-01-22: 150 mL/h via INTRAVENOUS

## 2024-01-22 NOTE — Assessment & Plan Note (Signed)
 Chronic anticoagulation Mild RVR to 105 likely triggered by UTI Continue apixaban  and metoprolol 

## 2024-01-22 NOTE — Progress Notes (Signed)
 Patient was seen and examined in the emergency department.  Interval events noted.  He complains of left flank pain and not feeling good.  He is febrile, tachycardic and tachypneic.  Physical exam significant for left flank tenderness.  Continue IV cefepime  and IV fluids for sepsis secondary to acute left pyelonephritis.  Analgesics as needed for pain.  Follow-up urine and blood cultures.

## 2024-01-22 NOTE — Progress Notes (Signed)
 Pharmacy Antibiotic Note  Donald Barry is a 78 y.o. male admitted on 01/21/2024 with UTI.  Pharmacy has been consulted for Cefepime  dosing.  Plan: Cefepime  2 gm IV X 1 given in ED on 9/11 @ 0225. Cefepime  2 gm IV Q12H ordered to start on 9/11 @ 1400.   Height: 6' (182.9 cm) Weight: 88 kg (194 lb) IBW/kg (Calculated) : 77.6  Temp (24hrs), Avg:98.4 F (36.9 C), Min:98.4 F (36.9 C), Max:98.4 F (36.9 C)  Recent Labs  Lab 01/21/24 2331  WBC 10.7*  CREATININE 1.57*  LATICACIDVEN 1.9    Estimated Creatinine Clearance: 43.2 mL/min (A) (by C-G formula based on SCr of 1.57 mg/dL (H)).    Allergies  Allergen Reactions   Tape Dermatitis    Skin blistered- steri strip  Skin blistered   Wound Dressing Adhesive Dermatitis    Skin blistered- steri strip   Shellfish Allergy Diarrhea and Nausea And Vomiting    Shrimp    Shellfish-Derived Products Diarrhea, Nausea And Vomiting and Nausea Only   Silicone Dermatitis    CPAP mask material breaks him out into a whelps and blisters.   Methenamine Diarrhea and Nausea And Vomiting   Onion Other (See Comments)    diarrhea Diarrhea- verified avoids all onion ingredient (including onion powder). MM, RD 12/19/20   Other Nausea And Vomiting    general anesthesia   Codeine Nausea Only    Antimicrobials this admission:   >>    >>   Dose adjustments this admission:   Microbiology results:  BCx:   UCx:    Sputum:    MRSA PCR:   Thank you for allowing pharmacy to be a part of this patient's care.  Leiani Enright D 01/22/2024 3:08 AM

## 2024-01-22 NOTE — Assessment & Plan Note (Signed)
 CPAP nightly

## 2024-01-22 NOTE — Assessment & Plan Note (Signed)
 Creatinine 1.57 up from baseline of 0.85 Received an IV fluid bolus in the ED Continue to monitor

## 2024-01-22 NOTE — ED Provider Notes (Signed)
 [facility] Provider Note    Event Date/Time   First MD Initiated Contact with Patient 01/22/24 0007     (approximate)   History   Abdominal Pain   HPI  RUSTYN CONERY is a 78 y.o. male   Past medical history of cystectomy with ureteral ileal conduit in 2022, recurrent urinary tract infections, atrial fibrillation on Eliquis , here with lower abdominal pain, subjective fevers, fatigue, that makes him concerned as the symptoms match his urinary tract infections that led to sepsis in the past.  He also had acute onset lower abdominal pain today at around 830 while at rest.  Bowel movements have been normal, passing flatus.  Has been nauseated and vomited 1 time.  He does have output from his stoma site, but the urine does look cloudy.  Independent Historian contributed to assessment above: Wife gives collateral information as above  External Medical Documents Reviewed: Discharge summary of hospitalization in June 2025 for a left-sided pyelonephritis and sepsis      Physical Exam   Triage Vital Signs: ED Triage Vitals [01/21/24 2329]  Encounter Vitals Group     BP 121/72     Girls Systolic BP Percentile      Girls Diastolic BP Percentile      Boys Systolic BP Percentile      Boys Diastolic BP Percentile      Pulse Rate 98     Resp 18     Temp 98.4 F (36.9 C)     Temp Source Oral     SpO2 97 %     Weight 194 lb (88 kg)     Height 6' (1.829 m)     Head Circumference      Peak Flow      Pain Score 9     Pain Loc      Pain Education      Exclude from Growth Chart     Most recent vital signs: Vitals:   01/21/24 2329  BP: 121/72  Pulse: 98  Resp: 18  Temp: 98.4 F (36.9 C)  SpO2: 97%    General: Awake, no distress.  CV:  Good peripheral perfusion.  Resp:  Normal effort.  Other:  Well-appearing man sitting in stretcher with ileal conduit bag in place with yellow urine in the bag.  Abdomen appears mildly distended with some mild  tenderness to palpation in the lower quadrants, but nonperitoneal.  No skin rashes.  Vital signs within normal limits, no fever.   ED Results / Procedures / Treatments   Labs (all labs ordered are listed, but only abnormal results are displayed) Labs Reviewed  CBC WITH DIFFERENTIAL/PLATELET - Abnormal; Notable for the following components:      Result Value   WBC 10.7 (*)    Platelets 123 (*)    Neutro Abs 9.5 (*)    All other components within normal limits  BASIC METABOLIC PANEL WITH GFR - Abnormal; Notable for the following components:   Glucose, Bld 132 (*)    Creatinine, Ser 1.57 (*)    Calcium  8.8 (*)    GFR, Estimated 45 (*)    All other components within normal limits  URINALYSIS, ROUTINE W REFLEX MICROSCOPIC - Abnormal; Notable for the following components:   Color, Urine YELLOW (*)    APPearance CLOUDY (*)    Hgb urine dipstick SMALL (*)    Protein, ur 100 (*)    Leukocytes,Ua LARGE (*)    All other components within  normal limits  HEPATIC FUNCTION PANEL - Abnormal; Notable for the following components:   Total Protein 6.4 (*)    Alkaline Phosphatase 29 (*)    All other components within normal limits  LIPASE, BLOOD - Abnormal; Notable for the following components:   Lipase 52 (*)    All other components within normal limits  URINALYSIS, W/ REFLEX TO CULTURE (INFECTION SUSPECTED) - Abnormal; Notable for the following components:   Color, Urine YELLOW (*)    APPearance TURBID (*)    Hgb urine dipstick SMALL (*)    Protein, ur 100 (*)    Leukocytes,Ua LARGE (*)    All other components within normal limits  URINE CULTURE  LACTIC ACID, PLASMA     I ordered and reviewed the above labs they are notable for white blood cell count is mildly elevated 10.7, and his urinalysis shows leukocytes but no white blood cells and no bacteria.  He has a creatinine that is elevated from prior usually under 1, but today at 1.57 with a GFR 45.  EKG  ED ECG REPORT I, Ginnie Shams,  the attending physician, personally viewed and interpreted this ECG.   Date: 01/22/2024  EKG Time: 0007  Rate: 105  Rhythm: AF  Axis: nl  Intervals:nl  ST&T Change: no stemi    RADIOLOGY I independently reviewed and interpreted CT of the abdomen and see stranding around the left kidney I also reviewed radiologist's formal read.   PROCEDURES:  Critical Care performed: No  Procedures   MEDICATIONS ORDERED IN ED: Medications  ceFEPIme  (MAXIPIME ) 2 g in sodium chloride  0.9 % 100 mL IVPB (has no administration in time range)  sodium chloride  0.9 % bolus 1,000 mL (0 mLs Intravenous Stopped 01/22/24 0148)  morphine  (PF) 4 MG/ML injection 4 mg (4 mg Intravenous Given 01/22/24 0046)  ondansetron  (ZOFRAN ) injection 4 mg (4 mg Intravenous Given 01/22/24 0046)  iohexol  (OMNIPAQUE ) 300 MG/ML solution 80 mL (80 mLs Intravenous Contrast Given 01/22/24 0128)    IMPRESSION / MDM / ASSESSMENT AND PLAN / ED COURSE  I reviewed the triage vital signs and the nursing notes.                                Patient's presentation is most consistent with acute presentation with potential threat to life or bodily function.  Differential diagnosis includes, but is not limited to, urinary tract infection, obstruction, intra-abdominal infection, enteritis, colitis, obstructive uropathy, kidney stone, mesenteric ischemia considered but less likely   The patient is on the cardiac monitor to evaluate for evidence of arrhythmia and/or significant heart rate changes.  MDM:    Patient with a history of urinary tract infections with pain and symptoms reminiscent of urine infections that led to sepsis in the past.  Mildly tender in the lower abdomen, consider other intra-abdominal infectious or obstructive pathology is better assessed with CT scan.  His initial urinalysis showed leukocytes but no white blood cells and no bacteria.  However the patient knows that this was taken from his leg bag, which he  suspects may be inaccurate as he cleans this bag with Lysol in the morning every day.  We resent another urine from his stoma bag which he just changed this evening for perhaps a more accurate urinalysis.  Fortunately his vital signs and initial labs and his clinical exam are not consistent with sepsis at this time.  Repeat urinalysis unchanged from prior.  Not very much consistent with urine infection.  For relatively benign abdominal exam, no pain out of proportion and normal lactic I highly doubt mesenteric ischemia  For pain I ordered for IV morphine .  Give IV antiemetic for his nausea and vomiting, and will give a liter of fluids for his AKI with some suspicion of dehydration given his poor p.o. intake and vomiting earlier today. Atrial fibrillation with normal blood pressures and mild tachycardia likely driven by his dehydrated state.    -- CT scan with evidence of pyelonephritis.  Looking at the old blood cultures, seem to be multiple organisms that were resistant to a number of drugs, but sensitive to cefepime  so we will start there.  Given his comorbid conditions, structural abnormalities, urinary tract infection that led to sepsis just previously this summer, and AKI, I think it best to have him admitted.     FINAL CLINICAL IMPRESSION(S) / ED DIAGNOSES   Final diagnoses:  Lower abdominal pain  Nausea and vomiting, unspecified vomiting type  Pyelonephritis     Rx / DC Orders   ED Discharge Orders     None        Note:  This document was prepared using Dragon voice recognition software and may include unintentional dictation errors.    Cyrena Mylar, MD 01/22/24 [phone]

## 2024-01-22 NOTE — H&P (Addendum)
 History and Physical    Patient: Donald Barry FMW:[id number] DOB: May 30, 1945 DOA: 01/21/2024 DOS: the patient was seen and examined on 01/22/2024 PCP: Diedra Lame, MD  Patient coming from: Home  Chief Complaint:  Chief Complaint  Patient presents with   Abdominal Pain    HPI: [name] is a 78 y.o. 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  He presented with fatigue, lower abdominal pain and subjective fevers similar to prior presentations for UTI/sepsis. In the ED, pulse 98 but afebrile and normal blood pressure. Labs notable for WBC 10.7 with lactic acid 1.9 Creatinine 1.57 up from baseline of 0.85, urine showing large leuks without bacteria.  Lipase 52 but LFTs unremarkable. EKG showing a fib at 105 CT abdomen and pelvis showing extensive left perinephric stranding concerning for UTI and pyelonephritis as well as mild right hydroureter compared to 10/19/2023 Patient started on cefepime  and given an NS bolus. Admission requested.     Review of Systems: As mentioned in the history of present illness. All other systems reviewed and are negative.  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 SCCis   Squamous cell skin cancer 07/2017   Resected from scalp.   Past Surgical History:  Procedure Laterality Date   BACK SURGERY  2008   L4, L5   COLONOSCOPY     cystectomy with prostatectomy ileal conduit     CYSTO WITH HYDRODISTENSION N/A 06/17/2019   Procedure: CYSTOSCOPY/HYDRODISTENSION;  Surgeon: Francisca Redell BROCKS, MD;  Location: [facility] ORS;  Service: Urology;  Laterality: N/A;   CYSTOSCOPY WITH BIOPSY N/A 07/17/2018   Procedure: CYSTOSCOPY WITH BLADDER BIOPSY;  Surgeon: Francisca Redell BROCKS, MD;  Location: [facility] ORS;  Service: Urology;  Laterality: N/A;   CYSTOSCOPY WITH BIOPSY N/A 06/17/2019   Procedure: CYSTOSCOPY WITH BLADDER  BIOPSY;  Surgeon: Francisca Redell BROCKS, MD;  Location: [facility] ORS;  Service: Urology;  Laterality: N/A;   CYSTOSCOPY WITH FULGERATION N/A 07/17/2018   Procedure: CYSTOSCOPY WITH FULGERATION;  Surgeon: Francisca Redell BROCKS, MD;  Location: [facility] ORS;  Service: Urology;  Laterality: N/A;   CYSTOSCOPY WITH FULGERATION N/A 06/17/2019   Procedure: CYSTOSCOPY WITH FULGERATION;  Surgeon: Francisca Redell BROCKS, MD;  Location: [facility] ORS;  Service: Urology;  Laterality: N/A;   ESOPHAGOGASTRODUODENOSCOPY     HERNIA REPAIR  2012   umbilical   STOMACH SURGERY     FUNDIC GLAND POLYP   Social History:  reports that he has never smoked. He has never used smokeless tobacco. He reports that he does not drink alcohol and does not  use drugs.  Allergies  Allergen Reactions   Tape Dermatitis    Skin blistered- steri strip  Skin blistered   Wound Dressing Adhesive Dermatitis    Skin blistered- steri strip   Shellfish Allergy Diarrhea and Nausea And Vomiting    Shrimp    Shellfish-Derived Products Diarrhea, Nausea And Vomiting and Nausea Only   Silicone Dermatitis    CPAP mask material breaks him out into a whelps and blisters.   Methenamine Diarrhea and Nausea And  Vomiting   Onion Other (See Comments)    diarrhea Diarrhea- verified avoids all onion ingredient (including onion powder). MM, RD 12/19/20   Other Nausea And Vomiting    general anesthesia   Codeine Nausea Only    Family History  Problem Relation Age of Onset   Hypertension Mother    Heart disease Father    GER disease Sister    GER disease Brother    Prostate cancer Neg Hx    Kidney cancer Neg Hx     Prior to Admission medications   Medication Sig Start Date End Date Taking? Authorizing Provider  acetaminophen  (TYLENOL ) 500 MG tablet Take 1,000 mg by mouth. 12/23/20   [provider]  apixaban  (ELIQUIS ) 5 MG TABS tablet Take 1 tablet (5 mg total) by mouth 2 (two) times daily. 05/15/23   Gerard Frederick, NP  atorvastatin  (LIPITOR) 10 MG tablet Take 1 tablet by mouth daily. 08/06/19   [provider]  Coenzyme Q10 (COQ10) 100 MG CAPS Take 1 capsule by mouth daily.    [provider]  Cranberry-Vitamin C-Vitamin E (CRANBERRY PLUS VITAMIN C) 4200-20-3 MG-MG-UNIT CAPS Take 400 mg by mouth.    [provider]  cyanocobalamin  1000 MCG tablet Take 1 tablet (1,000 mcg total) by mouth daily. 10/28/23 04/25/24  Von Bellis, MD  D-Mannose 350 MG CAPS Herbal Name: D-Mannose 1000mg  daily    [provider]  diclofenac Sodium (VOLTAREN) 1 % GEL Apply 1 g topically.    [provider]  ferrous sulfate  325 (65 FE) MG EC tablet TAKE 1 TABLET BY MOUTH EVERY DAY 05/31/22   Rao, Archana C, MD  fluorouracil  (EFUDEX ) 5 % cream Apply BID until reaction develops; up to 10 days for the  scalp and up to 7 days for lateral cheeks, bottom lip. 12/29/23   Claudene Lehmann, MD  fluticasone  (FLONASE ) 50 MCG/ACT nasal spray Place 1 spray into both nostrils daily.    [provider]  guaiFENesin  (MUCINEX ) 600 MG 12 hr tablet Take 600 mg by mouth at bedtime.    [provider]  Hypertonic Nasal Wash (SINUS RINSE) PACK Use as needed for clearing  sinuses of congestion    [provider]  loratadine  (CLARITIN ) 10 MG tablet Take 1 tablet by mouth daily.    [provider]  metoprolol  tartrate (LOPRESSOR ) 25 MG tablet TAKE 1 TABLET BY MOUTH TWICE A DAY 11/24/23   Darron Deatrice LABOR, MD  montelukast (SINGULAIR) 10 MG tablet Take 1 tablet by mouth at bedtime. 07/15/23 07/14/24  [provider]  Multiple Vitamin (MULTIVITAMIN WITH MINERALS) TABS tablet Take 1 tablet by mouth daily. Iron  Free    [provider]  NON FORMULARY Place 1 Dose/kg into the nose daily as needed. Netty pot for sinuses    [provider]  omeprazole (PRILOSEC) 40 MG capsule Take 1 capsule by mouth daily. 10/25/19   [provider]  Probiotic Product (PROBIOTIC BLEND PO) Take by mouth.    [provider]  traZODone  (DESYREL ) 100 MG tablet Take 50 mg by mouth at bedtime. 09/27/22   [provider]    Physical Exam: Vitals:   01/21/24 2329  BP: 121/72  Pulse: 98  Resp: 18  Temp: 98.4 F (36.9 C)  TempSrc: Oral  SpO2: 97%  Weight: 88 kg  Height: 6' (1.829 m)   Physical Exam Vitals and nursing note reviewed.  Constitutional:      General: He is not in acute distress.    Comments: Frail-appearing elderly male  HENT:     Head: Normocephalic and atraumatic.  Cardiovascular:     Rate and Rhythm: Normal rate and regular rhythm.     Heart sounds: Normal heart sounds.  Pulmonary:     Effort: Pulmonary effort is normal.     Breath sounds: Normal breath sounds.  Abdominal:     Palpations: Abdomen is soft.     Tenderness: There is no abdominal tenderness.  Neurological:     Mental Status: Mental status is at baseline.     Labs on Admission: I have personally reviewed following labs and imaging studies  CBC: Recent Labs  Lab 01/21/24 2331  WBC 10.7*  NEUTROABS 9.5*  HGB 14.1  HCT 41.5  MCV 93.7  PLT 123*   Basic Metabolic Panel: Recent Labs  Lab 01/21/24 2331  NA 140  K 4.2  CL  107  CO2 23  GLUCOSE 132*  BUN 19  CREATININE 1.57*  CALCIUM  8.8*   GFR: Estimated Creatinine Clearance: 43.2 mL/min (A) (by C-G formula based on SCr of 1.57 mg/dL (H)). Liver Function Tests: Recent Labs  Lab 01/21/24 2331  AST 25  ALT 16  ALKPHOS 29*  BILITOT 0.5  PROT 6.4*  ALBUMIN  3.7   Recent Labs  Lab 01/21/24 2331  LIPASE 52*   No results for input(s): AMMONIA in the last 168 hours. Coagulation Profile: No results for input(s): INR, PROTIME in the last 168 hours. Cardiac Enzymes: No results for input(s): CKTOTAL, CKMB, CKMBINDEX, TROPONINI in the last 168 hours. BNP (last 3 results) No results for input(s): PROBNP in the last 8760 hours. HbA1C: No results for input(s): HGBA1C in the last 72 hours. CBG: No results for input(s): GLUCAP in the last 168 hours. Lipid Profile: No results for input(s): CHOL, HDL, LDLCALC, TRIG, CHOLHDL, LDLDIRECT in the last 72 hours. Thyroid  Function Tests: No results for input(s): TSH, T4TOTAL, FREET4, T3FREE, THYROIDAB in the last 72 hours. Anemia Panel: No results for input(s): VITAMINB12, FOLATE, FERRITIN, TIBC, IRON , RETICCTPCT in the last 72 hours. Urine analysis:    Component Value Date/Time   COLORURINE YELLOW (A) 01/22/2024 0107   APPEARANCEUR TURBID (A) 01/22/2024 0107   APPEARANCEUR Hazy (A) 06/09/2019 1324   LABSPEC 1.013 01/22/2024 0107   PHURINE 6.0 01/22/2024 0107   GLUCOSEU NEGATIVE 01/22/2024 0107   HGBUR SMALL (A) 01/22/2024 0107   BILIRUBINUR NEGATIVE 01/22/2024 0107   BILIRUBINUR Negative 06/09/2019 1324   KETONESUR NEGATIVE 01/22/2024 0107   PROTEINUR 100 (A) 01/22/2024 0107   NITRITE NEGATIVE 01/22/2024 0107   LEUKOCYTESUR LARGE (A) 01/22/2024 0107    Radiological Exams on Admission: CT ABDOMEN PELVIS W CONTRAST Result Date: 01/22/2024 EXAM: CT ABDOMEN AND PELVIS WITH CONTRAST 01/22/2024 01:40:05 AM TECHNIQUE: CT of the abdomen and pelvis was  performed with the administration of intravenous contrast. Multiplanar reformatted images are provided for review. Automated exposure control, iterative reconstruction, and/or weight-based adjustment of the mA/kV was utilized to reduce the radiation dose to as low as  reasonably achievable. COMPARISON: CT 10/19/2023 CLINICAL HISTORY: Abdominal pain, acute, nonlocalized. Pt reports lower abd pain that began earlier tonight, pt has an Ileal Conduit that was placed in 2021. Pt reports when he has had similar symptoms in the past he has had a UTI and been septic from it. FINDINGS: LOWER CHEST: Moderate hiatal hernia. LIVER: Hepatic steatosis. GALLBLADDER AND BILE DUCTS: Cholelithiasis without evidence of acute cholecystitis. SPLEEN: No acute abnormality. PANCREAS: No acute abnormality. ADRENAL GLANDS: No acute abnormality. KIDNEYS, URETERS AND BLADDER: Postoperative change of cystectomy with right lower quadrant ileal conduit and urostomy. Mild right hydroureter is new compared to 10/19/23. Moderate left hydronephrosis and hydroureter with urothelial thickening and hyperenhancement similar to prior. There is abrupt tapering of both ureters at the anastomosis on the ileal conduit. No ureteral stone. Similar extensive asymmetric left perinephric stranding. Delayed left nephrogram. GI AND BOWEL: Normal appendix. PERITONEUM AND RETROPERITONEUM: No ascites. No free air. VASCULATURE: Aorta is normal in caliber. LYMPH NODES: No lymphadenopathy. REPRODUCTIVE ORGANS: No acute abnormality. BONES AND SOFT TISSUES: No acute osseous abnormality. No focal soft tissue abnormality. IMPRESSION: 1. Urothelial thickening and hyperenhancement about the left ureter with extensive left perinephric stranding and delayed left nephrogram. Findings are concerning for urinary tract infection and pyelonephritis. 2. New mild right hydroureter compared to 10/19/23. Abrupt tapering of both ureters at the anastomosis with the ileal conduit. No urinary  calculi. 3. Cholelithiasis without evidence of acute cholecystitis. 4. Hepatic steatosis. 5. Moderate hiatal hernia. Electronically signed by: Norman Gatlin MD 01/22/2024 01:53 AM EDT RP Workstation: [id number]   Data Reviewed for HPI: Relevant notes from primary care and specialist visits, past discharge summaries as available in EHR, including Care Everywhere. 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  Subjective fevers and fatigue at home.  AKI  Continue cefepime  Follow cultures Received a fluid bolus in the ED Will give IV fluids due to risk for sepsis  AKI (acute kidney injury) (HCC) Creatinine 1.57 up from baseline of 0.85 Received an IV fluid bolus in the ED Continue to monitor  Paroxysmal atrial fibrillation (HCC) Chronic anticoagulation Mild RVR to 105 likely triggered by UTI Continue apixaban  and metoprolol   OSA on CPAP CPAP nightly        DVT prophylaxis: Eliquis   Consults: none  Advance Care Planning:   Code Status: Prior   Family Communication: Wife at bedside  Disposition Plan: Back to previous home environment  Severity of Illness: The appropriate patient status for this patient is OBSERVATION. Observation status is judged to be reasonable and necessary in order to provide the required intensity of service to ensure the patient's safety. The patient's presenting symptoms, physical  exam findings, and initial radiographic and laboratory data in the context of their medical condition is felt to place them at decreased risk for further clinical deterioration. Furthermore, it is anticipated that the patient will be medically stable for discharge from the hospital within 2 midnights of admission.   Author: Delayne LULLA Solian, MD 01/22/2024 2:56 AM  For on call review  [email or web address]

## 2024-01-22 NOTE — Assessment & Plan Note (Addendum)
 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  Subjective fevers and fatigue at home.  AKI  Continue cefepime  Follow cultures Received a fluid bolus in the ED Will give IV fluids due to risk for sepsis

## 2024-01-23 DIAGNOSIS — N10 Acute tubulo-interstitial nephritis: Secondary | ICD-10-CM | POA: Diagnosis not present

## 2024-01-23 LAB — CBC
HCT: 36.9 % — ABNORMAL LOW (ref 39.0–52.0)
Hemoglobin: 12.5 g/dL — ABNORMAL LOW (ref 13.0–17.0)
MCH: 32.3 pg (ref 26.0–34.0)
MCHC: 33.9 g/dL (ref 30.0–36.0)
MCV: 95.3 fL (ref 80.0–100.0)
Platelets: 100 K/uL — ABNORMAL LOW (ref 150–400)
RBC: 3.87 MIL/uL — ABNORMAL LOW (ref 4.22–5.81)
RDW: 13.7 % (ref 11.5–15.5)
WBC: 8 K/uL (ref 4.0–10.5)
nRBC: 0 % (ref 0.0–0.2)

## 2024-01-23 LAB — BASIC METABOLIC PANEL WITH GFR
Anion gap: 8 (ref 5–15)
BUN: 14 mg/dL (ref 8–23)
CO2: 25 mmol/L (ref 22–32)
Calcium: 8.2 mg/dL — ABNORMAL LOW (ref 8.9–10.3)
Chloride: 106 mmol/L (ref 98–111)
Creatinine, Ser: 1.1 mg/dL (ref 0.61–1.24)
GFR, Estimated: >60 mL/min (ref >60)
Glucose, Bld: 140 mg/dL — ABNORMAL HIGH (ref 70–99)
Potassium: 3.8 mmol/L (ref 3.5–5.1)
Sodium: 139 mmol/L (ref 135–145)

## 2024-01-23 MED ORDER — ALUM & MAG HYDROXIDE-SIMETH 200-200-20 MG/5ML PO SUSP
15.0000 mL | ORAL | Status: DC | PRN
  Administered 2024-01-23 – 2024-01-24 (×3): 15 mL via ORAL
  Filled 2024-01-23 (×3): qty 30

## 2024-01-23 NOTE — Progress Notes (Addendum)
 Progress Note    Donald Barry  FMW:[id number] DOB: 05-01-1946  DOA: 01/21/2024 PCP: Diedra Lame, MD      Brief Narrative:    Medical records reviewed and are as summarized below:  Donald Barry is a 78 y.o. 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  He presented to the hospital because of nausea, vomiting, abdominal pain, left flank pain and fever which are similar to the symptoms he developed in June when he was hospitalized for UTI.  He was admitted to the hospital for sepsis secondary to acute left pyelonephritis and AKI.     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    Sepsis secondary to acute left pyelonephritis: Sepsis physiology has resolved.  Continue IV cefepime .  Urine culture growing gram-negative rods. Recent hospitalization in June 2025 for Klebsiella pneumoniae and Proteus mirabilis bacteremia and UTI. History of recurrent UTIs and was previously on Bactrim but this was discontinued after he developed hyperkalemia   AKI: Resolved.  Creatinine down from 1.57-1.10. Hypotension: BP is better.  Discontinue midodrine  and monitor.   Paroxysmal atrial fibrillation: Continue Eliquis  and metoprolol    OSA: On CPAP at night   Chronic thrombocytopenia   Diet Order             Diet Heart Room service appropriate? Yes; Fluid consistency: Thin  Diet effective now                                   Consultants: None  Procedures: None    Medications:    apixaban   5 mg Oral BID   atorvastatin   10 mg Oral Daily   metoprolol  tartrate  25 mg Oral BID   traZODone   50 mg Oral QHS   Continuous Infusions:  ceFEPime  (MAXIPIME ) IV Stopped (01/23/24 0208)     Anti-infectives (From admission, onward)    Start     Dose/Rate Route Frequency Ordered Stop   01/22/24 1400  ceFEPIme  (MAXIPIME ) 2 g in sodium chloride  0.9 % 100 mL IVPB        2 g 200 mL/hr over 30 Minutes Intravenous Every 12 hours 01/22/24 0308     01/22/24 0230  ceFEPIme  (MAXIPIME ) 2 g in sodium chloride  0.9 % 100 mL IVPB        2 g 200 mL/hr over 30 Minutes Intravenous  Once 01/22/24 0216 01/22/24 0310              Family Communication/Anticipated D/C date and plan/Code Status   DVT prophylaxis:  apixaban  (ELIQUIS ) tablet 5 mg     Code Status: Full Code  Family Communication: Plan discussed with his wife and Burnard, daughter, at the bedside Disposition Plan: Plan to discharge home   Status is: Inpatient Remains inpatient appropriate because: Sepsis from UTI       Subjective:   Interval events noted.  He still has some left  flank discomfort.  No vomiting.  Wife and daughter [place]) at the bedside.  Objective:    Vitals:   01/22/24 1700 01/22/24 1939 01/23/24 0312 01/23/24 0746  BP: 91/61 104/78 105/76 111/70  Pulse: 73 78 66 76  Resp: 20 18 18    Temp: 98.9 F (37.2 C) 99.3 F (37.4 C) 98.4 F (36.9 C) 97.8 F (36.6 C)  TempSrc: Oral Oral Oral Oral  SpO2: 97% 97% 97% 95%  Weight:      Height:       No data found.   Intake/Output Summary (Last 24 hours) at 01/23/2024 1210 Last data filed at 01/23/2024 1048 Gross per 24 hour  Intake 1301.05 ml  Output 2700 ml  Net -1398.95 ml   Filed Weights   01/21/24 2329 01/22/24 1352  Weight: 88 kg 88.5 kg    Exam:  GEN: NAD SKIN: No rash EYES: No pallor or icterus ENT:  MMM CV: RRR PULM: CTA B ABD: soft, ND, NT, +BS, +urostomy CNS: AAO x 3, non focal EXT: No edema or tenderness        Data Reviewed:   I have personally reviewed following labs and imaging studies:  Labs: Labs show the following:   Basic Metabolic Panel: Recent Labs  Lab 01/21/24 2331 01/22/24 0523 01/23/24 0955  NA 140 140 139  K 4.2 4.3 3.8  CL 107 109 106  CO2 23 24 25   GLUCOSE 132* 143* 140*  BUN 19 19 14   CREATININE 1.57* 1.33* 1.10  CALCIUM  8.8* 8.3* 8.2*   GFR Estimated Creatinine Clearance: 61.7 mL/min (by C-G formula based on SCr of 1.1 mg/dL). Liver Function Tests: Recent Labs  Lab 01/21/24 2331  AST 25  ALT 16  ALKPHOS 29*  BILITOT 0.5  PROT 6.4*  ALBUMIN  3.7   Recent Labs  Lab 01/21/24 2331  LIPASE 52*   No results for input(s): AMMONIA in the last 168 hours. Coagulation profile No results for input(s): INR, PROTIME in the last 168 hours.  CBC: Recent Labs  Lab 01/21/24 2331 01/22/24 0523 01/23/24 0955  WBC 10.7* 12.4* 8.0  NEUTROABS 9.5*  --   --   HGB 14.1 13.5 12.5*  HCT 41.5 40.1 36.9*  MCV 93.7 94.1 95.3  PLT 123* 116* 100*   Cardiac Enzymes: No results for input(s): CKTOTAL, CKMB, CKMBINDEX, TROPONINI in the last 168 hours. BNP (last 3 results) No results for input(s): PROBNP in the last 8760 hours. CBG: No results for input(s): GLUCAP in the last 168 hours. D-Dimer: No results for input(s): DDIMER in the last 72 hours. Hgb A1c: No results for input(s): HGBA1C in the last 72 hours. Lipid Profile: No results for input(s): CHOL, HDL, LDLCALC, TRIG, CHOLHDL, LDLDIRECT in the last 72 hours. Thyroid  function studies: No results for input(s): TSH, T4TOTAL, T3FREE, THYROIDAB in the last 72 hours.  Invalid input(s): FREET3 Anemia work up: No results for input(s): VITAMINB12, FOLATE, FERRITIN, TIBC, IRON , RETICCTPCT in the last 72 hours. Sepsis Labs: Recent Labs   Lab 01/21/24 2331 01/22/24 0523 01/23/24 0955  WBC 10.7* 12.4* 8.0  LATICACIDVEN 1.9  --   --     Microbiology Recent Results (from the past 240 hours)  Urine Culture     Status: Abnormal (Preliminary result)   Collection Time: 01/21/24 11:30 PM   Specimen: Urine, Clean Catch  Result Value Ref Range Status   Specimen Description   Final    URINE, CLEAN CATCH Performed at [facility], [address] [place],  [place] [address]    Special Requests   Final    NONE Performed at [facility], [address] [place], [place] [address]    Culture (A)  Final    >=100,000 COLONIES/mL ESCHERICHIA COLI CULTURE REINCUBATED FOR BETTER GROWTH SUSCEPTIBILITIES TO FOLLOW Performed at [facility], [address], [place], [place] [address]    Report Status PENDING  Incomplete  Culture, blood (Routine X 2) w Reflex to ID Panel     Status: None (Preliminary result)   Collection Time: 01/22/24  3:37 AM   Specimen: BLOOD  Result Value Ref Range Status   Specimen Description BLOOD BLOOD RIGHT ARM  Final   Special Requests   Final    BOTTLES DRAWN AEROBIC AND ANAEROBIC Blood Culture adequate volume   Culture   Final    NO GROWTH 1 DAY Performed at [facility], [address], [place], [place] [address]    Report Status PENDING  Incomplete  Culture, blood (Routine X 2) w Reflex to ID Panel     Status: None (Preliminary result)   Collection Time: 01/22/24  3:37 AM   Specimen: BLOOD  Result Value Ref Range Status   Specimen Description BLOOD BLOOD LEFT HAND  Final   Special Requests   Final    BOTTLES DRAWN AEROBIC AND ANAEROBIC Blood Culture adequate volume   Culture   Final    NO GROWTH 1 DAY Performed at [facility], [address], [place], [place] [address]    Report Status PENDING  Incomplete    Procedures and diagnostic studies:  CT ABDOMEN PELVIS W CONTRAST Result Date: 01/22/2024 EXAM: CT ABDOMEN AND PELVIS WITH CONTRAST  01/22/2024 01:40:05 AM TECHNIQUE: CT of the abdomen and pelvis was performed with the administration of intravenous contrast. Multiplanar reformatted images are provided for review. Automated exposure control, iterative reconstruction, and/or weight-based adjustment of the mA/kV was utilized to reduce the radiation dose to as low as reasonably achievable. COMPARISON: CT 10/19/2023 CLINICAL HISTORY: Abdominal pain, acute, nonlocalized. Pt reports lower abd pain that began earlier tonight, pt has an Ileal Conduit that was placed in 2021. Pt reports when he has had similar symptoms in the past he has had a UTI and been septic from it. FINDINGS: LOWER CHEST: Moderate hiatal hernia. LIVER: Hepatic steatosis. GALLBLADDER AND BILE DUCTS: Cholelithiasis without evidence of acute cholecystitis. SPLEEN: No acute abnormality. PANCREAS: No acute abnormality. ADRENAL GLANDS: No acute abnormality. KIDNEYS, URETERS AND BLADDER: Postoperative change of cystectomy with right lower quadrant ileal conduit and urostomy. Mild right hydroureter is new compared to 10/19/23. Moderate left hydronephrosis and hydroureter with urothelial thickening and hyperenhancement similar to prior. There is abrupt tapering of both ureters at the anastomosis on the ileal conduit. No ureteral stone. Similar extensive asymmetric left perinephric stranding. Delayed left nephrogram. GI AND BOWEL: Normal appendix. PERITONEUM AND RETROPERITONEUM: No ascites. No free air. VASCULATURE: Aorta is normal in caliber. LYMPH NODES: No lymphadenopathy. REPRODUCTIVE ORGANS: No acute abnormality. BONES AND SOFT TISSUES: No acute osseous abnormality. No focal soft tissue abnormality. IMPRESSION: 1. Urothelial thickening and hyperenhancement about the left ureter with extensive left perinephric stranding and delayed left nephrogram. Findings are concerning for urinary tract infection and pyelonephritis. 2. New mild right hydroureter compared to 10/19/23. Abrupt tapering of  both ureters at the anastomosis with the ileal conduit. No urinary calculi. 3. Cholelithiasis without evidence of acute cholecystitis. 4. Hepatic steatosis. 5. Moderate hiatal hernia. Electronically signed by: Norman Gatlin MD 01/22/2024 01:53 AM  EDT RP Workstation: [id number]               LOS: 1 day   Aerin Delany  [facility]   Pager on [email or web address]. If 7PM-7AM, please contact night-coverage at [email or web address]     01/23/2024, 12:10 PM

## 2024-01-23 NOTE — Plan of Care (Signed)

## 2024-01-23 NOTE — TOC CM/SW Note (Signed)
 Transition of Care [facility]) - Inpatient Brief Assessment   Patient Details  Name: Donald Barry MRN: [id number] Date of Birth: 02/01/1946  Transition of [facility]) CM/SW Contact:    Alfonso Rummer, LCSW Phone Number: 01/23/2024, 11:08 AM   Clinical Narrative: Pt pcp is Dr. Forrest, continued iv abx per hospital note. No TOC needs identified at this time. Should TOC need arise please advise.    Transition of Care Asessment: Insurance and Status: Insurance coverage has been reviewed Patient has primary care physician: Yes (Dr. Forrest) Home environment has been reviewed: Single family home   Prior/Current Home Services: No current home services Social Drivers of Health Review: SDOH reviewed no interventions necessary Readmission risk has been reviewed: Yes Transition of care needs: no transition of care needs at this time

## 2024-01-24 DIAGNOSIS — N10 Acute tubulo-interstitial nephritis: Secondary | ICD-10-CM | POA: Diagnosis not present

## 2024-01-24 LAB — CBC
HCT: 35.8 % — ABNORMAL LOW (ref 39.0–52.0)
Hemoglobin: 12.3 g/dL — ABNORMAL LOW (ref 13.0–17.0)
MCH: 31.8 pg (ref 26.0–34.0)
MCHC: 34.4 g/dL (ref 30.0–36.0)
MCV: 92.5 fL (ref 80.0–100.0)
Platelets: 108 K/uL — ABNORMAL LOW (ref 150–400)
RBC: 3.87 MIL/uL — ABNORMAL LOW (ref 4.22–5.81)
RDW: 13.2 % (ref 11.5–15.5)
WBC: 6.4 K/uL (ref 4.0–10.5)
nRBC: 0 % (ref 0.0–0.2)

## 2024-01-24 MED ORDER — PANTOPRAZOLE SODIUM 40 MG PO TBEC
40.0000 mg | DELAYED_RELEASE_TABLET | Freq: Every day | ORAL | Status: DC
  Administered 2024-01-24: 40 mg via ORAL
  Filled 2024-01-24: qty 1

## 2024-01-24 MED ORDER — FERROUS SULFATE 325 (65 FE) MG PO TBEC: 1.0000 | DELAYED_RELEASE_TABLET | ORAL | Status: AC

## 2024-01-24 MED ORDER — LEVOFLOXACIN 500 MG PO TABS: 500.0000 mg | ORAL_TABLET | Freq: Every day | ORAL | 0 refills | Status: AC

## 2024-01-24 NOTE — Progress Notes (Signed)
 Patient walking the hall with family

## 2024-01-24 NOTE — Discharge Summary (Addendum)
 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 *  Hospital Course:  Donald Barry is a 78 y.o. 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  He presented to the hospital because of nausea, vomiting, abdominal pain, left flank pain and fever which are similar to the symptoms he developed in June when he was hospitalized for UTI.   He was admitted to the hospital for sepsis secondary to acute left pyelonephritis and AKI.   Assessment and Plan:   Sepsis secondary to acute left pyelonephritis: Sepsis physiology has resolved.  Left flank pain has improved.  Urine culture showed E. coli.  He will be discharged on Levaquin  for 5 days to complete 7 days of treatment.  Recent hospitalization in June 2025 for Klebsiella pneumoniae and Proteus mirabilis bacteremia and UTI. History of recurrent UTIs and was previously on Bactrim but this was discontinued after he developed hyperkalemia     AKI: Resolved.  Creatinine down from 1.57-1.10. Hypotension: Resolved     Paroxysmal atrial fibrillation: Continue  Eliquis  and metoprolol      OSA: On CPAP at night     Chronic thrombocytopenia: Platelet count is stable   His condition has improved and he is deemed stable for discharge to home today.        Consultants: None Procedures performed: None Disposition: Home Diet recommendation:  Cardiac diet DISCHARGE MEDICATION: Allergies as of 01/24/2024       Reactions   Tape Dermatitis   Skin blistered- steri strip Skin blistered   Wound Dressing Adhesive Dermatitis   Skin blistered- steri strip   Shellfish Allergy Diarrhea, Nausea And Vomiting   Shrimp   Shellfish-derived Products Diarrhea, Nausea And Vomiting, Nausea Only   Silicone Dermatitis   CPAP mask material breaks him out into a whelps and blisters.   Methenamine Diarrhea, Nausea And Vomiting   Onion Other (See Comments)   diarrhea Diarrhea- verified avoids all onion ingredient (including onion powder). MM, RD 12/19/20   Other Nausea And Vomiting   general anesthesia   Codeine Nausea Only        Medication List     STOP taking these medications    fluorouracil  5 % cream Commonly known as: EFUDEX    NON FORMULARY       TAKE these medications    acetaminophen  500 MG tablet Commonly known as: TYLENOL  Take 1,000 mg by mouth every 6 (six) hours as needed.   apixaban  5 MG Tabs tablet Commonly known as: Eliquis  Take 1 tablet (5 mg total) by mouth 2 (two) times daily.   atorvastatin  10 MG tablet Commonly known as: LIPITOR Take 1 tablet by mouth daily.   CoQ10  100 MG Caps Take 1 capsule by mouth daily.   Cranberry Plus Vitamin C 4200-20-3 MG-MG-UNIT Caps Generic drug: Cranberry-Vitamin C-Vitamin E Take 400 mg by mouth daily.   cyanocobalamin  1000 MCG tablet Take 1 tablet (1,000 mcg total) by mouth daily.   D-Mannose 350 MG Caps Herbal Name: D-Mannose 1000mg  daily   diclofenac Sodium 1 % Gel Commonly known as: VOLTAREN Apply 1 g topically at bedtime as needed. Apply to knees and hamds   ferrous  sulfate 325 (65 FE) MG EC tablet Take 1 tablet (325 mg total) by mouth every Monday, Wednesday, and Friday. Start taking on: January 26, 2024   fluticasone  50 MCG/ACT nasal spray Commonly known as: FLONASE  Place 1 spray into both nostrils at bedtime.   guaiFENesin  600 MG 12 hr tablet Commonly known as: MUCINEX  Take 600 mg by mouth 2 (two) times daily.   levofloxacin  500 MG tablet Commonly known as: LEVAQUIN  Take 1 tablet (500 mg total) by mouth daily for 5 days.   loratadine  10 MG tablet Commonly known as: CLARITIN  Take 1 tablet by mouth daily as needed for allergies.   metoprolol  tartrate 25 MG tablet Commonly known as: LOPRESSOR  TAKE 1 TABLET BY MOUTH TWICE A DAY   montelukast 10 MG tablet Commonly known as: SINGULAIR Take 1 tablet by mouth at bedtime.   multivitamin with minerals Tabs tablet Take 1 tablet by mouth daily. Iron  Free   omeprazole 40 MG capsule Commonly known as: PRILOSEC Take 1 capsule by mouth daily.   PROBIOTIC BLEND PO Take 1 capsule by mouth 2 (two) times daily.   Sinus Rinse Pack Use as needed for clearing sinuses of congestion   traZODone  50 MG tablet Commonly known as: DESYREL  Take 75 mg by mouth at bedtime.        Discharge Exam: Filed Weights   01/21/24 2329 01/22/24 1352  Weight: 88 kg 88.5 kg   GEN: NAD SKIN: Warm and dry EYES: No pallor or icterus ENT: MMM CV: RRR PULM: CTA B ABD: soft, ND, NT, +BS CNS: AAO x 3, non focal EXT: No edema or tenderness GU: No CVA or flank tenderness.  + Urostomy with amber urine   Condition at discharge: good  The results of significant diagnostics from this hospitalization (including imaging, microbiology, ancillary and laboratory) are listed below for reference.   Imaging Studies: CT ABDOMEN PELVIS W CONTRAST Result Date: 01/22/2024 EXAM: CT ABDOMEN AND PELVIS WITH CONTRAST 01/22/2024 01:40:05 AM TECHNIQUE: CT of the abdomen and pelvis was performed with the administration of  intravenous contrast. Multiplanar reformatted images are provided for review. Automated exposure control, iterative reconstruction, and/or weight-based adjustment of the mA/kV was utilized to reduce the radiation dose to as low as reasonably achievable. COMPARISON: CT 10/19/2023 CLINICAL HISTORY: Abdominal pain, acute, nonlocalized. Pt reports lower abd pain that began earlier tonight, pt has an Ileal Conduit that was placed in 2021. Pt reports when he has had similar symptoms in the past he has had a UTI and been septic from it. FINDINGS: LOWER CHEST: Moderate hiatal hernia. LIVER: Hepatic steatosis. GALLBLADDER AND BILE DUCTS: Cholelithiasis without evidence of acute cholecystitis. SPLEEN: No acute abnormality. PANCREAS: No acute abnormality. ADRENAL GLANDS: No acute abnormality. KIDNEYS, URETERS AND BLADDER: Postoperative change of cystectomy with right lower quadrant ileal conduit and urostomy. Mild right hydroureter is new compared to 10/19/23. Moderate left hydronephrosis and hydroureter with urothelial thickening and hyperenhancement similar to prior. There is abrupt tapering of both ureters at the anastomosis on the ileal conduit.  No ureteral stone. Similar extensive asymmetric left perinephric stranding. Delayed left nephrogram. GI AND BOWEL: Normal appendix. PERITONEUM AND RETROPERITONEUM: No ascites. No free air. VASCULATURE: Aorta is normal in caliber. LYMPH NODES: No lymphadenopathy. REPRODUCTIVE ORGANS: No acute abnormality. BONES AND SOFT TISSUES: No acute osseous abnormality. No focal soft tissue abnormality. IMPRESSION: 1. Urothelial thickening and hyperenhancement about the left ureter with extensive left perinephric stranding and delayed left nephrogram. Findings are concerning for urinary tract infection and pyelonephritis. 2. New mild right hydroureter compared to 10/19/23. Abrupt tapering of both ureters at the anastomosis with the ileal conduit. No urinary calculi. 3. Cholelithiasis without  evidence of acute cholecystitis. 4. Hepatic steatosis. 5. Moderate hiatal hernia. Electronically signed by: Norman Gatlin MD 01/22/2024 01:53 AM EDT RP Workstation: [id number]    Microbiology: Results for orders placed or performed during the hospital encounter of 01/21/24  Urine Culture     Status: Abnormal (Preliminary result)   Collection Time: 01/21/24 11:30 PM   Specimen: Urine, Clean Catch  Result Value Ref Range Status   Specimen Description   Final    URINE, CLEAN CATCH Performed at [facility], [address], [place], [place] [address]    Special Requests   Final    NONE Performed at [facility], [address], [place], [place] [address]    Culture (A)  Final    >=100,000 COLONIES/mL ESCHERICHIA COLI 10,000 COLONIES/mL PSEUDOMONAS AERUGINOSA SUSCEPTIBILITIES TO FOLLOW Performed at [facility], [address], [place], [place] [address]    Report Status PENDING  Incomplete   Organism ID, Bacteria ESCHERICHIA COLI (A)  Final      Susceptibility   Escherichia coli - MIC*    AMPICILLIN 8 SENSITIVE Sensitive     CEFAZOLIN  (URINE) Value in next row Sensitive      <=1 SENSITIVEThis is a modified FDA-approved test that has been validated and its performance characteristics determined by the reporting laboratory.  This laboratory is certified under the Clinical Laboratory Improvement Amendments CLIA as qualified to perform high complexity clinical laboratory testing.    CEFEPIME  Value in next row Sensitive      <=1 SENSITIVEThis is a modified FDA-approved test that has been validated and its performance characteristics determined by the reporting laboratory.  This laboratory is certified under the Clinical Laboratory Improvement Amendments CLIA as qualified to perform high complexity clinical laboratory testing.    ERTAPENEM  Value in next row Sensitive      <=1 SENSITIVEThis is a modified FDA-approved test that has been validated and its performance  characteristics determined by the reporting laboratory.  This laboratory is certified under the Clinical Laboratory Improvement Amendments CLIA as qualified to perform high complexity clinical laboratory testing.    CEFTRIAXONE  Value in next row Sensitive      <=1 SENSITIVEThis is a modified FDA-approved test that has been validated and its performance characteristics determined by the reporting laboratory.  This laboratory is certified under the Clinical Laboratory Improvement Amendments CLIA as qualified to perform high complexity clinical laboratory testing.    CIPROFLOXACIN  Value in next row Sensitive      <=1 SENSITIVEThis is a modified FDA-approved test that has been validated and its performance characteristics determined by the reporting laboratory.  This laboratory is certified under the Clinical Laboratory Improvement Amendments CLIA as qualified to perform high complexity clinical laboratory testing.    GENTAMICIN Value in next row Sensitive      <=1 SENSITIVEThis is a modified FDA-approved test that has been  validated and its performance characteristics determined by the reporting laboratory.  This laboratory is certified under the Clinical Laboratory Improvement Amendments CLIA as qualified to perform high complexity clinical laboratory testing.    NITROFURANTOIN Value in next row Sensitive      <=1 SENSITIVEThis is a modified FDA-approved test that has been validated and its performance characteristics determined by the reporting laboratory.  This laboratory is certified under the Clinical Laboratory Improvement Amendments CLIA as qualified to perform high complexity clinical laboratory testing.    TRIMETH/SULFA Value in next row Sensitive      <=1 SENSITIVEThis is a modified FDA-approved test that has been validated and its performance characteristics determined by the reporting laboratory.  This laboratory is certified under the Clinical Laboratory Improvement Amendments CLIA as qualified  to perform high complexity clinical laboratory testing.    AMPICILLIN/SULBACTAM Value in next row Sensitive      <=1 SENSITIVEThis is a modified FDA-approved test that has been validated and its performance characteristics determined by the reporting laboratory.  This laboratory is certified under the Clinical Laboratory Improvement Amendments CLIA as qualified to perform high complexity clinical laboratory testing.    PIP/TAZO Value in next row Sensitive ug/mL     <=4 SENSITIVEThis is a modified FDA-approved test that has been validated and its performance characteristics determined by the reporting laboratory.  This laboratory is certified under the Clinical Laboratory Improvement Amendments CLIA as qualified to perform high complexity clinical laboratory testing.    MEROPENEM  Value in next row Sensitive      <=4 SENSITIVEThis is a modified FDA-approved test that has been validated and its performance characteristics determined by the reporting laboratory.  This laboratory is certified under the Clinical Laboratory Improvement Amendments CLIA as qualified to perform high complexity clinical laboratory testing.    * >=100,000 COLONIES/mL ESCHERICHIA COLI  Culture, blood (Routine X 2) w Reflex to ID Panel     Status: None (Preliminary result)   Collection Time: 01/22/24  3:37 AM   Specimen: BLOOD  Result Value Ref Range Status   Specimen Description BLOOD BLOOD RIGHT ARM  Final   Special Requests   Final    BOTTLES DRAWN AEROBIC AND ANAEROBIC Blood Culture adequate volume   Culture   Final    NO GROWTH 2 DAYS Performed at [facility], [address] [place], [place] [address]    Report Status PENDING  Incomplete  Culture, blood (Routine X 2) w Reflex to ID Panel     Status: None (Preliminary result)   Collection Time: 01/22/24  3:37 AM   Specimen: BLOOD  Result Value Ref Range Status   Specimen Description BLOOD BLOOD LEFT HAND  Final   Special Requests   Final    BOTTLES DRAWN  AEROBIC AND ANAEROBIC Blood Culture adequate volume   Culture   Final    NO GROWTH 2 DAYS Performed at [facility], [address] [place], [place] [address]    Report Status PENDING  Incomplete    Labs: CBC: Recent Labs  Lab 01/21/24 2331 01/22/24 0523 01/23/24 0955 01/24/24 0547  WBC 10.7* 12.4* 8.0 6.4  NEUTROABS 9.5*  --   --   --   HGB 14.1 13.5 12.5* 12.3*  HCT 41.5 40.1 36.9* 35.8*  MCV 93.7 94.1 95.3 92.5  PLT 123* 116* 100* 108*   Basic Metabolic Panel: Recent Labs  Lab 01/21/24 2331 01/22/24 0523 01/23/24 0955  NA 140 140 139  K 4.2 4.3 3.8  CL  107 109 106  CO2 23 24 25   GLUCOSE 132* 143* 140*  BUN 19 19 14   CREATININE 1.57* 1.33* 1.10  CALCIUM  8.8* 8.3* 8.2*   Liver Function Tests: Recent Labs  Lab 01/21/24 2331  AST 25  ALT 16  ALKPHOS 29*  BILITOT 0.5  PROT 6.4*  ALBUMIN  3.7   CBG: No results for input(s): GLUCAP in the last 168 hours.  Discharge time spent: greater than 30 minutes.  Signed: AIDA CHO, MD [facility] 01/24/2024

## 2024-01-24 NOTE — Plan of Care (Signed)

## 2024-01-24 NOTE — Plan of Care (Signed)
   Problem: Fluid Volume: Goal: Hemodynamic stability will improve Outcome: Progressing   Problem: Clinical Measurements: Goal: Diagnostic test results will improve Outcome: Progressing Goal: Signs and symptoms of infection will decrease Outcome: Progressing   Problem: Respiratory: Goal: Ability to maintain adequate ventilation will improve Outcome: Progressing

## 2024-01-24 NOTE — Plan of Care (Signed)
  Problem: Fluid Volume: Goal: Hemodynamic stability will improve 01/24/2024 1613 by Rosalio Tully SAILOR, RN Outcome: Adequate for Discharge 01/24/2024 1421 by Rosalio Tully SAILOR, RN Outcome: Progressing   Problem: Clinical Measurements: Goal: Diagnostic test results will improve 01/24/2024 1613 by Rosalio Tully SAILOR, RN Outcome: Adequate for Discharge 01/24/2024 1421 by Rosalio Tully SAILOR, RN Outcome: Progressing Goal: Signs and symptoms of infection will decrease 01/24/2024 1613 by Rosalio Tully SAILOR, RN Outcome: Adequate for Discharge 01/24/2024 1421 by Rosalio Tully SAILOR, RN Outcome: Progressing   Problem: Respiratory: Goal: Ability to maintain adequate ventilation will improve 01/24/2024 1613 by Rosalio Tully SAILOR, RN Outcome: Adequate for Discharge 01/24/2024 1421 by Rosalio Tully SAILOR, RN Outcome: Progressing   Problem: Education: Goal: Knowledge of General Education information will improve Description: Including pain rating scale, medication(s)/side effects and non-pharmacologic comfort measures 01/24/2024 1613 by Rosalio Tully SAILOR, RN Outcome: Adequate for Discharge 01/24/2024 1421 by Rosalio Tully SAILOR, RN Outcome: Progressing   Problem: Health Behavior/Discharge Planning: Goal: Ability to manage health-related needs will improve 01/24/2024 1613 by Rosalio Tully SAILOR, RN Outcome: Adequate for Discharge 01/24/2024 1421 by Rosalio Tully SAILOR, RN Outcome: Progressing   Problem: Clinical Measurements: Goal: Ability to maintain clinical measurements within normal limits will improve 01/24/2024 1613 by Rosalio Tully SAILOR, RN Outcome: Adequate for Discharge 01/24/2024 1421 by Rosalio Tully SAILOR, RN Outcome: Progressing Goal: Will remain free from infection 01/24/2024 1613 by Rosalio Tully SAILOR, RN Outcome: Adequate for Discharge 01/24/2024 1421 by Rosalio Tully SAILOR, RN Outcome: Progressing Goal: Diagnostic test results will improve 01/24/2024 1613 by Rosalio Tully SAILOR, RN Outcome: Adequate for Discharge 01/24/2024 1421 by  Rosalio Tully SAILOR, RN Outcome: Progressing Goal: Respiratory complications will improve 01/24/2024 1613 by Rosalio Tully SAILOR, RN Outcome: Adequate for Discharge 01/24/2024 1421 by Rosalio Tully SAILOR, RN Outcome: Progressing Goal: Cardiovascular complication will be avoided 01/24/2024 1613 by Rosalio Tully SAILOR, RN Outcome: Adequate for Discharge 01/24/2024 1421 by Rosalio Tully SAILOR, RN Outcome: Progressing   Problem: Activity: Goal: Risk for activity intolerance will decrease 01/24/2024 1613 by Rosalio Tully SAILOR, RN Outcome: Adequate for Discharge 01/24/2024 1421 by Rosalio Tully SAILOR, RN Outcome: Progressing   Problem: Nutrition: Goal: Adequate nutrition will be maintained 01/24/2024 1613 by Rosalio Tully SAILOR, RN Outcome: Adequate for Discharge 01/24/2024 1421 by Rosalio Tully SAILOR, RN Outcome: Progressing   Problem: Coping: Goal: Level of anxiety will decrease 01/24/2024 1613 by Rosalio Tully SAILOR, RN Outcome: Adequate for Discharge 01/24/2024 1421 by Rosalio Tully SAILOR, RN Outcome: Progressing   Problem: Elimination: Goal: Will not experience complications related to bowel motility 01/24/2024 1613 by Rosalio Tully SAILOR, RN Outcome: Adequate for Discharge 01/24/2024 1421 by Rosalio Tully SAILOR, RN Outcome: Progressing Goal: Will not experience complications related to urinary retention 01/24/2024 1613 by Rosalio Tully SAILOR, RN Outcome: Adequate for Discharge 01/24/2024 1421 by Rosalio Tully SAILOR, RN Outcome: Progressing   Problem: Pain Managment: Goal: General experience of comfort will improve and/or be controlled 01/24/2024 1613 by Rosalio Tully SAILOR, RN Outcome: Adequate for Discharge 01/24/2024 1421 by Rosalio Tully SAILOR, RN Outcome: Progressing   Problem: Safety: Goal: Ability to remain free from injury will improve 01/24/2024 1613 by Rosalio Tully SAILOR, RN Outcome: Adequate for Discharge 01/24/2024 1421 by Rosalio Tully SAILOR, RN Outcome: Progressing   Problem: Skin Integrity: Goal: Risk for impaired skin integrity  will decrease 01/24/2024 1613 by Rosalio Tully SAILOR, RN Outcome: Adequate for Discharge 01/24/2024 1421 by Rosalio Tully SAILOR, RN Outcome: Progressing

## 2024-01-25 LAB — URINE CULTURE: Culture: >=100000 — AB

## 2024-01-27 LAB — CULTURE, BLOOD (ROUTINE X 2)
Culture: NO GROWTH
Culture: NO GROWTH
Special Requests: ADEQUATE
Special Requests: ADEQUATE

## 2024-03-12 ENCOUNTER — Encounter: Payer: Self-pay | Admitting: Oncology

## 2024-03-12 NOTE — Telephone Encounter (Signed)
 Encounter opened in error.

## 2024-03-29 NOTE — Nursing Note (Signed)
 Discussed AVS with patient and patient's daughter. Discussed plans for home health IV abx administration and future PICC line removal once antibiotics are completed. Removed left hand peripheral IV without difficulty. Flushed double lumen PICC. PICC flushes with blood return without difficulty. Dressing clean, dry, intact. Patient's personal belongings with patient upon discharge. Patient denies pain/distress at time of discharge.

## 2024-04-15 NOTE — Telephone Encounter (Signed)
 Called IR to schedule there Phys will review and they will contact patient to schedule patient is aware of plan

## 2024-04-16 ENCOUNTER — Ambulatory Visit: Attending: Cardiology | Admitting: Cardiology

## 2024-04-16 ENCOUNTER — Encounter: Payer: Self-pay | Admitting: Cardiology

## 2024-04-16 VITALS — BP 120/76 | HR 75 | Ht 72.0 in | Wt 198.0 lb

## 2024-04-16 DIAGNOSIS — I480 Paroxysmal atrial fibrillation: Secondary | ICD-10-CM | POA: Diagnosis not present

## 2024-04-16 DIAGNOSIS — I700 Atherosclerosis of aorta: Secondary | ICD-10-CM | POA: Diagnosis not present

## 2024-04-16 DIAGNOSIS — G4733 Obstructive sleep apnea (adult) (pediatric): Secondary | ICD-10-CM | POA: Diagnosis not present

## 2024-04-16 DIAGNOSIS — E782 Mixed hyperlipidemia: Secondary | ICD-10-CM | POA: Diagnosis not present

## 2024-04-16 DIAGNOSIS — Z01810 Encounter for preprocedural cardiovascular examination: Secondary | ICD-10-CM

## 2024-04-16 MED ORDER — APIXABAN 5 MG PO TABS: 5.0000 mg | ORAL_TABLET | Freq: Two times a day (BID) | ORAL | 3 refills | Status: AC

## 2024-04-16 NOTE — Patient Instructions (Signed)
 Medication Instructions:  Your physician recommends that you continue on your current medications as directed. Please refer to the Current Medication list given to you today.   *If you need a refill on your cardiac medications before your next appointment, please call your pharmacy*  Lab Work: No labs ordered today  If you have labs (blood work) drawn today and your tests are completely normal, you will receive your results only by: MyChart Message (if you have MyChart) OR A paper copy in the mail If you have any lab test that is abnormal or we need to change your treatment, we will call you to review the results.  Testing/Procedures: No test ordered today   Follow-Up: At [facility], you and your health needs are our priority.  As part of our continuing mission to provide you with exceptional heart care, our providers are all part of one team.  This team includes your primary Cardiologist (physician) and Advanced Practice Providers or APPs (Physician Assistants and Nurse Practitioners) who all work together to provide you with the care you need, when you need it.  Your next appointment:   6 month(s)  Provider:   You may see Deatrice Cage, MD or one of the following Advanced Practice Providers on your designated Care Team:   Tylene Lunch, NP

## 2024-04-16 NOTE — Telephone Encounter (Signed)
 Dr. Herb:  See below message/reply from [facility]

## 2024-04-16 NOTE — Progress Notes (Signed)
 Cardiology Office Note   Date:  04/16/2024  ID:  Donald, Barry 06/06/1945, MRN [id number] PCP: Diedra Lame, MD  [facility] Providers Cardiologist:  Deatrice Cage, MD     History of Present Illness Donald Barry is a 78 y.o. 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   During hospitalization in 01/2021 with pyelonephritis complicated by sepsis due to Enterobacter he was found to be in atrial fibrillation with RVR. He was treated with rate control and subsequently converted to sinus rhythm. He was initially not anticoagulated due to CHA2DS2-VASc score of 1 and other medical issues making him at risk for bleeding. Echocardiogram revealed EF 50-25%, calcified aortic valve without significant stenosis. He experienced recurrent atrial fibrillation with RVR in the setting of having a sleep study done. He underwent extensive GI workup for blood loss anemia which had been nonrevealing. Recurrent A-fib RVR in 01/2022 was noted to be in sinus rhythm in the ED. He had also recently been diagnosed with a COVID infection. He was subsequently started on apixaban  for stroke prevention. He was hospitalized again at [facility] in late December for MSSA bacteremia due to UTI. He was treated with antibiotics and unfortunately had another admission in January for recurrent infection. He presented to the [facility] emergency department on 01/05/2023 with complaints of chest pain that woke him up. He was also found to be back in atrial fibrillation. He stated he took 1 metoprolol  with no relief. He had had mild pressure in the center of his chest and states it was not typical for his episodes of atrial fibrillation. Workup in the emergency department was unrevealing.  Lexiscan  Myoview  completed 01/20/2023 for was  considered low risk without concerns for significant ischemia.  His Lexiscan  Myoview  in September 2024 which showed no evidence of ischemia and normal ejection fraction.  No significant aortic or coronary calcifications were noted.  He had emergency visit in February for palpitations mild chest pain.  Troponin was normal.  He was noted to be in atrial fibrillation with ventricular rate of 80 bpm.  He reports that he stayed in atrial fibrillation for 12 hours then went back to sinus rhythm.  He stated he was having issues with the CPAP but due to congestion on his return appointment 09/16/2023.  If his episodes of atrial fibrillation became more frequent we will refer to EP for consideration of antiarrhythmic medication versus ablation.  He was hospitalized at [facility] from 11/11 - 03/29/2024 for complicated UTI.  Patient was followed during his hospitalization by infectious disease who is managing antibiotic therapy as he had an ESBL infection.  PICC line was placed on 03/28/2024 for him to finish his course at home and to resume his maintenance nitrofurantoin. He was to maintain close follow up with Urology.    He returns to clinic today overall doing well from a cardiac perspective.  He denies any chest pain, shortness of breath, dyspnea on exertion or lightheadedness.  He recently was hospitalized for complicated UTI with opacities upcoming procedure per radiology for possible stent placement.  States that he has been compliant with his current medication regimen.  Has not missed any apixaban .  Denies any issues with bleeding with no blood noted in his urine or stool.  Unfortunately states he sees had several hospitalizations this year.  ROS: 10 point review of  system has been reviewed and considered negative the exception was been listed in the HPI  Studies Reviewed EKG Interpretation Date/Time:  Friday April 16 2024 15:10:05 EST Ventricular Rate:  75 PR Interval:  178 QRS  Duration:  82 QT Interval:  386 QTC Calculation: 431 R Axis:   17  Text Interpretation: Normal sinus rhythm Normal ECG When compared with ECG of 22-Jan-2024 00:07, Confirmed by Donald Barry ([id number]) on 04/16/2024 3:13:04 PM    Lexiscan  MPI 01/30/23 Pharmacological myocardial perfusion imaging study with no significant  ischemia Normal wall motion, EF estimated at 58% No EKG changes concerning for ischemia at peak stress or in recovery. CT attenuation correction images with aortic valve calcification, no significant coronary artery or aortic calcification Low risk scan   TTE 01/12/21 1. Left ventricular ejection fraction, by estimation, is 50 to 55%. The  left ventricle has low normal function. The left ventricle demonstrates  global hypokinesis. Left ventricular diastolic parameters are consistent  with Grade III diastolic dysfunction   (restrictive).   2. Right ventricular systolic function is normal. The right ventricular  size is normal.   3. 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 No  evidence of mitral stenosis.   4. The aortic valve is normal in structure. Aortic valve regurgitation is  mild. Mild aortic valve sclerosis is present, with no evidence of aortic  valve stenosis.   5. The inferior vena cava is normal in size with greater than 50%  respiratory variability, suggesting right atrial pressure of 3 mmHg.   Risk Assessment/Calculations  CHA2DS2-VASc Score = 3   This indicates a 3.2% annual risk of stroke. The patient's score is based upon: CHF History: 0 HTN History: 0 Diabetes History: 0 Stroke History: 0 Vascular Disease History: 1 Age Score: 2 Gender Score: 0            Physical Exam VS:  BP 120/76 (BP Location: Left Arm, Patient Position: Sitting, Cuff Size: Normal)   Pulse 75   Ht 6' (1.829 m)   Wt 198 lb (89.8 kg)   SpO2 99%   BMI 26.85 kg/m        Wt Readings from Last 3 Encounters:  04/16/24 198 lb (89.8 kg)   01/22/24 195 lb (88.5 kg)  11/13/23 193 lb 8 oz (87.8 kg)    GEN: Well nourished, well developed in no acute distress NECK: No JVD; No carotid bruits CARDIAC: RRR, no murmurs, rubs, gallops RESPIRATORY:  Clear to auscultation without rales, wheezing or rhonchi  ABDOMEN: Soft, non-tender, non-distended EXTREMITIES:  No edema; No deformity   ASSESSMENT AND PLAN Paroxysmal atrial fibrillation where he maintains sinus rhythm with EKG today revealing sinus rhythm with no acute ischemic changes noted.  He is continued on apixaban  5 mg twice daily for CHA2DS2-VASc release 3 for stroke prophylaxis and metoprolol  titrate 12.5 mg twice daily.  He states that he has had 1 episode of breakthrough atrial fibrillation during recent hospitalization but was short-lived.  Previously was advised that he continue to have increased amount of breakthrough atrial fibrillation he may need a referral to EP for possible antiarrhythmics versus ablation procedure.  Aortic atherosclerosis without symptoms concerning for angina or anginal equivalents.  Continue with primary prevention with atorvastatin  10 mg daily and apixaban  and lieu of aspirin .  No further ischemic evaluation needed at this time.  Hyperlipidemia with an LDL of 57.  He continues on atorvastatin  10 mg daily.  This continues to be monitored by his PCP.  Sleep apnea compliant with CPAP.  Preoperative cardiovascular examination for upcoming interventional radiology procedure with renal artery stent placement    Mr. Raborn's perioperative risk of a major cardiac event is 0.4% according to the Revised Cardiac Risk Index (RCRI).  Therefore, he is at low risk for perioperative complications.   His functional capacity is good at 8.23 METs according to the Duke Activity Status Index (DASI). Recommendations: According to ACC/AHA guidelines, no further cardiovascular testing needed.  The patient may proceed to surgery at acceptable risk.   Antiplatelet and/or  Anticoagulation Recommendations:  Eliquis  (Apixaban ) can be held for 2 days prior to surgery.  Please resume post op when felt to be safe.      Dispo: Patient to return to clinic to see MD/APP in 6 months or sooner if needed for reevaluation  Signed, Minas Bonser, NP

## 2024-04-30 ENCOUNTER — Ambulatory Visit: Payer: Medicare HMO | Admitting: Oncology

## 2024-04-30 ENCOUNTER — Other Ambulatory Visit: Payer: Medicare HMO

## 2024-05-14 ENCOUNTER — Inpatient Hospital Stay: Attending: Oncology

## 2024-05-14 DIAGNOSIS — D509 Iron deficiency anemia, unspecified: Secondary | ICD-10-CM

## 2024-05-14 LAB — CBC (CANCER CENTER ONLY)
HCT: 41.5 % (ref 39.0–52.0)
Hemoglobin: 14.3 g/dL (ref 13.0–17.0)
MCH: 31.7 pg (ref 26.0–34.0)
MCHC: 34.5 g/dL (ref 30.0–36.0)
MCV: 92 fL (ref 80.0–100.0)
Platelet Count: 141 K/uL — ABNORMAL LOW (ref 150–400)
RBC: 4.51 MIL/uL (ref 4.22–5.81)
RDW: 13.2 % (ref 11.5–15.5)
WBC Count: 5.5 K/uL (ref 4.0–10.5)
nRBC: 0 % (ref 0.0–0.2)

## 2024-05-14 LAB — IRON AND TIBC
Iron: 90 ug/dL (ref 45–182)
Saturation Ratios: 32 % (ref 17.9–39.5)
TIBC: 280 ug/dL (ref 250–450)
UIBC: 190 ug/dL

## 2024-05-14 LAB — FERRITIN: Ferritin: 117 ng/mL (ref 24–336)

## 2024-05-25 ENCOUNTER — Ambulatory Visit: Admitting: Podiatry

## 2024-05-25 DIAGNOSIS — Q828 Other specified congenital malformations of skin: Secondary | ICD-10-CM

## 2024-05-25 NOTE — Progress Notes (Signed)
 "  Subjective:  Patient ID: Donald Barry, male    DOB: May 29, 1945,  MRN: [id number]  Chief Complaint  Patient presents with   Callouses    79 y.o. male presents with the above complaint.  Patient presents with left submetatarsal 5 porokeratotic lesion/plantarflexed fifth metatarsal painful to touch is progressing and worse painful to walk he would like to discuss treatment options for this has not seen MRIs prior to seeing me.  Feels like stepping on a rock or stone.  Pain scale 7 out of 10 dull aching nature.  He has tried wearing shoes which helps.  He cannot go barefooted on hard surfaces   Review of Systems: Negative except as noted in the HPI. Denies N/V/F/Ch.  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 SCCis   Squamous cell skin cancer 07/2017   Resected from scalp.   Current Medications[1]  Tobacco Use History[2]  Allergies[3] Objective:  There were no vitals filed for this visit. There is no height or weight on file to calculate BMI. Constitutional Well developed. Well nourished.  Vascular Dorsalis pedis pulses palpable bilaterally. Posterior tibial pulses palpable bilaterally. Capillary refill normal to all digits.  No cyanosis or clubbing noted. Pedal hair growth normal.  Neurologic Normal speech. Oriented to person, place, and  time. Epicritic sensation to light touch grossly present bilaterally.  Dermatologic Left submetatarsal 5 porokeratotic lesion painful to touch with central nucleated core pain on palpation to the lesion.  No pinpoint bleeding noted  Orthopedic: Normal joint ROM without pain or crepitus bilaterally. No visible deformities. No bony tenderness.   Radiographs: None Assessment:   1. Porokeratosis    Plan:  Patient was evaluated and treated and all questions answered.  Left submetatarsal 5 porokeratosis - All questions and concerns were discussed with the patient in extensive detail given the amount of pain that he is experiencing he will benefit from using chisel blade handle aggressive debridement of the lesion.  Using chisel blade handle the lesion was debride down to healthy stripe tissue no complication noted no pinpoint bleeding noted. - Shoe gear modification discussed  No follow-ups on file.    [1]  Current Outpatient Medications:    acetaminophen  (TYLENOL ) 500 MG tablet, Take 1,000 mg by mouth every 6 (six) hours as needed., Disp: , Rfl:    apixaban  (ELIQUIS ) 5 MG TABS tablet, Take 1 tablet (5 mg total) by mouth 2 (two) times daily., Disp: 180 tablet, Rfl: 3   atorvastatin  (LIPITOR) 10 MG tablet, Take 1 tablet by mouth daily., Disp: , Rfl:    Coenzyme Q10 (COQ10) 100 MG CAPS, Take 1 capsule by mouth daily., Disp: , Rfl:    Cranberry-Vitamin C-Vitamin E (CRANBERRY PLUS VITAMIN C) 4200-20-3 MG-MG-UNIT CAPS, Take 400 mg by mouth daily., Disp: ,  Rfl:    D-Mannose 350 MG CAPS, Herbal Name: D-Mannose 1000mg  daily, Disp: , Rfl:    diclofenac Sodium (VOLTAREN) 1 % GEL, Apply 1 g topically at bedtime as needed. Apply to knees and hamds, Disp: , Rfl:    ferrous sulfate  325 (65 FE) MG EC tablet, Take 1 tablet (325 mg total) by mouth every Monday, Wednesday, and Friday., Disp: , Rfl:    fluticasone  (FLONASE ) 50 MCG/ACT nasal spray, Place 1 spray into both nostrils at bedtime., Disp: , Rfl:     guaiFENesin  (MUCINEX ) 600 MG 12 hr tablet, Take 600 mg by mouth 2 (two) times daily., Disp: , Rfl:    Hypertonic Nasal Wash (SINUS RINSE) PACK, Use as needed for clearing sinuses of congestion, Disp: , Rfl:    loratadine  (CLARITIN ) 10 MG tablet, Take 1 tablet by mouth daily as needed for allergies., Disp: , Rfl:    metoprolol  tartrate (LOPRESSOR ) 25 MG tablet, TAKE 1 TABLET BY MOUTH TWICE A DAY, Disp: 180 tablet, Rfl: 3   montelukast (SINGULAIR) 10 MG tablet, Take 1 tablet by mouth at bedtime., Disp: , Rfl:    Multiple Vitamin (MULTIVITAMIN WITH MINERALS) TABS tablet, Take 1 tablet by mouth daily. Iron  Free, Disp: , Rfl:    nitrofurantoin, macrocrystal-monohydrate, (MACROBID) 100 MG capsule, Take 100 mg by mouth every morning., Disp: , Rfl:    omeprazole (PRILOSEC) 40 MG capsule, Take 1 capsule by mouth daily., Disp: , Rfl:    Probiotic Product (PROBIOTIC BLEND PO), Take 1 capsule by mouth 2 (two) times daily., Disp: , Rfl:    traZODone  (DESYREL ) 50 MG tablet, Take 75 mg by mouth at bedtime., Disp: , Rfl:  [2]  Social History Tobacco Use  Smoking Status Never  Smokeless Tobacco Never  [3]  Allergies Allergen Reactions   Shrimp Extract Diarrhea and Nausea Only    shrimp allergenic extract   Silicone Dermatitis and Other (See Comments)    CPAP mask material breaks him out into a whelps and blisters.  silicones   Tape Dermatitis    Skin blistered- steri strip  Skin blistered   Wound Dressing Adhesive Dermatitis and Other (See Comments)    Skin blistered- steri strip  Adhesive agent (substance)   Shellfish Allergy Diarrhea and Nausea And Vomiting    Shrimp    Shellfish Protein-Containing Drug Products Diarrhea, Nausea And Vomiting and Nausea Only   Methenamine Diarrhea and Nausea And Vomiting   Onion Other (See Comments)    diarrhea Diarrhea- verified avoids all onion ingredient (including onion powder). MM, RD 12/19/20   Other Nausea And Vomiting    general anesthesia   Codeine  Nausea Only   "

## 2024-11-15 ENCOUNTER — Ambulatory Visit: Admitting: Oncology

## 2024-11-15 ENCOUNTER — Other Ambulatory Visit

## 2024-12-28 ENCOUNTER — Ambulatory Visit: Admitting: Dermatology
